# Patient Record
Sex: Female | Born: 1970 | Race: White | Hispanic: No | Marital: Married | Smoking: Never smoker
Health system: Southern US, Community
[De-identification: ages and names within clinical notes are randomized; demographics above are authoritative.]

## PROBLEM LIST (undated history)

## (undated) DIAGNOSIS — K219 Gastro-esophageal reflux disease without esophagitis: Secondary | ICD-10-CM

## (undated) DIAGNOSIS — G4730 Sleep apnea, unspecified: Secondary | ICD-10-CM

## (undated) DIAGNOSIS — M5136 Other intervertebral disc degeneration, lumbar region: Secondary | ICD-10-CM

## (undated) DIAGNOSIS — G8929 Other chronic pain: Secondary | ICD-10-CM

## (undated) DIAGNOSIS — B9689 Other specified bacterial agents as the cause of diseases classified elsewhere: Secondary | ICD-10-CM

## (undated) DIAGNOSIS — Z87828 Personal history of other (healed) physical injury and trauma: Secondary | ICD-10-CM

## (undated) DIAGNOSIS — M51369 Other intervertebral disc degeneration, lumbar region without mention of lumbar back pain or lower extremity pain: Secondary | ICD-10-CM

## (undated) DIAGNOSIS — I10 Essential (primary) hypertension: Secondary | ICD-10-CM

## (undated) DIAGNOSIS — M1990 Unspecified osteoarthritis, unspecified site: Secondary | ICD-10-CM

## (undated) DIAGNOSIS — F419 Anxiety disorder, unspecified: Secondary | ICD-10-CM

## (undated) DIAGNOSIS — E785 Hyperlipidemia, unspecified: Secondary | ICD-10-CM

## (undated) DIAGNOSIS — R87619 Unspecified abnormal cytological findings in specimens from cervix uteri: Secondary | ICD-10-CM

## (undated) DIAGNOSIS — F410 Panic disorder [episodic paroxysmal anxiety] without agoraphobia: Secondary | ICD-10-CM

## (undated) DIAGNOSIS — E119 Type 2 diabetes mellitus without complications: Secondary | ICD-10-CM

## (undated) DIAGNOSIS — N760 Acute vaginitis: Secondary | ICD-10-CM

## (undated) DIAGNOSIS — F319 Bipolar disorder, unspecified: Secondary | ICD-10-CM

## (undated) DIAGNOSIS — N840 Polyp of corpus uteri: Secondary | ICD-10-CM

## (undated) DIAGNOSIS — IMO0002 Reserved for concepts with insufficient information to code with codable children: Secondary | ICD-10-CM

## (undated) DIAGNOSIS — M549 Dorsalgia, unspecified: Secondary | ICD-10-CM

## (undated) DIAGNOSIS — F603 Borderline personality disorder: Secondary | ICD-10-CM

## (undated) DIAGNOSIS — I509 Heart failure, unspecified: Secondary | ICD-10-CM

## (undated) HISTORY — PX: CHOLECYSTECTOMY: SHX55

## (undated) HISTORY — DX: Other specified bacterial agents as the cause of diseases classified elsewhere: N76.0

## (undated) HISTORY — DX: Bipolar disorder, unspecified: F31.9

## (undated) HISTORY — DX: Borderline personality disorder: F60.3

## (undated) HISTORY — DX: Essential (primary) hypertension: I10

## (undated) HISTORY — PX: DILATION AND CURETTAGE OF UTERUS: SHX78

## (undated) HISTORY — DX: Heart failure, unspecified: I50.9

## (undated) HISTORY — DX: Polyp of corpus uteri: N84.0

## (undated) HISTORY — DX: Reserved for concepts with insufficient information to code with codable children: IMO0002

## (undated) HISTORY — DX: Type 2 diabetes mellitus without complications: E11.9

## (undated) HISTORY — PX: FRACTURE SURGERY: SHX138

## (undated) HISTORY — DX: Personal history of other (healed) physical injury and trauma: Z87.828

## (undated) HISTORY — PX: HARDWARE REMOVAL: SHX979

## (undated) HISTORY — DX: Other specified bacterial agents as the cause of diseases classified elsewhere: B96.89

## (undated) HISTORY — PX: APPENDECTOMY: SHX54

## (undated) HISTORY — DX: Panic disorder (episodic paroxysmal anxiety): F41.0

## (undated) HISTORY — DX: Unspecified abnormal cytological findings in specimens from cervix uteri: R87.619

## (undated) HISTORY — DX: Hyperlipidemia, unspecified: E78.5

---

## 2003-05-07 ENCOUNTER — Encounter: Payer: Self-pay | Admitting: Emergency Medicine

## 2003-05-07 ENCOUNTER — Emergency Department (HOSPITAL_COMMUNITY): Admission: EM | Admit: 2003-05-07 | Discharge: 2003-05-07 | Payer: Self-pay | Admitting: Internal Medicine

## 2003-05-07 ENCOUNTER — Emergency Department (HOSPITAL_COMMUNITY): Admission: EM | Admit: 2003-05-07 | Discharge: 2003-05-07 | Payer: Self-pay | Admitting: Emergency Medicine

## 2003-05-09 ENCOUNTER — Ambulatory Visit (HOSPITAL_COMMUNITY): Admission: RE | Admit: 2003-05-09 | Discharge: 2003-05-09 | Payer: Self-pay | Admitting: Emergency Medicine

## 2003-05-09 ENCOUNTER — Encounter: Payer: Self-pay | Admitting: Emergency Medicine

## 2003-05-11 ENCOUNTER — Emergency Department (HOSPITAL_COMMUNITY): Admission: EM | Admit: 2003-05-11 | Discharge: 2003-05-12 | Payer: Self-pay | Admitting: *Deleted

## 2003-07-14 ENCOUNTER — Inpatient Hospital Stay (HOSPITAL_COMMUNITY): Admission: AC | Admit: 2003-07-14 | Discharge: 2003-07-15 | Payer: Self-pay

## 2003-07-14 ENCOUNTER — Encounter: Payer: Self-pay | Admitting: Orthopedic Surgery

## 2003-07-14 ENCOUNTER — Encounter: Payer: Self-pay | Admitting: Emergency Medicine

## 2003-07-14 ENCOUNTER — Encounter: Payer: Self-pay | Admitting: General Surgery

## 2003-07-27 ENCOUNTER — Ambulatory Visit (HOSPITAL_COMMUNITY): Admission: RE | Admit: 2003-07-27 | Discharge: 2003-07-28 | Payer: Self-pay | Admitting: Orthopedic Surgery

## 2003-07-27 ENCOUNTER — Encounter: Payer: Self-pay | Admitting: Orthopedic Surgery

## 2003-08-09 ENCOUNTER — Ambulatory Visit (HOSPITAL_COMMUNITY): Admission: RE | Admit: 2003-08-09 | Discharge: 2003-08-09 | Payer: Self-pay | Admitting: Family Medicine

## 2003-08-09 ENCOUNTER — Encounter: Payer: Self-pay | Admitting: Family Medicine

## 2004-01-19 ENCOUNTER — Ambulatory Visit (HOSPITAL_COMMUNITY): Admission: RE | Admit: 2004-01-19 | Discharge: 2004-01-19 | Payer: Self-pay | Admitting: Family Medicine

## 2004-01-27 ENCOUNTER — Ambulatory Visit (HOSPITAL_COMMUNITY): Admission: RE | Admit: 2004-01-27 | Discharge: 2004-01-27 | Payer: Self-pay | Admitting: General Surgery

## 2004-03-20 ENCOUNTER — Ambulatory Visit (HOSPITAL_COMMUNITY): Admission: RE | Admit: 2004-03-20 | Discharge: 2004-03-20 | Payer: Self-pay | Admitting: Orthopedic Surgery

## 2004-06-01 ENCOUNTER — Ambulatory Visit (HOSPITAL_COMMUNITY): Admission: RE | Admit: 2004-06-01 | Discharge: 2004-06-01 | Payer: Self-pay | Admitting: Family Medicine

## 2004-10-09 ENCOUNTER — Ambulatory Visit: Payer: Self-pay | Admitting: Urgent Care

## 2004-10-16 ENCOUNTER — Ambulatory Visit: Payer: Self-pay | Admitting: Internal Medicine

## 2004-10-16 ENCOUNTER — Ambulatory Visit (HOSPITAL_COMMUNITY): Admission: RE | Admit: 2004-10-16 | Discharge: 2004-10-16 | Payer: Self-pay | Admitting: Internal Medicine

## 2004-12-20 ENCOUNTER — Ambulatory Visit (HOSPITAL_COMMUNITY): Admission: RE | Admit: 2004-12-20 | Discharge: 2004-12-20 | Payer: Self-pay | Admitting: *Deleted

## 2005-01-19 ENCOUNTER — Emergency Department (HOSPITAL_COMMUNITY): Admission: EM | Admit: 2005-01-19 | Discharge: 2005-01-19 | Payer: Self-pay | Admitting: Emergency Medicine

## 2005-03-05 ENCOUNTER — Ambulatory Visit (HOSPITAL_COMMUNITY): Admission: RE | Admit: 2005-03-05 | Discharge: 2005-03-05 | Payer: Self-pay | Admitting: Family Medicine

## 2005-03-25 ENCOUNTER — Ambulatory Visit (HOSPITAL_COMMUNITY): Admission: RE | Admit: 2005-03-25 | Discharge: 2005-03-25 | Payer: Self-pay | Admitting: Family Medicine

## 2005-04-10 ENCOUNTER — Ambulatory Visit: Payer: Self-pay | Admitting: Internal Medicine

## 2005-11-07 ENCOUNTER — Ambulatory Visit (HOSPITAL_COMMUNITY): Admission: RE | Admit: 2005-11-07 | Discharge: 2005-11-07 | Payer: Self-pay | Admitting: *Deleted

## 2005-11-27 ENCOUNTER — Ambulatory Visit: Payer: Self-pay | Admitting: *Deleted

## 2005-12-12 ENCOUNTER — Ambulatory Visit: Payer: Self-pay | Admitting: Family Medicine

## 2005-12-18 ENCOUNTER — Ambulatory Visit: Payer: Self-pay | Admitting: Obstetrics & Gynecology

## 2005-12-24 ENCOUNTER — Ambulatory Visit (HOSPITAL_COMMUNITY): Admission: RE | Admit: 2005-12-24 | Discharge: 2005-12-24 | Payer: Self-pay | Admitting: *Deleted

## 2006-01-08 ENCOUNTER — Ambulatory Visit: Payer: Self-pay | Admitting: Obstetrics & Gynecology

## 2006-01-15 ENCOUNTER — Ambulatory Visit (HOSPITAL_COMMUNITY): Admission: RE | Admit: 2006-01-15 | Discharge: 2006-01-15 | Payer: Self-pay | Admitting: *Deleted

## 2006-01-15 ENCOUNTER — Ambulatory Visit: Payer: Self-pay | Admitting: Obstetrics & Gynecology

## 2006-01-18 ENCOUNTER — Inpatient Hospital Stay (HOSPITAL_COMMUNITY): Admission: AD | Admit: 2006-01-18 | Discharge: 2006-01-19 | Payer: Self-pay | Admitting: *Deleted

## 2006-01-18 ENCOUNTER — Encounter: Payer: Self-pay | Admitting: Emergency Medicine

## 2006-01-18 ENCOUNTER — Ambulatory Visit: Payer: Self-pay | Admitting: Certified Nurse Midwife

## 2006-01-28 ENCOUNTER — Encounter: Admission: RE | Admit: 2006-01-28 | Discharge: 2006-01-28 | Payer: Self-pay | Admitting: Orthopedic Surgery

## 2006-01-30 ENCOUNTER — Ambulatory Visit: Payer: Self-pay | Admitting: Family Medicine

## 2006-01-30 ENCOUNTER — Ambulatory Visit (HOSPITAL_COMMUNITY): Admission: RE | Admit: 2006-01-30 | Discharge: 2006-01-30 | Payer: Self-pay | Admitting: *Deleted

## 2006-02-03 ENCOUNTER — Ambulatory Visit: Payer: Self-pay | Admitting: Obstetrics & Gynecology

## 2006-02-03 ENCOUNTER — Ambulatory Visit (HOSPITAL_COMMUNITY): Admission: RE | Admit: 2006-02-03 | Discharge: 2006-02-03 | Payer: Self-pay | Admitting: *Deleted

## 2006-02-10 ENCOUNTER — Ambulatory Visit (HOSPITAL_COMMUNITY): Admission: RE | Admit: 2006-02-10 | Discharge: 2006-02-10 | Payer: Self-pay | Admitting: *Deleted

## 2006-02-10 ENCOUNTER — Ambulatory Visit: Payer: Self-pay | Admitting: Family Medicine

## 2006-02-17 ENCOUNTER — Ambulatory Visit (HOSPITAL_COMMUNITY): Admission: RE | Admit: 2006-02-17 | Discharge: 2006-02-17 | Payer: Self-pay | Admitting: *Deleted

## 2006-02-17 ENCOUNTER — Ambulatory Visit: Payer: Self-pay | Admitting: Family Medicine

## 2006-02-20 ENCOUNTER — Inpatient Hospital Stay (HOSPITAL_COMMUNITY): Admission: AD | Admit: 2006-02-20 | Discharge: 2006-02-27 | Payer: Self-pay | Admitting: Family Medicine

## 2006-02-20 ENCOUNTER — Ambulatory Visit: Payer: Self-pay | Admitting: Obstetrics & Gynecology

## 2006-02-20 ENCOUNTER — Ambulatory Visit: Payer: Self-pay | Admitting: Family Medicine

## 2006-02-23 ENCOUNTER — Ambulatory Visit: Payer: Self-pay | Admitting: Pediatrics

## 2006-02-24 ENCOUNTER — Encounter (INDEPENDENT_AMBULATORY_CARE_PROVIDER_SITE_OTHER): Payer: Self-pay | Admitting: *Deleted

## 2006-03-03 ENCOUNTER — Ambulatory Visit: Payer: Self-pay | Admitting: *Deleted

## 2006-03-17 ENCOUNTER — Ambulatory Visit: Payer: Self-pay | Admitting: *Deleted

## 2007-11-28 IMAGING — US US OB COMP +14 WK
1 series · 13 of 28 positions shown · non-contrast
Comparison: none

CLINICAL DATA: Diabetes.  Morphology.

[Series 1: unknown · 0.26mm/px · 13 of 44 slices shown]
[im 2/44]
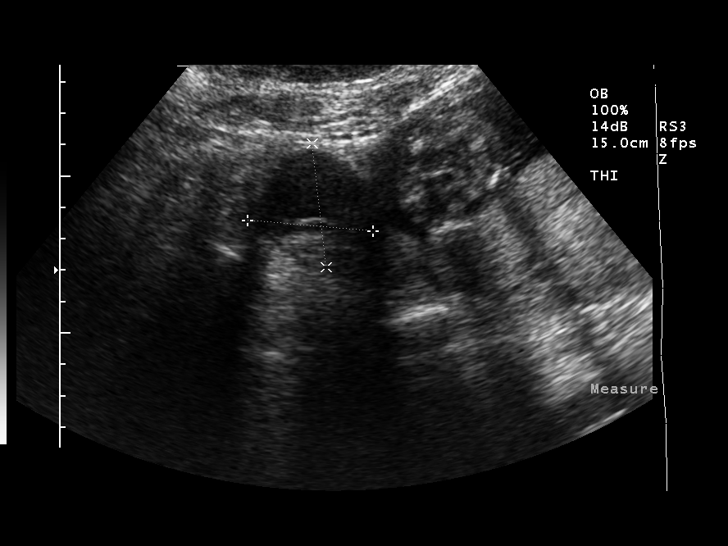
[im 5/44]
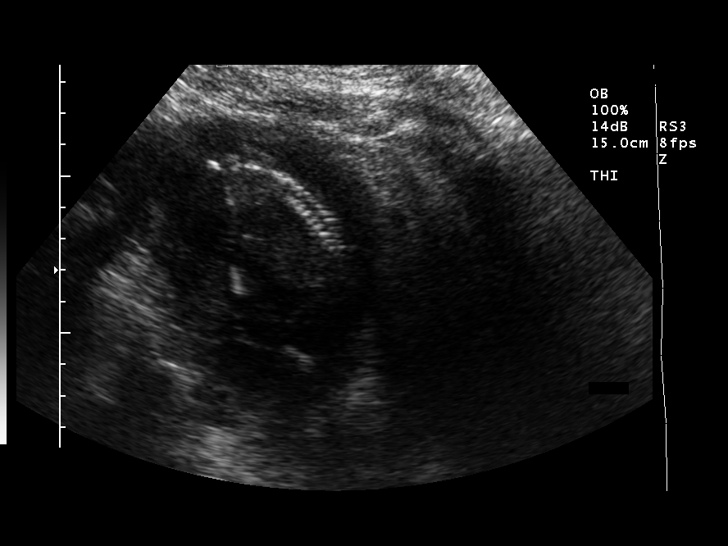
[im 8/44]
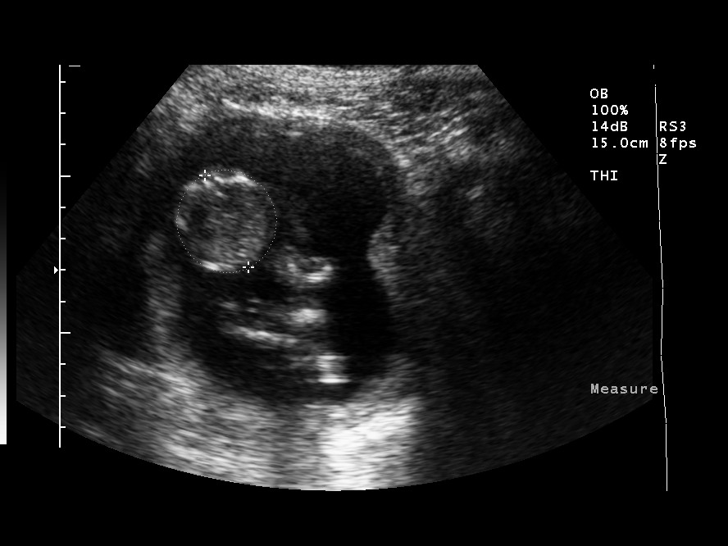
[im 12/44]
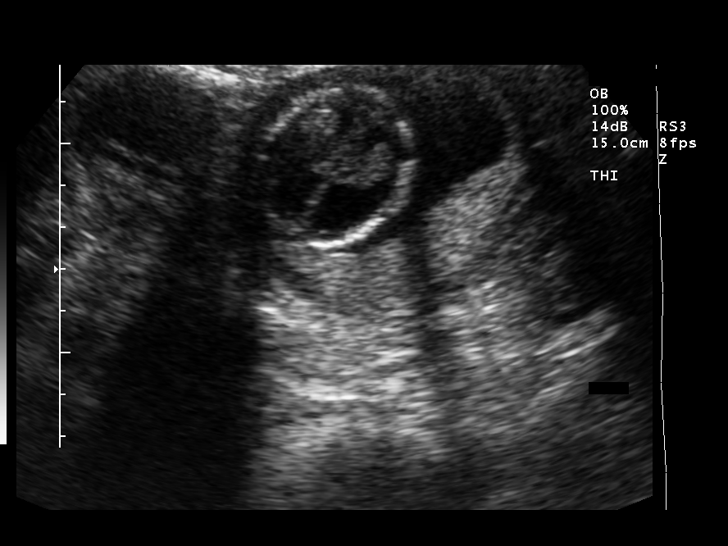
[im 15/44]
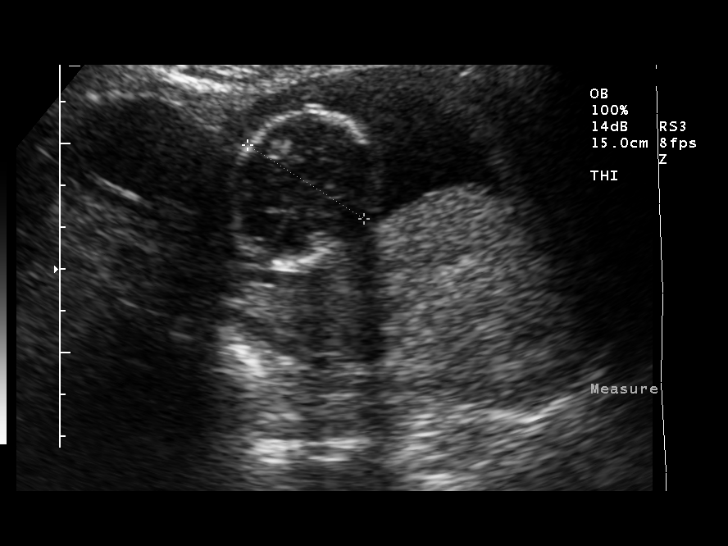
[im 18/44]
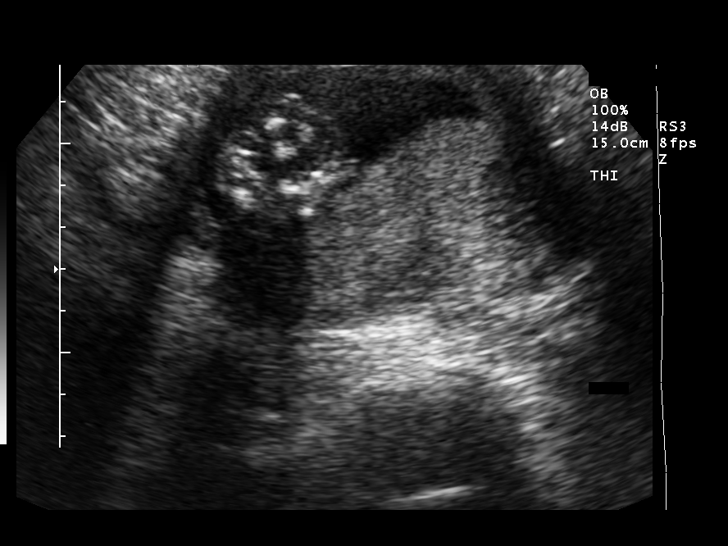
[im 23/44]
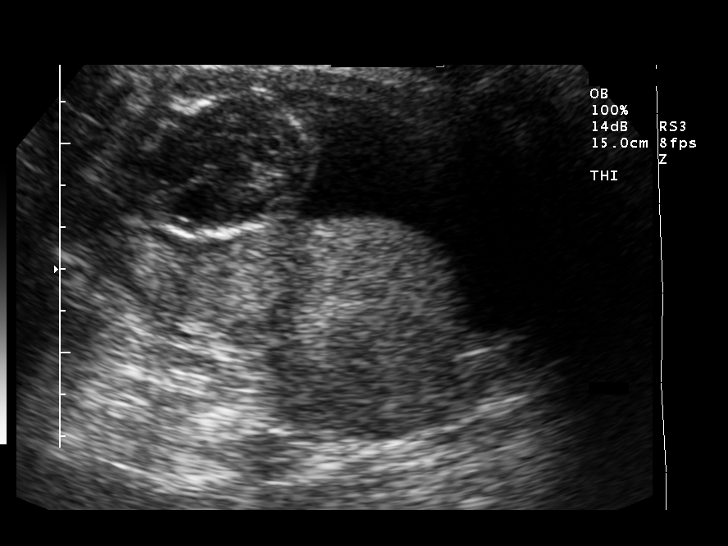
[im 26/44]
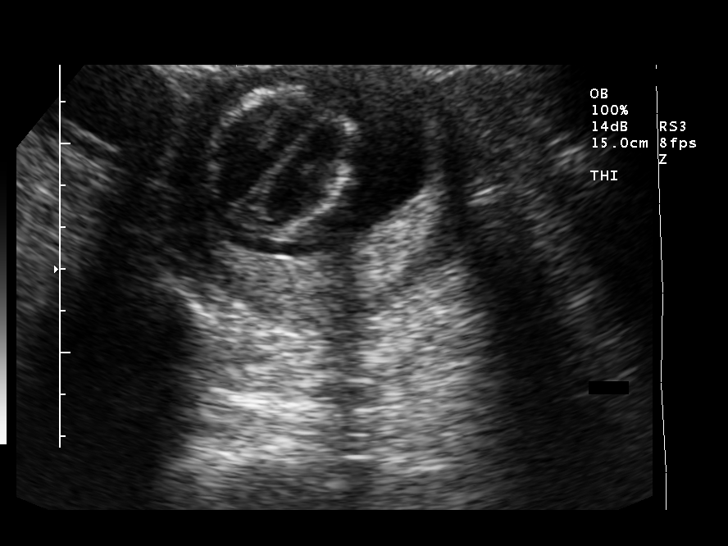
[im 29/44]
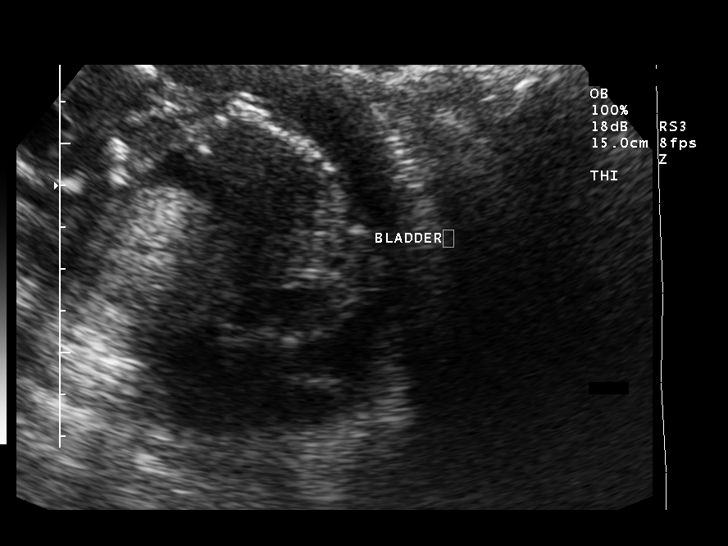
[im 32/44]
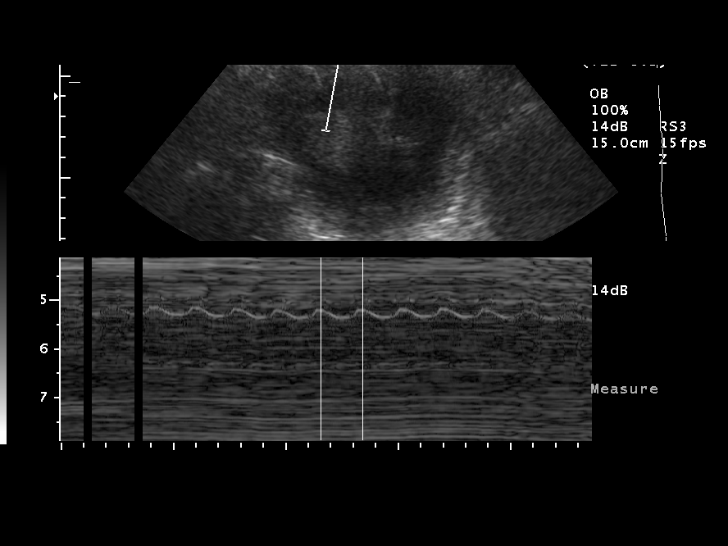
[im 36/44]
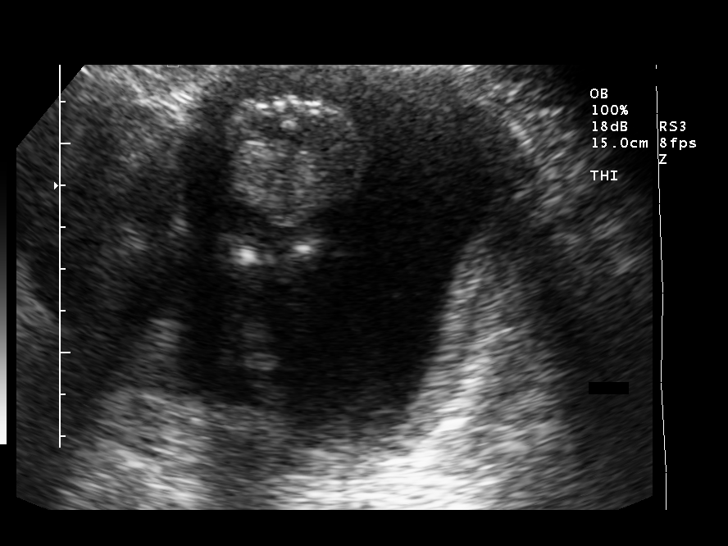
[im 39/44]
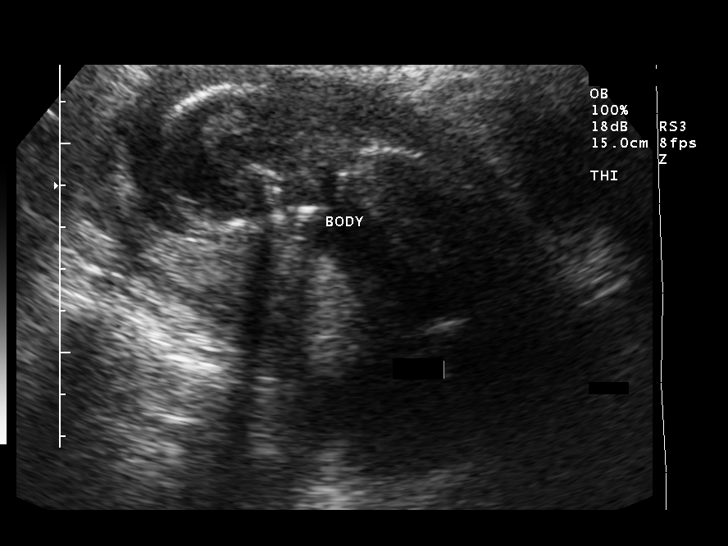
[im 42/44]
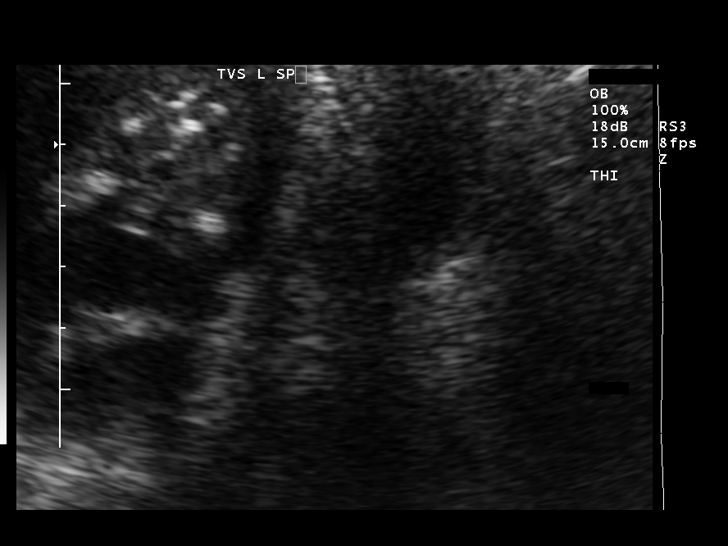

[13 of 28 positions shown; findings below may reference images not displayed]

OBSTETRICAL ULTRASOUND:
 Number of Fetuses:  1
 Heart Rate:  166
 Movement:  Yes
 Breathing:  No  
 Presentation:  Breech
 Placental Location:  Posterior
 Previa:  No
 Amniotic Fluid (Subjective):  Normal
 Amniotic Fluid (Objective):   7.3 cm Vertical pocket 

 FETAL BIOMETRY
 BPD:   3.2 cm   16 w 2 d
 HC:   12.1 cm   16 w 0 d
 AC:    9.9 cm  16 w 0 d
 FL:    2.0 cm  16 w 0 d

 MEAN GA:  16 w 1 d

 FETAL ANATOMY
 Lateral Ventricles:    Visualized 
 Thalami/CSP:      Visualized 
 Posterior Fossa:  Not visualized 
 Nuchal Region:    Not visualized 
 Spine:      Visualized 
 4 Chamber Heart on Left:      Not visualized 
 Stomach on Left:      Visualized 
 3 Vessel Cord:    Not visualized 
 Cord Insertion site:    Not visualized 
 Kidneys:  Visualized 
 Bladder:  Visualized 
 Extremities:      Not visualized 

 Evaluation limited by:  Maternal habitus, fetal position and early gestational age.

 MATERNAL UTERINE AND ADNEXAL FINDINGS
 Cervix: 3.3 cm Transabdominally
IMPRESSION: 1.  Single live intrauterine gestation in breech presentation.  Limited incomplete morphologic survey due to early gestational age, maternal body habitus, and fetal position.
 2.  Recommend follow-up ultrasound in four weeks to reassess fetal anatomy.
 3.  Bowel is upper normal to questioned minimally increased in echogenicity, can assess at follow-up study.  
 4.  Corpus luteal cyst right ovary 3.4 cm diameter.
 5.  Posterior placenta without previa.  
 6.  Contraction noted in the posterior wall of uterus during study.

## 2007-12-17 ENCOUNTER — Inpatient Hospital Stay (HOSPITAL_COMMUNITY): Admission: EM | Admit: 2007-12-17 | Discharge: 2007-12-19 | Payer: Self-pay | Admitting: Emergency Medicine

## 2008-04-01 ENCOUNTER — Other Ambulatory Visit: Admission: RE | Admit: 2008-04-01 | Discharge: 2008-04-01 | Payer: Self-pay | Admitting: Obstetrics and Gynecology

## 2008-11-15 ENCOUNTER — Other Ambulatory Visit: Admission: RE | Admit: 2008-11-15 | Discharge: 2008-11-15 | Payer: Self-pay | Admitting: Obstetrics and Gynecology

## 2010-12-22 ENCOUNTER — Encounter: Payer: Self-pay | Admitting: Family Medicine

## 2010-12-23 ENCOUNTER — Encounter: Payer: Self-pay | Admitting: *Deleted

## 2011-04-09 ENCOUNTER — Emergency Department (HOSPITAL_COMMUNITY): Payer: Self-pay

## 2011-04-09 ENCOUNTER — Emergency Department (HOSPITAL_COMMUNITY)
Admission: EM | Admit: 2011-04-09 | Discharge: 2011-04-09 | Disposition: A | Discharge status: Nursing Home (Includes ICF) | Payer: Self-pay | Attending: Emergency Medicine | Admitting: Emergency Medicine

## 2011-04-09 DIAGNOSIS — S5010XA Contusion of unspecified forearm, initial encounter: Secondary | ICD-10-CM | POA: Insufficient documentation

## 2011-04-09 DIAGNOSIS — K219 Gastro-esophageal reflux disease without esophagitis: Secondary | ICD-10-CM | POA: Insufficient documentation

## 2011-04-09 DIAGNOSIS — E7800 Pure hypercholesterolemia, unspecified: Secondary | ICD-10-CM | POA: Insufficient documentation

## 2011-04-09 DIAGNOSIS — F411 Generalized anxiety disorder: Secondary | ICD-10-CM | POA: Insufficient documentation

## 2011-04-09 DIAGNOSIS — S52209A Unspecified fracture of shaft of unspecified ulna, initial encounter for closed fracture: Secondary | ICD-10-CM | POA: Insufficient documentation

## 2011-04-09 DIAGNOSIS — S2249XA Multiple fractures of ribs, unspecified side, initial encounter for closed fracture: Secondary | ICD-10-CM | POA: Insufficient documentation

## 2011-04-09 DIAGNOSIS — E119 Type 2 diabetes mellitus without complications: Secondary | ICD-10-CM | POA: Insufficient documentation

## 2011-04-09 DIAGNOSIS — Z794 Long term (current) use of insulin: Secondary | ICD-10-CM | POA: Insufficient documentation

## 2011-04-09 DIAGNOSIS — S42413A Displaced simple supracondylar fracture without intercondylar fracture of unspecified humerus, initial encounter for closed fracture: Secondary | ICD-10-CM | POA: Insufficient documentation

## 2011-04-09 DIAGNOSIS — IMO0002 Reserved for concepts with insufficient information to code with codable children: Secondary | ICD-10-CM | POA: Insufficient documentation

## 2011-04-09 DIAGNOSIS — S82109A Unspecified fracture of upper end of unspecified tibia, initial encounter for closed fracture: Secondary | ICD-10-CM | POA: Insufficient documentation

## 2011-04-09 DIAGNOSIS — M79609 Pain in unspecified limb: Secondary | ICD-10-CM | POA: Insufficient documentation

## 2011-04-09 DIAGNOSIS — G319 Degenerative disease of nervous system, unspecified: Secondary | ICD-10-CM | POA: Insufficient documentation

## 2011-04-09 DIAGNOSIS — S72499A Other fracture of lower end of unspecified femur, initial encounter for closed fracture: Secondary | ICD-10-CM | POA: Insufficient documentation

## 2011-04-09 DIAGNOSIS — I10 Essential (primary) hypertension: Secondary | ICD-10-CM | POA: Insufficient documentation

## 2011-04-09 DIAGNOSIS — S82409A Unspecified fracture of shaft of unspecified fibula, initial encounter for closed fracture: Secondary | ICD-10-CM | POA: Insufficient documentation

## 2011-04-09 DIAGNOSIS — M7989 Other specified soft tissue disorders: Secondary | ICD-10-CM | POA: Insufficient documentation

## 2011-04-09 DIAGNOSIS — S82843A Displaced bimalleolar fracture of unspecified lower leg, initial encounter for closed fracture: Secondary | ICD-10-CM | POA: Insufficient documentation

## 2011-04-09 DIAGNOSIS — IMO0001 Reserved for inherently not codable concepts without codable children: Secondary | ICD-10-CM | POA: Insufficient documentation

## 2011-04-09 LAB — COMPREHENSIVE METABOLIC PANEL
BUN: 17 mg/dL (ref 6–23)
Chloride: 94 mEq/L — ABNORMAL LOW (ref 96–112)
GFR calc Af Amer: >60 mL/min (ref >60)
Potassium: 4.5 mEq/L (ref 3.5–5.1)

## 2011-04-09 LAB — PROTIME-INR
INR: 0.93 (ref 0.00–1.49)
Prothrombin Time: 12.7 seconds (ref 11.6–15.2)

## 2011-04-09 LAB — POCT I-STAT, CHEM 8
BUN: 18 mg/dL (ref 6–23)
Glucose, Bld: 580 mg/dL (ref 70–99)
HCT: 42 % (ref 36.0–46.0)
Hemoglobin: 14.3 g/dL (ref 12.0–15.0)
Sodium: 131 mEq/L — ABNORMAL LOW (ref 135–145)
TCO2: 23 mmol/L (ref 0–100)

## 2011-04-09 LAB — GLUCOSE, CAPILLARY

## 2011-04-09 LAB — CBC
Hemoglobin: 13.6 g/dL (ref 12.0–15.0)
RBC: 4.79 MIL/uL (ref 3.87–5.11)
WBC: 22.2 10*3/uL — ABNORMAL HIGH (ref 4.0–10.5)

## 2011-04-09 LAB — LACTIC ACID, PLASMA: Lactic Acid, Venous: 4.3 mmol/L — ABNORMAL HIGH (ref 0.5–2.2)

## 2011-04-09 LAB — SAMPLE TO BLOOD BANK

## 2011-04-09 MED ORDER — IOHEXOL 300 MG/ML  SOLN
100.0000 mL | Freq: Once | INTRAMUSCULAR | Status: AC | PRN
  Administered 2011-04-09: 100 mL via INTRAVENOUS

## 2011-04-11 LAB — GLUCOSE, CAPILLARY: Glucose-Capillary: 420 mg/dL — ABNORMAL HIGH (ref 70–99)
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
## 2011-04-16 NOTE — Group Therapy Note (Signed)
NAMETATJANA, Yolanda [name]                ACCOUNT NO.:  [id number]   MEDICAL RECORD NO.:  [id number]          PATIENT TYPE:  INP   LOCATION:  A208                          FACILITY:  [facility]   PHYSICIAN:  Skeet Latch, DO    DATE OF BIRTH:  1971/11/13   DATE OF PROCEDURE:  12/18/2007  DATE OF DISCHARGE:                                 PROGRESS NOTE   SUBJECTIVE:  Yolanda [name] states that she is feeling better today.  Patient is not complaining about any dysuria or abdominal pain or nausea  or vomiting.  Patient is sitting in bed, comfortable, in no acute  distress.   OBJECTIVE:  VITAL SIGNS:  Temperature is 101.8, pulse 122, respirations  20, blood pressure is 116/67.  She is satting 94% on room air.   PHYSICAL EXAM:  GENERAL:  She is a 40 year old Caucasian female, well-  developed, well-nourished, well-hydrated, in no acute distress.  She is  alert and oriented times three, pleasant, cooperative.  CARDIOVASCULAR:  She is tachycardic, no murmurs appreciated, normal S1,  S2.  LUNGS:  Clear to auscultation bilaterally, no rales, rhonchi or  wheezing.  ABDOMEN:  Obese, soft, slightly tender in mid-abdomen, no rigidity or  guarding.  EXTREMITIES:  No clubbing, cyanosis or edema.   LABORATORIES:  White count is 10.4, hemoglobin 10.2, hematocrit 30.3,  platelets 209.  Sodium 137, potassium 3.3, chloride is 103, CO2 is 24  and glucose 75, BUN 40, creatinine is 1.25.   ASSESSMENT:  1. Urinary tract infection with possible sepsis.  2. Acute renal failure, secondary to dehydration.  3. Hypokalemia.  4. Sleep apnea.  5. Diabetes.   PLAN:  1. For UTI, continue patient on IV ceftriaxone at this time, pending      culture results.  2. For acute renal failure, seems to be improving.  Probably secondary      to dehydration.  Continue with IV hydration at this time.  3. For hypokalemia, she is slightly improved today; will continue to      replace.  4. For sleep apnea, patient is on CPAP at  night.  5. For diabetes, patient is on a sliding scale, as well as Lantus.  We      will still hold her Actos and her home oral diabetic medication      until patient has regular diabetic diet.   Anticipate patient's being discharged in the next few days, if she is  stable and afebrile.      Skeet Latch, DO  Electronically Signed     SM/MEDQ  D:  12/18/2007  T:  12/18/2007  Job:  [id number]

## 2011-04-16 NOTE — Discharge Summary (Signed)
Yolanda [name], Yolanda [name]                ACCOUNT NO.:  [id number]   MEDICAL RECORD NO.:  [id number]          PATIENT TYPE:  INP   LOCATION:  A208                          FACILITY:  [facility]   PHYSICIAN:  Dorris Singh, DO    DATE OF BIRTH:  1971/10/06   DATE OF ADMISSION:  12/17/2007  DATE OF DISCHARGE:  01/17/2009LH                               DISCHARGE SUMMARY   ADMISSION DIAGNOSES:  1. Urinary tract infection with sepsis and potential septic shock.  2. Insulin-dependent diabetes.  3. Morbid obesity.  4. History of hypertension.  5. History of sleep apnea.   DISCHARGE DIAGNOSES:  1. Urinary tract infection, which is resolving.  2. Insulin-dependent diabetes.  3. Morbid obesity.  4. History of hypertension.  5. History of sleep apnea.   There were no consults made.   PRIMARY CARE PHYSICIAN:  Dr. Nobie Putnam   RADIOLOGY REPORTS:  On January 16, a CT of abdomen and pelvis:  CT is  unremarkable of the pelvis and CT of the abdomen is unremarkable.   Her history and physical was done by Dr. Rito Ehrlich.  To summarize, this  is a 40 year old Caucasian female who noticed increase of urine with  pain on urination and abdominal pain.  She was then brought to [facility] where she was evaluated and found to have a fever after 3 days of  poor oral intake and pyrexia, and she was found to be hypotensive and  tachycardic.  The patient was found to have a urinary tract infection.  Also, it was significant to note that she is allergic to CIPRO and  SULFA.  At that point in time she was started on IV hydration and a CT  was obtained.  They put her on CPAP at night and they continued her on  her Lantus and sliding scale and checked her hemoglobin A1c, and took  her off of her Lantus.  The patient continued to improve without any  problems or difficulties.  Her white count trended down.  Also, she  maintained on IV hydration and on January 17, the patient was seen today  eating, stating that she  feels well, would like to go home.  She was  seen in the room with her husband and her child.  On physical exam, her  symptoms are improved, her vital signs are stable.  Her labs are  reviewed.  Her white count is normal and her kidney function is normal.  At this point in time the patient will be discharged.  Her condition is  stable.   She will be discharged to home on the following medications:  1. Lantus 87 units subcu two times daily.  2. NovoLog sliding scale.  3. Actos 45 mg daily.  4. Lisinopril, no dose given.  5. Lipitor, no dose given.  6. Prevacid, no dose given.  7. Aspirin 81 mg.  8. Fish oil.   Her new medication will be Macrodantin 100 mg p.o. q.6h. x7 days.   She is to follow up with Dr. Nobie Putnam in 3-5 days and she is to increase  her fluid.  She is to continue on a diabetic diet and take the  medication as recommended and increase her activity as tolerated.      Dorris Singh, DO  Electronically Signed     CB/MEDQ  D:  12/19/2007  T:  12/19/2007  Job:  [id number]
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
## 2011-04-19 NOTE — Op Note (Signed)
Yolanda [name], Yolanda [name]                ACCOUNT NO.:  [id number]   MEDICAL RECORD NO.:  [id number]          PATIENT TYPE:  AMB   LOCATION:  DAY                           FACILITY:  [facility]   PHYSICIAN:  R. Roetta Sessions, M.D. DATE OF BIRTH:  06-04-1971   DATE OF PROCEDURE:  10/16/2004  DATE OF DISCHARGE:                                 OPERATIVE REPORT   PROCEDURE:  Colonoscopy, ileoscopy, with diagnostic stool sampling.   INDICATION FOR PROCEDURE:  The patient is a 40 year old lady with diarrhea  and Hemoccult-positive stools.  Colonoscopy is now being done.  The approach  has been discussed with the patient at length.  Potential risks, benefits,  and alternatives have been reviewed.  Please see dictated consultation note  for more information.   PROCEDURE NOTE:  O2 saturation, blood pressure, pulse, and respirations were  monitored throughout the entire procedure.   CONSCIOUS SEDATION:  Versed 4 mg IV, Demerol 75 mg IV in divided doses.   INSTRUMENT USED:  Olympus video chip system.   FINDINGS:  Digital exam revealed no abnormalities.   Endoscopic findings:  Prep was good.   Rectum:  Examination of the rectal mucosa including retroflexed view of the  anal verge revealed only some internal hemorrhoids.  The rectal mucosa  appeared normal.   Colon:  The colonic mucosa was surveyed from the rectosigmoid junction  through the left, transverse, and right colon to the area of the appendiceal  orifice, ileocecal valve, and cecum.  These structures were well-seen and  photographed for the record.  The terminal ileum was then intubated to 10  cm.  Please see photos.  From this level the scope was slowly withdrawn.  All previously-mentioned mucosal surfaces were again seen.  The colonic  mucosa appeared normal.  Stool samples taken for microbiology studies.  The  patient tolerated the procedure well, was reacted in endoscopy.   IMPRESSION:  Internal hemorrhoids, otherwise normal rectum,  normal colon,  normal terminal ileum.  Stool sample taken.   RECOMMENDATIONS:  1.  Hemorrhoid literature provided to [name].  2.  A 10-day course of Anusol-HC suppositories one per rectum at bedtime.  3.  Follow up on stool studies.  4.  Further recommendations to follow.     Otelia Sergeant   RMR/MEDQ  D:  10/16/2004  T:  10/17/2004  Job:  [id number]   cc:   Patrica Duel, M.D.  [address]  [place]  [place] [address]  Fax: [phone]

## 2011-04-19 NOTE — Op Note (Signed)
[name], Yolanda [name]                ACCOUNT NO.:  [id number]   MEDICAL RECORD NO.:  [id number]          PATIENT TYPE:  INP   LOCATION:  9303                          FACILITY:  [facility]   PHYSICIAN:  Conni Elliot, M.D.DATE OF BIRTH:  02-Sep-1971   DATE OF PROCEDURE:  02/24/2006  DATE OF DISCHARGE:                                 OPERATIVE REPORT   PREOPERATIVE DIAGNOSES:  1.  Thirty-one and four-sevenths weeks' intrauterine pregnancy.  2.  Severe preeclampsia.  3.  Breech presentation.   POSTOPERATIVE DIAGNOSES:  1.  Thirty-one and four-sevenths weeks' intrauterine pregnancy.  2.  Severe preeclampsia.  3.  Breech presentation.   PROCEDURE:  Primary low transverse cesarean section via Pfannenstiel.   SURGEON:  Conni Elliot, M.D.   ASSISTANTKaroline Caldwell B. Merlene Morse, M.D.   ANESTHESIA:  Spinal.   COMPLICATIONS:  None.   ESTIMATED BLOOD LOSS:  400 mL.   FLUIDS:  1200 mL of LR.   URINE OUTPUT:  100 mL of clear urine at the end of the procedure.   INDICATIONS:  A 40 year old G2, P0-0-1-0, at 31-4/7 weeks with severe  preeclampsia and breech presentation.   FINDINGS:  Female infant in breech presentation.  Pediatrics present at  delivery.  Apgars 7 and 9.  Weight 2 pounds 11 ounces.  Normal uterus,  tubes, and ovaries.  An omental-abdominal wall adhesion was reduced.   PROCEDURE:  The patient was taken to the operating room, where a spinal  anesthesia was obtained and found to be adequate.  She was then prepped and  draped in the normal sterile fashion in the dorsal supine position with a  leftward tilt.  A Pfannenstiel skin incision was then made with a scalpel  and carried to the underlying layer of fascia.  The fascia was incised in  the midline and the incision extended bilaterally with the Mayo scissors.  The superior aspect of the fascial incision was then grasped with a Kocher  clamp, elevated and the underlying layer was dissected off bluntly.  Attention was then  turned to the inferior aspect of the incision, which in a  similar fashion was grasped, tented up with a Kocher clamp, and the rectus  muscle was dissected off.  The rectus muscle was then separated in the  midline and the peritoneum was identified, tented up and entered sharply  with the Metzenbaum scissors.  Then the peritoneal incision was then  extended superiorly and inferiorly with good visualization of the bladder.  The bladder blade was then inserted and the vesicouterine peritoneum  identified, grasped with the pick-ups and entered sharply with the  Metzenbaum scissors.  The incision was then extended laterally and the  bladder flap created digitally.  The bladder blade was then reinserted and  the lower uterine segment incised in a transverse fashion.  The uterine  incision was then extended laterally and the bladder blade was removed and  the infant was delivered via frank breech presentation, no head entrapment.  The nose and mouth were bulb-suctioned, cord clamped and cut, infant handed  off to the waiting pediatricians.  Cord blood was sent, cord pH was 7.33.  The placenta was then removed manually, uterus exteriorized and cleared of  all clots and debris.  The uterus was repaired in a running locked fashion.  A second layer of the same suture was used to imbricate the incision.  The  bladder flap was repaired with running suture, the uterus returned to the  abdomen, the gutters cleared of all clots.  The peritoneum was closed after  an omental-abdominal wall adhesion was lysed and tied off.  The fascia was  reapproximated in a running fashion and the subcutaneous tissue was  reapproximated.  The skin was closed with staples.  The patient tolerated  the procedure well.  The sponge, lap and needle count were correct x2.  Ancef 1 g was given at cord clamp.  The patient was taken to the recovery  room in stable condition.     ______________________________  August Saucer Merlene Morse,  MD    ______________________________  Conni Elliot, M.D.    ABC/MEDQ  D:  02/24/2006  T:  02/26/2006  Job:  [id number]

## 2011-04-19 NOTE — Discharge Summary (Signed)
NAMEBRYSON, PALEN                ACCOUNT NO.:  [id number]   MEDICAL RECORD NO.:  [id number]          PATIENT TYPE:  INP   LOCATION:  WOC                           FACILITY:  [facility]   PHYSICIAN:  Angie B. Merlene Morse, MD  DATE OF BIRTH:  [birth year]   DATE OF ADMISSION:  02/20/2006  DATE OF DISCHARGE:  02/27/2006                                 DISCHARGE SUMMARY   ADMITTING ATTENDING:  Dr. Shawnie Pons.   DISCHARGING ATTENDING:  Dr. Shawnie Pons.   ADMITTING DIAGNOSES:  1.  A 31-week intrauterine pregnancy.  2.  Chronic hypertension with probable superimposed preeclampsia.  3.  Sleep apnea.  4.  Type 2 diabetes.  5.  Abnormal Dopplers.  6.  Breech presentation.  7.  Decreased fetal growth.   BRIEF HISTORY AND PHYSICAL:  The patient is a 40 year old, G2, P0-0-1-0, at  69 and 1 by her LMP, who has a history of chronic hypertension, who had  elevated blood pressures above baseline in the clinic.  Blood pressures were  161/103, 171/98 and 171/100.  The patient also has a history of chronic  hypertension, sleep apnea, class B diabetes, obesity, bipolar, PCOS, fetus  falling off growth curve, abnormal Dopplers and a low AFI.  The patient's  ultrasound showed an estimated fetal weight of 10-25th percentile with  previous being 25-50th percentile, AFI of 9.4, umbilical artery Doppler 6,  normal MCA and BPP of 8/8.   MEDICATIONS:  Aldomet, labetalol, Prilosec and insulin.   ALLERGIES:  SULFA AND CIPRO.   OBSTETRIC HISTORY:  SAB in January 2006, missed, with a D&C.   MEDICAL HISTORY:  As above.  Recent right broken foot.   SURGERY:  D&C, appendectomy, cholecystectomy, left knee surgery and rotator  cuff surgery.   LABORATORY PANEL:  O positive, antibody negative, hemoglobin 12.6 and  platelets 273, rubella immune, hepatitis B negative, syphilis nonreactive  and HIV negative.   PHYSICAL EXAMINATION:  GENERAL:  Gravid female.  Right foot in walking boot.  Neurologically intact.   HOSPITAL COURSE:   The patient was admitted and she was kept on bedrest.  Her  blood pressures were monitored and remained controlled on her home  medications, and returned closer to the patient's baseline.  The patient did  have a 24-hour urine that had been done as an outpatient, which resulted  while in the hospital, and was found to be 120.  The patient had a second 24-  hour urine done while in the hospital, which showed a total protein of too  low to be calculated.  Ultrasound was repeated and her AFI was stable.  Her  umbilical artery Doppler improved.  The patient did receive 2 doses of  betamethasone.  On Monday, March 26, the patient complained of a headache  with visual changes.  So, at that point it was determined that the patient  needed to be delivered on the basis of severe preeclampsia.  Please see op  note for more information.  The patient remained stable after delivery.  Her  blood pressure was controlled with hydrochlorothiazide, lisinopril and  labetalol.  The  patient was DC'd home.  Insulin was cut approximately in  half and patient was started back on Glucophage and Glyburide.  The patient  was started on magnesium when she began complaining of a headache, and  patient was continued on magnesium for more than 24 hours postpartum, and  had good diuresis.   DISCHARGE INSTRUCTIONS:  The patient was discharged to home in stable  condition.  She was to follow up with Dr. Talmage Nap as scheduled and Dr. Nash Dimmer,  orthopedist, as scheduled.   DISCHARGE MEDICINES:  1.  Labetalol 100 twice a day.  2.  Hydrochlorothiazide 25 once a day.  3.  Lisinopril 20 once a day.  4.  Glyburide 5 twice a day.  5.  Glucophage 1000 twice a day.  6.  Prilosec 40 twice a day.  7.  Humulin-N 25 q.p.m. and 22 q.a.m.  8.  Sliding scale 1 unit of NovoLog insulin for 30 g of carb.   The patient is also to follow up with high risk clinic in 6 weeks.           ______________________________  August Saucer Merlene Morse,  MD     ABC/MEDQ  D:  02/27/2006  T:  02/27/2006  Job:  [id number]

## 2011-04-19 NOTE — Op Note (Signed)
NAME:  Yolanda [name], Yolanda [name]                          ACCOUNT NO.:  [id number]   MEDICAL RECORD NO.:  [id number]                   PATIENT TYPE:  AMB   LOCATION:  DAY                                  FACILITY:  [facility]   PHYSICIAN:  Dalia Heading, M.D.               DATE OF BIRTH:  04/18/1971   DATE OF PROCEDURE:  DATE OF DISCHARGE:                                 OPERATIVE REPORT   PREOPERATIVE DIAGNOSIS:  Chronic cholecystitis.   POSTOPERATIVE DIAGNOSIS:  Chronic cholecystitis.   PROCEDURE:  Laparoscopic cholecystectomy   SURGEON:  Dalia Heading, M.D.   ANESTHESIA:  General endotracheal.   INDICATIONS:  The patient is a 40 year old white female who presents with  chronic cholecystitis.  The risks and benefits of the procedure including  bleeding, infection, hepatobiliary injury, and the possibility of an open  procedure were fully explained to the patient, who gave informed consent.   DESCRIPTION OF PROCEDURE:  The patient was placed in the supine position.  After induction of general endotracheal anesthesia, the abdomen was prepped  and draped using the usual sterile technique with Betadine.  Surgical site  confirmation was performed.   An infraumbilical incision was made down to the fascia.  A Veress needle was  introduced into the abdominal cavity and confirmation of placement was done  using the saline drop test.  The abdomen was then insufflated to 16 mmHg  pressure.  An 11-mm trocar was introduced into the abdominal cavity under  direct visualization without difficulty.  The patient was placed in reverse  Trendelenburg position and an additional 11-mm trocar was placed in the  epigastric region and 5-mm trocars were placed in the right upper quadrant  and right flank regions. The liver was inspected and noted to be within  normal limits.   The gallbladder was retracted superiorly and laterally.  The dissection was  begun around the infundibulum of the gallbladder.  The  cystic duct was first  identified.  Its juncture to the infundibulum fully identified.  Endoclips  were placed proximally and distally on the cystic duct; and the cystic duct  was divided.  This was likewise done on the cystic artery.  The gallbladder  was then freed away from the gallbladder fossa using Bovie electrocautery.  The gallbladder was delivered through the epigastric trocar site using an  EndoCatch bag.  The gallbladder fossa was inspected and no abnormal bleeding  or bile leakage was noted.  Surgicel was placed in the gallbladder fossa.  All fluid and air were then evacuated from the abdominal cavity prior to  removal of the trocars.   All wounds were irrigated with normal saline.  All wounds were injected with  0.5% Sensorcaine.  The supraumbilical fascia was reapproximated using an #0  Vicryl interrupted suture.  All skin incisions were closed using staples.  Betadine ointment and dry sterile dressings were applied.   All tape  and needle counts correct at the end of the procedure.  The patient  was extubated in the operating room and went back to recovery room in awake  and stable condition.   COMPLICATIONS:  None.   SPECIMEN:  Gallbladder.   BLOOD LOSS:  Minimal.      ___________________________________________                                            Dalia Heading, M.D.   MAJ/MEDQ  D:  01/27/2004  T:  01/27/2004  Job:  [id number]   cc:   Dalia Heading, M.D.  [address], [place]  [place] [address]  Fax: [phone]   Patrica Duel, M.D.  [address]  [place]  [place] [address]  Fax: [phone]

## 2011-04-19 NOTE — Op Note (Signed)
NAME:  Yolanda [name], Yolanda [name]                          ACCOUNT NO.:  [id number]   MEDICAL RECORD NO.:  [id number]                   PATIENT TYPE:  AMB   LOCATION:  DAY                                  FACILITY:  [facility]   PHYSICIAN:  Almedia Balls. Ranell Patrick, M.D.              DATE OF BIRTH:  1971/04/16   DATE OF PROCEDURE:  03/20/2004  DATE OF DISCHARGE:                                 OPERATIVE REPORT   PREOPERATIVE DIAGNOSIS:  Right shoulder retained hardware.   POSTOPERATIVE DIAGNOSIS:  Right shoulder retained hardware.   PROCEDURE PERFORMED:  Right shoulder hardware removal, deep.   ATTENDING SURGEON:  Almedia Balls. Ranell Patrick, M.D.   ANESTHESIA:  General.   ESTIMATED BLOOD LOSS:  Minimal.   FLUID REPLACEMENT:  500 mL crystalloid.   INSTRUMENT COUNT:  Correct.   COMPLICATIONS:  None.  Perioperative antibiotics were given.   INDICATIONS:  The patient is a 40 year old female, who sustained a  comminuted and displaced proximal humerus fracture for which she was treated  with closed reduction and percutaneous cannulated screw fixation in the  past.  The patient has had an uneventful union of her fracture and presents  now with retained hardware that continues to be irritating her deltoid  insertion.  The patient desires hardware removal.  Risks and benefits were  discussed; the patient would like to proceed.  Informed consent obtained.   DESCRIPTION OF PROCEDURE:  After an adequate level of anesthesia was  achieved, the patient was positioned in the modified beach chair position.  Using a Schlein holder, all neurovascular structures were padded  appropriately.  The right shoulder was sterilely prepped and draped.  The  patient had two prior percutaneous stab incisions over the lateral deltoid.  These were extended in one incision measuring about 1.5 cm in length.  The  skin was preinfiltrated with 0.25% Marcaine with epinephrine.  __________  incision with a 10 blade scalpel and development of a  subcutaneous interval  using the Bovie electrocautery.  The deltoid was divided in line with its  fibers over the course of about 1.5 cm just large enough to get a finger in.  We then spread bluntly down using Mayo scissors in line with deltoid fibers  to the hardware which was palpable.  Two 4.5 cannulated screws were removed  using screwdriver without event.  The wounds were thoroughly irrigated using  double-antibiotic irrigation followed  by closure with 2-0 Vicryl for the deltoid interval and 2-0 Vicryl  subcutaneous with buried sutures for the subcutaneous closure and running 3-  0 Monocryl for the skin with Steri-Strips and a sterile dressing, shoulder  sling.  The patient tolerated the procedure well.  Almedia Balls. Ranell Patrick, M.D.    SRN/MEDQ  D:  03/20/2004  T:  03/21/2004  Job:  [id number]

## 2011-04-19 NOTE — H&P (Signed)
NAME:  Yolanda [name], Yolanda [name]                          ACCOUNT NO.:  [id number]   MEDICAL RECORD NO.:  [id number]                   PATIENT TYPE:  OUT   LOCATION:  RAD                                  FACILITY:  [facility]   PHYSICIAN:  Dalia Heading, M.D.               DATE OF BIRTH:  1971-09-14   DATE OF ADMISSION:  DATE OF DISCHARGE:                                HISTORY & PHYSICAL   CHIEF COMPLAINT:  Chronic cholecystitis.   HISTORY OF PRESENT ILLNESS:  The patient is a 40 year old white female who  was referred for evaluation and treatment of cholecystitis secondary to  chronic cholecystitis.  She has been  having intermittent episodes of right  upper quadrant abdominal pain, nausea, vomiting and bloating for several  weeks.  It has been getting worse recently.  She does have fatty food  intolerance.  No fever, chills or jaundice have been noted.   PAST MEDICAL HISTORY:  1. Hypertension.  2. Bipolar disorder.  3. Diabetes.   PAST SURGICAL HISTORY:  1. Laparoscopic appendectomy.  2. D&C.  3. Left knee surgery.  4. Right shoulder rotator cuff repair in the past.   CURRENT MEDICATIONS:  Unknown.   ALLERGIES:  1. SULFA.   REVIEW OF SYMPTOMS:  The patient denies drinking or smoking.  She denies any  other cardiopulmonary difficulties or bleeding disorders.   PHYSICAL EXAMINATION:  GENERAL:  The patient is a well developed, well  nourished white female in no acute distress.  VITAL SIGNS:  She is afebrile and vital signs are stable.  HEENT EXAMINATION:  There is no scleral icterus.  LUNGS:  Clear to auscultation with equal breath sounds bilaterally.  HEART:  Regular rate and rhythm without S3, S4 or murmurs.  ABDOMEN:  Soft with slight tenderness noted in the right upper quadrant to  palpation.  No hepatosplenomegaly, masses or hernias are identified.  Hepatobiliary scan reveals chronic cholecystitis with a low gallbladder  ejection fraction and reproducible symptoms with a fatty  meal.   IMPRESSION:  Chronic cholecystitis.   PLAN:  The patient is scheduled for laparoscopic cholecystectomy on January 27, 2004.  The risks and benefits of the procedure including bleeding,  infection, hepatobiliary injury and the possibility of an open procedure  were fully explained to the patient who gave informed consent.     ___________________________________________                                         Dalia Heading, M.D.   MAJ/MEDQ  D:  01/24/2004  T:  01/24/2004  Job:  [id number]   cc:   Patrica Duel, M.D.  [address]  [place]  [place] [address]  Fax: [phone]

## 2011-04-19 NOTE — Op Note (Signed)
NAMEKERSTON, LANDECK                ACCOUNT NO.:  [id number]   MEDICAL RECORD NO.:  [id number]           PATIENT TYPE:  AMB   LOCATION:  DAY                           FACILITY:  [facility]   PHYSICIAN:  Langley Gauss, MD     DATE OF BIRTH:  1971-04-21   DATE OF PROCEDURE:  12/20/2004  DATE OF DISCHARGE:                                 OPERATIVE REPORT   PREOPERATIVE DIAGNOSES:  1.  6 to 8-week intrauterine pregnancy.  2.  Missed abortion.  3.  Insulin dependent diabetes.  4.  History of oligomenorrhea.  5.  History of polycystic ovarian disease.   POSTOPERATIVE DIAGNOSIS:  1.  6 to 8-week intrauterine pregnancy.  2.  Missed abortion.  3.  Insulin dependent diabetes.  4.  History of oligomenorrhea.  5.  History of polycystic ovarian disease.   OPERATION PERFORMED:  #8 suction catheter tip followed by curettage of the  uterine cavity.   SPECIMENS:  For permanent section only.  A small amount of normal-appearing  early trophoblastic tissue was identified   ESTIMATED BLOOD LOSS:  Minimal.   SURGEON:  Langley Gauss, MD   ANESTHESIA:  General endotracheal.   COMPLICATIONS:  None.   DESCRIPTION OF PROCEDURE:  The patient was taken to the operating room.  Vital signs were stable.  Patient underwent uncomplicated induction of  general endotracheal anesthesia.  She was then prepped and draped in the  usual sterile manner.  A red rubber catheter was used to drain about 400 mg  of clear, yellow urine from the bladder.  Speculum was then placed.  Anterior lip of the cervix was grasped with a single toothed tenaculum.  The  cervix itself was dilated enough to allow a uterine sound to be passed  without dilatation.  The uterus was noted to sound to a depth of about 7 cm.  Very gentle progressive dilatation was then performed up to a size #23  dilator which easily allows passage of a #8 suction catheter tip through the  endocervix and into the uterine cavity.  Gentle suction was then  applied  with a slow circular withdrawing motion.  The entire uterine cavity was  suction curettaged of obvious products of conception.  Additional passes  were performed until no additional tissue was obtained.  A fine banjo  curettage was then performed of the intrauterine cavity which revealed a  normal contour intrauterine cavity.  No evidence of any septations or  loculations.  Curettage was continued until a fine gritty sensation is  appreciated in all quadrants of the uterus to include the uterine fundus.  A  #8 suction catheter tip was again passed on one more occasion through the  endocervix.  Suction was then applied the entire uterine cavity. On this  attempt, no further tissue was obtained.  The procedure was then terminated.  The patient was reversed of anesthesia and taken to recovery room in stable  condition.  The operative findings at that time discussed with the patient's  waiting mother.   DISPOSITION:  The patient was given a copy of standardized discharge  instructions.  She will follow up in the office in one week's time.  Discharge medications are Doxycycline 100 mg by mouth twice daily times  seven days.  The patient has been on this times 48 hours preoperatively.  She is also given a prescription for Lortab 10/500, #40 with no refill.  She  is advised that no genetic studies will be performed.  I did discuss with  the patient's mother briefly options in the very near future for ovulatory  suppression with possible use of birth control pills or serial Provera  withdrawals to prevent thickening and buildup of endometrial tissue.   LABORATORY DATA:  Type and Rh is currently pending.  Standing orders do  indicate Rhogam work-up if indicated.      DC/MEDQ  D:  12/20/2004  T:  12/20/2004  Job:  [id number]
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
## 2011-04-19 NOTE — Procedures (Signed)
NAMECARRIE, USERY                ACCOUNT NO.:  [id number]   MEDICAL RECORD NO.:  [id number]          PATIENT TYPE:  OUT   LOCATION:  RAD                           FACILITY:  [facility]   PHYSICIAN:  Dani Gobble, MD       DATE OF BIRTH:  1971-03-08   DATE OF PROCEDURE:  DATE OF DISCHARGE:  03/25/2005                                  ECHOCARDIOGRAM   REFERRING PHYSICIAN:  Dr. Domingo Sep.   INDICATION:  Lower extremity edema.   Technically adequate study somewhat limited secondary to patient's body  habitus and poor acoustic windows.  Aorta appeared to be within normal  limits at 3.6 cm.  Left atrium was also within normal limits at 3.7 cm.  The  patient appeared to be in a sinus rhythm during this procedure.  The  interventricular septum and posterior wall were mildly thickened.  The  aortic valve was structurally normal with normal __________ excursion.  No  significant aortic insufficiency was noted.  Doppler interrogation of the  aortic valve was within normal limits.  The mitral valve also appeared  grossly structurally normal.  No mitral valve prolapse was noted.  Trivial  mitral regurgitation was noted.  Doppler interrogation of mitral valve is  within normal limits.  The pulmonic valve was incompletely visualized.  The  tricuspid valve also appeared grossly structurally normal.  Trivial  tricuspid regurgitation was noted.  Left ventricle is normal in size with  the LVIDD measured at 3.6 cm and LVIC measured at 2.0 cm.  Overall left  ventricular systolic function appeared normal.  In one view only, there was  a suggestion of mid-to-distal posterior wall mild hypokinesis, but this was  not appreciated on any other view.  Again, overall left ventricular systolic  function is normal.  There is no evidence for diastolic dysfunction on the  in-flow signal.  Right atrium and right ventricle are normal in size.  Right  ventricular systolic function is normal.   FINAL IMPRESSION:  1.  Mild  concentric left ventricular hypertrophy.  2.  Trivial mitral and tricuspid regurgitation.  3.  Normal left ventricular diastolic and systolic function.  In one view      only, there was a suggestion of mid-to-distal posterior wall mild      hypokinesis, but this was not appreciated in any other view.     AB/MEDQ  D:  03/25/2005  T:  03/25/2005  Job:  [id number]   cc:   Patrica Duel, M.D.  [address], [address]  [place]  [place] [address]  Fax: [phone]

## 2011-04-19 NOTE — H&P (Signed)
Yolanda [name], Yolanda [name]                ACCOUNT NO.:  [id number]   MEDICAL RECORD NO.:  [id number]          PATIENT TYPE:  OUT   LOCATION:  RAD                           FACILITY:  [facility]   PHYSICIAN:  Langley Gauss, MD     DATE OF BIRTH:  Dec 24, 1970   DATE OF ADMISSION:  09/04/2005  DATE OF DISCHARGE:  LH                                HISTORY & PHYSICAL   HISTORY OF PRESENT ILLNESS:  This is a 40 year old gravida 2, para 0 with a  last menstrual period starting about July 17, 2005, with a positive  pregnancy test just 4 days previously.  The patient comes in for initiation  of prenatal care.  She has minimal pregnancy symptoms at present, only some  mild breast tenderness.  No complaints of pelvic cramping.  No vaginal  bleeding.  Her history is complicated by previous adverse pregnancy outcome.  She did have a prior pregnancy loss requiring D&C.  She is noted to be high  risk also with history of chronic hypertension preceding the pregnancy.  She  also has history of insulin-dependent diabetes preceding the pregnancy and  previous diagnosis of polycystic ovarian disease.  Pertinently, the patient  has been under my care previously for diagnosis of polycystic ovarian  disease with oligomenorrhea.  All laboratory studies obtained within normal  limits, primarily hormonal assays, thus, giving her a diagnosis of  polycystic ovarian disease as is diagnosis of exclusion.  The patient's most  recent care has consisted of December 20, 2004, she was noted to have  diagnosis of missed AB with a crown rump length of 7-8 weeks, no fetal  cardiac activity identified.  The patient underwent dilatation and curettage  at that time with products of conception obtained.  Following that, the  patient was started on birth control pills, both for birth control purposes  and for menstrual regulation.  However, the history that she gives me was  that she as medically advised by another practitioner to  discontinue the  birth control pills secondary to history of either due to continuation of  hypertension or fluid retention.  Review of my notes December 27, 2004,  indicates the patient did have __________ trichomonas which were treated,  and she was started on Ortho Tri-Cycline at that time.  Subsequently, the  patient cancelled follow up visit on March 07, 2005, and we have not seen her  until today's date.   PAST MEDICAL HISTORY:  1.  Insulin-dependent diabetes for about two years duration.  Under the care      of an endocrinologist in [place].  2.  Reflux.  3.  Hypertension.  4.  Previous diagnoses of sleep apnea for which she did previously require      use of a CPAP breathing apparatus at nighttime.  5.  Psychiatric history of bipolar disorder.   PAST SURGICAL HISTORY:  1.  History of appendectomy in 1996.  2.  Gallbladder November 2004.  3.  Rotator cuff surgery August 2004.  4.  D&C done in February 1996 and describes one in December 02, 2004 for  the      missed AB.   MEDICATIONS:  1.  Metformin 1000 mg p.o. b.i.d.  2.  NovoLog 20/20/20 units t.i.d.  3.  Lantus 50 at 75 units daily.  4.  Nexium 40 mg p.o. daily  5.  Previously was taking aspirin 81 mg p.o. daily as a blood thinner.  6.  Changed to Aldomet 250 mg p.o. b.i.d. (was recently taking Furosemide 40      mg p.o. daily and K-Lor potassium supplementation).  She states that the      Aldomet was initiated by the Endocrinologist as it was felt to be safe      during pregnancy.  The patient stopped the Furosemide and the baby      aspirin with the positive pregnancy test.   ALLERGIES:  SULFA AND CIPRO.   SOCIAL HISTORY:  The patient is employed at [employer].  Father of the baby employed at [employer].   PHYSICAL EXAMINATION:  GENERAL APPEARANCE:  Obese.  VITAL SIGNS:  Blood pressure elevated to 167/100, weight 240 pounds, pulse  rate 80, respirations 20.  HEENT:  Slightly appearing moon  facies.  NECK:  Supple.  Thyroid nonpalpable.  LUNGS:  Clear.  CARDIOVASCULAR:  Regular rate and rhythm.  ABDOMEN:  Soft, nontender with panniculus identified.  No pelvic or  abdominal mass appreciated.  EXTREMITIES:  Within normal limits with only trace edema.  PELVIC:  Normal external genitalia.  No lesions or ulcerations identified.  No vaginal bleeding.  No leakage of fluid.  No abnormal discharges  identified.  Sterile speculum examination is performed.  Pap smear is  performed.  A transvaginal ultrasound is performed and interpreted by Dr.  Langley Gauss which does reveal a normal appearing gestational sac.  Crown  rump length measured at 0.48 cm diam ter consistent with estimated  gestational age [redacted] weeks and 1 day with an [facility] of Apr 28, 2006.  Ovaries  appear normal in appearance with some free fluid identified in the right  adnexal region.  Left adnexa reveals presence of hemorrhagic corpus luteal  cyst.  Maximum diameter of the ovary and cyst on the left 4.71 cm.  Fetal  cardiac activity identified at 126 beats per minute.  Yoke sac and fetal  pole definitely identified.   ASSESSMENT/PLAN:  Patient with multitude of high risk factors described  previously.  Pertinently, my notes indicated clearly to her that with this  pregnancy I would not be providing obstetrical care for the entirety of the  pregnancy as I do not have the resources available to me as a solo  practitioner to provide the high intensity level of care that she would  need, particularly the insulin-dependent diabetes.  Likewise, I had  discussed previously her sleep apnea condition which would likely worsen  during the pregnancy with elevation of the diaphragm and fluid retention  which could further compromise her respiratory functioning.  The patient was  fully aware that I would not be providing obstetrical care during this  pregnancy, and she would likely be referred to [facility].  This, again,  is discussed on today's visit, certainly as early as [redacted] weeks gestation at  which time a complete anatomic survey is required, complete transfer of care  will be arranged.  Additionally, the patient's endocrinologist is located in  [place].  Thus, it is the patient's best interest to have both her  obstetrical care and diabetic care at the same institution that is  specifically  located within [place], [place].  The patient was  made fully aware, prior to initiation of this pregnancy that she is to be  considered very high risk with the multitude of factors identified.  Nevertheless, she has become pregnant at this time, but I will not be  providing continuous care.  She likewise is at significant risk to deliver  prematurely.  Thus, the neonatal intensive care unit availability would be  beneficial to the patient.   DISPOSITION:  At this point in time, the patient states her blood sugars  have primarily run between 70 and 170.  She is maintaining much better  vigilance on her diabetic status at present.  She will return visit in one  weeks time for  repeat ultrasound with continued documentation of the viability of  pregnancy.  Will be arranging for complete transfer of obstetrical care.  Utilizing the ultrasonographic dates today, her [facility] and planned date of  admission on or about Apr 28, 2006.      Langley Gauss, MD     DC/MEDQ  D:  09/04/2005  T:  09/04/2005  Job:  [id number]
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
## 2011-07-12 ENCOUNTER — Inpatient Hospital Stay (HOSPITAL_COMMUNITY)
Admission: RE | Admit: 2011-07-12 | Payer: No Typology Code available for payment source | Source: Ambulatory Visit | Admitting: Physical Therapy

## 2011-07-15 ENCOUNTER — Ambulatory Visit (HOSPITAL_COMMUNITY): Payer: No Typology Code available for payment source

## 2011-07-17 ENCOUNTER — Ambulatory Visit (HOSPITAL_COMMUNITY): Payer: No Typology Code available for payment source | Admitting: Physical Therapy

## 2011-07-17 ENCOUNTER — Ambulatory Visit (HOSPITAL_COMMUNITY)
Admission: RE | Admit: 2011-07-17 | Discharge: 2011-07-17 | Disposition: A | Discharge status: Home / Self Care | Payer: No Typology Code available for payment source | Source: Ambulatory Visit | Attending: Rehabilitation | Admitting: Rehabilitation

## 2011-07-17 DIAGNOSIS — R262 Difficulty in walking, not elsewhere classified: Secondary | ICD-10-CM | POA: Insufficient documentation

## 2011-07-17 DIAGNOSIS — M79609 Pain in unspecified limb: Secondary | ICD-10-CM | POA: Insufficient documentation

## 2011-07-17 DIAGNOSIS — M6281 Muscle weakness (generalized): Secondary | ICD-10-CM | POA: Insufficient documentation

## 2011-07-17 DIAGNOSIS — S72309A Unspecified fracture of shaft of unspecified femur, initial encounter for closed fracture: Secondary | ICD-10-CM | POA: Insufficient documentation

## 2011-07-17 DIAGNOSIS — IMO0001 Reserved for inherently not codable concepts without codable children: Secondary | ICD-10-CM | POA: Insufficient documentation

## 2011-07-17 DIAGNOSIS — S82143A Displaced bicondylar fracture of unspecified tibia, initial encounter for closed fracture: Secondary | ICD-10-CM | POA: Insufficient documentation

## 2011-07-17 DIAGNOSIS — M25579 Pain in unspecified ankle and joints of unspecified foot: Secondary | ICD-10-CM | POA: Insufficient documentation
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
## 2011-07-22 ENCOUNTER — Ambulatory Visit (HOSPITAL_COMMUNITY)
Admission: RE | Admit: 2011-07-22 | Discharge: 2011-07-22 | Disposition: A | Discharge status: Home / Self Care | Payer: No Typology Code available for payment source | Source: Ambulatory Visit | Attending: Physical Therapy | Admitting: Physical Therapy

## 2011-07-22 NOTE — Progress Notes (Signed)
Physical Therapy Treatment Patient Details  Name: Yolanda [name] MRN: [id number] Date of Birth: [birth year]  Today's Date: 07/22/2011 Time: 1110-1203 Time Calculation (min): 53 min Charges: Manual 10', TE: 35', TA: 8' Visit#: 2 of 12 Re-eval: 08/14/11  Subjective: Symptoms/Limitations Symptoms: Pt reports that she is doing pretty well today.  She is taking Oxycodone and reports pain of 6/10.  She reports she did well last time with the exercises. Special Instructions from MD. WBAT to BUE and LLE, NWB to RLE.   Pain Assessment Pain Score:   6 Pain Location: Leg Pain Orientation: Right;Left  Exercise/Treatments See Doc Flow Sheets for further information. Therapeutic Activity:  Standing in RW w/Arm pushes for activity tolerance.  Standing: Gt Training 5 hops x3 in parallel bars forward and backwards - mod A.  Sit to Stand 5x throughout tx min-mod A from WC and EOB. Transfer: EOB to W/C independent w/slide. Seated:  BLE LAQ's 2# 2x10,  Hip Abduction 10x10 sec Hip Adduction 10x10 sec Abd Set w/Grn Ball in lap 10x10 sec SUPINE: Heel Slides RLE: 20 reps BLE SAQ's 2x10 Gt. Training in parallel bars for activity tolerance   Manual Therapy Manual Therapy: Other (comment) Joint Mobilization: R knee:  AROM: 5-120 degrees.  PROM: 2-120 degrees.  Grade II-III AP mobs to increase knee flexion.   Other Manual Therapy: Scar mobilization to lateral R knee to decrease facial adherence.   Physical Therapy Assessment and Plan PT Assessment and Plan Clinical Impression Statement: Pt tolerated closed chain and open chain activities well today and had improved LLE hopping length with increased repition and rest breaks. PT Plan: Special Instructions from MD. WBAT to BUE and LLE, NWB to RLE.  Cont to progress gait with parallel bar and w/RW.  Add standing RLE HS curls, seated LLE HS curls with T-band and functional transfer training.     Goals PT Short Term Goals PT Short Term Goal 1: Pt will  be independent with HEP  PT Short Term Goal 1 - Progress: Progressing toward goal PT Short Term Goal 2: Pt will demonstrate tandem stance x10 sec independently  PT Short Term Goal 2 - Progress: Not met PT Short Term Goal 3: Pt will ambulate 10 ft w/LRAD without an increase in pain  PT Short Term Goal 3 - Progress: Progressing toward goal PT Short Term Goal 4: Pt will improve R knee motion to WNL PT Short Term Goal 4 - Progress: Progressing toward goal PT Long Term Goals PT Long Term Goal 1: Pt will improve LE strength to [facility] in order to ambulate 10 minutes w/LRAD in order to return to active lifestyle.  PT Long Term Goal 2: Pt will complete all basic transfers independently in order to improve quality of life.   Long Term Goal 3: Pt will ambulate x10 ft on uneven surface w/supervision w/LRAD in order to participate safely in outdoor activities.   Problem List Patient Active Problem List  Diagnoses  . Femoral shaft fracture  . Tibial plateau fracture  . Difficulty in walking  . Muscle weakness (generalized)    PT - End of Session Equipment Utilized During Treatment: Gait belt Activity Tolerance: Patient tolerated treatment well  Jermya Dowding 07/22/2011, 12:07 PM

## 2011-07-24 ENCOUNTER — Ambulatory Visit (HOSPITAL_COMMUNITY)
Admission: RE | Admit: 2011-07-24 | Discharge: 2011-07-24 | Disposition: A | Discharge status: Home / Self Care | Payer: No Typology Code available for payment source | Source: Ambulatory Visit | Attending: Physical Therapy | Admitting: Physical Therapy

## 2011-07-24 NOTE — Progress Notes (Addendum)
Physical Therapy Treatment Patient Details  Name: Yolanda [name] MRN: [id number] Date of Birth: 10-Apr-1971  Today's Date: 07/24/2011 Time: 6295-2841 Time Calculation (min): 45 min Charges:  Visit#: 3 of 12 Re-eval: 08/14/11  Subjective: Symptoms/Limitations Symptoms: I have some questions about my HEP.  I was doing them and some of them were very painful to my R knee.   Pain Assessment Pain Score:   7 Pain Location: Knee Pain Orientation: Right Multiple Pain Sites: No  Precautions/Restrictions    Special Instructions from MD. WBAT to BUE and LLE, NWB to RLE.  Mobility (including Balance)   W/C mobility with min A, pt using more UE to propel W/C into therapy    Exercise/Treatments Standing  Gait Training in parellel bars x2 RT for activity tolerance forwards and backwards Gait training w/RW w/min A forward and backwards 3 RT - required at most 2 standing Rest breaks - completed for activity tolerance Sit to Stand 10x throughout session, 5x in a row for activity tolerance - Requires min- mod A w/mod cueing for sequencing for stand, max A for sitting Transfer Training to chair to R and L x1 each w/RW w/min A  RLE H/S curls 2x10 w/RW Seated LAQ's BLE: 2x10, no weight RLE, 2# LLE  Abdominal isometrics w/grn ball 10x10sec hold Hip Abduction and Adduction w/yellow ball 10x10sec hold and feet elevated on stool Shoulder presses in chair 2x5   Physical Therapy Assessment and Plan PT Assessment and Plan Clinical Impression Statement: Pt had improved activity tolerance with less rest breaks with gait and sit to stand activities.  She is more awake and alert during her session and needed only min cueing for HEP.   Pt demonstrates increased difficulty with L SL balance when sitting and decreased eccentric control.  PT Plan: Special Instructions from MD. WBAT to BUE and LLE, NWB to RLE.   H/S bridging and curls on Bed, continue with gait, when she has appropriate W/C complete W/C training  throughout hallways and outside for community mobility.    Goals    Problem List Patient Active Problem List  Diagnoses  . Femoral shaft fracture  . Tibial plateau fracture  . Difficulty in walking  . Muscle weakness (generalized)    PT - End of Session Equipment Utilized During Treatment: Gait belt Activity Tolerance: Patient tolerated treatment well  Loy Little 07/24/2011, 12:10 PM

## 2011-07-29 ENCOUNTER — Ambulatory Visit (HOSPITAL_COMMUNITY)
Admission: RE | Admit: 2011-07-29 | Discharge: 2011-07-29 | Disposition: A | Discharge status: Home / Self Care | Payer: No Typology Code available for payment source | Source: Ambulatory Visit | Attending: Rehabilitation | Admitting: Rehabilitation

## 2011-07-29 NOTE — Progress Notes (Signed)
Physical Therapy Treatment Patient Details  Name: FIDELIS LOTH MRN: [id number] Date of Birth: Sep 27, 1971  Today's Date: 07/29/2011 Time: 1112-12121212 Time Calculation (min): 60 min Visit#: 4 of 12 Re-eval: 08/14/11 Charges: TA x40 min, Manual x10, ice x10  Subjective: Symptoms/Limitations Symptoms: Pt reports that she is doing okay.  Her greatest in her bottom and L knee.  Pt reports she was able to get her hair cut and go to the dentist and was able to transfer without difficulty.   Pain Assessment Currently in Pain?: Yes Pain Score:   5 Pain Location: Knee Pain Orientation: Left  Precautions/Restrictions   NWB RLE  Mobility (including Balance) Transfers Transfers: Yes Sit to Stand: 5: Supervision Sit to Stand Details (indicate cue type and reason): Cueing for handplacement on W/C Stand to Sit: 5: Supervision Stand to Sit Details: Cueing for handplacement to W/C Stand Pivot Transfers: 4: Min Actuary Details (indicate cue type and reason): Cueing for NWB on RLE Ambulation/Gait Ambulation/Gait: Yes Ambulation/Gait Assistance: 5: Supervision Ambulation/Gait Assistance Details (indicate cue type and reason): Cueing for posture Ambulation Distance (Feet): 250 Feet Assistive device: Rolling walker Gait Pattern: Trunk flexed Wheelchair Mobility Wheelchair Mobility: Yes Wheelchair Assistance: 2: Max assist;4: Min assist;5: Financial planner Details (indicate cue type and reason): max A to go down ramps and up steep hills, min A to decline small hill inclines, supervision through static and even surface.  total A for door managmentx10 minutes Wheelchair Propulsion: Both upper extremities Distance: x10 minutes in open and closed environment for activitty tolerance   Tub and Toilet Transfers w/min A and RW x1 each  Exercise/Treatments L SLS w/min A 3x30 sec  Heel Slides 10x w/PROM at end range x30 sec Quad Sets 5x10 sec w/PROM at end  range hold x30 sec Modalities Modalities: Cryotherapy Manual Therapy Manual Therapy: Joint mobilization Joint Mobilization: SUPINE: Grade II-IV AP mobs to increase knee flexion and extension Cryotherapy Number Minutes Cryotherapy: 10 Minutes Cryotherapy Location: Knee Type of Cryotherapy: Ice pack Weight Bearing Technique Weight Bearing Technique: No  Physical Therapy Assessment and Plan PT Assessment and Plan Clinical Impression Statement: Pt was able to sequence her W/C mobility well and has improved activity tolerance with ambulation today and required 5 RB to complete 247ft w/RW and supervision.  Pt continues to be limited by L single leg balance and needs cueing for RLE NWB.   PT Plan: Special Instructions from MD. WBAT to BUE and LLE, NWB to RLE. H/S bridging and curls on Bed, continue with gait training and community W/C training    Goals    Problem List Patient Active Problem List  Diagnoses  . Femoral shaft fracture  . Tibial plateau fracture  . Difficulty in walking  . Muscle weakness (generalized)       Louella Medaglia 07/29/2011, 12:16 PM

## 2011-07-31 ENCOUNTER — Inpatient Hospital Stay (HOSPITAL_COMMUNITY): Admission: RE | Admit: 2011-07-31 | Payer: No Typology Code available for payment source | Source: Ambulatory Visit

## 2011-07-31 ENCOUNTER — Telehealth (HOSPITAL_COMMUNITY): Payer: Self-pay

## 2011-08-06 ENCOUNTER — Ambulatory Visit (HOSPITAL_COMMUNITY)
Admission: RE | Admit: 2011-08-06 | Discharge: 2011-08-06 | Disposition: A | Discharge status: Home / Self Care | Payer: No Typology Code available for payment source | Source: Ambulatory Visit | Attending: Rehabilitation | Admitting: Rehabilitation

## 2011-08-06 ENCOUNTER — Telehealth (HOSPITAL_COMMUNITY): Payer: Self-pay | Admitting: Physical Therapy

## 2011-08-06 DIAGNOSIS — M25579 Pain in unspecified ankle and joints of unspecified foot: Secondary | ICD-10-CM | POA: Insufficient documentation

## 2011-08-06 DIAGNOSIS — M79609 Pain in unspecified limb: Secondary | ICD-10-CM | POA: Insufficient documentation

## 2011-08-06 DIAGNOSIS — R262 Difficulty in walking, not elsewhere classified: Secondary | ICD-10-CM | POA: Insufficient documentation

## 2011-08-06 DIAGNOSIS — M6281 Muscle weakness (generalized): Secondary | ICD-10-CM | POA: Insufficient documentation

## 2011-08-06 DIAGNOSIS — IMO0001 Reserved for inherently not codable concepts without codable children: Secondary | ICD-10-CM | POA: Insufficient documentation

## 2011-08-06 NOTE — Progress Notes (Signed)
Physical Therapy Treatment Patient Details  Name: Yolanda [name] MRN: [id number] Date of Birth: [birth year]  Today's Date: 08/06/2011 Time: 4098-1191 Time Calculation (min): 34 min Charges: TE: 19', Gt: 15' Visit#: 5 of 12 Re-eval: 08/14/11  Subjective:    Precautions/Restrictions     Mobility (including Balance)   2x around the gym (500') w/ 1 standing rest break each. - Supervision w/RW w/cueing for posture and LLE stride length  Time: 15 minutes Door management w/max A for instruction on sequencing using posterior approach.    Exercise/Treatments Seated BLE:  LAQ: 3x10 w/3lbs on LLE  H/S curls w/Grn band 2x10    Physical Therapy Assessment and Plan    Pt has improved endurance and was able to ambulate 2x5 minutes with 1 standing rest break even with increased fatigue from medication today. Pt needs cueing for posture and LLE stride length.  PLAN:Special Instructions from MD. WBAT to BUE and LLE, NWB to RLE. H/S bridging and curls on Bed, continue with gait training and community W/C training.  Check on W/C order from MD (see note under telephone call for further details)   Goals    Problem List Patient Active Problem List  Diagnoses  . Femoral shaft fracture  . Tibial plateau fracture  . Difficulty in walking  . Muscle weakness (generalized)       Yolanda [name] 08/06/2011, 1:39 PM

## 2011-08-08 ENCOUNTER — Ambulatory Visit (HOSPITAL_COMMUNITY)
Admission: RE | Admit: 2011-08-08 | Discharge: 2011-08-08 | Disposition: A | Discharge status: Home / Self Care | Payer: No Typology Code available for payment source | Source: Ambulatory Visit

## 2011-08-08 NOTE — Progress Notes (Signed)
Physical Therapy Treatment Patient Details  Name: Yolanda [name] MRN: [id number] Date of Birth: 11/08/1971  Today's Date: 08/08/2011 Time: 1191-4782 Time Calculation (min): 62 min Visit#: 6 of 12 Re-eval: 08/14/11 Charge: therex 38 min Gait training: 10 min Therapeutic activity: 10 min  Subjective: Symptoms/Limitations Symptoms: 3/10 R knee, pt has not heard from MD on order for standard wheelchair yet. Pain Assessment Currently in Pain?: Yes Pain Score:   3 Pain Location: Knee Pain Orientation: Right  Precautions/Restrictions     Mobility (including Balance) Bed Mobility Bed Mobility: No Transfers Transfers: Yes Sit to Stand: 5: Supervision Sit to Stand Details (indicate cue type and reason): Cueing for handplacement with sit to stand from table Stand to Sit: 5: Supervision Stand to Sit Details: Cueing for handplacement to table Stand Pivot Transfers: 4: Min assist Stand Pivot Transfer Details (indicate cue type and reason): Cueing for NWB on RLE Ambulation/Gait Ambulation/Gait: Yes Ambulation/Gait Assistance: 5: Supervision Ambulation/Gait Assistance Details (indicate cue type and reason): Cueing for NWB R LE  Ambulation Distance (Feet): 300 Feet Assistive device: Rolling walker Gait Pattern: Trunk flexed Stairs: No Door Management: 2: Max assist Ramp: 3: Mod assist Ramp Details (indicate cue type and reason): cueing for safety and sequencing  Wheelchair Mobility Wheelchair Mobility: Yes Wheelchair Assistance: 5: Supervision;4: Administrator, sports Details (indicate cue type and reason): mod A to go up and down ramps/steep hills; supervision thorugh static and even surface x 10 min Wheelchair Propulsion: Both upper extremities Distance: indoor and outdoor community mobility x 10 min     Exercise/Treatments WC Mobility outside short stay entrance, down ramp, up ramp into main [employer] entrance  with mod A ramps, supervision on static even  surfaces. 2x around the gym (500')  w/ 3 rest break each. - Supervision w/RW w/cueing for posture and LLE stride length Time: 15 minutes   Seated BLE:  LAQ: 2x10  H/S curls w/Grn band 2x10 Supine: Bridge with LLE on green ball UBE x 4' 1.0 for endurance    Physical Therapy Assessment and Plan PT Assessment and Plan Clinical Impression Statement: Endurance improving, supervision with even surfaces with WC mobility.  Mod A required with incline/decline slopes during community WC mobility.  Added UBE for UE endurance, pt limited by fatigue at end of session. PT Plan: Special Instructions from MD. WBAT to BUE and LLE, NWB to RLE. H/S bridging and curls on Bed, continue with gait training and community W/C training. Check on W/C order from MD (see note under telephone call for further details  Progress strength/endurance next tx.    Goals    Problem List Patient Active Problem List  Diagnoses  . Femoral shaft fracture  . Tibial plateau fracture  . Difficulty in walking  . Muscle weakness (generalized)    PT - End of Session Equipment Utilized During Treatment: Gait belt Activity Tolerance: Patient tolerated treatment well;Patient limited by fatigue General Behavior During Session: [facility] for tasks performed Cognition: [facility] for tasks performed  Juel Burrow 08/08/2011, 1:12 PM

## 2011-08-12 ENCOUNTER — Ambulatory Visit (HOSPITAL_COMMUNITY)
Admission: RE | Admit: 2011-08-12 | Discharge: 2011-08-12 | Disposition: A | Discharge status: Home / Self Care | Payer: No Typology Code available for payment source | Source: Ambulatory Visit | Attending: Pediatrics | Admitting: Pediatrics

## 2011-08-12 NOTE — Progress Notes (Signed)
Physical Therapy Treatment/Progress Note Patient Details  Name: Yolanda [name] MRN: [id number] Date of Birth: Apr 12, 1971  Today's Date: 08/12/2011 Time: 1027-2536 Time Calculation (min): 44 min Charges: 1 ROM, 10' Gt, 20' TE Visit#: 7 of 12 Re-eval:    Subjective: Symptoms/Limitations Symptoms: Pt reports that she is on her medication this morning.  She states she is not doing much walking around her house and stays mostly in her W/C.  Educated pt to continue with gati training at home to improve endurance and independence.   Precautions/Restrictions     Mobility (including Balance) Transfers Stand to Sit: 6: Modified independent (Device/Increase time) Stand Pivot Transfers: 6: Modified independent (Device/Increase time) Ambulation/Gait Ambulation/Gait: Yes Ambulation/Gait Assistance: 5: Supervision Ambulation Distance (Feet): 400 Feet (10 minutes) Assistive device: Rolling walker Gait Pattern: Trunk flexed Stairs: No Door Management: 2: Max assist     Exercise/Treatments Aerobic  UBE x8 min for endurance Supine: RLE  Quad Sets 5x10 sec SLR 2x10 Sidelying: RLE  Hip abduction 3x10   Manual Therapy Manual Therapy: Joint mobilization Joint Mobilization: AROM 3-115  Physical Therapy Assessment and Plan PT Assessment and Plan Clinical Impression Statement: See progress note PT Frequency: Min 2X/week PT Duration: 4 weeks PT Treatment/Interventions: DME instruction;Gait training;Stair training;Functional mobility training;Therapeutic exercise;Balance training;Other (comment) (manual and modalites for ROM and pain control) PT Plan: Pt to visit MD on 9/13 to discuss ankle fusion and further therapy.  Pt is scheduling 2x/wk currently and pt will cx if MD does not approve.     Goals PT Short Term Goals PT Short Term Goal 1: Pt will be independent with HEP  PT Short Term Goal 1 - Progress: Met PT Short Term Goal 2: Pt will demonstrate L SLS stance x10 sec independently    PT Short Term Goal 2 - Progress: Progressing toward goal PT Short Term Goal 3: Pt will ambulate 10 ft w/LRAD without an increase in pain  PT Short Term Goal 3 - Progress: Met PT Short Term Goal 4: Pt will improve R knee motion to WNL PT Short Term Goal 4 - Progress: Progressing toward goal PT Long Term Goals PT Long Term Goal 1: Pt will improve LE strength to [facility] in order to ambulate 10 minutes w/LRAD in order to return to active lifestyle.  PT Long Term Goal 1 - Progress: Met PT Long Term Goal 2: Pt will complete all basic transfers independently in order to improve quality of life.   PT Long Term Goal 2 - Progress: Met Long Term Goal 3: Pt will ambulate x10 ft on uneven surface w/supervision w/LRAD in order to participate safely in outdoor activities.  Long Term Goal 3 Progress: Not met  Problem List Patient Active Problem List  Diagnoses  . Femoral shaft fracture  . Tibial plateau fracture  . Difficulty in walking  . Muscle weakness (generalized)       Yolanda [name] 08/12/2011, 11:18 AM

## 2011-08-15 ENCOUNTER — Inpatient Hospital Stay (HOSPITAL_COMMUNITY)
Admission: RE | Admit: 2011-08-15 | Payer: No Typology Code available for payment source | Source: Ambulatory Visit | Admitting: Physical Therapy

## 2011-08-21 ENCOUNTER — Ambulatory Visit (HOSPITAL_COMMUNITY)
Admission: RE | Admit: 2011-08-21 | Discharge: 2011-08-21 | Disposition: A | Discharge status: Home / Self Care | Payer: No Typology Code available for payment source | Source: Ambulatory Visit | Attending: Physical Therapy | Admitting: Physical Therapy

## 2011-08-21 NOTE — Progress Notes (Signed)
Physical Therapy Treatment Patient Details  Name: Yolanda [name] MRN: [id number] Date of Birth: 06-08-1971  Today's Date: 08/21/2011 Time: 1520-1600 Time Calculation (min): 40 min Charges: TE x 15', Man: x25' Visit#: 1  of 12   Re-eval: 09/09/11    Subjective: Symptoms/Limitations Symptoms: Pt reports she went to see Dr. Lorin Picket who is allowing WBAT on tolerated on RLE in CAM boot.   Pain Assessment Currently in Pain?: Yes Pain Score:   6 Pain Location: Calf Pain Orientation: Right Pain Type: Acute pain  Precautions/Restrictions     Mobility (including Balance) Ambulation/Gait Ambulation/Gait: Yes Ambulation/Gait Assistance: 5: Supervision Ambulation/Gait Assistance Details (indicate cue type and reason): cueing for posture and gait mechanics Ambulation Distance (Feet): 300 Feet Assistive device: Rolling walker Gait Pattern: Step-to pattern;Decreased stride length;Decreased hip/knee flexion - right;Decreased dorsiflexion - left;Decreased dorsiflexion - right;Trunk flexed Door Management: 2: Max assist     Exercise/Treatments   Standing  Functional squats 2x10 RLE hamstring curl 2x10   Ankle Exercises Heel Raises: 10 reps;Limitations Heel Raises Limitations: Seated Toe Raise: 10 reps;Limitations Toe Raise Limitations: Seated  Manual Therapy Manual Therapy: Edema management Edema Management: Retro massage to decrease swelling to R ankle x 20 minutes Joint Mobilization: Grade I-III metatarsal joint mobs to R foot.  Ankle AROM: neutral for all diections; PROM able to achieve 2 degrees each direction.  Physical Therapy Assessment and Plan PT Assessment and Plan Clinical Impression Statement: Pt comes in today in CAM boot.  She has notable wound to her R ankle medial malleolous and dorsal aspect of foot, and eschar to top of her toes.  Her ankle is curently in neutral position and has increased pain and tenderness to the plantar aspect of her foot and ankle.  She was  able to ambulate with RW with S, however continues to require cueing for posture and gait mechanics.  Rehab Potential: Good PT Frequency: Min 3X/week PT Duration: 4 weeks PT Treatment/Interventions: Gait training;Stair training;Functional mobility training;Therapeutic exercise PT Plan: Check to see if order is recieved for wound care.  Add bike, towel in/ev to pain free limits.    Goals    Problem List Patient Active Problem List  Diagnoses  . Femoral shaft fracture  . Tibial plateau fracture  . Difficulty in walking  . Muscle weakness (generalized)       Ebubechukwu Jedlicka 08/21/2011, 4:09 PM

## 2011-08-22 LAB — CBC
HCT: 30.8 — ABNORMAL LOW
Hemoglobin: 10.2 — ABNORMAL LOW
Hemoglobin: 10.3 — ABNORMAL LOW
MCHC: 33.4
MCHC: 33.7
MCHC: 33.8
Platelets: 220
Platelets: 246
RBC: 3.71 — ABNORMAL LOW
RBC: 4.51
RDW: 14.6
WBC: 10.4
WBC: 17.5 — ABNORMAL HIGH

## 2011-08-22 LAB — URINE CULTURE

## 2011-08-22 LAB — DIFFERENTIAL
Basophils Absolute: 0
Basophils Absolute: 0
Basophils Relative: 0
Basophils Relative: 0
Basophils Relative: 0
Eosinophils Absolute: 0
Eosinophils Absolute: 0.1
Eosinophils Relative: 0
Eosinophils Relative: 1
Lymphocytes Relative: 29
Lymphs Abs: 1.8
Monocytes Absolute: 0.7
Monocytes Relative: 7
Neutro Abs: 13.4 — ABNORMAL HIGH
Neutro Abs: 5
Neutrophils Relative %: 75
Neutrophils Relative %: 77

## 2011-08-22 LAB — COMPREHENSIVE METABOLIC PANEL
ALT: 61 — ABNORMAL HIGH
AST: 95 — ABNORMAL HIGH
Alkaline Phosphatase: 231 — ABNORMAL HIGH
CO2: 24
Calcium: 8.1 — ABNORMAL LOW
Chloride: 103
GFR calc Af Amer: 59 — ABNORMAL LOW
GFR calc non Af Amer: 49 — ABNORMAL LOW
Glucose, Bld: 75
Potassium: 3.3 — ABNORMAL LOW
Sodium: 137
Total Bilirubin: 1.1

## 2011-08-22 LAB — BASIC METABOLIC PANEL
BUN: 12
BUN: 62 — ABNORMAL HIGH
CO2: 25
CO2: 26
Calcium: 8.6
Calcium: 8.7
Creatinine, Ser: 2.25 — ABNORMAL HIGH
GFR calc Af Amer: 30 — ABNORMAL LOW
GFR calc non Af Amer: >60
Glucose, Bld: 139 — ABNORMAL HIGH
Sodium: 137

## 2011-08-22 LAB — URINE MICROSCOPIC-ADD ON

## 2011-08-22 LAB — HEMOGLOBIN A1C
Hgb A1c MFr Bld: 8.6 — ABNORMAL HIGH
Mean Plasma Glucose: 229

## 2011-08-22 LAB — URINALYSIS, ROUTINE W REFLEX MICROSCOPIC
Glucose, UA: NEGATIVE
Hgb urine dipstick: NEGATIVE
Specific Gravity, Urine: 1.025

## 2011-08-27 ENCOUNTER — Ambulatory Visit (HOSPITAL_COMMUNITY)
Admission: RE | Admit: 2011-08-27 | Discharge: 2011-08-27 | Disposition: A | Discharge status: Home / Self Care | Payer: No Typology Code available for payment source | Source: Ambulatory Visit

## 2011-08-27 NOTE — Progress Notes (Signed)
Physical Therapy Treatment Patient Details  Name: Yolanda [name] MRN: [id number] Date of Birth: 03-Oct-1971  Today's Date: 08/27/2011 Time: 6295-2841 Time Calculation (min): 45 min Visit#: 2  of 12   Re-eval: 09/09/11  Charge: retro massage 8 min therex 31 min  Subjective: Symptoms/Limitations Symptoms: I just took a pain pill so my pain is okay right now. Pain Assessment Currently in Pain?: Yes Pain Score:   3 Pain Location: Foot Pain Orientation: Anterior  Precautions/Restrictions     Mobility (including Balance) Bed Mobility Bed Mobility: No Transfers Transfers: Yes Sit to Stand: 5: Supervision Stand to Sit: 5: Supervision Stand Pivot Transfers: 6: Modified independent (Device/Increase time) Ambulation/Gait Ambulation/Gait: Yes Ambulation/Gait Assistance: 5: Supervision Ambulation/Gait Assistance Details (indicate cue type and reason): cueing for posture and gait mechanics Ambulation Distance (Feet): 450 Feet Assistive device: Rolling walker Gait Pattern: Step-to pattern;Decreased step length - left;Decreased hip/knee flexion - right;Decreased dorsiflexion - left;Decreased dorsiflexion - right;Trunk flexed Stairs: No Door Management: 2: Max assist     Exercise/Treatments Functional squats x 20 Knee Flexion 20 x  Ankle Exercises Heel Raises: 20 reps;Limitations Heel Raises Limitations: Standing 10x 2 Toe Raise: 10 reps;Limitations Toe Raise Limitations: seated  Towel Inversion/Eversion: Limitations Towel Inversion/Eversion Limitations: attempted, unable to complete secondary to impaired ROM  Plyometrics    Manual Therapy Manual Therapy: Edema management Edema Management: Retro massage to decrease swelling to R ankle x 15 minutes Joint Mobilization: Ankle AROM, neutral for all directions, PROM able to achieve 2-3 degrees each direction.  Physical Therapy Assessment and Plan PT Assessment and Plan Clinical Impression Statement: Improved gait mechanics  following vc for posture and equal stride length.  Ankle still lacking significant ROM in all directions, at neutral actively, able to achieve 2-3 degrees each direction.  Attempted inv/env, pt unable to complete secondary to the decreased ROM without compensating through hip and knee.  Have not received signed order to begin wound care on anterior ankle. PT Plan: Check to see if order is recieved for wound care    Goals    Problem List Patient Active Problem List  Diagnoses  . Femoral shaft fracture  . Tibial plateau fracture  . Difficulty in walking  . Muscle weakness (generalized)    PT - End of Session Equipment Utilized During Treatment: Gait belt Activity Tolerance: Patient tolerated treatment well General Behavior During Session: [facility] for tasks performed Cognition: [facility] for tasks performed  Juel Burrow 08/27/2011, 6:49 PM

## 2011-08-28 ENCOUNTER — Ambulatory Visit (HOSPITAL_COMMUNITY): Payer: No Typology Code available for payment source

## 2011-08-30 ENCOUNTER — Ambulatory Visit (HOSPITAL_COMMUNITY)
Admission: RE | Admit: 2011-08-30 | Discharge: 2011-08-30 | Disposition: A | Discharge status: Home / Self Care | Payer: No Typology Code available for payment source | Source: Ambulatory Visit | Attending: Pediatrics | Admitting: Pediatrics

## 2011-09-02 ENCOUNTER — Ambulatory Visit (HOSPITAL_COMMUNITY): Payer: No Typology Code available for payment source | Admitting: Physical Therapy

## 2011-09-02 NOTE — Progress Notes (Addendum)
Physical Therapy Treatment Patient Details  Name: Yolanda [name] MRN: [id number] Date of Birth: [birth year]  Today's Date: 08/30/2011 Time: 4098-1191.  Time: 53 min Charges: 8' manual,  Visit#: 3 of 12 Re-eval: 09/09/11    Subjective:  She reports she continues to have increased pain and difficulty with balance overall. She is only ambulating in her house for short distances with the RW even though she is WBAT Limitations How long can you sit comfortably? How long can you stand comfortably? 20 minutes How long can you walk comfortably? 1 minute Pain 5/10  Precautions/Restrictions     Mobility (including Balance)       Exercise/Treatments  08/30/11 1500  Ankle Exercises  Ankle Circles/Pumps AAROM;20 reps;Seated;Limitations  Ankle Circles/Pumps Limitations CW and CCW  Ankle Dorsiflexion Limitations  Ankle Dorsiflexion Limitations isometrics 5x10 sec  Ankle Plantar Flexion Limitations  Ankle Plantar Flexion Limitations isometrics 5x10 sec  Ankle Eversion Limitations  Ankle Eversion Limitations isometrics 5x10 sec  Ankle Inversion Limitations  Ankle Inversion Limitations isometrics 5x10 sec  Heel Raises 20 reps;Limitations  Heel Raises Limitations Seated and standing  Toe Raise 20 reps;Limitations  Toe Raise Limitations Seated  Additional Ankle Exercises  Towel Crunch Limitations  Towel Crunch Limitations 3x1 minute, unable to scrunch towel.  Towel Inversion/Eversion 5 reps  Gait Training inside and outside x20 minutes for improved endurance.   Manual treatment: Edema management to decrease swelling w/PROM x8 minutes    Physical Therapy Assessment and Plan    Clinical Assessment: Pt was able to ambulate x10 minutes without rest break in indoor and outdoor environment with increased pain. She has difficulty with toe flexion and abduction. Continues to improve overall endurance but continues to have pain with increased ambulation.  Plan: Cont to progress.  Focus on  balance and ankle ROM.  Goals    Problem List Patient Active Problem List  Diagnoses  . Femoral shaft fracture  . Tibial plateau fracture  . Difficulty in walking  . Muscle weakness (generalized)       Zaakirah Kistner 09/02/2011, 8:09 AM

## 2011-09-04 ENCOUNTER — Ambulatory Visit (HOSPITAL_COMMUNITY): Payer: No Typology Code available for payment source | Admitting: Physical Therapy

## 2011-09-06 ENCOUNTER — Ambulatory Visit (HOSPITAL_COMMUNITY)
Admission: RE | Admit: 2011-09-06 | Discharge: 2011-09-06 | Disposition: A | Discharge status: Home / Self Care | Payer: No Typology Code available for payment source | Source: Ambulatory Visit | Attending: Rehabilitation | Admitting: Rehabilitation

## 2011-09-06 DIAGNOSIS — M79609 Pain in unspecified limb: Secondary | ICD-10-CM | POA: Insufficient documentation

## 2011-09-06 DIAGNOSIS — R262 Difficulty in walking, not elsewhere classified: Secondary | ICD-10-CM | POA: Insufficient documentation

## 2011-09-06 DIAGNOSIS — IMO0001 Reserved for inherently not codable concepts without codable children: Secondary | ICD-10-CM | POA: Insufficient documentation

## 2011-09-06 DIAGNOSIS — M6281 Muscle weakness (generalized): Secondary | ICD-10-CM | POA: Insufficient documentation

## 2011-09-06 DIAGNOSIS — M25579 Pain in unspecified ankle and joints of unspecified foot: Secondary | ICD-10-CM | POA: Insufficient documentation

## 2011-09-06 NOTE — Progress Notes (Signed)
Physical Therapy Treatment/Evaluation- Wound Patient Details  Name: Yolanda [name] MRN: [id number] Date of Birth: 10/14/1971  Today's Date: 09/06/2011 Time: 1191-4782 Time Calculation (min): 41 min Charges: 1 wound eval; 18' TE, 8' Gt Visit#: 4  of 12   Re-eval: 09/09/11   Subjective: Symptoms/Limitations Symptoms: Pt comes in today walking with her RW.  She is here for her eval for wound care eval and LE treatment.  Pt reports that her L foot is hurting and feels like her L foot is cramping.  Pain Assessment Currently in Pain?: Yes Pain Score:   5 Pain Location: Foot Pain Orientation: Left  Wound Eval: 3 locations #1 to dorsal aspect of the ankle with increased erythema w/100% granulation #2: Lateral heel portal.  Depth 0.2 cm w/40% slough, 60% granulation #3 dorsal aspect of the metatarsals with increased erythema and 100% granulation.  Irregation and debridement w/gauze and forceps to clear dead skin and packed #2 w/gauze and sailene/hydrogel   Exercise/Treatments  09/06/11 0700  Knee Exercises: Machines for Strengthening  Cybex Knee Extension 1.5 PL 2x10  Cybex Knee Flexion 3 PL 2x10  Knee Exercises: Standing  Gait Training 8 min CGA-Supervision w/o AD  Other Standing Knee Exercises Retro Walking 2 RT      Physical Therapy Assessment and Plan PT Assessment and Plan Clinical Impression Statement: Pt referred for wound care to her R foot.  She has mild necrotic tissue to her portal and increased erythema and will benefit from skilled PT in order to address the above impairments in order to improve skin integrity prior to her R ankle fusion surgery.  Pt tolerated her exercises well today and was able to ambulate w/supervision w/o AD x8 minutes  with cueing for posture.  Rehab Potential: Good PT Frequency: Min 3X/week (Wound Care) PT Duration:  (2 weeks/ Wound Care) PT Plan: Continue to dress R foot and ankle wound and continue with balance exercises and improving  metatarsal and toe mobility.    Goals    Problem List Patient Active Problem List  Diagnoses  . Femoral shaft fracture  . Tibial plateau fracture  . Difficulty in walking  . Muscle weakness (generalized)       Jamin Panther 09/06/2011, 12:07 PM

## 2011-09-09 ENCOUNTER — Ambulatory Visit (HOSPITAL_COMMUNITY)
Admission: RE | Admit: 2011-09-09 | Discharge: 2011-09-09 | Disposition: A | Discharge status: Home / Self Care | Payer: No Typology Code available for payment source | Source: Ambulatory Visit | Attending: Pediatrics | Admitting: Pediatrics

## 2011-09-09 NOTE — Progress Notes (Signed)
Physical Therapy Treatment Patient Details  Name: AHUVA POYNOR MRN: [id number] Date of Birth: 01-26-1971  Today's Date: 09/09/2011 Time: 4098-1191 Time Calculation (min): 40 min Visit#: 12  of 16   For therex/gait;   2 of 6 for woundcare Re-eval: 09/13/11 for therex/gait; 09/20/11 for woundcare Charges:  Gait 10', therex 12', woundcare 15'    Subjective: Symptoms/Limitations Symptoms: Pt. states she went to the football game Saturday and done alot of walking.  States her L foot is still bothering her more than the right.  Returns to MD 10/16 to schedule her ankle fusion for R ankle. Pain Assessment Currently in Pain?: Yes Pain Score:   8 Pain Location: Foot Pain Orientation: Left   Exercise/Treatments Gait in department with SPC/and w/o AD 10 minutes Seated: Cybex Quad  1.5pl 2X10 reps Bilateral LE's Cybex Hamstring 3.0pl 2X10 reps Bilateral LE's Standing: Retro Ambulation 2RT Tandem Gait 1RT Sidestepping 1RT   Woundcare #1: Medial aspect of the ankle: Unknown depth with 100% slough  #2: Lateral heel portal: appears to be healed at this point   #3 dorsal aspect of the metatarsals:  Appears to be healed at this point  Irrigation and debridement w/gauze and forceps to clear dead skin and packed  #1 with small piece of gauze infused with hydrogel, covered with 2X2 and wrapped with 3"conform and secured with #7 netting.  Manual Therapy Other Manual Therapy: Woundcare, PROM to digits and forefoot R foot  Physical Therapy Assessment and Plan PT Assessment and Plan Clinical Impression Statement: Pt. received strengthening, ambulation and woundcare during therapy today.  Pt. tolerated all very well. PT Treatment/Interventions: Therapeutic exercise;Gait training (Woundcare Right Medial Ankle) PT Plan: Continue to work on stability; progress to outdoor ambulation using [facility] next visit if weather permits.     Problem List Patient Active Problem List  Diagnoses  . Femoral  shaft fracture  . Tibial plateau fracture  . Difficulty in walking  . Muscle weakness (generalized)    PT - End of Session Equipment Utilized During Treatment: Gait belt Activity Tolerance: Patient tolerated treatment well General Behavior During Session: [facility] for tasks performed Cognition: [facility] for tasks performed  Emeline Gins B 09/09/2011, 12:08 PM

## 2011-09-11 ENCOUNTER — Ambulatory Visit (HOSPITAL_COMMUNITY)
Admission: RE | Admit: 2011-09-11 | Discharge: 2011-09-11 | Disposition: A | Discharge status: Home / Self Care | Payer: No Typology Code available for payment source | Source: Ambulatory Visit

## 2011-09-11 NOTE — Progress Notes (Signed)
Physical Therapy Treatment Patient Details  Name: Yolanda [name] MRN: [id number] Date of Birth: [birth year]  Today's Date: 09/11/2011 Time: 7829-5621 Time Calculation (min): 62 min Visit#: 13  of 16   Re-eval: 09/13/11  Charge: selective debridement < 20 cm gait 15 min therex 20 min Ice 10 min  Subjective: Symptoms/Limitations Symptoms: Pt reported she got a call from dermatologist yesterday stating a staff infection in medial wound R LE,  Pain scale 5/10 B feet.  Returns to MD 10/16 to schedule her ankle fusion for R ankle Pain Assessment Currently in Pain?: Yes Pain Score:   5 Pain Location: Foot Pain Orientation: Right;Left  Objective: entered dept with boot on R LE and amb with RW.  Exercise/Treatments Woundcare  #1: Medial aspect of the ankle: Unknown depth with 100% slough  #2: Lateral heel portal: appears to be healed at this point  #3 dorsal aspect of the metatarsals: Appears to be healed at this point  Irrigation and debridement w/gauze and forceps to clear dead skin and packed #1 with small piece of gauze infused with hydrogel, covered with 2X2 and wrapped with 3"conform and secured with #7 netting.  Gait outdoors with incline/decline slope, curbs x3 with SPC/and w/o AD in department x 15 minutes  Seated:  Cybex Quad 1.5pl 2X10 reps Bilateral LE's  Cybex Hamstring 3.0pl 2X10 reps Bilateral LE's  Standing:  Retro Ambulation 2RT  Tandem Gait 1RT  Tandem stance 1x 30" each Sidestepping with blue tband 1RT   Modalities Modalities: Cryotherapy Manual Therapy Other Manual Therapy: Woundcare, PROM to digits and forefoot R foot Cryotherapy Number Minutes Cryotherapy: 10 Minutes Cryotherapy Location: Ankle (Both ankles) Type of Cryotherapy: Ice pack  Physical Therapy Assessment and Plan PT Assessment and Plan Clinical Impression Statement: Pt received woundcare, ambulation outdoors with SPC, strengthening/balance therapy today.  Outdoor amb complete with  standby assistance with min cueing for correct sequencing and correct hand placement.  Most difficulty with incline/decline slope, able to complete curbs up and down without diff.  Pt tolerated well towards total treatment though did state increased L foot pain with standing therex.   PT Plan: Continue to work on stability, balance and functional strengthening.    Goals    Problem List Patient Active Problem List  Diagnoses  . Femoral shaft fracture  . Tibial plateau fracture  . Difficulty in walking  . Muscle weakness (generalized)    PT - End of Session Activity Tolerance: Patient tolerated treatment well General Behavior During Session: [facility] for tasks performed Cognition: [facility] for tasks performed  Juel Burrow 09/11/2011, 12:01 PM

## 2011-09-13 ENCOUNTER — Ambulatory Visit (HOSPITAL_COMMUNITY): Payer: No Typology Code available for payment source | Admitting: Physical Therapy

## 2011-09-16 ENCOUNTER — Ambulatory Visit (HOSPITAL_COMMUNITY)
Admission: RE | Admit: 2011-09-16 | Discharge: 2011-09-16 | Disposition: A | Discharge status: Home / Self Care | Payer: No Typology Code available for payment source | Source: Ambulatory Visit | Attending: Pediatrics | Admitting: Pediatrics

## 2011-09-16 NOTE — Progress Notes (Signed)
Physical Therapy Evaluation  Patient Details  Name: Yolanda [name] MRN: [id number] Date of Birth: 1971-07-04  Today's Date: 09/16/2011 Time: 1100-1200 Time Calculation (min): 60 min Charges: 1 ROM, 1 MMT, 1 deb <20 cm, 10'TE, 10' manula Visit#: 14  of 16   Re-eval: 10/16/11 Assessment Next MD Visit: 09/19/11  Past Medical History: No past medical history on file. Past Surgical History: No past surgical history on file.  Subjective Symptoms/Limitations Symptoms: Pt reports that she walked independently with her boot on in the convience store which took about 5 minutes.  She continues to have the greatest difficulty with pain in both of her feet while ambulating, which increases with independent weight bearing.  Pain Assessment Pain Score:   5 Pain Location: Ankle Pain Orientation: Right;Left  Assessment RLE AROM (degrees) RLE Overall AROM Comments: R Knee:  0-115; R ankle: stuck in 24 degrees of PF;  Stuck in 5 degrees of inversion  RLE PROM (degrees) RLE Overall PROM Comments: Able to gain minimal passive range on R ankle DF, IN and EV.  RLE Strength Right Ankle Dorsiflexion: 2-/5 Right Ankle Plantar Flexion: 2-/5 Right Ankle Inversion: 1/5 Right Ankle Eversion: 1/5 LLE AROM (degrees) LLE Overall AROM Comments: Lacking 10 degrees of DF LLE Strength LLE Overall Strength Comments: WFL  Exercise/Treatments Stretches Gastroc Stretch: 3 reps;30 seconds;Limitations Gastroc Stretch Limitations: LLE only Standing SLS: 3x30 sec.  Best time: RLE 8 sec, LLE: 6 sec Gait Training: Independent x5' in gym  SLS: 3x30 sec.  Best time: RLE 8 sec, LLE: 6 sec  Wound Debridement: to medial R portal.  Irrigated and debridement with forceps and gauze.  Packed with hydrogel and saline infused gauze.  Saline gauze to top of irritated skin on proximal ankle and 2-3 DIP.    Manual Therapy: To increase R ankle IN/EV and DF w/grade I-III talocural and calcaneal joint mobs.   Physical  Therapy Assessment and Plan PT Assessment and Plan Clinical Impression Statement: Today's treatment with focus on progress note for MD and discussing functional goals, updating HEP , ROM and strength measurments.  She contines to have improved granulation to her R medial heel portal.  Very limited on R and L ankle ROM and increased pain with independent ambulation.  She has made terrific gains in hip and knee strength, but continues to have weakness in her B ankles which limits her amount of ROM she is able to attain.  Rehab Potential: Good PT Plan: Disscuss future therapy recommendation after MD appointment on 10/18, secondary to possibly R ankle fusion. Cont x2 more visits  (Re-eval completed today)    Goals PT Short Term Goals PT Short Term Goal 1: Pt will be independent with HEP  PT Short Term Goal 1 - Progress: Met PT Short Term Goal 2: Pt will demonstrate L SLS stance x10 sec independently  PT Short Term Goal 2 - Progress: Partly met PT Short Term Goal 3: Pt will ambulate 10 ft w/LRAD without an increase in pain  PT Short Term Goal 3 - Progress: Met PT Short Term Goal 4: Pt will improve R knee motion to WNL PT Short Term Goal 4 - Progress: Met PT Long Term Goals PT Long Term Goal 1: Pt will improve LE strength to [facility] in order to ambulate 10 minutes w/LRAD in order to return to active lifestyle.  PT Long Term Goal 1 - Progress: Partly met PT Long Term Goal 2: Pt will complete all basic transfers independently in order to improve quality  of life.   PT Long Term Goal 2 - Progress: Met Long Term Goal 3: Pt will ambulate x10 ft on uneven surface w/supervision w/LRAD in order to participate safely in outdoor activities.  Long Term Goal 3 Progress: Not met  Problem List Patient Active Problem List  Diagnoses  . Femoral shaft fracture  . Tibial plateau fracture  . Difficulty in walking  . Muscle weakness (generalized)        Roland Lipke 09/16/2011, 12:22 PM  Physician  Documentation Your signature is required to indicate approval of the treatment plan as stated above.  Please sign and either send electronically or make a copy of this report for your files and return this physician signed original.   Please mark one 1.__approve of plan  2. ___approve of plan with the following conditions.   ______________________________                                                          _____________________ Physician Signature                                                                                                             Date

## 2011-09-18 ENCOUNTER — Ambulatory Visit (HOSPITAL_COMMUNITY)
Admission: RE | Admit: 2011-09-18 | Discharge: 2011-09-18 | Disposition: A | Discharge status: Home / Self Care | Payer: No Typology Code available for payment source | Source: Ambulatory Visit | Attending: Pediatrics | Admitting: Pediatrics

## 2011-09-18 NOTE — Progress Notes (Signed)
Physical Therapy Treatment Patient Details  Name: Yolanda [name] MRN: [id number] Date of Birth: 08-16-1971  Today's Date: 09/18/2011 Time: 6578-4696 Time Calculation (min): 39 min Visit#: 15  of 16   Re-eval: 10/16/11 Charges:  Woundcare<20cm, manual 15'    Subjective: Symptoms/Limitations Symptoms: Pt. states both of her ankles to the bottoms of her feet are killing her.  Reports 8/10 pain today, and that's with pain meds.  Pt. wondering why feet are becoming more painful with ambulation. Pain Assessment Currently in Pain?: Yes Pain Score:   8 Pain Location: Ankle Pain Orientation: Right;Left  Woundcare: to medial R portal. Irrigated and debridement with forceps and gauze. Packed with hydrogel and saline infused gauze. Covered with kling and #5 netting.  Exercise/Treatments Manual Therapy Manual Therapy: Joint mobilization Other Manual Therapy: MFR and ROM to L foot/ankle to decrease pain.  ROM to R foot.  Physical Therapy Assessment and Plan PT Assessment and Plan Clinical Impression Statement: Held ambulation and therex today to focus on pain relief.  Good results with MFR to decrease adhesions.  Medial pin wound healing nicely.  Returns to surgeon tomorrow. PT Treatment/Interventions:  (Manual techniques and Woundcare) PT Plan: Await further MD orders.  Pt. has one visit remaining but may cancel if surgery scheduled tomorrow.     Problem List Patient Active Problem List  Diagnoses  . Femoral shaft fracture  . Tibial plateau fracture  . Difficulty in walking  . Muscle weakness (generalized)    PT - End of Session Activity Tolerance: Patient limited by pain General Behavior During Session: [facility] for tasks performed Cognition: [facility] for tasks performed  Bascom Levels, Erik Nessel B 09/18/2011, 12:12 PM

## 2011-09-19 DIAGNOSIS — S82839A Other fracture of upper and lower end of unspecified fibula, initial encounter for closed fracture: Secondary | ICD-10-CM | POA: Insufficient documentation

## 2011-09-19 DIAGNOSIS — M19079 Primary osteoarthritis, unspecified ankle and foot: Secondary | ICD-10-CM | POA: Insufficient documentation

## 2011-09-20 ENCOUNTER — Ambulatory Visit (HOSPITAL_COMMUNITY)
Admission: RE | Admit: 2011-09-20 | Discharge: 2011-09-20 | Disposition: A | Discharge status: Home / Self Care | Payer: No Typology Code available for payment source | Source: Ambulatory Visit | Attending: Pediatrics | Admitting: Pediatrics

## 2011-09-20 ENCOUNTER — Telehealth (HOSPITAL_COMMUNITY): Payer: Self-pay | Admitting: Physical Therapy

## 2011-09-20 NOTE — Progress Notes (Signed)
Physical Therapy Treatment Patient Details  Name: Yolanda [name] MRN: [id number] Date of Birth: 1971-01-17  Today's Date: 09/20/2011 Time: 4098-1191 Time Calculation (min): 41 min Charges: Manual: x30', TEx11' Visit#: 16  of 20   Re-eval: 10/16/11    Subjective: Symptoms/Limitations Symptoms: I went to go see Dr. Lorin Picket and he said my ankle surgery is on Novemeber 2nd.  He told me he is continuing with the fusion to help eliminate the pain in my foot.   Pain Assessment Currently in Pain?: Yes Pain Score:   5 Pain Location: Ankle Pain Orientation: Right;Left   Exercise/Treatments Stretching Gastroc Stretch 3x30 sec in seated and standing on incline board (w/RW) to increase DF to BLE Standing SLS: LLE 3x30 sec to improve endurance Seated Long Arc Quad: Right;15 reps Supine Straight Leg Raises: Right;15 reps Sidelying Hip ABduction: Right;15 reps Hip ADduction: Right;15 reps Prone  Hamstring Curl: 15 reps;Limitations Hamstring Curl Limitations: RLE Hip Extension: Right;15 reps   Ankle Exercises Ankle Dorsiflexion: AROM;Left;15 reps;Theraband Theraband Level (Ankle Dorsiflexion): Level 2 (Red) Ankle Plantar Flexion: AROM;15 reps;Left;Theraband Theraband Level (Ankle Plantar Flexion): Level 2 (Red) Ankle Eversion: Left;15 reps;Theraband Theraband Level (Ankle Eversion): Level 2 (Red) Ankle Inversion: Left;15 reps;Theraband Theraband Level (Ankle Inversion): Level 2 (Red) Heel Raises: 20 reps;Limitations Heel Raises Limitations: Seated BLE Toe Raise: 20 reps;Limitations Toe Raise Limitations: Seated BLE  SLS: LLE 3x30 sec to improve endurance  Manual Therapy Edema Management: L foot: STM w/Grade I-II joint mobs to increase metatarsal and talar mobility DF, PF, IN and EV).  x30 minutes  Physical Therapy Assessment and Plan PT Assessment and Plan Clinical Impression Statement: Pt continues to be limited by pain and has increased difficulty with L SLS, which is  imparitive secondary to upcoming RLE surgery and will be NWB for some time.  Today's treatment focus on decreasing L foot pain and setting up her RLE HEP for after ankle surgery to continue with RLE strength and decrease risk of atrophy.  Left a message with Dr. Rondel Baton office [phone]) to contact about whether to proceed with therapy or D/C until after ankle fusion from Dr. Lorin Picket. PT Plan: Contacted Dr. Hyacinth Meeker, follow up with her if we do not here from her by next visit: [phone].  Continue to decrease L foot pain and improve LLE balance and ROM.     Goals    Problem List Patient Active Problem List  Diagnoses  . Femoral shaft fracture  . Tibial plateau fracture  . Difficulty in walking  . Muscle weakness (generalized)    PT - End of Session Activity Tolerance: Patient limited by pain  Yolanda [name] 09/20/2011, 12:13 PM

## 2011-09-23 ENCOUNTER — Ambulatory Visit (HOSPITAL_COMMUNITY)
Admission: RE | Admit: 2011-09-23 | Discharge: 2011-09-23 | Disposition: A | Discharge status: Home / Self Care | Payer: No Typology Code available for payment source | Source: Ambulatory Visit | Attending: Pediatrics | Admitting: Pediatrics

## 2011-09-23 NOTE — Progress Notes (Addendum)
Physical Therapy Treatment Patient Details  Name: Yolanda [name] MRN: [id number] Date of Birth: 1971-02-10  Today's Date: 09/23/2011 Time: 4098-1191 Time Calculation (min): 38 min Visit#: 17  of 20   Re-eval:    charge:  Wound care< 20; there ex 25 Subjective: Symptoms/Limitations Symptoms: My left ankle is hurting they are trying to improve my pain in my L so that the fusion goes better. Pain Assessment Currently in Pain?: Yes Pain Score:   5 Pain Location: Ankle Pain Orientation: Left;Right Pain Type: Chronic pain Pain Onset: More than a month ago        Exercise/Treatments Stretches-  Standing:  Gastroc stretch 3x 30 sec; Slant board stretch; SLS L LE pt max 20 sec. With two small finger holds.  Sitting:   Baps board L3;  LAQ with 5# x 15 rep; SLR x 15; T-band exercises for ankle with blue T-band. Wound care to R LE pt states dressing has not been changed for a week so it is difficult to state how much the wound is draining.  The wound is a small open area which can be cleansed daily and have a bandaide placed over it.  Discharge wound care .          Manual Therapy Manual Therapy: Joint mobilization Joint Mobilization: Jt mob to increase ROM. Weight Bearing Technique Weight Bearing Technique: No  Physical Therapy Assessment and Plan PT Assessment and Plan Clinical Impression Statement: Pt wound on L LE is pin point D/C wound care patient able to wash and bandage at home.  Pt  completed exercises without difficulty,.  Increased to blue T-band PT Plan: Continue to work L ankle begin sidelying Inversion/eversion with 3# wt.      Goals    Problem List Patient Active Problem List  Diagnoses  . Femoral shaft fracture  . Tibial plateau fracture  . Difficulty in walking  . Muscle weakness (generalized)    PT - End of Session Activity Tolerance: Patient tolerated treatment well General Behavior During Session: [facility] for tasks performed Cognition: [facility] for tasks  performed  Brehanna Deveny,CINDY 09/23/2011, 4:51 PM

## 2011-09-24 ENCOUNTER — Ambulatory Visit (HOSPITAL_COMMUNITY)
Admission: RE | Admit: 2011-09-24 | Discharge: 2011-09-24 | Disposition: A | Discharge status: Home / Self Care | Payer: No Typology Code available for payment source | Source: Ambulatory Visit | Attending: Pediatrics | Admitting: Pediatrics

## 2011-09-24 NOTE — Progress Notes (Signed)
Physical Therapy Treatment Patient Details  Name: Yolanda [name] MRN: [id number] Date of Birth: 03/11/1971  Today's Date: 09/24/2011 Time: 7829-5621 Time Calculation (min): 44 min Visit#: 18  of 20   Re-eval: 10/16/11 Charges: Therex x 25' Manual therapy 15'  Subjective: Symptoms/Limitations Symptoms: "She worked me hard yesterday and my feet and toes are sore and painful.  I am not sure why my toes are burning, but I have feeling in them. " Explained to pt importance of burning sensation in regards to nerve healing.  Pain Assessment Currently in Pain?: Yes Pain Score:   6 Pain Location: Foot Pain Orientation: Right;Left  Exercises:  Seated Long Arc Quad: 20 reps;Weights Long Arc Quad Weight: 5 lbs.   Ankle Exercises Ankle Dorsiflexion: Strengthening;Left;15 reps;Supine;Theraband Theraband Level (Ankle Dorsiflexion): Level 4 (Blue) Ankle Plantar Flexion: Strengthening;15 reps;Theraband Theraband Level (Ankle Plantar Flexion): Level 4 (Blue) Ankle Eversion: Strengthening;15 reps;Supine Ankle Inversion: Strengthening;Left;15 reps;Theraband Theraband Level (Ankle Inversion): Level 4 (Blue)  BAPS: Level 3;15 reps;Sitting SLS: 3x30"   Manual Therapy Manual Therapy: Edema management Edema Management: Retro massage to B LE to decrease swelling Joint Mobilization: Grade I-II joint mobs to increase metatarsal and talar mobility DF, PF, IN and EV  Physical Therapy Assessment and Plan PT Assessment and Plan Clinical Impression Statement: Left ankle easily fatigued with SLS. PT completes BAPS board with minimal difficulty and VC's to avoid knee motione. Manual completed to decrease pain and swelling and to increase ROM. PT Treatment/Interventions: Therapeutic exercise;Other (comment) (Manual) PT Plan: Continue to progress per PT POC,     Problem List Patient Active Problem List  Diagnoses  . Femoral shaft fracture  . Tibial plateau fracture  . Difficulty in walking  .  Muscle weakness (generalized)    PT - End of Session Activity Tolerance: Patient tolerated treatment well General Behavior During Session: [facility] for tasks performed Cognition: [facility] for tasks performed  Antonieta Iba 09/24/2011, 9:51 AM

## 2011-09-24 NOTE — Progress Notes (Signed)
Called Dr. Lucita Ferrara @ [phone] to discuss therapy for ankle. 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

## 2011-09-25 ENCOUNTER — Ambulatory Visit (HOSPITAL_COMMUNITY)
Admission: RE | Admit: 2011-09-25 | Discharge: 2011-09-25 | Disposition: A | Discharge status: Home / Self Care | Payer: No Typology Code available for payment source | Source: Ambulatory Visit | Attending: Pediatrics | Admitting: Pediatrics

## 2011-09-25 NOTE — Progress Notes (Signed)
Physical Therapy Treatment Patient Details  Name: SHAVONDA WIEDMAN MRN: [id number] Date of Birth: July 07, 1971  Today's Date: 09/25/2011 Time: 9811-9147 Time Calculation (min): 32 min Charges: 24' TE', 8' Manual  Visit#: 19  of 20   Re-eval: 10/16/11   Subjective: Symptoms/Limitations Symptoms: Pt reports that she is feeling pretty good.  Continues to have increased pain and swelling. Objective: Pt comes in today without an AD and w/CAM boot Pain Assessment Pain Score:   3 Pain Location: Foot Pain Orientation: Right;Left   Exercise/Treatments Stretches Gastroc Stretch: 3 reps;30 seconds Gastroc Stretch Limitations: Seated BLE Standing SLS: 3x30" w/mod A Seated Other Seated Knee Exercises: Heel and Toe Raises 20x Towel Crunch: Limitations Towel Crunch Limitations: 1 minute x 2, unable to grip towel with either LE. SLS: 3x30" w/mod A  Manual: Retro massage to B LE to decrease swelling    Physical Therapy Assessment and Plan PT Assessment and Plan Clinical Impression Statement: Pt comes in 15 minutes late today.  She continues to have difficulty with toe and ankle mobility to BLE.  Had decreased edema after manual techniques.  PT Plan: Called Dr. Lorin Picket Nurse yesterday.  Her diagnosis is R subtalar OA and R Tibial Plafond fracture w/fibular involvement.  1 more week and plan to D/C secondary to ankle fusion     Goals    Problem List Patient Active Problem List  Diagnoses  . Femoral shaft fracture  . Tibial plateau fracture  . Difficulty in walking  . Muscle weakness (generalized)       Aalyah Mansouri 09/25/2011, 4:07 PM

## 2011-09-26 DIAGNOSIS — S52209A Unspecified fracture of shaft of unspecified ulna, initial encounter for closed fracture: Secondary | ICD-10-CM | POA: Insufficient documentation

## 2011-09-26 DIAGNOSIS — S42409A Unspecified fracture of lower end of unspecified humerus, initial encounter for closed fracture: Secondary | ICD-10-CM | POA: Insufficient documentation

## 2011-09-30 ENCOUNTER — Ambulatory Visit (HOSPITAL_COMMUNITY)
Admission: RE | Admit: 2011-09-30 | Discharge: 2011-09-30 | Disposition: A | Discharge status: Home / Self Care | Payer: No Typology Code available for payment source | Source: Ambulatory Visit | Attending: Pediatrics | Admitting: Pediatrics

## 2011-09-30 NOTE — Patient Instructions (Signed)
Pt to begin to walk NWB R LE on walker at home today working for increased tolerance for NWB as pt will be NWB for 10 weeks after fusion.

## 2011-09-30 NOTE — Progress Notes (Signed)
Physical Therapy Treatment Patient Details  Name: Yolanda [name] MRN: [id number] Date of Birth: [birth year]  Today's Date: 09/30/2011 Time: 0865-7846 Time Calculation (min): 35 min Visit#: 20  of 22   Re-eval: 10/16/11  charge there ex 26; manual 9  Subjective: Symptoms/Limitations Symptoms: Pt continutes to use no AD.  Pt states that she is having the fusion this Friday. Pain Assessment Currently in Pain?: Yes Pain Score:   7 Pain Location: Ankle Pain Orientation: Left Pain Type: Chronic pain Pain Radiating Towards: midtalar      Exercise/Treatments Ankle Exercises:  Standing SLS on L LE 5x x 20 sec   R LE isometrics x 10 L LE: Ankle Plantar Flexion Limitations: Prone with 2# Side-lying:  Inversion/ Eversion with 2# x 10. Sitting: Towel Crunch Limitations: 1 minute x 2,  BAPS: Level 3;Sitting;10 reps (sitting gastroc stretch on L LE with slant board x 30sec x 3)  Manual Therapy Manual Therapy: Joint mobilization Edema Management: retromassage and subtalar jt mob to L LE as well as contract relax to increase ROM   Physical Therapy Assessment and Plan PT Assessment and Plan Clinical Impression Statement: Pt completed exercises for increased ROM and strength to L LE; strengthening only on R.   Clinical Impairments Affecting Rehab Potential: decreased ROM, strength increased edema. PT Plan: See two more treatments only then D/C secondary to fusion on R LE on Friday.    Goals  increase ROM and strength to increase functional ability.  Problem List Patient Active Problem List  Diagnoses  . Femoral shaft fracture  . Tibial plateau fracture  . Difficulty in walking  . Muscle weakness (generalized)    PT - End of Session Activity Tolerance: Patient tolerated treatment well General Behavior During Session: [facility] for tasks performed Cognition: [facility] for tasks performed  RUSSELL,CINDY 09/30/2011, 10:41 AM

## 2011-10-02 ENCOUNTER — Ambulatory Visit (HOSPITAL_COMMUNITY)
Admission: RE | Admit: 2011-10-02 | Discharge: 2011-10-02 | Disposition: A | Discharge status: Home / Self Care | Payer: No Typology Code available for payment source | Source: Ambulatory Visit | Attending: Pediatrics | Admitting: Pediatrics
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
## 2011-10-03 ENCOUNTER — Ambulatory Visit (HOSPITAL_COMMUNITY): Payer: No Typology Code available for payment source | Admitting: *Deleted

## 2011-10-04 DIAGNOSIS — Z981 Arthrodesis status: Secondary | ICD-10-CM | POA: Insufficient documentation

## 2011-12-31 DIAGNOSIS — S93326A Dislocation of tarsometatarsal joint of unspecified foot, initial encounter: Secondary | ICD-10-CM | POA: Insufficient documentation

## 2012-03-02 DIAGNOSIS — M545 Low back pain, unspecified: Secondary | ICD-10-CM | POA: Insufficient documentation

## 2012-04-15 ENCOUNTER — Ambulatory Visit (HOSPITAL_COMMUNITY)
Admission: RE | Admit: 2012-04-15 | Discharge: 2012-04-15 | Disposition: A | Discharge status: Home / Self Care | Payer: No Typology Code available for payment source | Source: Ambulatory Visit | Attending: Orthopedic Surgery | Admitting: Orthopedic Surgery

## 2012-04-15 DIAGNOSIS — M545 Low back pain, unspecified: Secondary | ICD-10-CM | POA: Insufficient documentation

## 2012-04-15 DIAGNOSIS — IMO0001 Reserved for inherently not codable concepts without codable children: Secondary | ICD-10-CM | POA: Insufficient documentation

## 2012-04-15 DIAGNOSIS — M6281 Muscle weakness (generalized): Secondary | ICD-10-CM | POA: Insufficient documentation
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
## 2012-04-21 ENCOUNTER — Ambulatory Visit (HOSPITAL_COMMUNITY)
Admission: RE | Admit: 2012-04-21 | Discharge: 2012-04-21 | Disposition: A | Discharge status: Home / Self Care | Payer: No Typology Code available for payment source | Source: Ambulatory Visit | Attending: Pediatrics | Admitting: Pediatrics

## 2012-04-21 NOTE — Progress Notes (Signed)
Physical Therapy Treatment Patient Details  Name: Yolanda [name] MRN: [id number] Date of Birth: 05-29-1971  Today's Date: 04/21/2012 Time: 4098-1191 PT Time Calculation (min): 53 min Visit#: 2  of 24   Re-eval: 05/15/12 Charges: Therex x 39' Ice x 10'  Authorization: Medicaid    Subjective: Symptoms/Limitations Symptoms: I always have pain. Pain Assessment Currently in Pain?: Yes Pain Score:   7 Pain Location: Back Pain Orientation: Lower Pain Radiating Towards: Going through B hips and legs   Exercise/Treatments Stretches Single Knee to Chest Stretch: 3 reps;30 seconds Standing Extension: 5 reps Seated Other Seated Lumbar Exercises: pelvic floor x 10 Other Seated Lumbar Exercises: transverse ab x 10 Supine Glut Set: 10 reps Bent Knee Raise: 10 reps Bridge: 10 reps Sidelying Clam: 5 reps;Limitations Clam Limitations: 10" holds Hip Abduction: 10 reps Prone  Straight Leg Raise: 10 reps Other Prone Lumbar Exercises: poe x 1'30"  Physical Therapy Assessment and Plan PT Assessment and Plan Clinical Impression Statement: Pt reports increased LBP with POE. Pt able to tolerate POE for 1'30" before she had to stop secondary to pain. Began knee to chest stretch to relieve pain caused by POE. Applied ice pack to lower back to decrease 8/10 pain after therex. Pt reports LBP decrease to 5/10 at end of session. PT Plan: Continue to progress per PT POC. Progress prone therex next session.     Problem List Patient Active Problem List  Diagnoses  . Femoral shaft fracture  . Tibial plateau fracture  . Difficulty in walking  . Muscle weakness (generalized)    PT - End of Session Activity Tolerance: Patient tolerated treatment well General Behavior During Session: [facility] for tasks performed Cognition: [facility] for tasks performed   Seth Bake, PTA 04/21/2012, 9:20 AM

## 2012-04-23 ENCOUNTER — Ambulatory Visit (HOSPITAL_COMMUNITY)
Admission: RE | Admit: 2012-04-23 | Discharge: 2012-04-23 | Disposition: A | Discharge status: Home / Self Care | Payer: No Typology Code available for payment source | Source: Ambulatory Visit | Attending: Pediatrics | Admitting: Pediatrics

## 2012-04-23 NOTE — Progress Notes (Signed)
Physical Therapy Treatment Patient Details  Name: Yolanda [name] MRN: [id number] Date of Birth: Dec 11, 1970  Today's Date: 04/23/2012 Time: 4696-2952 PT Time Calculation (min): 43 min Visit#: 3  of 24   Re-eval: 05/15/12 Charges: Therex x 39'    Subjective: Symptoms/Limitations Symptoms: My feet are really hurting today. I think I did too much yesterday. Pain Assessment Currently in Pain?: Yes Pain Score:   3 Pain Location: Back Pain Orientation: Lower Multiple Pain Sites: Yes   Exercise/Treatments Stretches Passive Hamstring Stretch: 3 reps;30 seconds;Limitations Passive Hamstring Stretch Limitations: with rope Single Knee to Chest Stretch: 3 reps;30 seconds Double Knee to Chest Stretch: Limitations Double Knee to Chest Stretch Limitations: sitting lumbar flexion stretch 5x10" Standing Scapular Retraction: 10 reps;Theraband Theraband Level (Scapular Retraction): Level 3 (Green) Row: 10 reps;Theraband Theraband Level (Row): Level 3 (Green) Shoulder Extension: 10 reps;Theraband Theraband Level (Shoulder Extension): Level 3 (Green) Seated Other Seated Lumbar Exercises: pelvic floor x 10 Other Seated Lumbar Exercises: transverse ab x 10 Supine Glut Set: 10 reps Bent Knee Raise: 10 reps Bridge: 10 reps Sidelying Clam: 5 reps;Limitations Clam Limitations: 10" holds Hip Abduction: 10 reps Prone  Straight Leg Raise: 10 reps  Physical Therapy Assessment and Plan PT Assessment and Plan Clinical Impression Statement: Pt appears to have more relief with lumbar flexion compared to extension. Began seated lumbar flexion stretch to decrease pain and scapular tband exercises to improve posture. Pt presents with increased antalgia this session secondary to increased foot pain.  PT Plan: Continue to progress per PT POC.     Problem List Patient Active Problem List  Diagnoses  . Femoral shaft fracture  . Tibial plateau fracture  . Difficulty in walking  . Muscle weakness  (generalized)    PT - End of Session Activity Tolerance: Patient tolerated treatment well General Behavior During Session: [facility] for tasks performed Cognition: [facility] for tasks performed    Seth Bake, PTA 04/23/2012, 8:57 AM

## 2012-04-29 ENCOUNTER — Ambulatory Visit (HOSPITAL_COMMUNITY)
Admission: RE | Admit: 2012-04-29 | Discharge: 2012-04-29 | Disposition: A | Discharge status: Home / Self Care | Payer: No Typology Code available for payment source | Source: Ambulatory Visit | Attending: Orthopedic Surgery | Admitting: Orthopedic Surgery

## 2012-04-29 NOTE — Progress Notes (Signed)
Physical Therapy Treatment Patient Details  Name: Yolanda [name] MRN: [id number] Date of Birth: 1971-07-14  Today's Date: 04/29/2012 Time: 4098-1191 PT Time Calculation (min): 48 min  Visit#: 4  of 24   Re-eval: 05/15/12    Authorization: [employer] Pt given Betty's number, signed form  Authorization Time Period:    Authorization Visit#:   of     Subjective: Symptoms/Limitations Symptoms: My back, leg and foot are a 10 today. Pain Assessment Currently in Pain?: Yes Pain Score: 10-Worst pain ever (Pt refuses to go to ER) Pain Location: Back   Exercise/Treatments Mobility/Balance  Ambulation/Gait Ambulation/Gait: Yes Ambulation/Gait Assistance: 6: Modified independent (Device/Increase time) Ambulation Distance (Feet): 200 Feet (Gt trained with cane to attempt to take pressure of foot and)    Standing Scapular Retraction: 10 reps;Theraband Theraband Level (Scapular Retraction): Level 3 (Green) Row: 10 reps;Theraband Theraband Level (Row): Level 3 (Green) Shoulder Extension: 10 reps;Theraband Theraband Level (Shoulder Extension): Level 3 (Green) Seated   Supine Other Supine Lumbar Exercises: pelvic tilt x 10 Other Supine Lumbar Exercises: decompressive ex Sidelying Clam: 5 reps;Limitations Clam Limitations: 10" holds Hip Abduction: 10 reps Prone  Straight Leg Raise: 10 reps Opposite Arm/Leg Raise: 10 reps Other Prone Lumbar Exercises:  (double arm raise x 10) Quadruped       Physical Therapy Assessment and Plan PT Assessment and Plan Clinical Impression Statement: Pt completed opposite arm/leg as well as decompressive ex.  Given HEP for the above as well as T-band as medicaid only approves 3 visits.   PT Plan: Pt is going to talk to Lubertha Basque about any financial assistance she might be able to get.    Goals    Problem List Patient Active Problem List  Diagnoses  . Femoral shaft fracture  . Tibial plateau fracture  . Difficulty in walking  . Muscle  weakness (generalized)    PT - End of Session Activity Tolerance: Patient tolerated treatment well General Cognition: [facility] for tasks performed  GP No functional reporting required  Andry Bogden,CINDY 04/29/2012, 2:38 PM

## 2012-05-20 ENCOUNTER — Other Ambulatory Visit: Payer: Self-pay | Admitting: Obstetrics & Gynecology

## 2012-05-20 MED ORDER — KETOROLAC TROMETHAMINE 30 MG/ML IJ SOLN: 30.0000 mg | Freq: Once | INTRAMUSCULAR | Status: DC

## 2012-05-21 NOTE — Patient Instructions (Addendum)
20 Yolanda [name]  05/21/2012   Your procedure is scheduled on:  05/29/12  Report to [facility] at 6:15 AM.  Call this number if you have problems the morning of surgery: [phone]   Remember:   Do not eat food or drink :After Midnight.      Take these medicines the morning of surgery with A SIP OF WATER: protonix,paxil,valium,neurontin,oxycodone,atenolol. Wear Fentanyl patch.   Do not wear jewelry, make-up or nail polish.  Do not wear lotions, powders, or perfumes. You may wear deodorant.  Do not shave 48 hours prior to surgery. Men may shave face and neck.  Do not bring valuables to the hospital.  Contacts, dentures or bridgework may not be worn into surgery.  Leave suitcase in the car. After surgery it may be brought to your room.  For patients admitted to the hospital, checkout time is 11:00 AM the day of discharge.   Patients discharged the day of surgery will not be allowed to drive home.  Name and phone number of your driver: family  Special Instructions: CHG Shower Use Special Wash: 1/2 bottle night before surgery and 1/2 bottle morning of surgery.   Please read over the following fact sheets that you were given: Pain Booklet, Coughing and Deep Breathing, MRSA Information, Surgical Site Infection Prevention, Anesthesia Post-op Instructions and Care and Recovery After Surgery  Hysteroscopy Hysteroscopy is a procedure used for looking inside the womb (uterus). It may be done for many different reasons, including:  To evaluate abnormal bleeding, fibroid (benign, noncancerous) tumors, polyps, scar tissue (adhesions), and possibly cancer of the uterus.   To look for lumps (tumors) and other uterine growths.   To look for causes of why a woman cannot get pregnant (infertility), causes of recurrent loss of pregnancy (miscarriages), or a lost intrauterine device (IUD).   To perform a sterilization by blocking the fallopian tubes from inside the uterus.  A hysteroscopy should be  done right after a menstrual period to be sure you are not pregnant. LET YOUR CAREGIVER KNOW ABOUT:   Allergies.   Medicines taken, including herbs, eyedrops, over-the-counter medicines, and creams.   Use of steroids (by mouth or creams).   Previous problems with anesthetics or numbing medicines.   History of bleeding or blood problems.   History of blood clots.   Possibility of pregnancy, if this applies.   Previous surgery.   Other health problems.  RISKS AND COMPLICATIONS   Putting a hole in the uterus.   Excessive bleeding.   Infection.   Damage to the cervix.   Injury to other organs.   Allergic reaction to medicines.   Too much fluid used in the uterus for the procedure.  BEFORE THE PROCEDURE   Do not take aspirin or blood thinners for a week before the procedure, or as directed. It can cause bleeding.   Arrive at least 60 minutes before the procedure or as directed to read and sign the necessary forms.   Arrange for someone to take you home after the procedure.   If you smoke, do not smoke for 2 weeks before the procedure.  PROCEDURE   Your caregiver may give you medicine to relax you. He or she may also give you a medicine that numbs the area around the cervix (local anesthetic) or a medicine that makes you sleep (general anesthesia).   Sometimes, a medicine is placed in the cervix the day before the procedure. This medicine makes the cervix have a larger opening (  dilate). This makes it easier for the instrument to be inserted into the uterus.   A small instrument (hysteroscope) is inserted through the vagina into the uterus. This instrument is similar to a pencil-sized telescope with a light.   During the procedure, air or a liquid is put into the uterus, which allows the surgeon to see better.   Sometimes, tissue is gently scraped from inside the uterus. These tissue samples are sent to a specialist who looks at tissue samples (pathologist). The  pathologist will give a report to your caregiver. This will help your caregiver decide if further treatment is necessary. The report will also help your caregiver decide on the best treatment if the test comes back abnormal.  AFTER THE PROCEDURE   If you had a general anesthetic, you may be groggy for a couple hours after the procedure.   If you had a local anesthetic, you will be advised to rest at the surgical center or caregiver's office until you are stable and feel ready to go home.   You may have some cramping for a couple days.   You may have bleeding, which varies from light spotting for a few days to menstrual-like bleeding for up to 3 to 7 days. This is normal.   Have someone take you home.  FINDING OUT THE RESULTS OF YOUR TEST Not all test results are available during your visit. If your test results are not back during the visit, make an appointment with your caregiver to find out the results. Do not assume everything is normal if you have not heard from your caregiver or the medical facility. It is important for you to follow up on all of your test results. HOME CARE INSTRUCTIONS   Do not drive for 24 hours or as instructed.   Only take over-the-counter or prescription medicines for pain, discomfort, or fever as directed by your caregiver.   Do not take aspirin. It can cause or aggravate bleeding.   Do not drive or drink alcohol while taking pain medicine.   You may resume your usual diet.   Do not use tampons, douche, or have sexual intercourse for 2 weeks, or as advised by your caregiver.   Rest and sleep for the first 24 to 48 hours.   Take your temperature twice a day for 4 to 5 days. Write it down. Give these temperatures to your caregiver if they are abnormal (above 98.6 F or 37.0 C).   Take medicines your caregiver has ordered as directed.   Follow your caregiver's advice regarding diet, exercise, lifting, driving, and general activities.   Take showers  instead of baths for 2 weeks, or as recommended by your caregiver.   If you develop constipation:   Take a mild laxative with the advice of your caregiver.   Eat bran foods.   Drink enough water and fluids to keep your urine clear or pale yellow.   Try to have someone with you or available to you for the first 24 to 48 hours, especially if you had a general anesthetic.   Make sure you and your family understand everything about your operation and recovery.   Follow your caregiver's advice regarding follow-up appointments and Pap smears.  SEEK MEDICAL CARE IF:   You feel dizzy or lightheaded.   You feel sick to your stomach (nauseous).   You develop abnormal vaginal discharge.   You develop a rash.   You have an abnormal reaction or allergy to your medicine.  You need stronger pain medicine.  SEEK IMMEDIATE MEDICAL CARE IF:   Bleeding is heavier than a normal menstrual period or you have blood clots.   You have an oral temperature above 102 F (38.9 C), not controlled by medicine.   You have increasing cramps or pains not relieved with medicine.   You develop belly (abdominal) pain that does not seem to be related to the same area of earlier cramping and pain.   You pass out.   You develop pain in the tops of your shoulders (shoulder strap areas).   You develop shortness of breath.  MAKE SURE YOU:   Understand these instructions.   Will watch your condition.   Will get help right away if you are not doing well or get worse.  Document Released: 02/24/2001 Document Revised: 11/07/2011 Document Reviewed: 06/19/2009 [facility] Patient Information 2012 [place], [place].  Dilation and Curettage or Vacuum Curettage Dilation and curettage (D&C) and vacuum curettage are minor procedures. A D&C involves stretching (dilation) the cervix and scraping (curettage) the inside lining of the womb (uterus). During a D&C, tissue is gently scraped from the inside lining of the uterus.  During a vacuum curettage, the lining and tissue in the uterus are removed with the use of gentle suction. Curettage may be performed for diagnostic or therapeutic purposes. As a diagnostic procedure, curettage is performed for the purpose of examining tissues from the uterus. Tissue examination may help determine causes or treatment options for symptoms. A diagnostic curettage may be performed for the following symptoms:  Irregular bleeding in the uterus.   Bleeding with the development of clots.   Spotting between menstrual periods.   Prolonged menstrual periods.   Bleeding after menopause.   No menstrual period (amenorrhea).   A change in size and shape of the uterus.  A therapeutic curettage is performed to remove tissue, blood, or a contraceptive device. Therapeutic curettage may be performed for the following conditions:   Removal of an IUD (intrauterine device).   Removal of retained placenta after giving birth. Retained placenta can cause bleeding severe enough to require transfusions or an infection.   Abortion.   Miscarriage.   Removal of polyps inside the uterus.   Removal of uncommon types of fibroids (noncancerous lumps).  LET YOUR CAREGIVER KNOW ABOUT:   Allergies to food or medicine.   Medicines taken, including vitamins, herbs, eyedrops, over-the-counter medicines, and creams.   Use of steroids (by mouth or creams).   Previous problems with anesthetics or numbing medicines.   History of bleeding problems or blood clots.   Previous surgery.   Other health problems, including diabetes and kidney problems.   Possibility of pregnancy, if this applies.  RISKS AND COMPLICATIONS   Excessive bleeding.   Infection of the uterus.   Damage to the cervix.   Development of scar tissue (adhesions) inside the uterus, later causing abnormal amounts of menstrual bleeding.   Complications from the general anesthetic, if a general anesthetic is used.   Putting a  hole (perforation) in the uterus. This is rare.  BEFORE THE PROCEDURE   Eat and drink before the procedure only as directed by your caregiver.   Arrange for someone to take you home.  PROCEDURE   This procedure may be done in a hospital, outpatient clinic, or caregiver's office.   You may be given a general anesthetic or a local anesthetic in and around the cervix.   You will lie on your back with your legs in stirrups.  There are two ways in which your cervix can be softened and dilated. These include:   Taking a medicine.   Having thin rods (laminaria) inserted into your cervix.   A curved tool (curette) will scrape cells from the inside lining of the uterus and will then be removed.  This procedure usually takes about 15 to 30 minutes. AFTER THE PROCEDURE   You will rest in the recovery area until you are stable and are ready to go home.   You will need to have someone take you home.   You may feel sick to your stomach (nauseous) or throw up (vomit) if you had general anesthesia.   You may have a sore throat if a tube was placed in your throat during general anesthesia.   You may have light cramping and bleeding for 2 days to 2 weeks after the procedure.   Your uterus needs to make a new lining after the procedure. This may make your next period late.  Document Released: 11/18/2005 Document Revised: 11/07/2011 Document Reviewed: 06/16/2009 [facility] Patient Information 2012 [place], [place].   PATIENT INSTRUCTIONS POST-ANESTHESIA  IMMEDIATELY FOLLOWING SURGERY:  Do not drive or operate machinery for the first twenty four hours after surgery.  Do not make any important decisions for twenty four hours after surgery or while taking narcotic pain medications or sedatives.  If you develop intractable nausea and vomiting or a severe headache please notify your doctor immediately.  FOLLOW-UP:  Please make an appointment with your surgeon as instructed. You do not need to follow  up with anesthesia unless specifically instructed to do so.  WOUND CARE INSTRUCTIONS (if applicable):  Keep a dry clean dressing on the anesthesia/puncture wound site if there is drainage.  Once the wound has quit draining you may leave it open to air.  Generally you should leave the bandage intact for twenty four hours unless there is drainage.  If the epidural site drains for more than 36-48 hours please call the anesthesia department.  QUESTIONS?:  Please feel free to call your physician or the hospital operator if you have any questions, and they will be happy to assist you.

## 2012-05-22 ENCOUNTER — Encounter (HOSPITAL_COMMUNITY)
Admission: RE | Admit: 2012-05-22 | Discharge: 2012-05-22 | Disposition: A | Discharge status: Home / Self Care | Payer: Medicaid Other | Source: Ambulatory Visit | Attending: Obstetrics & Gynecology | Admitting: Obstetrics & Gynecology

## 2012-05-22 ENCOUNTER — Encounter (HOSPITAL_COMMUNITY): Payer: Self-pay

## 2012-05-22 ENCOUNTER — Encounter (HOSPITAL_COMMUNITY): Payer: Self-pay | Admitting: Pharmacy Technician

## 2012-05-22 HISTORY — DX: Unspecified osteoarthritis, unspecified site: M19.90

## 2012-05-22 HISTORY — DX: Gastro-esophageal reflux disease without esophagitis: K21.9

## 2012-05-22 HISTORY — DX: Anxiety disorder, unspecified: F41.9

## 2012-05-22 HISTORY — DX: Sleep apnea, unspecified: G47.30

## 2012-05-22 LAB — URINALYSIS, ROUTINE W REFLEX MICROSCOPIC
Bilirubin Urine: NEGATIVE
Glucose, UA: >1000 mg/dL — AB
Leukocytes, UA: NEGATIVE
Nitrite: NEGATIVE
Specific Gravity, Urine: 1.015 (ref 1.005–1.030)
pH: 5.5 (ref 5.0–8.0)

## 2012-05-22 LAB — SURGICAL PCR SCREEN
MRSA, PCR: NEGATIVE
Staphylococcus aureus: NEGATIVE

## 2012-05-22 LAB — CBC
MCHC: 34.2 g/dL (ref 30.0–36.0)
Platelets: 277 10*3/uL (ref 150–400)
RDW: 12.9 % (ref 11.5–15.5)
WBC: 9.8 10*3/uL (ref 4.0–10.5)

## 2012-05-22 LAB — COMPREHENSIVE METABOLIC PANEL
ALT: 28 U/L (ref 0–35)
AST: 29 U/L (ref 0–37)
Albumin: 3.5 g/dL (ref 3.5–5.2)
Alkaline Phosphatase: 175 U/L — ABNORMAL HIGH (ref 39–117)
BUN: 16 mg/dL (ref 6–23)
Potassium: 4.4 mEq/L (ref 3.5–5.1)
Sodium: 131 mEq/L — ABNORMAL LOW (ref 135–145)
Total Protein: 6.9 g/dL (ref 6.0–8.3)

## 2012-05-22 LAB — HCG, QUANTITATIVE, PREGNANCY: hCG, Beta Chain, Quant, S: 1 m[IU]/mL (ref <5)

## 2012-05-22 LAB — URINE MICROSCOPIC-ADD ON

## 2012-05-29 ENCOUNTER — Encounter (HOSPITAL_COMMUNITY): Payer: Self-pay | Admitting: Anesthesiology

## 2012-05-29 ENCOUNTER — Ambulatory Visit (HOSPITAL_COMMUNITY): Payer: Medicaid Other | Admitting: Anesthesiology

## 2012-05-29 ENCOUNTER — Encounter (HOSPITAL_COMMUNITY)
Admission: RE | Disposition: A | Discharge status: Home / Self Care | Payer: Self-pay | Source: Ambulatory Visit | Attending: Obstetrics & Gynecology

## 2012-05-29 ENCOUNTER — Encounter (HOSPITAL_COMMUNITY): Payer: Self-pay | Admitting: *Deleted

## 2012-05-29 ENCOUNTER — Ambulatory Visit (HOSPITAL_COMMUNITY)
Admission: RE | Admit: 2012-05-29 | Discharge: 2012-05-29 | Disposition: A | Discharge status: Home / Self Care | Payer: Medicaid Other | Source: Ambulatory Visit | Attending: Obstetrics & Gynecology | Admitting: Obstetrics & Gynecology

## 2012-05-29 DIAGNOSIS — N946 Dysmenorrhea, unspecified: Secondary | ICD-10-CM | POA: Insufficient documentation

## 2012-05-29 DIAGNOSIS — Z01812 Encounter for preprocedural laboratory examination: Secondary | ICD-10-CM | POA: Insufficient documentation

## 2012-05-29 DIAGNOSIS — Z6835 Body mass index (BMI) 35.0-35.9, adult: Secondary | ICD-10-CM | POA: Insufficient documentation

## 2012-05-29 DIAGNOSIS — I10 Essential (primary) hypertension: Secondary | ICD-10-CM | POA: Insufficient documentation

## 2012-05-29 DIAGNOSIS — Z79899 Other long term (current) drug therapy: Secondary | ICD-10-CM | POA: Insufficient documentation

## 2012-05-29 DIAGNOSIS — Z9889 Other specified postprocedural states: Secondary | ICD-10-CM

## 2012-05-29 DIAGNOSIS — G4733 Obstructive sleep apnea (adult) (pediatric): Secondary | ICD-10-CM | POA: Insufficient documentation

## 2012-05-29 DIAGNOSIS — E669 Obesity, unspecified: Secondary | ICD-10-CM | POA: Insufficient documentation

## 2012-05-29 DIAGNOSIS — E119 Type 2 diabetes mellitus without complications: Secondary | ICD-10-CM | POA: Insufficient documentation

## 2012-05-29 DIAGNOSIS — Z302 Encounter for sterilization: Secondary | ICD-10-CM | POA: Insufficient documentation

## 2012-05-29 DIAGNOSIS — N920 Excessive and frequent menstruation with regular cycle: Secondary | ICD-10-CM | POA: Insufficient documentation

## 2012-05-29 HISTORY — PX: LAPAROSCOPIC TUBAL LIGATION: SHX1937

## 2012-05-29 HISTORY — PX: HYSTEROSCOPY WITH THERMACHOICE: SHX5396

## 2012-05-29 LAB — GLUCOSE, CAPILLARY: Glucose-Capillary: 269 mg/dL — ABNORMAL HIGH (ref 70–99)

## 2012-05-29 SURGERY — LIGATION, FALLOPIAN TUBE, LAPAROSCOPIC
Anesthesia: General | Site: Uterus | Wound class: Clean

## 2012-05-29 MED ORDER — FENTANYL CITRATE 0.05 MG/ML IJ SOLN
INTRAMUSCULAR | Status: DC | PRN
  Administered 2012-05-29 (×4): 50 ug via INTRAVENOUS
  Administered 2012-05-29: 100 ug via INTRAVENOUS

## 2012-05-29 MED ORDER — CEFAZOLIN SODIUM 1-5 GM-% IV SOLN
INTRAVENOUS | Status: AC
  Filled 2012-05-29: qty 50

## 2012-05-29 MED ORDER — ONDANSETRON HCL 8 MG PO TABS: 8.0000 mg | ORAL_TABLET | Freq: Three times a day (TID) | ORAL | Status: AC | PRN

## 2012-05-29 MED ORDER — FENTANYL CITRATE 0.05 MG/ML IJ SOLN
25.0000 ug | INTRAMUSCULAR | Status: DC | PRN
  Administered 2012-05-29 (×2): 25 ug via INTRAVENOUS
  Administered 2012-05-29: 50 ug via INTRAVENOUS

## 2012-05-29 MED ORDER — ROCURONIUM BROMIDE 50 MG/5ML IV SOLN
INTRAVENOUS | Status: AC
  Filled 2012-05-29: qty 1

## 2012-05-29 MED ORDER — FENTANYL CITRATE 0.05 MG/ML IJ SOLN
INTRAMUSCULAR | Status: AC
  Administered 2012-05-29: 50 ug via INTRAVENOUS
  Filled 2012-05-29: qty 2

## 2012-05-29 MED ORDER — ONDANSETRON HCL 4 MG/2ML IJ SOLN
4.0000 mg | Freq: Once | INTRAMUSCULAR | Status: AC
  Administered 2012-05-29: 4 mg via INTRAVENOUS

## 2012-05-29 MED ORDER — CEFAZOLIN SODIUM 1-5 GM-% IV SOLN
INTRAVENOUS | Status: DC | PRN
  Administered 2012-05-29 (×2): 1 g via INTRAVENOUS

## 2012-05-29 MED ORDER — LACTATED RINGERS IV SOLN
INTRAVENOUS | Status: DC
  Administered 2012-05-29: 1000 mL via INTRAVENOUS
  Administered 2012-05-29: 09:00:00 via INTRAVENOUS

## 2012-05-29 MED ORDER — LIDOCAINE HCL (PF) 1 % IJ SOLN
INTRAMUSCULAR | Status: AC
  Filled 2012-05-29: qty 5

## 2012-05-29 MED ORDER — FENTANYL CITRATE 0.05 MG/ML IJ SOLN
INTRAMUSCULAR | Status: AC
  Administered 2012-05-29: 25 ug via INTRAVENOUS
  Filled 2012-05-29: qty 2

## 2012-05-29 MED ORDER — NEOSTIGMINE METHYLSULFATE 1 MG/ML IJ SOLN
INTRAMUSCULAR | Status: DC | PRN
  Administered 2012-05-29: 3 mg via INTRAVENOUS

## 2012-05-29 MED ORDER — GLYCOPYRROLATE 0.2 MG/ML IJ SOLN
0.2000 mg | Freq: Once | INTRAMUSCULAR | Status: AC
  Administered 2012-05-29: 0.2 mg via INTRAVENOUS

## 2012-05-29 MED ORDER — ROCURONIUM BROMIDE 100 MG/10ML IV SOLN
INTRAVENOUS | Status: DC | PRN
  Administered 2012-05-29: 40 mg via INTRAVENOUS
  Administered 2012-05-29 (×2): 10 mg via INTRAVENOUS

## 2012-05-29 MED ORDER — DEXTROSE 5 % IV SOLN
INTRAVENOUS | Status: DC | PRN
  Administered 2012-05-29: 500 mL via INTRAVENOUS

## 2012-05-29 MED ORDER — CEFAZOLIN SODIUM 1-5 GM-% IV SOLN: 1.0000 g | INTRAVENOUS | Status: DC

## 2012-05-29 MED ORDER — ONDANSETRON HCL 4 MG/2ML IJ SOLN
INTRAMUSCULAR | Status: AC
  Administered 2012-05-29: 4 mg via INTRAVENOUS
  Filled 2012-05-29: qty 2

## 2012-05-29 MED ORDER — CEFAZOLIN SODIUM 1 G IJ SOLR
INTRAMUSCULAR | Status: AC
  Filled 2012-05-29: qty 10

## 2012-05-29 MED ORDER — MIDAZOLAM HCL 5 MG/5ML IJ SOLN
INTRAMUSCULAR | Status: DC | PRN
  Administered 2012-05-29: 2 mg via INTRAVENOUS

## 2012-05-29 MED ORDER — ONDANSETRON HCL 4 MG/2ML IJ SOLN: 4.0000 mg | Freq: Once | INTRAMUSCULAR | Status: DC | PRN

## 2012-05-29 MED ORDER — LABETALOL HCL 5 MG/ML IV SOLN
INTRAVENOUS | Status: DC | PRN
  Administered 2012-05-29: 10 mg via INTRAVENOUS
  Administered 2012-05-29: 5 mg via INTRAVENOUS

## 2012-05-29 MED ORDER — PROPOFOL 10 MG/ML IV EMUL
INTRAVENOUS | Status: AC
  Filled 2012-05-29: qty 20

## 2012-05-29 MED ORDER — 0.9 % SODIUM CHLORIDE (POUR BTL) OPTIME
TOPICAL | Status: DC | PRN
  Administered 2012-05-29: 1000 mL

## 2012-05-29 MED ORDER — MIDAZOLAM HCL 2 MG/2ML IJ SOLN
1.0000 mg | INTRAMUSCULAR | Status: DC | PRN
  Administered 2012-05-29: 2 mg via INTRAVENOUS

## 2012-05-29 MED ORDER — KETOROLAC TROMETHAMINE 10 MG PO TABS: 10.0000 mg | ORAL_TABLET | Freq: Three times a day (TID) | ORAL | Status: AC | PRN

## 2012-05-29 MED ORDER — FENTANYL CITRATE 0.05 MG/ML IJ SOLN
INTRAMUSCULAR | Status: AC
  Filled 2012-05-29: qty 2

## 2012-05-29 MED ORDER — NEOSTIGMINE METHYLSULFATE 1 MG/ML IJ SOLN
INTRAMUSCULAR | Status: AC
  Filled 2012-05-29: qty 10

## 2012-05-29 MED ORDER — MIDAZOLAM HCL 2 MG/2ML IJ SOLN
INTRAMUSCULAR | Status: AC
  Administered 2012-05-29: 2 mg via INTRAVENOUS
  Filled 2012-05-29: qty 2

## 2012-05-29 MED ORDER — PROPOFOL 10 MG/ML IV BOLUS
INTRAVENOUS | Status: DC | PRN
  Administered 2012-05-29: 170 mg via INTRAVENOUS

## 2012-05-29 MED ORDER — GLYCOPYRROLATE 0.2 MG/ML IJ SOLN
INTRAMUSCULAR | Status: AC
  Administered 2012-05-29: 0.2 mg via INTRAVENOUS
  Filled 2012-05-29: qty 1

## 2012-05-29 MED ORDER — LABETALOL HCL 5 MG/ML IV SOLN
INTRAVENOUS | Status: AC
  Filled 2012-05-29: qty 4

## 2012-05-29 MED ORDER — FENTANYL CITRATE 0.05 MG/ML IJ SOLN
INTRAMUSCULAR | Status: AC
Start: 2012-05-29 — End: 2012-05-29
  Administered 2012-05-29: 25 ug via INTRAVENOUS
  Filled 2012-05-29: qty 2

## 2012-05-29 MED ORDER — ACETAMINOPHEN 325 MG PO TABS: 325.0000 mg | ORAL_TABLET | ORAL | Status: DC | PRN

## 2012-05-29 MED ORDER — MIDAZOLAM HCL 2 MG/2ML IJ SOLN
INTRAMUSCULAR | Status: AC
  Filled 2012-05-29: qty 2

## 2012-05-29 MED ORDER — SODIUM CHLORIDE 0.9 % IR SOLN
Status: DC | PRN
  Administered 2012-05-29: 3000 mL

## 2012-05-29 SURGICAL SUPPLY — 50 items
APPLICATOR COTTON TIP 6IN STRL (MISCELLANEOUS) ×2 IMPLANT
BAG DECANTER FOR FLEXI CONT (MISCELLANEOUS) ×4 IMPLANT
BAG HAMPER (MISCELLANEOUS) ×4 IMPLANT
BLADE SURG SZ11 CARB STEEL (BLADE) ×4 IMPLANT
CATH ROBINSON RED A/P 14FR (CATHETERS) ×2 IMPLANT
CATH THERMACHOICE III (CATHETERS) ×4 IMPLANT
CLOTH BEACON ORANGE TIMEOUT ST (SAFETY) ×4 IMPLANT
COVER LIGHT HANDLE STERIS (MISCELLANEOUS) ×8 IMPLANT
ELECT REM PT RETURN 9FT ADLT (ELECTROSURGICAL) ×4
ELECTRODE REM PT RTRN 9FT ADLT (ELECTROSURGICAL) ×3 IMPLANT
FORMALIN 10 PREFIL 120ML (MISCELLANEOUS) ×4 IMPLANT
GAUZE SPONGE 4X4 16PLY XRAY LF (GAUZE/BANDAGES/DRESSINGS) ×4 IMPLANT
GLOVE BIOGEL PI IND STRL 7.5 (GLOVE) ×1 IMPLANT
GLOVE BIOGEL PI IND STRL 8 (GLOVE) ×3 IMPLANT
GLOVE BIOGEL PI INDICATOR 7.5 (GLOVE) ×1
GLOVE BIOGEL PI INDICATOR 8 (GLOVE) ×1
GLOVE ECLIPSE 7.0 STRL STRAW (GLOVE) ×2 IMPLANT
GLOVE ECLIPSE 8.0 STRL XLNG CF (GLOVE) ×6 IMPLANT
GLOVE EXAM NITRILE MD LF STRL (GLOVE) ×4 IMPLANT
GOWN STRL REIN XL XLG (GOWN DISPOSABLE) ×8 IMPLANT
INST SET HYSTEROSCOPY (KITS) ×4 IMPLANT
INST SET LAPROSCOPIC GYN AP (KITS) ×4 IMPLANT
IV D5W 500ML (IV SOLUTION) ×4 IMPLANT
IV NS IRRIG 3000ML ARTHROMATIC (IV SOLUTION) ×4 IMPLANT
KIT ROOM TURNOVER AP CYSTO (KITS) ×4 IMPLANT
KIT ROOM TURNOVER APOR (KITS) ×4 IMPLANT
MANIFOLD NEPTUNE II (INSTRUMENTS) ×4 IMPLANT
MARKER SKIN DUAL TIP RULER LAB (MISCELLANEOUS) ×4 IMPLANT
NDL INSUFFLATION 14GA 120MM (NEEDLE) ×2 IMPLANT
NEEDLE INSUFFLATION 14GA 120MM (NEEDLE) ×4 IMPLANT
NS IRRIG 1000ML POUR BTL (IV SOLUTION) ×4 IMPLANT
PACK BASIC III (CUSTOM PROCEDURE TRAY) ×4
PACK PERI GYN (CUSTOM PROCEDURE TRAY) ×4 IMPLANT
PACK SRG BSC III STRL LF ECLPS (CUSTOM PROCEDURE TRAY) ×3 IMPLANT
PAD ARMBOARD 7.5X6 YLW CONV (MISCELLANEOUS) ×4 IMPLANT
PAD TELFA 3X4 1S STER (GAUZE/BANDAGES/DRESSINGS) ×4 IMPLANT
SET BASIN LINEN APH (SET/KITS/TRAYS/PACK) ×4 IMPLANT
SET IRRIG Y TYPE TUR BLADDER L (SET/KITS/TRAYS/PACK) ×4 IMPLANT
SHEET LAVH (DRAPES) ×4 IMPLANT
SOLUTION ANTI FOG 6CC (MISCELLANEOUS) ×4 IMPLANT
SPONGE GAUZE 2X2 8PLY STRL LF (GAUZE/BANDAGES/DRESSINGS) ×4 IMPLANT
STAPLER VISISTAT 35W (STAPLE) ×4 IMPLANT
SUT VICRYL 0 UR6 27IN ABS (SUTURE) ×4 IMPLANT
SYR 30ML LL (SYRINGE) ×4 IMPLANT
SYRINGE 10CC LL (SYRINGE) ×2 IMPLANT
TAPE CLOTH SURG 4X10 WHT LF (GAUZE/BANDAGES/DRESSINGS) ×2 IMPLANT
TROCAR Z-THRD FIOS HNDL 11X100 (TROCAR) ×4 IMPLANT
TUBING INSUFFLATION HIGH FLOW (TUBING) ×4 IMPLANT
WARMER LAPAROSCOPE (MISCELLANEOUS) ×4 IMPLANT
YANKAUER SUCT BULB TIP 10FT TU (MISCELLANEOUS) ×4 IMPLANT

## 2012-05-29 NOTE — Anesthesia Preprocedure Evaluation (Signed)
Anesthesia Evaluation  Patient identified by MRN, date of birth, ID band Patient awake    Reviewed: Allergy & Precautions, H&P , NPO status , Patient's Chart, lab work & pertinent test results, reviewed documented beta blocker date and time   Airway Mallampati: II TM Distance: >3 FB Neck ROM: Full    Dental No notable dental hx.    Pulmonary sleep apnea ,    Pulmonary exam normal       Cardiovascular hypertension, Pt. on medications and Pt. on home beta blockers Rhythm:Regular Rate:Normal     Neuro/Psych PSYCHIATRIC DISORDERS Anxiety Depression    GI/Hepatic GERD-  Medicated and Controlled,  Endo/Other  Diabetes mellitus-, Poorly Controlled, Type 2, Oral Hypoglycemic Agents  Renal/GU      Musculoskeletal negative musculoskeletal ROS (+)   Abdominal (+) + obese,  Abdomen: soft.    Peds  Hematology negative hematology ROS (+)   Anesthesia Other Findings   Reproductive/Obstetrics negative OB ROS                           Anesthesia Physical Anesthesia Plan  ASA: III  Anesthesia Plan: General   Post-op Pain Management:    Induction: Intravenous  Airway Management Planned: Oral ETT  Additional Equipment:   Intra-op Plan:   Post-operative Plan: Extubation in OR  Informed Consent: I have reviewed the patients History and Physical, chart, labs and discussed the procedure including the risks, benefits and alternatives for the proposed anesthesia with the patient or authorized representative who has indicated his/her understanding and acceptance.   Dental advisory given  Plan Discussed with: CRNA  Anesthesia Plan Comments:         Anesthesia Quick Evaluation
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
## 2012-05-29 NOTE — Op Note (Signed)
Preoperative Diagnosis:  Multiparous female desires permanent sterilization                                         Menometrorrhagia                                         Dysmenorrhea  Postoperative Diagnosis:  Same as above  Procedure:  Laparoscopic Bilateral Tubal Ligation                     Hysteroscopy endometrial ablation  Surgeon:  Rockne Coons MD  Anaesthesia:  LMA  Findings:  Patient had normal pelvic anatomy and no intraperitoneal abnormalities.  Endometrial cavity was normal  Description of Operation:  Patient was taken to the OR and placed into supine position where she underwent LMA anaesthesia.  She was placed in the dorsal lithotomy position and prepped and draped in the usual sterile fashion.  An incision was made in the umbilicus and dissection taken down to the rectus fascia which was incised and opened.  The non bladed trocar was then placed and the peritoneal cavity was insufflated.  The above noted findings were observed.  The Kleppinger electrocautery unit was employed and both tubes were burned to no resistance and beyond in the distal ishtmic and ampullary regions, approximately a 3.5 cm segment bilaterally.  There was good hemostasis.  The fascia, peritoneum and subcutaneous tissue were closed using 0 vicryl.  The skin was closed using staples.   I then turned my attention to the endometrial ablation.  The cervix was grasped and dilated to allow the hysteroscope to pass.  The endometrium was normal.  The thermachoice III balloon system was used and 16 cc of D5W required to maintain a pressure of 190 mm HG throughout.  The fluid was heated to 87 degrees Celsius throughout.  Total therapy time was 8:59.      The patient was awakened from anaesthesia and taken to the PACU with all counts being correct x 3.  The patient received Ancef 2 gram and Toradol 30 mg IV preoperatively.  Monesha Monreal H 05/29/2012 9:29 AM

## 2012-05-29 NOTE — Anesthesia Procedure Notes (Signed)
Procedure Name: Intubation Date/Time: 05/29/2012 8:18 AM Performed by: Despina Hidden Pre-anesthesia Checklist: Emergency Drugs available, Suction available, Patient identified and Patient being monitored Patient Re-evaluated:Patient Re-evaluated prior to inductionOxygen Delivery Method: Circle system utilized Preoxygenation: Pre-oxygenation with 100% oxygen Intubation Type: IV induction Ventilation: Mask ventilation without difficulty Laryngoscope Size: Mac and 3 Grade View: Grade II Tube size: 7.0 mm Number of attempts: 1 Airway Equipment and Method: Stylet Placement Confirmation: positive ETCO2,  breath sounds checked- equal and bilateral and ETT inserted through vocal cords under direct vision Dental Injury: Teeth and Oropharynx as per pre-operative assessment

## 2012-05-29 NOTE — Discharge Instructions (Signed)
Endometrial Ablation Endometrial ablation removes the lining of the uterus (endometrium). It is usually a same day, outpatient treatment. Ablation helps avoid major surgery (such as a hysterectomy). A hysterectomy is removal of the cervix and uterus. Endometrial ablation has less risk and complications, has a shorter recovery period and is less expensive. After endometrial ablation, most women will have little or no menstrual bleeding. You may not keep your fertility. Pregnancy is no longer likely after this procedure but if you are pre-menopausal, you still need to use a reliable method of birth control following the procedure because pregnancy can occur. REASONS TO HAVE THE PROCEDURE MAY INCLUDE:  Heavy periods.   Bleeding that is causing anemia.   Anovulatory bleeding, very irregular, bleeding.   Bleeding submucous fibroids (on the lining inside the uterus) if they are smaller than 3 centimeters.  REASONS NOT TO HAVE THE PROCEDURE MAY INCLUDE:  You wish to have more children.   You have a pre-cancerous or cancerous problem. The cause of any abnormal bleeding must be diagnosed before having the procedure.   You have pain coming from the uterus.   You have a submucus fibroid larger than 3 centimeters.   You recently had a baby.   You recently had an infection in the uterus.   You have a severe retro-flexed, tipped uterus and cannot insert the instrument to do the ablation.   You had a Cesarean section or deep major surgery on the uterus.   The inner cavity of the uterus is too large for the endometrial ablation instrument.  RISKS AND COMPLICATIONS   Perforation of the uterus.   Bleeding.   Infection of the uterus, bladder or vagina.   Injury to surrounding organs.   Cutting the cervix.   An air bubble to the lung (air embolus).   Pregnancy following the procedure.   Failure of the procedure to help the problem requiring hysterectomy.   Decreased ability to diagnose  cancer in the lining of the uterus.  BEFORE THE PROCEDURE  The lining of the uterus must be tested to make sure there is no pre-cancerous or cancer cells present.   Medications may be given to make the lining of the uterus thinner.   Ultrasound may be used to evaluate the size and look for abnormalities of the uterus.   Future pregnancy is not desired.  PROCEDURE  There are different ways to destroy the lining of the uterus.   Resectoscope - radio frequency-alternating electric current is the most common one used.   Cryotherapy - freezing the lining of the uterus.   Heated Free Liquid - heated salt (saline) solution inserted into the uterus.   Microwave - uses high energy microwaves in the uterus.   Thermal Balloon - a catheter with a balloon tip is inserted into the uterus and filled with heated fluid.  Your caregiver will talk with you about the method used in this clinic. They will also instruct you on the pros and cons of the procedure. Endometrial ablation is performed along with a procedure called operative hysteroscopy. A narrow viewing tube is inserted through the birth canal (vagina) and through the cervix into the uterus. A tiny camera attached to the viewing tube (hysteroscope) allows the uterine cavity to be shown on a TV monitor during surgery. Your uterus is filled with a harmless liquid to make the procedure easier. The lining of the uterus is then removed. The lining can also be removed with a resectoscope which allows your surgeon   to cut away the lining of the uterus under direct vision. Usually, you will be able to go home within an hour after the procedure. HOME CARE INSTRUCTIONS   Do not drive for 24 hours.   No tampons, douching or intercourse for 2 weeks or until your caregiver approves.   Rest at home for 24 to 48 hours. You may then resume normal activities unless told differently by your caregiver.   Take your temperature two times a day for 4 days, and record  it.   Take any medications your caregiver has ordered, as directed.   Use some form of contraception if you are pre-menopausal and do not want to get pregnant.  Bleeding after the procedure is normal. It varies from light spotting and mildly watery to bloody discharge for 4 to 6 weeks. You may also have mild cramping. Only take over-the-counter or prescription medicines for pain, discomfort, or fever as directed by your caregiver. Do not use aspirin, as this may aggravate bleeding. Frequent urination during the first 24 hours is normal. You will not know how effective your surgery is until at least 3 months after the surgery. SEEK IMMEDIATE MEDICAL CARE IF:   Bleeding is heavier than a normal menstrual cycle.   An oral temperature above 102 F (38.9 C) develops.   You have increasing cramps or pains not relieved with medication or develop belly (abdominal) pain which does not seem to be related to the same area of earlier cramping and pain.   You are light headed, weak or have fainting episodes.   You develop pain in the shoulder strap areas.   You have chest or leg pain.   You have abnormal vaginal discharge.   You have painful urination.  Document Released: 09/27/2004 Document Revised: 11/07/2011 Document Reviewed: 12/26/2007 [facility] Patient Information 2012 [place], [place].Laparoscopic Tubal Ligation Care After Laparoscopic tubal ligation is an operation done with a long, lighted tube inserted through a small cut (incision) in the abdomen. The fallopian tubes are blocked by tying, clamping with a plastic clamp, or burning them closed with an electrocautery. Read the instructions outlined below and refer to this sheet in the next few weeks. These instructions provide you with general information on caring for yourself after you leave the hospital. Your caregiver may also give you specific instructions. While your treatment has been planned according to the most current medical  practices available, unavoidable complications may occur. If you have any problems or questions after discharge, please call your caregiver. HOME CARE INSTRUCTIONS   It will be normal to be sore for a couple days following surgery.   Take your medicine and follow the instructions from your caregiver.   You may resume usual diet, exercise, driving and activities as allowed by your caregiver.   Do not have sexual intercourse until your caregiver gives you permission.   Do not drive while taking pain medicine.   Avoid lifting until you are instructed otherwise.   Use showers for bathing, until you are seen by your caregiver.   Change dressings if needed, and as directed.   Only take over-the-counter or prescription medicines for pain, discomfort or fever as directed by your caregiver.   Do not take aspirin because it can cause bleeding.   Take your temperature twice a day and record it.   Have someone stay with you the day you have the operation, and for a couple days afterward.   Make an appointment to see your caregiver for stitches (sutures)  or staple removal and postoperative exams, as instructed.  SEEK MEDICAL CARE IF:   There is redness, swelling, or increasing pain in a wound.   There is drainage from a wound lasting longer than one day.   Your pain is getting worse.   You develop a rash.   You are having a reaction to your medicine.   You become dizzy or lightheaded.   You need stronger medicine or a change in your pain medicine.   You notice a foul smell coming from a wound or dressing.   There is a breaking open of a wound after the stitches, staples, or skin adhesive strips have been removed.   You develop constipation.  SEEK IMMEDIATE MEDICAL CARE IF:   You have an oral temperature above 102 F (38.9 C), not controlled by medicine.   There is increasing abdominal pain.   You develop pain in your shoulders (shoulder strap areas) which becomes more  severe. Some pain is common, because of the gas inserted into your abdomen during the procedure.   You develop bleeding or drainage from the suture sites or vagina (birth canal) following surgery.   You pass out.   You develop shortness of breath or difficulty breathing.   You develop chest or leg pain.   You develop persistent nausea, vomiting or diarrhea.  MAKE SURE YOU:   Understand these instructions.   Watch your condition.   Get help right away if you are not doing well or get worse.  Document Released: 06/07/2005 Document Revised: 11/07/2011 Document Reviewed: 12/04/2009 [facility] Patient Information 2012 [place], [place].

## 2012-05-29 NOTE — Transfer of Care (Signed)
Immediate Anesthesia Transfer of Care Note  Patient: Yolanda [name]  Procedure(s) Performed: Procedure(s) (LRB): LAPAROSCOPIC TUBAL LIGATION (Bilateral) HYSTEROSCOPY WITH THERMACHOICE (N/A)  Patient Location: PACU  Anesthesia Type: General  Level of Consciousness: awake and patient cooperative  Airway & Oxygen Therapy: Patient Spontanous Breathing and Patient connected to face mask oxygen  Post-op Assessment: Report given to PACU RN, Post -op Vital signs reviewed and stable and Patient moving all extremities  Post vital signs: Reviewed and stable  Complications: No apparent anesthesia complications

## 2012-05-29 NOTE — Anesthesia Postprocedure Evaluation (Signed)
  Anesthesia Post-op Note  Patient: Yolanda [name]  Procedure(s) Performed: Procedure(s) (LRB): LAPAROSCOPIC TUBAL LIGATION (Bilateral) HYSTEROSCOPY WITH THERMACHOICE (N/A)  Patient Location: PACU  Anesthesia Type: General  Level of Consciousness: awake, alert , oriented and patient cooperative  Airway and Oxygen Therapy: Patient Spontanous Breathing  Post-op Pain: 3 /10, mild  Post-op Assessment: Post-op Vital signs reviewed, Patient's Cardiovascular Status Stable, Respiratory Function Stable, Patent Airway, No signs of Nausea or vomiting and Pain level controlled  Post-op Vital Signs: Reviewed and stable  Complications: No apparent anesthesia complications

## 2012-06-01 ENCOUNTER — Encounter (HOSPITAL_COMMUNITY): Payer: Self-pay | Admitting: Obstetrics & Gynecology

## 2012-10-13 ENCOUNTER — Other Ambulatory Visit: Payer: Self-pay | Admitting: Obstetrics & Gynecology

## 2012-10-13 ENCOUNTER — Other Ambulatory Visit (HOSPITAL_COMMUNITY)
Admission: RE | Admit: 2012-10-13 | Discharge: 2012-10-13 | Disposition: A | Discharge status: Home / Self Care | Payer: Medicaid Other | Source: Ambulatory Visit | Attending: Obstetrics & Gynecology | Admitting: Obstetrics & Gynecology

## 2012-10-13 DIAGNOSIS — Z01419 Encounter for gynecological examination (general) (routine) without abnormal findings: Secondary | ICD-10-CM | POA: Insufficient documentation

## 2012-11-06 DIAGNOSIS — G629 Polyneuropathy, unspecified: Secondary | ICD-10-CM | POA: Insufficient documentation

## 2013-04-29 ENCOUNTER — Other Ambulatory Visit: Payer: Self-pay | Admitting: Obstetrics & Gynecology

## 2013-05-05 ENCOUNTER — Encounter: Payer: Self-pay | Admitting: *Deleted

## 2013-05-06 ENCOUNTER — Encounter: Payer: Self-pay | Admitting: Adult Health

## 2013-05-06 ENCOUNTER — Ambulatory Visit (INDEPENDENT_AMBULATORY_CARE_PROVIDER_SITE_OTHER): Payer: Medicaid Other | Admitting: Adult Health

## 2013-05-06 VITALS — BP 132/94 | Ht 66.5 in | Wt 243.0 lb

## 2013-05-06 DIAGNOSIS — F603 Borderline personality disorder: Secondary | ICD-10-CM

## 2013-05-06 DIAGNOSIS — N949 Unspecified condition associated with female genital organs and menstrual cycle: Secondary | ICD-10-CM

## 2013-05-06 DIAGNOSIS — B9689 Other specified bacterial agents as the cause of diseases classified elsewhere: Secondary | ICD-10-CM

## 2013-05-06 DIAGNOSIS — G4730 Sleep apnea, unspecified: Secondary | ICD-10-CM

## 2013-05-06 DIAGNOSIS — N760 Acute vaginitis: Secondary | ICD-10-CM

## 2013-05-06 DIAGNOSIS — E785 Hyperlipidemia, unspecified: Secondary | ICD-10-CM | POA: Insufficient documentation

## 2013-05-06 DIAGNOSIS — F410 Panic disorder [episodic paroxysmal anxiety] without agoraphobia: Secondary | ICD-10-CM | POA: Insufficient documentation

## 2013-05-06 DIAGNOSIS — E119 Type 2 diabetes mellitus without complications: Secondary | ICD-10-CM

## 2013-05-06 DIAGNOSIS — Z113 Encounter for screening for infections with a predominantly sexual mode of transmission: Secondary | ICD-10-CM

## 2013-05-06 DIAGNOSIS — E1159 Type 2 diabetes mellitus with other circulatory complications: Secondary | ICD-10-CM | POA: Insufficient documentation

## 2013-05-06 DIAGNOSIS — A499 Bacterial infection, unspecified: Secondary | ICD-10-CM

## 2013-05-06 DIAGNOSIS — F319 Bipolar disorder, unspecified: Secondary | ICD-10-CM | POA: Insufficient documentation

## 2013-05-06 DIAGNOSIS — I10 Essential (primary) hypertension: Secondary | ICD-10-CM

## 2013-05-06 LAB — POCT WET PREP (WET MOUNT)

## 2013-05-06 MED ORDER — METRONIDAZOLE 500 MG PO TABS: 500.0000 mg | ORAL_TABLET | Freq: Two times a day (BID) | ORAL | Status: DC

## 2013-05-06 NOTE — Patient Instructions (Addendum)
Bacterial Vaginosis Bacterial vaginosis (BV) is a vaginal infection where the normal balance of bacteria in the vagina is disrupted. The normal balance is then replaced by an overgrowth of certain bacteria. There are several different kinds of bacteria that can cause BV. BV is the most common vaginal infection in women of childbearing age. CAUSES   The cause of BV is not fully understood. BV develops when there is an increase or imbalance of harmful bacteria.  Some activities or behaviors can upset the normal balance of bacteria in the vagina and put women at increased risk including:  Having a new sex partner or multiple sex partners.  Douching.  Using an intrauterine device (IUD) for contraception.  It is not clear what role sexual activity plays in the development of BV. However, women that have never had sexual intercourse are rarely infected with BV. Women do not get BV from toilet seats, bedding, swimming pools or from touching objects around them.  SYMPTOMS   Grey vaginal discharge.  A fish-like odor with discharge, especially after sexual intercourse.  Itching or burning of the vagina and vulva.  Burning or pain with urination.  Some women have no signs or symptoms at all. DIAGNOSIS  Your caregiver must examine the vagina for signs of BV. Your caregiver will perform lab tests and look at the sample of vaginal fluid through a microscope. They will look for bacteria and abnormal cells (clue cells), a pH test higher than 4.5, and a positive amine test all associated with BV.  RISKS AND COMPLICATIONS   Pelvic inflammatory disease (PID).  Infections following gynecology surgery.  Developing HIV.  Developing herpes virus. TREATMENT  Sometimes BV will clear up without treatment. However, all women with symptoms of BV should be treated to avoid complications, especially if gynecology surgery is planned. Female partners generally do not need to be treated. However, BV may spread  between female sex partners so treatment is helpful in preventing a recurrence of BV.   BV may be treated with antibiotics. The antibiotics come in either pill or vaginal cream forms. Either can be used with nonpregnant or pregnant women, but the recommended dosages differ. These antibiotics are not harmful to the baby.  BV can recur after treatment. If this happens, a second round of antibiotics will often be prescribed.  Treatment is important for pregnant women. If not treated, BV can cause a premature delivery, especially for a pregnant woman who had a premature birth in the past. All pregnant women who have symptoms of BV should be checked and treated.  For chronic reoccurrence of BV, treatment with a type of prescribed gel vaginally twice a week is helpful. HOME CARE INSTRUCTIONS   Finish all medication as directed by your caregiver.  Do not have sex until treatment is completed.  Tell your sexual partner that you have a vaginal infection. They should see their caregiver and be treated if they have problems, such as a mild rash or itching.  Practice safe sex. Use condoms. Only have 1 sex partner. PREVENTION  Basic prevention steps can help reduce the risk of upsetting the natural balance of bacteria in the vagina and developing BV:  Do not have sexual intercourse (be abstinent).  Do not douche.  Use all of the medicine prescribed for treatment of BV, even if the signs and symptoms go away.  Tell your sex partner if you have BV. That way, they can be treated, if needed, to prevent reoccurrence. SEEK MEDICAL CARE IF:     Your symptoms are not improving after 3 days of treatment.  You have increased discharge, pain, or fever. MAKE SURE YOU:   Understand these instructions.  Will watch your condition.  Will get help right away if you are not doing well or get worse. FOR MORE INFORMATION  Division of STD Prevention (DSTDP), Centers for Disease Control and Prevention:  [employer] American Social Health Association (ASHA): www.ashastd.org  Document Released: 11/18/2005 Document Revised: 02/10/2012 Document Reviewed: 05/11/2009 [facility] Patient Information 2014 [place], [place]. Take flagyl no sex no alcohol Pelvic US in 2 weeks
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
## 2013-05-07 LAB — RPR

## 2013-05-07 LAB — GC/CHLAMYDIA PROBE AMP
CT Probe RNA: NEGATIVE
GC Probe RNA: NEGATIVE

## 2013-05-07 LAB — HSV 2 ANTIBODY, IGG: HSV 2 Glycoprotein G Ab, IgG: 0.1 IV

## 2013-05-07 LAB — HIV ANTIBODY (ROUTINE TESTING W REFLEX): HIV: NONREACTIVE

## 2013-05-10 ENCOUNTER — Telehealth: Payer: Self-pay | Admitting: Adult Health

## 2013-05-10 NOTE — Telephone Encounter (Signed)
Left message labs negative 

## 2013-05-13 ENCOUNTER — Other Ambulatory Visit: Payer: Self-pay

## 2013-05-13 DIAGNOSIS — G4730 Sleep apnea, unspecified: Secondary | ICD-10-CM

## 2013-05-20 ENCOUNTER — Other Ambulatory Visit (INDEPENDENT_AMBULATORY_CARE_PROVIDER_SITE_OTHER): Payer: Medicaid Other

## 2013-05-20 ENCOUNTER — Ambulatory Visit: Payer: Medicaid Other | Admitting: Adult Health

## 2013-05-21 ENCOUNTER — Other Ambulatory Visit: Payer: Self-pay | Admitting: Obstetrics & Gynecology

## 2013-05-21 DIAGNOSIS — N949 Unspecified condition associated with female genital organs and menstrual cycle: Secondary | ICD-10-CM

## 2013-05-25 ENCOUNTER — Encounter: Payer: Self-pay | Admitting: Adult Health

## 2013-05-25 ENCOUNTER — Ambulatory Visit (INDEPENDENT_AMBULATORY_CARE_PROVIDER_SITE_OTHER): Payer: Medicaid Other | Admitting: Adult Health

## 2013-05-25 ENCOUNTER — Ambulatory Visit (INDEPENDENT_AMBULATORY_CARE_PROVIDER_SITE_OTHER): Payer: Medicaid Other

## 2013-05-25 VITALS — BP 130/90 | Ht 66.0 in | Wt 242.0 lb

## 2013-05-25 DIAGNOSIS — N949 Unspecified condition associated with female genital organs and menstrual cycle: Secondary | ICD-10-CM

## 2013-05-25 DIAGNOSIS — N840 Polyp of corpus uteri: Secondary | ICD-10-CM

## 2013-05-25 MED ORDER — FLUCONAZOLE 150 MG PO TABS: ORAL_TABLET | ORAL | Status: DC

## 2013-05-25 NOTE — Patient Instructions (Addendum)
Follow up in 1 week to see Dr Despina Hidden

## 2013-05-25 NOTE — Progress Notes (Signed)
Subjective:     Patient ID: Yolanda [name], female   DOB: May 05, 1971, 42 y.o.   MRN: [id number]  HPI Yolanda [name] is back for [place] and complains of itching after antibiotics and she is still complaining of pelvic pain  Review of Systems Positives in HPI Reviewed past medical,surgical, social and family history. Reviewed medications and allergies.     Objective:   Physical Exam BP 130/90  Ht 5\' 6"  (1.676 m)  Wt 242 lb (109.77 kg)  BMI 39.08 kg/m2  LMP 05/24/2013 Reviewed [place] with pt she has a 1.4 cm mass ?poylp in endometrium.    Assessment:      Pelvic Pain ? Endometrial poylp   vaginal itch Plan:     Return to see Dr Yolanda [name] in 1 week   Rx diflucan 150 mg  #2 1 now and 1 in 3 days if needed with 1 refill

## 2013-06-07 ENCOUNTER — Ambulatory Visit: Payer: Medicaid Other | Admitting: Obstetrics & Gynecology

## 2013-06-14 ENCOUNTER — Encounter: Payer: Medicaid Other | Admitting: Obstetrics & Gynecology

## 2013-06-15 ENCOUNTER — Encounter: Payer: Self-pay | Admitting: Obstetrics & Gynecology

## 2013-06-15 NOTE — Progress Notes (Signed)
This encounter was created in error - please disregard.

## 2013-06-22 ENCOUNTER — Ambulatory Visit: Payer: Medicaid Other | Attending: Neurology | Admitting: Sleep Medicine

## 2013-06-22 DIAGNOSIS — G4733 Obstructive sleep apnea (adult) (pediatric): Secondary | ICD-10-CM | POA: Insufficient documentation

## 2013-06-22 DIAGNOSIS — G4730 Sleep apnea, unspecified: Secondary | ICD-10-CM

## 2013-06-26 NOTE — Procedures (Signed)
[facility] Liese Dizdarevic A. Gerilyn Pilgrim, MD     [email or web address]        Yolanda [name], Yolanda [name]                ACCOUNT NO.:  [id number]  MEDICAL RECORD NO.:  [id number]          PATIENT TYPE:  OUT  LOCATION:  SLEEP LAB                     FACILITY:  [facility]  PHYSICIAN:  Reigna Ruperto A. Gerilyn Pilgrim, M.D. DATE OF BIRTH:  [birth year]  DATE OF STUDY:  06/22/2013                           NOCTURNAL POLYSOMNOGRAM  REFERRING PHYSICIAN:  Jordayn Mink A. Gerilyn Pilgrim, M.D.  INDICATION FOR STUDY:  A 42 year old who had a previous study documenting significant obstructive sleep apnea syndrome.  This is a CPAP titration recording.  EPWORTH SLEEPINESS SCORE:  23.  BMI 39.  MEDICATIONS:  Atenolol, cetirizine, diazepam, fentanyl, gabapentin, metformin, miconazole, Protonix, Paxil, Ultram, VESIcare, doxycycline, loratadine, glipizide, and metronidazole.  SLEEP ARCHITECTURE:  This is a CPAP titration recording.  Total recording time is 363 minutes, sleep efficiency 90%, sleep latency is 7.5 minutes.  REM latency 103 minutes.  Stage N1 3.5%, N2 68%, N3 3%, and REM sleep 45%.  RESPIRATORY DATA:  Baseline oxygen saturation is 95, lowest saturation 86 during non-REM sleep.  The patient was placed on positive pressure starting at 4 and increased to 11.  The optimal pressure is 11 with resolution of events and good tolerance.  CARDIAC DATA:  Average heart rate is 77 with no significant dysrhythmias observed.  MOVEMENT-PARASOMNIA:  PLM index is 0.7.  IMPRESSIONS-RECOMMENDATIONS:  Obstructive sleep apnea syndrome, which responds well to a CPAP of 11.     Jaylenne Hamelin A. Gerilyn Pilgrim, M.D.   KAD/MEDQ  D:  06/26/2013 12:58:00  T:  06/26/2013 13:08:40  Job:  [id number]

## 2013-09-24 ENCOUNTER — Other Ambulatory Visit: Payer: Self-pay | Admitting: Adult Health

## 2013-09-24 DIAGNOSIS — N644 Mastodynia: Secondary | ICD-10-CM

## 2013-10-13 ENCOUNTER — Ambulatory Visit (HOSPITAL_COMMUNITY)
Admission: RE | Admit: 2013-10-13 | Discharge: 2013-10-13 | Disposition: A | Discharge status: Home / Self Care | Payer: Medicare Other | Source: Ambulatory Visit | Attending: Neurology | Admitting: Neurology

## 2013-10-13 ENCOUNTER — Other Ambulatory Visit: Payer: Self-pay | Admitting: Neurology

## 2013-10-13 ENCOUNTER — Other Ambulatory Visit (HOSPITAL_COMMUNITY): Payer: Self-pay | Admitting: Nurse Practitioner

## 2013-10-13 ENCOUNTER — Inpatient Hospital Stay (HOSPITAL_COMMUNITY): Admission: RE | Admit: 2013-10-13 | Payer: Medicaid Other | Source: Ambulatory Visit

## 2013-10-13 DIAGNOSIS — M545 Low back pain, unspecified: Secondary | ICD-10-CM

## 2013-10-13 DIAGNOSIS — M51379 Other intervertebral disc degeneration, lumbosacral region without mention of lumbar back pain or lower extremity pain: Secondary | ICD-10-CM | POA: Insufficient documentation

## 2013-10-13 DIAGNOSIS — M439 Deforming dorsopathy, unspecified: Secondary | ICD-10-CM | POA: Insufficient documentation

## 2013-10-13 DIAGNOSIS — M5137 Other intervertebral disc degeneration, lumbosacral region: Secondary | ICD-10-CM | POA: Insufficient documentation

## 2013-11-03 ENCOUNTER — Encounter (HOSPITAL_COMMUNITY): Payer: Medicaid Other

## 2013-11-17 ENCOUNTER — Encounter (HOSPITAL_COMMUNITY): Payer: Medicaid Other

## 2013-12-15 ENCOUNTER — Encounter (HOSPITAL_COMMUNITY): Payer: Medicaid Other

## 2013-12-16 ENCOUNTER — Encounter: Payer: Self-pay | Admitting: Family Medicine

## 2013-12-16 ENCOUNTER — Ambulatory Visit (INDEPENDENT_AMBULATORY_CARE_PROVIDER_SITE_OTHER): Payer: Medicare Other | Admitting: Family Medicine

## 2013-12-16 VITALS — BP 112/82 | HR 99 | Temp 98.4°F | Resp 20 | Ht 66.0 in | Wt 225.1 lb

## 2013-12-16 DIAGNOSIS — G894 Chronic pain syndrome: Secondary | ICD-10-CM

## 2013-12-16 DIAGNOSIS — R209 Unspecified disturbances of skin sensation: Secondary | ICD-10-CM

## 2013-12-16 DIAGNOSIS — E1165 Type 2 diabetes mellitus with hyperglycemia: Secondary | ICD-10-CM | POA: Insufficient documentation

## 2013-12-16 DIAGNOSIS — K219 Gastro-esophageal reflux disease without esophagitis: Secondary | ICD-10-CM

## 2013-12-16 DIAGNOSIS — I10 Essential (primary) hypertension: Secondary | ICD-10-CM

## 2013-12-16 DIAGNOSIS — R5383 Other fatigue: Secondary | ICD-10-CM

## 2013-12-16 DIAGNOSIS — F603 Borderline personality disorder: Secondary | ICD-10-CM

## 2013-12-16 DIAGNOSIS — E785 Hyperlipidemia, unspecified: Secondary | ICD-10-CM

## 2013-12-16 DIAGNOSIS — R5381 Other malaise: Secondary | ICD-10-CM

## 2013-12-16 DIAGNOSIS — IMO0002 Reserved for concepts with insufficient information to code with codable children: Secondary | ICD-10-CM | POA: Insufficient documentation

## 2013-12-16 DIAGNOSIS — E1159 Type 2 diabetes mellitus with other circulatory complications: Secondary | ICD-10-CM

## 2013-12-16 DIAGNOSIS — J411 Mucopurulent chronic bronchitis: Secondary | ICD-10-CM

## 2013-12-16 DIAGNOSIS — E1151 Type 2 diabetes mellitus with diabetic peripheral angiopathy without gangrene: Secondary | ICD-10-CM

## 2013-12-16 DIAGNOSIS — R202 Paresthesia of skin: Secondary | ICD-10-CM

## 2013-12-16 DIAGNOSIS — E119 Type 2 diabetes mellitus without complications: Secondary | ICD-10-CM | POA: Insufficient documentation

## 2013-12-16 DIAGNOSIS — G4733 Obstructive sleep apnea (adult) (pediatric): Secondary | ICD-10-CM | POA: Insufficient documentation

## 2013-12-16 DIAGNOSIS — Z9989 Dependence on other enabling machines and devices: Secondary | ICD-10-CM

## 2013-12-16 NOTE — Patient Instructions (Signed)
Vitamin B12 and Folate Test This is a test used to help diagnose the cause of anemia or neuropathy (nerve damage), to evaluate nutritional status in some patients, or to monitor effectiveness of treatment for B12 or folate deficiency. These tests measure the concentration of folate and vitamin B12 in the serum (liquid portion of the blood). The amount of folate inside the red blood cell (RBC) may also be measured ; it will normally be at a higher concentration inside the cell than in the serum.  B12 and folate are both part of the B complex of vitamins and come from food. Folate is found in leafy green vegetables, citrus fruits, dry beans and peas, liver, and yeast; while B12 is found in animal products such as red meat, fish, poultry, milk, and eggs. Fortified cereals, breads, and other grain products are now also important dietary sources of both B12 and folate (identified as "folic acid" on nutritional labels), especially for those vegetarians who do not consume any animal products.  Both B12 and folate are necessary for normal RBC formation, tissue and cellular repair, and DNA synthesis. B12 is also important for nerve health, while folate is necessary for cell division such as is seen in a fetus during pregnancy. A deficiency in either B12 or folate can lead to a form of anemia characterized by the production of fewer, but larger, RBC's (called macrocytes). A deficiency in B12 can also result in varying degrees of neuropathy, nerve damage that can cause tingling and numbness in the patient's hands and feet. A deficiency in folate can cause neural tube defects such as spina bifida in a growing fetus.  PREPARATION FOR TEST No fasting is necessary. A blood sample is obtained by inserting a needle into a vein in the arm. NORMAL FINDINGS 160-950 pg/mL or 118-701 pmol/L (SI units) Ranges for normal findings may vary among different laboratories and hospitals. You should always check with your doctor after  having lab work or other tests done to discuss the meaning of your test results and whether your values are considered within normal limits. MEANING OF TEST  Your caregiver will go over the test results with you and discuss the importance and meaning of your results, as well as treatment options and the need for additional tests if necessary. Reference values are dependent on many factors, including patient age, gender, sample population, and testing method. Numeric test results have different meanings in different labs. Your lab report should include the specific reference range for your test.  If there is a decreased concentration of B12 and/or folate, then it is likely there is some degree of deficiency. The test results will indicate the presence of the deficiency, but they do not necessarily reflect the severity of the anemia or neuropathy associated with the deficiency or its underlying cause. There are a variety of causes of B12 and/or folate deficiencies. Most of these are from poor intake, not absorbing the vitamins or losing more vitamins than are taken in. OBTAINING THE TEST RESULTS It is your responsibility to obtain your test results. Ask the lab or department performing the test when and how you will get your results. Document Released: 12/13/2004 Document Revised: 02/10/2012 Document Reviewed: 11/20/2008 [facility] Patient Information 2014 [place], [place].  Diets for Diabetes, Food Labeling Look at food labels to help you decide how much of a product you can eat. You will want to check the amount of total carbohydrate in a serving to see how the food fits into your meal  plan. In the list of ingredients, the ingredient present in the largest amount by weight must be listed first, followed by the other ingredients in descending order. STANDARD OF IDENTITY Most products have a list of ingredients. However, foods that the Food and Drug Administration (FDA) has given a standard of identity do  not need a list of ingredients. A standard of identity means that a food must contain certain ingredients if it is called a particular name. Examples are mayonnaise, peanut butter, ketchup, jelly, and cheese. LABELING TERMS There are many terms found on food labels. Some of these terms have specific definitions. Some terms are regulated by the FDA, and the FDA has clearly specified how they can be used. Others are not regulated or well-defined and can be misleading and confusing. SPECIFICALLY DEFINED TERMS Nutritive Sweetener.  A sweetener that contains calories,such as table sugar or honey. Nonnutritive Sweetener.  A sweetener with few or no calories,such as saccharin, aspartame, sucralose, and cyclamate. LABELING TERMS REGULATED BY THE FDA Free.  The product contains only a tiny or small amount of fat, cholesterol, sodium, sugar, or calories. For example, a "fat-free" product will contain less than 0.5 g of fat per serving. Low.  A food described as "low" in fat, saturated fat, cholesterol, sodium, or calories could be eaten fairly often without exceeding dietary guidelines. For example, "low in fat" means no more than 3 g of fat per serving. Lean.  "Lean" and "extra lean" are U.S. Department of Agriculture Scientist, research (physical sciences)) terms for use on meat and poultry products. "Lean" means the product contains less than 10 g of fat, 4 g of saturated fat, and 95 mg of cholesterol per serving. "Lean" is not as low in fat as a product labeled "low." Extra Lean.  "Extra lean" means the product contains less than 5 g of fat, 2 g of saturated fat, and 95 mg of cholesterol per serving. While "extra lean" has less fat than "lean," it is still higher in fat than a product labeled "low." Reduced, Less, Fewer.  A diet product that contains 25% less of a nutrient or calories than the regular version. For example, hot dogs might be labeled "25% less fat than our regular hot dogs." Light/Lite.  A diet product that  contains  fewer calories or  the fat of the original. For example, "light in sodium" means a product with  the usual sodium. More.  One serving contains at least 10% more of the daily value of a vitamin, mineral, or fiber than usual. Good Source Of.  One serving contains 10% to 19% of the daily value for a particular vitamin, mineral, or fiber. Excellent Source Of.  One serving contains 20% or more of the daily value for a particular nutrient. Other terms used might be "high in" or "rich in." Enriched or Fortified.  The product contains added vitamins, minerals, or protein. Nutrition labeling must be used on enriched or fortified foods. Imitation.  The product has been altered so that it is lower in protein, vitamins, or minerals than the usual food,such as imitation peanut butter. Total Fat.  The number listed is the total of all fat found in a serving of the product. Under total fat, food labels must list saturated fat and trans fat, which are associated with raising bad cholesterol and an increased risk of heart blood vessel disease. Saturated Fat.  Mainly fats from animal-based sources. Some examples are red meat, cheese, cream, whole milk, and coconut oil. Trans Fat.  Found  in some fried snack foods, packaged foods, and fried restaurant foods. It is recommended you eat as close to 0 g of trans fat as possible, since it raises bad cholesterol and lowers good cholesterol. Polyunsaturated and Monounsaturated Fats.  More healthful fats. These fats are from plant sources. Total Carbohydrate.  The number of carbohydrate grams in a serving of the product. Under total carbohydrate are listed the other carbohydrate sources, such as dietary fiber and sugars. Dietary Fiber.  A carbohydrate from plant sources. Sugars.  Sugars listed on the label contain all naturally occurring sugars as well as added sugars. LABELING TERMS NOT REGULATED BY THE FDA Sugarless.  Table sugar (sucrose)  has not been added. However, the manufacturer may use another form of sugar in place of sucrose to sweeten the product. For example, sugar alcohols are used to sweeten foods. Sugar alcohols are a form of sugar but are not table sugar. If a product contains sugar alcohols in place of sucrose, it can still be labeled "sugarless." Low Salt, Salt-Free, Unsalted, No Salt, No Salt Added, Without Added Salt.  Food that is usually processed with salt has been made without salt. However, the food may contain sodium-containing additives, such as preservatives, leavening agents, or flavorings. Natural.  This term has no legal meaning. Organic.  Foods that are certified as organic have been inspected and approved by the USDA to ensure they are produced without pesticides, fertilizers containing synthetic ingredients, bioengineering, or ionizing radiation. Document Released: 11/21/2003 Document Revised: 02/10/2012 Document Reviewed: 06/08/2009 [facility] Patient Information 2014 [place], [place].
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
## 2013-12-17 LAB — COMPREHENSIVE METABOLIC PANEL WITH GFR
ALT: 18 U/L (ref 0–35)
AST: 16 U/L (ref 0–37)
Albumin: 4.2 g/dL (ref 3.5–5.2)
Alkaline Phosphatase: 127 U/L — ABNORMAL HIGH (ref 39–117)
BUN: 13 mg/dL (ref 6–23)
CO2: 30 meq/L (ref 19–32)
Calcium: 9.3 mg/dL (ref 8.4–10.5)
Chloride: 91 meq/L — ABNORMAL LOW (ref 96–112)
Creat: 0.7 mg/dL (ref 0.50–1.10)
Glucose, Bld: 255 mg/dL — ABNORMAL HIGH (ref 70–99)
Potassium: 4.4 meq/L (ref 3.5–5.3)
Sodium: 133 meq/L — ABNORMAL LOW (ref 135–145)
Total Bilirubin: 0.4 mg/dL (ref 0.3–1.2)
Total Protein: 7.7 g/dL (ref 6.0–8.3)

## 2013-12-17 LAB — FOLATE: Folate: >20 ng/mL

## 2013-12-17 LAB — MICROALBUMIN, URINE: MICROALB UR: 1.32 mg/dL (ref 0.00–1.89)

## 2013-12-17 LAB — CBC WITH DIFFERENTIAL/PLATELET
Basophils Absolute: 0.1 K/uL (ref 0.0–0.1)
Basophils Relative: 0 % (ref 0–1)
Eosinophils Absolute: 0.2 K/uL (ref 0.0–0.7)
Eosinophils Relative: 2 % (ref 0–5)
HCT: 47.4 % — ABNORMAL HIGH (ref 36.0–46.0)
Hemoglobin: 15.2 g/dL — ABNORMAL HIGH (ref 12.0–15.0)
Lymphocytes Relative: 28 % (ref 12–46)
Lymphs Abs: 3.6 K/uL (ref 0.7–4.0)
MCH: 27.1 pg (ref 26.0–34.0)
MCHC: 32.1 g/dL (ref 30.0–36.0)
MCV: 84.5 fL (ref 78.0–100.0)
Monocytes Absolute: 0.7 K/uL (ref 0.1–1.0)
Monocytes Relative: 6 % (ref 3–12)
Neutro Abs: 8.4 K/uL — ABNORMAL HIGH (ref 1.7–7.7)
Neutrophils Relative %: 64 % (ref 43–77)
Platelets: 424 K/uL — ABNORMAL HIGH (ref 150–400)
RBC: 5.61 MIL/uL — ABNORMAL HIGH (ref 3.87–5.11)
RDW: 14.6 % (ref 11.5–15.5)
WBC: 13 K/uL — ABNORMAL HIGH (ref 4.0–10.5)

## 2013-12-17 LAB — HEMOGLOBIN A1C
Hgb A1c MFr Bld: 10 % — ABNORMAL HIGH (ref <5.7)
Mean Plasma Glucose: 240 mg/dL — ABNORMAL HIGH (ref <117)

## 2013-12-17 LAB — VITAMIN D 25 HYDROXY (VIT D DEFICIENCY, FRACTURES): Vit D, 25-Hydroxy: 28 ng/mL — ABNORMAL LOW (ref 30–89)

## 2013-12-17 LAB — LIPID PANEL
CHOL/HDL RATIO: 3.7 ratio
Cholesterol: 128 mg/dL (ref 0–200)
HDL: 35 mg/dL — AB (ref >39)
LDL Cholesterol: 47 mg/dL (ref 0–99)
TRIGLYCERIDES: 230 mg/dL — AB (ref <150)
VLDL: 46 mg/dL — ABNORMAL HIGH (ref 0–40)

## 2013-12-17 LAB — TSH: TSH: 0.931 u[IU]/mL (ref 0.350–4.500)

## 2013-12-17 LAB — T4, FREE: FREE T4: 1.28 ng/dL (ref 0.80–1.80)

## 2013-12-17 LAB — VITAMIN B12: VITAMIN B 12: 1413 pg/mL — AB (ref 211–911)

## 2013-12-19 DIAGNOSIS — E785 Hyperlipidemia, unspecified: Secondary | ICD-10-CM | POA: Insufficient documentation

## 2013-12-19 DIAGNOSIS — R5381 Other malaise: Secondary | ICD-10-CM | POA: Insufficient documentation

## 2013-12-19 DIAGNOSIS — R5383 Other fatigue: Secondary | ICD-10-CM

## 2013-12-19 DIAGNOSIS — R202 Paresthesia of skin: Secondary | ICD-10-CM | POA: Insufficient documentation

## 2013-12-19 DIAGNOSIS — E782 Mixed hyperlipidemia: Secondary | ICD-10-CM | POA: Insufficient documentation

## 2013-12-19 DIAGNOSIS — J411 Mucopurulent chronic bronchitis: Secondary | ICD-10-CM | POA: Insufficient documentation

## 2013-12-20 LAB — RESPIRATORY CULTURE OR RESPIRATORY AND SPUTUM CULTURE

## 2013-12-22 ENCOUNTER — Encounter: Payer: Self-pay | Admitting: Family Medicine

## 2013-12-22 ENCOUNTER — Telehealth: Payer: Self-pay | Admitting: Family Medicine

## 2013-12-22 ENCOUNTER — Telehealth: Payer: Self-pay | Admitting: *Deleted

## 2013-12-22 DIAGNOSIS — J14 Pneumonia due to Hemophilus influenzae: Secondary | ICD-10-CM

## 2013-12-22 MED ORDER — AMOXICILLIN 875 MG PO TABS: 875.0000 mg | ORAL_TABLET | Freq: Two times a day (BID) | ORAL | Status: DC

## 2013-12-22 NOTE — Telephone Encounter (Signed)
See telephone encounter.

## 2013-12-22 NOTE — Telephone Encounter (Signed)
Pt called and stated that she received a VM from someone here this morning but that she could not hear it. After chart review noted MD called pt to give results from sputum culture and that she called in ABT to pharmacy. Called pt back and informed her of this. Pt appreciative and understanding.

## 2013-12-22 NOTE — Telephone Encounter (Signed)
Patient was seen as a new patient and had complaints of a cough for weeks. Did sputum cultures which showed multiple organisms:  Abundant Gram Negative Rods   Gram Stain  Few Gram Negative Cocci   Gram Stain  Few GRAM POSITIVE COCCI IN PAIRS In Chains   Gram Stain  Few Gram Positive Rods   Organism ID, Bacteria  Abundant HAEMOPHILUS INFLUENZAE    Non beta lactamase producing H. Flu: Will treat with Amoxil. Have attempted to contact patient but unable to reach. Have left message for her to contact me back at the office and let me know that she has received this message and that she will be starting the antibiotic that I sent in for her.

## 2013-12-29 ENCOUNTER — Ambulatory Visit (HOSPITAL_COMMUNITY)
Admission: RE | Admit: 2013-12-29 | Discharge: 2013-12-29 | Disposition: A | Discharge status: Home / Self Care | Payer: Medicare Other | Source: Ambulatory Visit | Attending: Family Medicine | Admitting: Family Medicine

## 2013-12-29 ENCOUNTER — Ambulatory Visit (HOSPITAL_COMMUNITY)
Admission: RE | Admit: 2013-12-29 | Discharge: 2013-12-29 | Disposition: A | Discharge status: Home / Self Care | Payer: Medicare Other | Source: Ambulatory Visit | Attending: Adult Health | Admitting: Adult Health

## 2013-12-29 ENCOUNTER — Other Ambulatory Visit: Payer: Self-pay | Admitting: Family Medicine

## 2013-12-29 DIAGNOSIS — J411 Mucopurulent chronic bronchitis: Secondary | ICD-10-CM

## 2013-12-29 DIAGNOSIS — G4733 Obstructive sleep apnea (adult) (pediatric): Secondary | ICD-10-CM

## 2013-12-29 DIAGNOSIS — Z9989 Dependence on other enabling machines and devices: Secondary | ICD-10-CM

## 2013-12-29 DIAGNOSIS — N644 Mastodynia: Secondary | ICD-10-CM | POA: Insufficient documentation

## 2014-01-11 ENCOUNTER — Ambulatory Visit: Payer: Medicaid Other | Admitting: Family Medicine

## 2014-01-18 ENCOUNTER — Ambulatory Visit: Payer: Medicaid Other | Admitting: Family Medicine

## 2014-01-26 ENCOUNTER — Ambulatory Visit: Payer: Medicaid Other | Admitting: Family Medicine

## 2014-02-02 ENCOUNTER — Telehealth: Payer: Self-pay

## 2014-02-02 NOTE — Telephone Encounter (Signed)
No the only forms I got was the medical records.

## 2014-02-02 NOTE — Telephone Encounter (Signed)
Yolanda [name] came in the office today and wants to know if there were 2 forms included in her medical records that were received from another office.  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  2) Handicap sticker renewal form She states that she needs them completed as soon as possible.  Please call her if there is an issue.

## 2014-02-16 ENCOUNTER — Ambulatory Visit: Payer: Medicaid Other | Admitting: Obstetrics & Gynecology

## 2014-02-18 ENCOUNTER — Encounter (INDEPENDENT_AMBULATORY_CARE_PROVIDER_SITE_OTHER): Payer: Self-pay

## 2014-02-18 ENCOUNTER — Other Ambulatory Visit (HOSPITAL_COMMUNITY)
Admission: RE | Admit: 2014-02-18 | Discharge: 2014-02-18 | Disposition: A | Discharge status: Home / Self Care | Payer: Medicare Other | Source: Ambulatory Visit | Attending: Obstetrics & Gynecology | Admitting: Obstetrics & Gynecology

## 2014-02-18 ENCOUNTER — Ambulatory Visit (INDEPENDENT_AMBULATORY_CARE_PROVIDER_SITE_OTHER): Payer: Medicare Other | Admitting: Obstetrics & Gynecology

## 2014-02-18 ENCOUNTER — Encounter: Payer: Self-pay | Admitting: Obstetrics & Gynecology

## 2014-02-18 VITALS — BP 110/80 | Ht 66.2 in | Wt 228.0 lb

## 2014-02-18 DIAGNOSIS — B373 Candidiasis of vulva and vagina: Secondary | ICD-10-CM

## 2014-02-18 DIAGNOSIS — Z1151 Encounter for screening for human papillomavirus (HPV): Secondary | ICD-10-CM | POA: Insufficient documentation

## 2014-02-18 DIAGNOSIS — Z124 Encounter for screening for malignant neoplasm of cervix: Secondary | ICD-10-CM | POA: Insufficient documentation

## 2014-02-18 DIAGNOSIS — B3731 Acute candidiasis of vulva and vagina: Secondary | ICD-10-CM

## 2014-02-18 MED ORDER — FLUCONAZOLE 100 MG PO TABS: 100.0000 mg | ORAL_TABLET | Freq: Every day | ORAL | Status: DC
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
## 2014-02-18 NOTE — Addendum Note (Signed)
Addended by: Linton Rump on: 02/18/2014 01:25 PM   Modules accepted: Orders

## 2014-03-07 ENCOUNTER — Ambulatory Visit: Payer: Medicare Other | Admitting: Obstetrics & Gynecology

## 2014-03-11 ENCOUNTER — Ambulatory Visit: Payer: Medicare Other | Admitting: Obstetrics & Gynecology

## 2014-03-14 ENCOUNTER — Ambulatory Visit: Payer: Medicare Other | Admitting: Obstetrics & Gynecology

## 2014-03-22 ENCOUNTER — Ambulatory Visit (INDEPENDENT_AMBULATORY_CARE_PROVIDER_SITE_OTHER): Payer: Medicare Other | Admitting: Obstetrics & Gynecology

## 2014-03-22 ENCOUNTER — Encounter: Payer: Self-pay | Admitting: Obstetrics & Gynecology

## 2014-03-22 VITALS — BP 126/80 | Wt 239.0 lb

## 2014-03-22 DIAGNOSIS — B373 Candidiasis of vulva and vagina: Secondary | ICD-10-CM

## 2014-03-22 DIAGNOSIS — B3731 Acute candidiasis of vulva and vagina: Secondary | ICD-10-CM
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
## 2014-04-21 ENCOUNTER — Encounter: Payer: Self-pay | Admitting: Obstetrics & Gynecology

## 2014-04-21 ENCOUNTER — Ambulatory Visit (INDEPENDENT_AMBULATORY_CARE_PROVIDER_SITE_OTHER): Payer: Medicare Other | Admitting: Obstetrics & Gynecology

## 2014-04-21 VITALS — BP 124/84 | Ht 66.0 in | Wt 236.5 lb

## 2014-04-21 DIAGNOSIS — N730 Acute parametritis and pelvic cellulitis: Secondary | ICD-10-CM | POA: Insufficient documentation

## 2014-04-21 DIAGNOSIS — N390 Urinary tract infection, site not specified: Secondary | ICD-10-CM | POA: Insufficient documentation

## 2014-04-21 DIAGNOSIS — N760 Acute vaginitis: Secondary | ICD-10-CM | POA: Insufficient documentation

## 2014-04-21 LAB — POCT URINALYSIS DIPSTICK
Ketones, UA: NEGATIVE
Nitrite, UA: POSITIVE
PROTEIN UA: NEGATIVE

## 2014-04-21 NOTE — Addendum Note (Signed)
Addended by: Doyne Keel on: 04/21/2014 11:51 AM   Modules accepted: Orders
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
## 2014-04-22 ENCOUNTER — Emergency Department (HOSPITAL_COMMUNITY): Payer: No Typology Code available for payment source

## 2014-04-22 ENCOUNTER — Emergency Department (HOSPITAL_COMMUNITY)
Admission: EM | Admit: 2014-04-22 | Discharge: 2014-04-22 | Disposition: A | Discharge status: Home / Self Care | Payer: No Typology Code available for payment source | Attending: Emergency Medicine | Admitting: Emergency Medicine

## 2014-04-22 ENCOUNTER — Encounter (HOSPITAL_COMMUNITY): Payer: Self-pay | Admitting: Emergency Medicine

## 2014-04-22 DIAGNOSIS — Z794 Long term (current) use of insulin: Secondary | ICD-10-CM | POA: Insufficient documentation

## 2014-04-22 DIAGNOSIS — Z8619 Personal history of other infectious and parasitic diseases: Secondary | ICD-10-CM | POA: Insufficient documentation

## 2014-04-22 DIAGNOSIS — IMO0002 Reserved for concepts with insufficient information to code with codable children: Secondary | ICD-10-CM | POA: Diagnosis present

## 2014-04-22 DIAGNOSIS — Z8742 Personal history of other diseases of the female genital tract: Secondary | ICD-10-CM | POA: Insufficient documentation

## 2014-04-22 DIAGNOSIS — F410 Panic disorder [episodic paroxysmal anxiety] without agoraphobia: Secondary | ICD-10-CM | POA: Diagnosis not present

## 2014-04-22 DIAGNOSIS — Z87828 Personal history of other (healed) physical injury and trauma: Secondary | ICD-10-CM | POA: Diagnosis not present

## 2014-04-22 DIAGNOSIS — I10 Essential (primary) hypertension: Secondary | ICD-10-CM | POA: Insufficient documentation

## 2014-04-22 DIAGNOSIS — E119 Type 2 diabetes mellitus without complications: Secondary | ICD-10-CM | POA: Diagnosis not present

## 2014-04-22 DIAGNOSIS — Z7982 Long term (current) use of aspirin: Secondary | ICD-10-CM | POA: Insufficient documentation

## 2014-04-22 DIAGNOSIS — F319 Bipolar disorder, unspecified: Secondary | ICD-10-CM | POA: Insufficient documentation

## 2014-04-22 DIAGNOSIS — Z79899 Other long term (current) drug therapy: Secondary | ICD-10-CM | POA: Insufficient documentation

## 2014-04-22 DIAGNOSIS — Y9389 Activity, other specified: Secondary | ICD-10-CM | POA: Diagnosis not present

## 2014-04-22 DIAGNOSIS — K219 Gastro-esophageal reflux disease without esophagitis: Secondary | ICD-10-CM | POA: Diagnosis not present

## 2014-04-22 DIAGNOSIS — Z3202 Encounter for pregnancy test, result negative: Secondary | ICD-10-CM | POA: Insufficient documentation

## 2014-04-22 DIAGNOSIS — Y92410 Unspecified street and highway as the place of occurrence of the external cause: Secondary | ICD-10-CM | POA: Insufficient documentation

## 2014-04-22 DIAGNOSIS — E785 Hyperlipidemia, unspecified: Secondary | ICD-10-CM | POA: Insufficient documentation

## 2014-04-22 DIAGNOSIS — M129 Arthropathy, unspecified: Secondary | ICD-10-CM | POA: Insufficient documentation

## 2014-04-22 DIAGNOSIS — G8929 Other chronic pain: Secondary | ICD-10-CM | POA: Diagnosis not present

## 2014-04-22 DIAGNOSIS — T148XXA Other injury of unspecified body region, initial encounter: Secondary | ICD-10-CM

## 2014-04-22 HISTORY — DX: Dorsalgia, unspecified: M54.9

## 2014-04-22 HISTORY — DX: Other chronic pain: G89.29

## 2014-04-22 LAB — POC URINE PREG, ED: Preg Test, Ur: NEGATIVE

## 2014-04-22 LAB — GC/CHLAMYDIA PROBE AMP
CT Probe RNA: NEGATIVE
GC PROBE AMP APTIMA: NEGATIVE
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
## 2014-04-22 NOTE — Discharge Instructions (Signed)
°Emergency Department Resource Guide °1) Find a Doctor and Pay Out of Pocket °Although you won't have to find out who is covered by your insurance plan, it is a good idea to ask around and get recommendations. You will then need to call the office and see if the doctor you have chosen will accept you as a new patient and what types of options they offer for patients who are self-pay. Some doctors offer discounts or will set up payment plans for their patients who do not have insurance, but you will need to ask so you aren't surprised when you get to your appointment. ° °2) Contact Your Local Health Department °Not all health departments have doctors that can see patients for sick visits, but many do, so it is worth a call to see if yours does. If you don't know where your local health department is, you can check in your phone book. The CDC also has a tool to help you locate your state's health department, and many state websites also have listings of all of their local health departments. ° °3) Find a Walk-in Clinic °If your illness is not likely to be very severe or complicated, you may want to try a walk in clinic. These are popping up all over the country in pharmacies, drugstores, and shopping centers. They're usually staffed by nurse practitioners or physician assistants that have been trained to treat common illnesses and complaints. They're usually fairly quick and inexpensive. However, if you have serious medical issues or chronic medical problems, these are probably not your best option. ° °No Primary Care Doctor: °- Call [facility] at  832-8000 - they can help you locate a primary care doctor that  accepts your insurance, provides certain services, etc. °- Physician Referral Service- 1-800-533-3463 ° °Chronic Pain Problems: °Organization         Address  Phone   Notes  °[facility]  (336) 297-2271 Patients need to be referred by their primary care doctor.  ° °Medication  Assistance: °Organization         Address  Phone   Notes  °[place] [facility] [address] °[place], [place] [address] (336) 641-8030 --Must be a resident of [place] °-- Must have NO insurance coverage whatsoever (no Medicaid/ Medicare, etc.) °-- The pt. MUST have a primary care doctor that directs their care regularly and follows them in the community °  °[facility]  (866) 331-1348   °[employer]  (888) 892-1162   ° °Agencies that provide inexpensive medical care: °Organization         Address  Phone   Notes  °[facility]  (336) 832-8035   ° Internal Medicine    (336) 832-7272   °[facility] [address] °[place], [place] [address] (336) 832-4777   °[facility] [address], °[place] (336) 271-4999   °[facility]    (336) 373-0678   °[facility]    (336) 272-1050   °[facility] ° [address], [place] Phone:  (336) 832-4444, Fax:  (336) 832-4440 Hours of Operation:  9 am - 6 pm, M-F.  Also accepts Medicaid/Medicare and self-pay.  °[facility] ° [address], [place] Phone: (336) 832-3150, Fax: (336) [phone]. Hours of Operation:  8:30 am - 5:30 pm, M-F.  Also accepts Medicaid and self-pay.  °[facility] [place] [address], [place] Phone: (336) 878-6027   °[facility] [address], [place], [place] (336)723-1848, Ext. 123 Mondays & Thursdays: 7-9 AM.  First 15 patients are seen on a first come, first serve basis. °  ° °Medicaid-accepting [place] Providers: ° °Organization         Address  Phone   Notes  °[facility] [address], [place] (336) 641-2100 Also accepts self-pay patients.  °[facility] [address], [place] ° (336) 856-9996   °[facility] [address], [place]  (336) 288-8857   °[facility] [address] [place] [address], [place] (336) 299-7000   °Veita Bland [address], [place]  ° (336) 373-1557 Only accepts [place] Access Medicaid patients after they have their name applied to their card.  ° °Self-Pay (no insurance) in [place]: ° °Organization         Address  Phone   Notes  °Sickle Cell Patients, [facility] [address], [place] (336) 832-1970   °[facility] [address], [place] (336) 832-4400   °[facility] ° [address], [place] (336) 992-4800   °[facility]/Dr. Osei-Bonsu ° [address] [place] [address], [place] or [address], [place] (336) 841-8500 Phone number for both [place] and [place] locations is the same.  °[facility] [address], [place] (336) 299-0000   °[facility] [address] [place] [address], [place] or [address] (336) 852-7530 °(336) 878-2260   °[facility] [address], [place] (336) 350-1642, phone; (336) 294-5005, fax Sees patients 1st and 3rd Saturday of every month.  Must not qualify for public or private insurance (i.e. Medicaid, Medicare, [place] Health Choice, Veterans' Benefits) • Household income should be no more than 200% of the poverty level •The clinic cannot treat you if you are pregnant or think you are pregnant • Sexually transmitted diseases are not treated at the clinic.  ° ° °Dental Care: °Organization         Address  Phone  Notes  °[place] [facility] [address], [place] (336) 641-6152 Accepts children up to age 21 who are enrolled in Medicaid or [place] Health Choice; pregnant women with a Medicaid card; and children who have applied for Medicaid or [place] Health Choice, but were declined, whose parents can pay a reduced fee at time of service.  °[place]  [facility] [place]  [address], [place] (336) 641-7733 Accepts children up to age 21 who are enrolled in Medicaid or [place] Health Choice; pregnant women with a Medicaid card; and children who have applied for Medicaid or [place] Health Choice, but were declined, whose parents can pay a reduced fee at time of service.  °[facility] ° [address], [place] (336) 641-4533 Patients are seen by appointment only. Walk-ins are not accepted. [facility] will see patients 18 years of age and older. °Monday - Tuesday (8am-5pm) °Most Wednesdays (8:30-5pm) °$30 per visit, cash only  °[facility] ° [address], [place] (336) 641-4533 Patients are seen by appointment only. Walk-ins are not accepted. [facility] will see patients 18 years of age and older. °One   Wednesday Evening (Monthly: Volunteer Based).  $30 per visit, cash only  °[facility]  (919) 537-3737 for adults; Children under age 4, call Graduate Pediatric Dentistry at (919) [phone]. Children aged 4-14, please call ([phone]) [phone] to request a pediatric application. ° Dental services are provided in all areas of dental care including fillings, crowns and bridges, complete and partial dentures, implants, gum treatment, root canals, and extractions. Preventive care is also provided. Treatment is provided to both adults and children. °Patients are selected via a lottery and there is often a waiting list. °  °[facility] [address], °[place] ° (336) 763-8833 [email or web address] °  °[facility] [address], [place], [place] (336)723-1848, Ext. 123 Second and Fourth Thursday of each month, opens at 6:30 AM; Clinic ends at 9 AM.  Patients are seen on a first-come first-served basis, and a limited number are seen during each clinic.  ° °[facility] ° [address], [place], [place] (336) 723-7904    Eligibility Requirements °You must have lived in [place], [place], or [place] counties for at least the last three months. °  You cannot be eligible for state or federal sponsored healthcare insurance, including Veterans Administration, Medicaid, or Medicare. °  You generally cannot be eligible for healthcare insurance through your employer.  °  How to apply: °Eligibility screenings are held every Tuesday and Wednesday afternoon from 1:00 pm until 4:00 pm. You do not need an appointment for the interview!  °[facility] [address], [place], [place] 336-631-2330   °[facility]  336-342-8273   °[facility]  336-703-3100   °[facility]  336-570-6415   ° °Behavioral Health Resources in the Community: °Intensive Outpatient Programs °Organization         Address  Phone  Notes  °[facility] [address], [place], [place] 336-878-6098   °[facility] [address], [place], [place] 336-832-9800   °[facility] [address], [place], [place] ° 336-882-2125   °[facility] [address],  °[place], [place] 1-800-853-5163 or 336-641-4981   °Substance Abuse Resources °Organization         Address  Phone  Notes  °[facility]  336-882-2125   °[facility]  336-784-9470   °[facility]  336-285-9073   °[facility]  336-845-3988   °Residential & Outpatient Substance Abuse Program  1-800-659-3381   °Psychological Services °Organization         Address  Phone  Notes  °[facility]  336- 832-9600   °[facility]  336- 378-7881   °[facility] [address], [place] 1-800-853-5163 or 336-641-4981   ° °Mobile Crisis Teams °Organization         Address  Phone  Notes  °[facility], Mobile Crisis Care Unit  1-877-626-1772   °Assertive °[facility] ° [address]  [place], [place] 336-834-9664   °Sharon DeEsch [address] °[place]  336-554-5454   ° °Self-Help/Support Groups °Organization         Address  Phone             Notes  °[facility] [place] - variety of support groups  336- 373-1402 Call for more information  °Narcotics Anonymous (NA), [facility] [address], °[place]   2 meetings at this location  ° °  Residential Treatment Programs Organization         Address  Phone  Notes  [facility] [address],    [place]  1-425-700-4465   [facility]  [address] [place] [address], [place], [place]   [place], [place] 506-462-9573 Admissions: 8am-3pm M-F  [facility] [place] [address]    [place], [place] 734-287-6811   [facility] [address] [place], [place], [place]   [facility] [address],  [place], [place]   [facility] Intensive Outpatient [address], [place], [place]   [facility] [address]  [place], [place] 1-754-110-8056 or 445-804-0176   [facility] [address], [place], [place] Accepts Medicaid  [facility] [address]  [employer] [place] 1-724-746-7045 Substance Abuse/Addiction Treatment   [facility]         Address  Phone  Notes  [facility]  479-087-3928   Domenic Schwab, PhD [address] [place], [place]   6843656967 or 434-286-5177   [place], [place] 917-122-8522   [facility] [address], [place], [place] [phone] Insurance/Medicaid/sponsorship through [place] [facility] [address]                                    [place], [place] [phone] [place] [address] [place] [address], [place] [phone]    Dr. Adele Schilder  [phone]   [facility] Dept. 1) [address] [place] 2) [place] 3)  [address], [place] [phone] [phone]  (269)[phone]   [place] [phone] or ([phone] (After Hours)       Take your usual prescriptions as previously directed.  Apply moist heat or ice to the area(s) of discomfort, for 15 minutes at a time, several times per day for the next few days.  Do not fall asleep on a heating or ice pack.  Call your regular medical doctor on Monday to schedule a follow up appointment in the next 3 days.  Return to the Emergency Department immediately if worsening.

## 2014-04-24 LAB — URINE CULTURE: Colony Count: >=100000

## 2014-04-26 ENCOUNTER — Telehealth: Payer: Self-pay | Admitting: *Deleted

## 2014-04-26 NOTE — Telephone Encounter (Signed)
Jonni Sanger from [facility] called about pt. Pt's Rx didn't go through [facility]. I called in Rx's to pharmacy, Flagyl 567m BID X 10 days and Ciprofloxacin 5063mBID x 10 days per Dr. [place]onda [place]

## 2014-05-06 ENCOUNTER — Ambulatory Visit: Payer: Medicare Other | Admitting: Obstetrics & Gynecology

## 2014-05-06 ENCOUNTER — Encounter: Payer: Self-pay | Admitting: Obstetrics & Gynecology

## 2014-05-06 ENCOUNTER — Ambulatory Visit (INDEPENDENT_AMBULATORY_CARE_PROVIDER_SITE_OTHER): Payer: Medicare Other | Admitting: Obstetrics & Gynecology

## 2014-05-06 VITALS — BP 140/90 | Wt 230.0 lb

## 2014-05-06 DIAGNOSIS — N760 Acute vaginitis: Secondary | ICD-10-CM

## 2014-05-06 DIAGNOSIS — B373 Candidiasis of vulva and vagina: Secondary | ICD-10-CM

## 2014-05-06 DIAGNOSIS — N762 Acute vulvitis: Secondary | ICD-10-CM

## 2014-05-06 DIAGNOSIS — B3731 Acute candidiasis of vulva and vagina: Secondary | ICD-10-CM

## 2014-05-06 MED ORDER — CLOBETASOL PROPIONATE 0.05 % EX CREA: 1.0000 "application " | TOPICAL_CREAM | Freq: Two times a day (BID) | CUTANEOUS | Status: DC

## 2014-05-06 MED ORDER — FLUCONAZOLE 150 MG PO TABS: 150.0000 mg | ORAL_TABLET | Freq: Once | ORAL | Status: DC

## 2014-05-06 NOTE — Progress Notes (Signed)
   Subjective:    Patient ID: Yolanda [name], female    DOB: Aug 26, 1971, 43 y.o.   MRN: [id number]  HPI Pt comes back in for follow up of her last visit.  Progress Notes    Patient ID: Yolanda [name], female DOB: Jan 14, 1971, 43 y.o. MRN: [id number]  Pt started hurting last Friday, severe, lasted through til Monday  Had sex Monday and Tuesday , has had sex about 5 times over the past month or so  Also heavy malodorous discharge  Pain with urination  Some pain with BM  No fever  ROS  No burning with urination, frequency or urgency  No nausea, vomiting or diarrhea  Nor fever chills or other constitutional symptoms  Exam  NEFG  Vagina heavy discharge  Wet prep performed: heavy WBC and lots of clue cells, no yeast no trichomonas  Cervix +CMT  Uterus tender  Adnexa negative  UA: +nitrates  Impression  PID  UTI  BV  Plan  Flagyl 500 BID x 10 days  Ciprofloxacin 500 x 10 days  Follow up 2 weeks    Pt feels significantly improved over her visit 2 weeks ago No bleeding No new symptoms     Review of Systems No burning with urination, frequency or urgency No nausea, vomiting or diarrhea Nor fever chills or other constitutional symptoms     Objective:   Physical Exam  Some red irritation of the right vulva Vagina + discharge wet prep _BV - trich + yeast Cervix normal appearance no CMT Uterus normal non tender      Assessment & Plan:  1.  EEF:EOFHQRFX  2.  BV: resolved  3.  Yeast vaginitis:  Use diflucan 150 mg BID  4.  Vulvitis:  Temovate 0.05% BID  Follow up prn

## 2014-05-19 ENCOUNTER — Emergency Department (HOSPITAL_COMMUNITY)
Admission: EM | Admit: 2014-05-19 | Discharge: 2014-05-19 | Disposition: A | Discharge status: Home / Self Care | Payer: Medicare Other | Attending: Emergency Medicine | Admitting: Emergency Medicine

## 2014-05-19 ENCOUNTER — Encounter (HOSPITAL_COMMUNITY): Payer: Self-pay | Admitting: Emergency Medicine

## 2014-05-19 DIAGNOSIS — F411 Generalized anxiety disorder: Secondary | ICD-10-CM | POA: Insufficient documentation

## 2014-05-19 DIAGNOSIS — G4730 Sleep apnea, unspecified: Secondary | ICD-10-CM | POA: Diagnosis not present

## 2014-05-19 DIAGNOSIS — Z9981 Dependence on supplemental oxygen: Secondary | ICD-10-CM | POA: Diagnosis not present

## 2014-05-19 DIAGNOSIS — Z87828 Personal history of other (healed) physical injury and trauma: Secondary | ICD-10-CM | POA: Diagnosis not present

## 2014-05-19 DIAGNOSIS — K219 Gastro-esophageal reflux disease without esophagitis: Secondary | ICD-10-CM | POA: Diagnosis not present

## 2014-05-19 DIAGNOSIS — Z7982 Long term (current) use of aspirin: Secondary | ICD-10-CM | POA: Diagnosis not present

## 2014-05-19 DIAGNOSIS — G8929 Other chronic pain: Secondary | ICD-10-CM | POA: Diagnosis not present

## 2014-05-19 DIAGNOSIS — Z8742 Personal history of other diseases of the female genital tract: Secondary | ICD-10-CM | POA: Diagnosis not present

## 2014-05-19 DIAGNOSIS — IMO0002 Reserved for concepts with insufficient information to code with codable children: Secondary | ICD-10-CM

## 2014-05-19 DIAGNOSIS — M1710 Unilateral primary osteoarthritis, unspecified knee: Secondary | ICD-10-CM | POA: Diagnosis not present

## 2014-05-19 DIAGNOSIS — Y9389 Activity, other specified: Secondary | ICD-10-CM | POA: Insufficient documentation

## 2014-05-19 DIAGNOSIS — M19019 Primary osteoarthritis, unspecified shoulder: Secondary | ICD-10-CM | POA: Diagnosis not present

## 2014-05-19 DIAGNOSIS — Z043 Encounter for examination and observation following other accident: Secondary | ICD-10-CM | POA: Insufficient documentation

## 2014-05-19 DIAGNOSIS — E785 Hyperlipidemia, unspecified: Secondary | ICD-10-CM | POA: Insufficient documentation

## 2014-05-19 DIAGNOSIS — E119 Type 2 diabetes mellitus without complications: Secondary | ICD-10-CM | POA: Insufficient documentation

## 2014-05-19 DIAGNOSIS — G4733 Obstructive sleep apnea (adult) (pediatric): Secondary | ICD-10-CM | POA: Diagnosis not present

## 2014-05-19 DIAGNOSIS — Y92410 Unspecified street and highway as the place of occurrence of the external cause: Secondary | ICD-10-CM | POA: Insufficient documentation

## 2014-05-19 DIAGNOSIS — I10 Essential (primary) hypertension: Secondary | ICD-10-CM | POA: Diagnosis not present

## 2014-05-19 DIAGNOSIS — Z79899 Other long term (current) drug therapy: Secondary | ICD-10-CM | POA: Diagnosis not present

## 2014-05-19 DIAGNOSIS — Z794 Long term (current) use of insulin: Secondary | ICD-10-CM | POA: Diagnosis not present

## 2014-05-19 DIAGNOSIS — F319 Bipolar disorder, unspecified: Secondary | ICD-10-CM | POA: Diagnosis not present

## 2014-05-19 DIAGNOSIS — Z8619 Personal history of other infectious and parasitic diseases: Secondary | ICD-10-CM | POA: Diagnosis not present

## 2014-05-19 MED ORDER — DIAZEPAM 5 MG PO TABS: 5.0000 mg | ORAL_TABLET | Freq: Two times a day (BID) | ORAL | Status: AC

## 2014-05-19 MED ORDER — IBUPROFEN 800 MG PO TABS: 800.0000 mg | ORAL_TABLET | Freq: Three times a day (TID) | ORAL | Status: AC
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
## 2014-05-24 ENCOUNTER — Telehealth: Payer: Self-pay | Admitting: Obstetrics & Gynecology

## 2014-05-24 NOTE — Telephone Encounter (Signed)
Pt states has a reoccurring discharge with odor. Informed pt would need an appt the phone call got disconnected. Pt does have an appt with Dr. Elonda Husky 05/26/14.

## 2014-05-26 ENCOUNTER — Encounter: Payer: Self-pay | Admitting: Obstetrics & Gynecology

## 2014-05-26 ENCOUNTER — Ambulatory Visit (INDEPENDENT_AMBULATORY_CARE_PROVIDER_SITE_OTHER): Payer: Medicare Other | Admitting: Obstetrics & Gynecology

## 2014-05-26 VITALS — BP 120/80 | Wt 228.0 lb

## 2014-05-26 DIAGNOSIS — N760 Acute vaginitis: Secondary | ICD-10-CM

## 2014-05-26 MED ORDER — METRONIDAZOLE 0.75 % VA GEL: VAGINAL | Status: DC

## 2014-05-26 NOTE — Progress Notes (Signed)
Patient ID: Yolanda [name], female   DOB: 01/05/1971, 43 y.o.   MRN: [id number] Blood pressure 120/80, weight 228 lb (103.42 kg).  Pt with malodorous discharge, sticky  Exam Scant greyish discharge  Wet prep +BV -yeast -trich  BV: metrogel

## 2014-06-09 ENCOUNTER — Other Ambulatory Visit: Payer: Self-pay | Admitting: Obstetrics & Gynecology

## 2014-06-24 ENCOUNTER — Other Ambulatory Visit: Payer: Self-pay | Admitting: Obstetrics & Gynecology

## 2014-08-02 ENCOUNTER — Encounter: Payer: Self-pay | Admitting: Obstetrics & Gynecology

## 2014-08-02 ENCOUNTER — Ambulatory Visit (INDEPENDENT_AMBULATORY_CARE_PROVIDER_SITE_OTHER): Payer: Medicare Other | Admitting: Obstetrics & Gynecology

## 2014-08-02 VITALS — BP 116/86 | Ht 68.0 in | Wt 235.0 lb

## 2014-08-02 DIAGNOSIS — L738 Other specified follicular disorders: Secondary | ICD-10-CM

## 2014-08-02 DIAGNOSIS — Z87440 Personal history of urinary (tract) infections: Secondary | ICD-10-CM

## 2014-08-02 DIAGNOSIS — L678 Other hair color and hair shaft abnormalities: Secondary | ICD-10-CM

## 2014-08-02 DIAGNOSIS — R81 Glycosuria: Secondary | ICD-10-CM

## 2014-08-02 DIAGNOSIS — Z131 Encounter for screening for diabetes mellitus: Secondary | ICD-10-CM

## 2014-08-02 DIAGNOSIS — R7309 Other abnormal glucose: Secondary | ICD-10-CM

## 2014-08-02 DIAGNOSIS — L739 Follicular disorder, unspecified: Secondary | ICD-10-CM

## 2014-08-02 LAB — POCT URINALYSIS DIPSTICK
Glucose, UA: 4
Ketones, UA: NEGATIVE
Leukocytes, UA: NEGATIVE
Nitrite, UA: NEGATIVE
PROTEIN UA: NEGATIVE
RBC UA: NEGATIVE

## 2014-08-02 LAB — GLUCOSE, POCT (MANUAL RESULT ENTRY): POC Glucose: 366 mg/dl — AB (ref 70–99)

## 2014-08-02 MED ORDER — MINOCYCLINE HCL 100 MG PO CAPS: 100.0000 mg | ORAL_CAPSULE | Freq: Two times a day (BID) | ORAL | Status: DC

## 2014-08-02 NOTE — Progress Notes (Signed)
Patient ID: Yolanda [name], female   DOB: 09/19/1971, 43 y.o.   MRN: [id number] Chief Complaint  Patient presents with  . bump in vaginal area    HPI Has a bump which she popped for 3-4 days Blood came out no infection that she saw   ROS No burning with urination, frequency or urgency No nausea, vomiting or diarrhea Nor fever chills or other constitutional symptoms   Blood pressure 116/86, height 5' 8"  (1.727 m), weight 235 lb (106.595 kg).  EXAM Abdomen:       Vulva:            Infected hair follicles Vagina:           Cervix:            Uterus:           Adnexa:          Rectal:            Hemoccult:       GV placed                          Assessment/Plan:  Folliculitis  Doxycycline 100 BID x 10 days

## 2014-09-08 ENCOUNTER — Encounter: Payer: Self-pay | Admitting: Obstetrics & Gynecology

## 2014-09-08 ENCOUNTER — Ambulatory Visit (INDEPENDENT_AMBULATORY_CARE_PROVIDER_SITE_OTHER): Payer: Medicare Other | Admitting: Obstetrics & Gynecology

## 2014-09-08 VITALS — BP 120/80 | Wt 236.0 lb

## 2014-09-08 DIAGNOSIS — R232 Flushing: Secondary | ICD-10-CM

## 2014-09-08 DIAGNOSIS — N760 Acute vaginitis: Secondary | ICD-10-CM

## 2014-09-08 DIAGNOSIS — B9689 Other specified bacterial agents as the cause of diseases classified elsewhere: Secondary | ICD-10-CM

## 2014-09-08 DIAGNOSIS — A499 Bacterial infection, unspecified: Secondary | ICD-10-CM

## 2014-09-08 DIAGNOSIS — N951 Menopausal and female climacteric states: Secondary | ICD-10-CM

## 2014-09-08 MED ORDER — METRONIDAZOLE 0.75 % VA GEL: VAGINAL | Status: DC

## 2014-09-08 MED ORDER — ESTRADIOL 2 MG PO TABS: 2.0000 mg | ORAL_TABLET | Freq: Every day | ORAL | Status: DC
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
## 2014-09-09 ENCOUNTER — Encounter: Payer: Medicare Other | Attending: "Endocrinology | Admitting: Nutrition

## 2014-09-09 ENCOUNTER — Encounter: Payer: Self-pay | Admitting: Nutrition

## 2014-09-09 VITALS — Ht 68.0 in | Wt 238.2 lb

## 2014-09-09 DIAGNOSIS — E1165 Type 2 diabetes mellitus with hyperglycemia: Secondary | ICD-10-CM

## 2014-09-09 DIAGNOSIS — E1159 Type 2 diabetes mellitus with other circulatory complications: Secondary | ICD-10-CM | POA: Diagnosis present

## 2014-09-09 DIAGNOSIS — E1151 Type 2 diabetes mellitus with diabetic peripheral angiopathy without gangrene: Secondary | ICD-10-CM

## 2014-09-09 DIAGNOSIS — IMO0002 Reserved for concepts with insufficient information to code with codable children: Secondary | ICD-10-CM

## 2014-09-09 DIAGNOSIS — Z713 Dietary counseling and surveillance: Secondary | ICD-10-CM | POA: Diagnosis not present
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
## 2014-09-09 NOTE — Patient Instructions (Signed)
Plan:  Aim for 3-4 Carb Choices per meal (30-45 grams) +/- 1 either way  Avoid snacks if possible. Increase water intake to 10 cups per day. Cut out coffee lattte's and chocolate milk Include protein in moderation with your meals and snacks Consider reading food labels for Total Carbohydrate and Fat Grams of foods Consider  increasing your activity level by 15 for 30 minutes daily as tolerated Consider checking BG at alternate times per day as directed by MD  Take medications ONLY as directed by MD Goal: Get A1c down to less than 9% in three months.           Lose 1-2 lbs per week.  3. Keep a food journal and BS log and bring at each visit.

## 2014-09-28 ENCOUNTER — Encounter: Payer: Medicare Other | Admitting: Nutrition

## 2014-10-03 ENCOUNTER — Encounter: Payer: Self-pay | Admitting: Nutrition

## 2014-10-10 ENCOUNTER — Ambulatory Visit: Payer: Medicare Other | Admitting: Obstetrics & Gynecology

## 2014-10-18 ENCOUNTER — Ambulatory Visit: Payer: Medicare Other | Admitting: Obstetrics & Gynecology

## 2014-10-20 ENCOUNTER — Ambulatory Visit: Payer: Medicare Other | Admitting: Obstetrics & Gynecology

## 2014-10-25 ENCOUNTER — Ambulatory Visit: Payer: Medicare Other | Admitting: Obstetrics & Gynecology

## 2015-01-27 ENCOUNTER — Other Ambulatory Visit (HOSPITAL_COMMUNITY): Payer: Self-pay | Admitting: Nurse Practitioner

## 2015-01-27 ENCOUNTER — Other Ambulatory Visit: Payer: Self-pay | Admitting: Obstetrics & Gynecology

## 2015-01-27 DIAGNOSIS — Z1231 Encounter for screening mammogram for malignant neoplasm of breast: Secondary | ICD-10-CM

## 2015-01-30 ENCOUNTER — Ambulatory Visit (HOSPITAL_COMMUNITY)
Admission: RE | Admit: 2015-01-30 | Discharge: 2015-01-30 | Disposition: A | Discharge status: Home / Self Care | Payer: Medicare Other | Source: Ambulatory Visit | Attending: Nurse Practitioner | Admitting: Nurse Practitioner

## 2015-01-30 DIAGNOSIS — Z1231 Encounter for screening mammogram for malignant neoplasm of breast: Secondary | ICD-10-CM | POA: Diagnosis not present

## 2015-02-28 ENCOUNTER — Ambulatory Visit (HOSPITAL_COMMUNITY)
Admission: RE | Admit: 2015-02-28 | Discharge: 2015-02-28 | Disposition: A | Discharge status: Home / Self Care | Payer: Medicare Other | Source: Ambulatory Visit | Attending: Nurse Practitioner | Admitting: Nurse Practitioner

## 2015-02-28 DIAGNOSIS — R000 Tachycardia, unspecified: Secondary | ICD-10-CM | POA: Insufficient documentation

## 2015-02-28 DIAGNOSIS — R002 Palpitations: Secondary | ICD-10-CM | POA: Diagnosis present

## 2015-02-28 DIAGNOSIS — I10 Essential (primary) hypertension: Secondary | ICD-10-CM | POA: Diagnosis not present

## 2015-02-28 NOTE — Progress Notes (Signed)
  Echocardiogram 2D Echocardiogram has been performed.  Yolanda [name] 02/28/2015, 1:06 PM

## 2015-05-10 ENCOUNTER — Encounter: Payer: Self-pay | Admitting: Cardiology

## 2015-05-10 ENCOUNTER — Ambulatory Visit (INDEPENDENT_AMBULATORY_CARE_PROVIDER_SITE_OTHER): Payer: Medicare Other | Admitting: Cardiology

## 2015-05-10 VITALS — BP 110/80 | HR 95 | Ht 67.0 in | Wt 238.0 lb

## 2015-05-10 DIAGNOSIS — I10 Essential (primary) hypertension: Secondary | ICD-10-CM | POA: Diagnosis not present

## 2015-05-10 DIAGNOSIS — R000 Tachycardia, unspecified: Secondary | ICD-10-CM

## 2015-05-10 DIAGNOSIS — R002 Palpitations: Secondary | ICD-10-CM | POA: Diagnosis not present

## 2015-05-10 NOTE — Patient Instructions (Signed)
Your physician recommends that you schedule a follow-up appointment in: to be determined after holter monitor   Your physician recommends that you continue on your current medications as directed. Please refer to the Current Medication list given to you today.    Your physician has recommended that you wear a holter monitor. Holter monitors are medical devices that record the heart's electrical activity. Doctors most often use these monitors to diagnose arrhythmias. Arrhythmias are problems with the speed or rhythm of the heartbeat. The monitor is a small, portable device. You can wear one while you do your normal daily activities. This is usually used to diagnose what is causing palpitations/syncope (passing out).      Thank you for choosing [facility] !
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
## 2015-05-11 ENCOUNTER — Encounter (HOSPITAL_COMMUNITY): Payer: Medicare Other

## 2015-05-11 ENCOUNTER — Other Ambulatory Visit: Payer: Self-pay | Admitting: Cardiology

## 2015-05-11 ENCOUNTER — Ambulatory Visit (HOSPITAL_COMMUNITY)
Admission: RE | Admit: 2015-05-11 | Discharge: 2015-05-11 | Disposition: A | Discharge status: Home / Self Care | Payer: Medicare Other | Source: Ambulatory Visit | Attending: Cardiology | Admitting: Cardiology

## 2015-05-11 DIAGNOSIS — R002 Palpitations: Secondary | ICD-10-CM | POA: Insufficient documentation

## 2015-05-11 NOTE — Addendum Note (Signed)
Addended by: Barbarann Ehlers A on: 05/11/2015 11:10 AM   Modules accepted: Orders

## 2015-07-25 LAB — HEMOGLOBIN A1C: Hgb A1c MFr Bld: 11.3 % — AB (ref 4.0–6.0)

## 2015-08-01 ENCOUNTER — Other Ambulatory Visit: Payer: Self-pay | Admitting: Obstetrics & Gynecology

## 2015-09-19 ENCOUNTER — Encounter: Payer: Self-pay | Admitting: Advanced Practice Midwife

## 2015-09-19 ENCOUNTER — Ambulatory Visit (INDEPENDENT_AMBULATORY_CARE_PROVIDER_SITE_OTHER): Payer: Medicare Other | Admitting: Advanced Practice Midwife

## 2015-09-19 VITALS — BP 120/96 | HR 99 | Ht 67.5 in | Wt 238.0 lb

## 2015-09-19 DIAGNOSIS — R350 Frequency of micturition: Secondary | ICD-10-CM

## 2015-09-19 LAB — POCT URINALYSIS DIPSTICK
NITRITE UA: NEGATIVE
RBC UA: NEGATIVE
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
## 2015-09-21 LAB — URINE CULTURE

## 2015-10-10 DIAGNOSIS — T85848A Pain due to other internal prosthetic devices, implants and grafts, initial encounter: Secondary | ICD-10-CM | POA: Insufficient documentation

## 2015-10-19 ENCOUNTER — Other Ambulatory Visit: Payer: Self-pay | Admitting: *Deleted

## 2015-10-20 ENCOUNTER — Other Ambulatory Visit: Payer: Self-pay | Admitting: *Deleted

## 2015-10-20 MED ORDER — FLUCONAZOLE 150 MG PO TABS: 150.0000 mg | ORAL_TABLET | Freq: Once | ORAL | Status: DC

## 2015-10-20 MED ORDER — FLUCONAZOLE 150 MG PO TABS: ORAL_TABLET | ORAL | Status: DC

## 2015-10-30 LAB — HEMOGLOBIN A1C: Hgb A1c MFr Bld: 10.9 % — AB (ref 4.0–6.0)

## 2015-11-02 ENCOUNTER — Encounter: Payer: Self-pay | Admitting: "Endocrinology

## 2015-11-02 ENCOUNTER — Ambulatory Visit (INDEPENDENT_AMBULATORY_CARE_PROVIDER_SITE_OTHER): Payer: Medicare Other | Admitting: "Endocrinology

## 2015-11-02 VITALS — BP 145/98 | HR 114 | Ht 68.0 in | Wt 224.6 lb

## 2015-11-02 DIAGNOSIS — E1165 Type 2 diabetes mellitus with hyperglycemia: Secondary | ICD-10-CM | POA: Diagnosis not present

## 2015-11-02 DIAGNOSIS — Z91199 Patient's noncompliance with other medical treatment and regimen due to unspecified reason: Secondary | ICD-10-CM

## 2015-11-02 DIAGNOSIS — E1151 Type 2 diabetes mellitus with diabetic peripheral angiopathy without gangrene: Secondary | ICD-10-CM

## 2015-11-02 DIAGNOSIS — IMO0002 Reserved for concepts with insufficient information to code with codable children: Secondary | ICD-10-CM

## 2015-11-02 DIAGNOSIS — Z9119 Patient's noncompliance with other medical treatment and regimen: Secondary | ICD-10-CM | POA: Diagnosis not present

## 2015-11-02 DIAGNOSIS — I10 Essential (primary) hypertension: Secondary | ICD-10-CM | POA: Diagnosis not present

## 2015-11-02 DIAGNOSIS — E785 Hyperlipidemia, unspecified: Secondary | ICD-10-CM | POA: Diagnosis not present

## 2015-11-02 NOTE — Patient Instructions (Signed)
Advice for weight management -For most of us the best way to lose weight is by diet management. Generally speaking, diet management means restricting carbohydrate consumption to minimum possible (and to unprocessed or minimally processed complex starch) and increasing protein intake (animal or plant source), fruits, and vegetables.  -Sticking to a routine mealtime to eat 3 meals a day and avoiding unnecessary snacks is shown to have a big role in weight control.  -It is better to avoid simple carbohydrates including: Cakes, Desserts, Ice Cream, Soda (diet and regular), Sweet Tea, Candies, Chips, Cookies, Artificial Sweeteners, and "Sugar-free" Products.   -Exercise: 30 minutes a day 3-4 days a week, or 150 minutes a week. Combine stretch, strength, and aerobic activities. You may seek evaluation by your heart doctor prior to initiating exercise if you have high risk for heart disease.  -If you are interested, we can schedule a visit with Janeth Crumpton, RDN, CDE for individualized nutrition education.  
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
## 2015-11-03 ENCOUNTER — Other Ambulatory Visit: Payer: Self-pay | Admitting: "Endocrinology

## 2015-11-08 ENCOUNTER — Other Ambulatory Visit: Payer: Self-pay | Admitting: "Endocrinology

## 2015-11-10 ENCOUNTER — Encounter (HOSPITAL_COMMUNITY): Payer: Self-pay | Admitting: Emergency Medicine

## 2015-11-10 ENCOUNTER — Emergency Department (HOSPITAL_COMMUNITY)
Admission: EM | Admit: 2015-11-10 | Discharge: 2015-11-10 | Disposition: A | Discharge status: Home / Self Care | Payer: Medicare Other | Attending: Emergency Medicine | Admitting: Emergency Medicine

## 2015-11-10 DIAGNOSIS — K219 Gastro-esophageal reflux disease without esophagitis: Secondary | ICD-10-CM | POA: Diagnosis not present

## 2015-11-10 DIAGNOSIS — Z794 Long term (current) use of insulin: Secondary | ICD-10-CM | POA: Diagnosis not present

## 2015-11-10 DIAGNOSIS — G4730 Sleep apnea, unspecified: Secondary | ICD-10-CM | POA: Insufficient documentation

## 2015-11-10 DIAGNOSIS — F319 Bipolar disorder, unspecified: Secondary | ICD-10-CM | POA: Diagnosis not present

## 2015-11-10 DIAGNOSIS — N390 Urinary tract infection, site not specified: Secondary | ICD-10-CM | POA: Diagnosis not present

## 2015-11-10 DIAGNOSIS — F410 Panic disorder [episodic paroxysmal anxiety] without agoraphobia: Secondary | ICD-10-CM | POA: Diagnosis not present

## 2015-11-10 DIAGNOSIS — E119 Type 2 diabetes mellitus without complications: Secondary | ICD-10-CM | POA: Insufficient documentation

## 2015-11-10 DIAGNOSIS — Z9981 Dependence on supplemental oxygen: Secondary | ICD-10-CM | POA: Insufficient documentation

## 2015-11-10 DIAGNOSIS — F603 Borderline personality disorder: Secondary | ICD-10-CM | POA: Insufficient documentation

## 2015-11-10 DIAGNOSIS — Z7982 Long term (current) use of aspirin: Secondary | ICD-10-CM | POA: Diagnosis not present

## 2015-11-10 DIAGNOSIS — N760 Acute vaginitis: Secondary | ICD-10-CM | POA: Insufficient documentation

## 2015-11-10 DIAGNOSIS — Z3202 Encounter for pregnancy test, result negative: Secondary | ICD-10-CM | POA: Diagnosis not present

## 2015-11-10 DIAGNOSIS — Z79899 Other long term (current) drug therapy: Secondary | ICD-10-CM | POA: Diagnosis not present

## 2015-11-10 DIAGNOSIS — I10 Essential (primary) hypertension: Secondary | ICD-10-CM | POA: Insufficient documentation

## 2015-11-10 DIAGNOSIS — M1990 Unspecified osteoarthritis, unspecified site: Secondary | ICD-10-CM | POA: Diagnosis not present

## 2015-11-10 DIAGNOSIS — G8929 Other chronic pain: Secondary | ICD-10-CM | POA: Insufficient documentation

## 2015-11-10 DIAGNOSIS — R102 Pelvic and perineal pain: Secondary | ICD-10-CM | POA: Diagnosis present

## 2015-11-10 DIAGNOSIS — F419 Anxiety disorder, unspecified: Secondary | ICD-10-CM | POA: Diagnosis not present

## 2015-11-10 DIAGNOSIS — E785 Hyperlipidemia, unspecified: Secondary | ICD-10-CM | POA: Insufficient documentation

## 2015-11-10 DIAGNOSIS — Z7984 Long term (current) use of oral hypoglycemic drugs: Secondary | ICD-10-CM | POA: Insufficient documentation

## 2015-11-10 DIAGNOSIS — B9689 Other specified bacterial agents as the cause of diseases classified elsewhere: Secondary | ICD-10-CM

## 2015-11-10 LAB — URINALYSIS, ROUTINE W REFLEX MICROSCOPIC
Bilirubin Urine: NEGATIVE
Glucose, UA: NEGATIVE mg/dL
Ketones, ur: NEGATIVE mg/dL
Nitrite: NEGATIVE
Protein, ur: 100 mg/dL — AB
Specific Gravity, Urine: 1.025 (ref 1.005–1.030)
pH: 6 (ref 5.0–8.0)

## 2015-11-10 LAB — PREGNANCY, URINE: Preg Test, Ur: NEGATIVE

## 2015-11-10 LAB — URINE MICROSCOPIC-ADD ON

## 2015-11-10 LAB — WET PREP, GENITAL
Sperm: NONE SEEN
TRICH WET PREP: NONE SEEN
YEAST WET PREP: NONE SEEN

## 2015-11-10 MED ORDER — METRONIDAZOLE 500 MG PO TABS: 500.0000 mg | ORAL_TABLET | Freq: Two times a day (BID) | ORAL | Status: DC

## 2015-11-10 MED ORDER — CEPHALEXIN 500 MG PO CAPS: 500.0000 mg | ORAL_CAPSULE | Freq: Four times a day (QID) | ORAL | Status: DC

## 2015-11-10 NOTE — ED Notes (Signed)
Pt alert & oriented x4, stable gait. Patient given discharge instructions, paperwork & prescription(s). Patient  instructed to stop at the registration desk to finish any additional paperwork. Patient verbalized understanding. Pt left department w/ no further questions. 

## 2015-11-10 NOTE — ED Notes (Signed)
Pt reports pelvic pain x 4 days. Pt reports pain with urination and difficulty urinating at times. Denies n/v/d, but recently had a stomach virus.

## 2015-11-10 NOTE — Discharge Instructions (Signed)
Bacterial Vaginosis Bacterial vaginosis is a vaginal infection that occurs when the normal balance of bacteria in the vagina is disrupted. It results from an overgrowth of certain bacteria. This is the most common vaginal infection in women of childbearing age. Treatment is important to prevent complications, especially in pregnant women, as it can cause a premature delivery. CAUSES  Bacterial vaginosis is caused by an increase in harmful bacteria that are normally present in smaller amounts in the vagina. Several different kinds of bacteria can cause bacterial vaginosis. However, the reason that the condition develops is not fully understood. RISK FACTORS Certain activities or behaviors can put you at an increased risk of developing bacterial vaginosis, including:  Having a new sex partner or multiple sex partners.  Douching.  Using an intrauterine device (IUD) for contraception. Women do not get bacterial vaginosis from toilet seats, bedding, swimming pools, or contact with objects around them. SIGNS AND SYMPTOMS  Some women with bacterial vaginosis have no signs or symptoms. Common symptoms include:  Grey vaginal discharge.  A fishlike odor with discharge, especially after sexual intercourse.  Itching or burning of the vagina and vulva.  Burning or pain with urination. DIAGNOSIS  Your health care provider will take a medical history and examine the vagina for signs of bacterial vaginosis. A sample of vaginal fluid may be taken. Your health care provider will look at this sample under a microscope to check for bacteria and abnormal cells. A vaginal pH test may also be done.  TREATMENT  Bacterial vaginosis may be treated with antibiotic medicines. These may be given in the form of a pill or a vaginal cream. A second round of antibiotics may be prescribed if the condition comes back after treatment. Because bacterial vaginosis increases your risk for sexually transmitted diseases, getting  treated can help reduce your risk for chlamydia, gonorrhea, HIV, and herpes. HOME CARE INSTRUCTIONS   Only take over-the-counter or prescription medicines as directed by your health care provider.  If antibiotic medicine was prescribed, take it as directed. Make sure you finish it even if you start to feel better.  Tell all sexual partners that you have a vaginal infection. They should see their health care provider and be treated if they have problems, such as a mild rash or itching.  During treatment, it is important that you follow these instructions:  Avoid sexual activity or use condoms correctly.  Do not douche.  Avoid alcohol as directed by your health care provider.  Avoid breastfeeding as directed by your health care provider. SEEK MEDICAL CARE IF:   Your symptoms are not improving after 3 days of treatment.  You have increased discharge or pain.  You have a fever. MAKE SURE YOU:   Understand these instructions.  Will watch your condition.  Will get help right away if you are not doing well or get worse. FOR MORE INFORMATION  Centers for Disease Control and Prevention, Division of STD Prevention: [employer] American Sexual Health Association (ASHA): www.ashastd.org    This information is not intended to replace advice given to you by your health care provider. Make sure you discuss any questions you have with your health care provider.   Document Released: 11/18/2005 Document Revised: 12/09/2014 Document Reviewed: 06/30/2013 Elsevier Interactive Patient Education 2016 [employer]  Urinary Tract Infection Urinary tract infections (UTIs) can develop anywhere along your urinary tract. Your urinary tract is your body's drainage system for removing wastes and extra water. Your urinary tract includes two kidneys, two ureters,  a bladder, and a urethra. Your kidneys are a pair of bean-shaped organs. Each kidney is about the size of your fist. They are located below  your ribs, one on each side of your spine. CAUSES Infections are caused by microbes, which are microscopic organisms, including fungi, viruses, and bacteria. These organisms are so small that they can only be seen through a microscope. Bacteria are the microbes that most commonly cause UTIs. SYMPTOMS  Symptoms of UTIs may vary by age and gender of the patient and by the location of the infection. Symptoms in young women typically include a frequent and intense urge to urinate and a painful, burning feeling in the bladder or urethra during urination. Older women and men are more likely to be tired, shaky, and weak and have muscle aches and abdominal pain. A fever may mean the infection is in your kidneys. Other symptoms of a kidney infection include pain in your back or sides below the ribs, nausea, and vomiting. DIAGNOSIS To diagnose a UTI, your caregiver will ask you about your symptoms. Your caregiver will also ask you to provide a urine sample. The urine sample will be tested for bacteria and white blood cells. White blood cells are made by your body to help fight infection. TREATMENT  Typically, UTIs can be treated with medication. Because most UTIs are caused by a bacterial infection, they usually can be treated with the use of antibiotics. The choice of antibiotic and length of treatment depend on your symptoms and the type of bacteria causing your infection. HOME CARE INSTRUCTIONS  If you were prescribed antibiotics, take them exactly as your caregiver instructs you. Finish the medication even if you feel better after you have only taken some of the medication.  Drink enough water and fluids to keep your urine clear or pale yellow.  Avoid caffeine, tea, and carbonated beverages. They tend to irritate your bladder.  Empty your bladder often. Avoid holding urine for long periods of time.  Empty your bladder before and after sexual intercourse.  After a bowel movement, women should cleanse  from front to back. Use each tissue only once. SEEK MEDICAL CARE IF:   You have back pain.  You develop a fever.  Your symptoms do not begin to resolve within 3 days. SEEK IMMEDIATE MEDICAL CARE IF:   You have severe back pain or lower abdominal pain.  You develop chills.  You have nausea or vomiting.  You have continued burning or discomfort with urination. MAKE SURE YOU:   Understand these instructions.  Will watch your condition.  Will get help right away if you are not doing well or get worse.   This information is not intended to replace advice given to you by your health care provider. Make sure you discuss any questions you have with your health care provider.   Document Released: 08/28/2005 Document Revised: 08/09/2015 Document Reviewed: 12/27/2011 Elsevier Interactive Patient Education [employer].

## 2015-11-11 LAB — RPR: RPR Ser Ql: NONREACTIVE

## 2015-11-11 LAB — HIV ANTIBODY (ROUTINE TESTING W REFLEX): HIV SCREEN 4TH GENERATION: NONREACTIVE
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
## 2015-11-13 ENCOUNTER — Ambulatory Visit: Payer: Medicare Other | Admitting: Obstetrics & Gynecology

## 2015-11-13 LAB — GC/CHLAMYDIA PROBE AMP (~~LOC~~) NOT AT ARMC
Chlamydia: NEGATIVE
NEISSERIA GONORRHEA: NEGATIVE

## 2015-11-14 LAB — URINE CULTURE: Special Requests: NORMAL

## 2015-11-15 ENCOUNTER — Telehealth (HOSPITAL_BASED_OUTPATIENT_CLINIC_OR_DEPARTMENT_OTHER): Payer: Self-pay | Admitting: Emergency Medicine

## 2015-11-15 NOTE — Telephone Encounter (Signed)
Post ED Visit - Positive Culture Follow-up  Culture report reviewed by antimicrobial stewardship pharmacist:  [x]  Elenor Quinones, Pharm.D. []  Heide Guile, Pharm.D., BCPS []  Parks Neptune, Pharm.D. []  Alycia Rossetti, Pharm.D., BCPS []  [place], Pharm.D., BCPS, AAHIVP []  Legrand Como, Pharm.D., BCPS, AAHIVP []  Milus Glazier, Pharm.D. []  Stephens November, Pharm.D.  Positive urine culture E. coli Treated with cephalexin, metronidazole, asymptomatic and no further patient follow-up is required at this time.  [place], [place] 11/15/2015, 12:00 PM

## 2015-11-17 ENCOUNTER — Encounter: Payer: Medicare Other | Attending: Nurse Practitioner | Admitting: Nutrition

## 2015-11-17 ENCOUNTER — Encounter: Payer: Self-pay | Admitting: Nutrition

## 2015-11-17 VITALS — Ht 67.0 in | Wt 223.0 lb

## 2015-11-17 DIAGNOSIS — Z713 Dietary counseling and surveillance: Secondary | ICD-10-CM | POA: Diagnosis not present

## 2015-11-17 DIAGNOSIS — Z6834 Body mass index (BMI) 34.0-34.9, adult: Secondary | ICD-10-CM | POA: Insufficient documentation

## 2015-11-17 DIAGNOSIS — Z794 Long term (current) use of insulin: Secondary | ICD-10-CM | POA: Diagnosis not present

## 2015-11-17 DIAGNOSIS — E1165 Type 2 diabetes mellitus with hyperglycemia: Secondary | ICD-10-CM | POA: Diagnosis not present

## 2015-11-17 DIAGNOSIS — E669 Obesity, unspecified: Secondary | ICD-10-CM

## 2015-11-17 DIAGNOSIS — IMO0002 Reserved for concepts with insufficient information to code with codable children: Secondary | ICD-10-CM

## 2015-11-17 DIAGNOSIS — E118 Type 2 diabetes mellitus with unspecified complications: Secondary | ICD-10-CM

## 2015-11-17 NOTE — Patient Instructions (Signed)
Goals 1. Follow My Plate MEthod 2. Do not skip meals. 3. Eat three balanced meals per day 4. 2-3  Carb choices per meal. 5. Cut out diet sodas, cakes, cookies, ice cream 6. Drink only water 7. Keep food journal 8. Get A1C down to 9% in three months.
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
## 2015-11-22 ENCOUNTER — Encounter: Payer: Medicare Other | Admitting: Nutrition

## 2015-11-22 ENCOUNTER — Encounter: Payer: Self-pay | Admitting: Nutrition

## 2015-11-22 VITALS — Ht 68.0 in | Wt 225.0 lb

## 2015-11-22 DIAGNOSIS — E1165 Type 2 diabetes mellitus with hyperglycemia: Secondary | ICD-10-CM

## 2015-11-22 DIAGNOSIS — IMO0002 Reserved for concepts with insufficient information to code with codable children: Secondary | ICD-10-CM

## 2015-11-22 DIAGNOSIS — E669 Obesity, unspecified: Secondary | ICD-10-CM

## 2015-11-22 DIAGNOSIS — Z794 Long term (current) use of insulin: Principal | ICD-10-CM

## 2015-11-22 DIAGNOSIS — E118 Type 2 diabetes mellitus with unspecified complications: Principal | ICD-10-CM

## 2015-11-22 NOTE — Patient Instructions (Signed)
Goals 1. Follow My Plate MEthod 2.Increase fresh fruits and low carb vegetables. 3. Eat three balanced meals per day 4. 2-3  Carb choices per meal. 5. Cut out diet sodas, cakes, cookies, ice cream 6. Drink only water- w ork on cutting out diet coke. 7. Keep food journal 8. Get A1C down to 9% in three months.

## 2015-11-22 NOTE — Progress Notes (Signed)
  Medical Nutrition Therapy:  Appt start time: 0800 end time:  0900.  Assessment:  Primary concerns today: Diabetes Type 2 follow up. Her young son is with her today. She brought her food journal which reveals she is not eating much fresh fruit or vegetables. Now eating breakfast daily. Not able to exercise. She states she is taking her insulin as prescribed. She admits to have a lot of stress issues and when she gets upset she wants to eat and sometimes may not take her insulin. Uses food for her anger and being upset. Knows that doing that makes her situation worse. Hasn't been injecting air in the vial of Levemir and has difficulity at times getting insulin out. Re educated on proper insulin dosing and techniques. Has cut down on diet sodas and drinking more water. Diet still very poor in fresh fruits and vegetables. Eating a lot of processed meats/foods that contribute to inflammation as well. No recent A1C. Most blood sugars in the 200-300's but some down to 140-190s when she eats right and takes her medications on timely schedule. Making slow progress with food changes and improving on medication compliance. She is on 40 units of Lantus, 15+ units of Novolog with meals, Metformin 1000 mg BID and Trajenta.  Preferred Learning Style:    Auditory  Visual  Learning Readiness:  Ready  Change in progress   MEDICATIONS: See list.   DIETARY INTAKE:  24-hr recall:  B ( AM): Chocolate milk,, egg, cheese, Snk ( AM):   L ( PM) 2 egg rolls, jelly roll cake, nutty bar, chocolate milk: Snk ( PM):  D ( PM): Pizza, chocolate milk, bread sticks. Snk ( PM): cinnamon roll and chocolate milk  Beverages: water, diet sodas.  Usual physical activity: ADL  Estimated energy needs: 1500 calories 170 g carbohydrates 112 g protein 42 g fat  Progress Towards Goal(s):  In progress.   Nutritional Diagnosis:  NB-1.1 Food and nutrition-related knowledge deficit As related to DM.  As evidenced by A1C  10.8%.    Intervention:  Nutrition and Diabetes education provided on My Plate, CHO counting, meal planning, portion sizes, timing of meals, avoiding snacks between meals unless having a low blood sugar, target ranges for A1C and blood sugars, signs/symptoms and treatment of hyper/hypoglycemia, monitoring blood sugars, taking medications as prescribed, benefits of exercising 30 minutes per day and prevention of complications of DM.  Goals 1. Follow My Plate MEthod 2.Increase fresh fruits and low carb vegetables. 3. Eat three balanced meals per day 4. 2-3  Carb choices per meal. 5. Cut out diet sodas, cakes, cookies, ice cream 6. Drink only water- w ork on cutting out diet coke. 7. Keep food journal 8. Get A1C down to 9% in three months.  Teaching Method Utilized:  Visual Auditory Hands on  Handouts given during visit include:  The Plate Method  Meal Plan Card  Diabetes Instructions.   Barriers to learning/adherence to lifestyle change: None  Demonstrated degree of understanding via:  Teach Back   Monitoring/Evaluation:  Dietary intake, exercise, meal planning, SBG, and body weight in 1 month.

## 2015-11-23 ENCOUNTER — Ambulatory Visit: Payer: Medicare Other | Admitting: Nutrition

## 2015-12-02 ENCOUNTER — Other Ambulatory Visit: Payer: Self-pay | Admitting: "Endocrinology

## 2015-12-27 ENCOUNTER — Ambulatory Visit: Payer: Medicare Other | Admitting: Nutrition

## 2016-01-02 ENCOUNTER — Other Ambulatory Visit: Payer: Self-pay | Admitting: "Endocrinology

## 2016-01-03 DIAGNOSIS — T84498A Other mechanical complication of other internal orthopedic devices, implants and grafts, initial encounter: Secondary | ICD-10-CM | POA: Insufficient documentation

## 2016-01-26 ENCOUNTER — Other Ambulatory Visit (HOSPITAL_COMMUNITY): Payer: Self-pay | Admitting: Nurse Practitioner

## 2016-01-26 DIAGNOSIS — Z1231 Encounter for screening mammogram for malignant neoplasm of breast: Secondary | ICD-10-CM

## 2016-01-30 DIAGNOSIS — T8141XA Infection following a procedure, superficial incisional surgical site, initial encounter: Secondary | ICD-10-CM | POA: Insufficient documentation

## 2016-01-31 ENCOUNTER — Ambulatory Visit (HOSPITAL_COMMUNITY)
Admission: RE | Admit: 2016-01-31 | Discharge: 2016-01-31 | Disposition: A | Discharge status: Home / Self Care | Payer: Medicare Other | Source: Ambulatory Visit | Attending: Nurse Practitioner | Admitting: Nurse Practitioner

## 2016-01-31 DIAGNOSIS — Z1231 Encounter for screening mammogram for malignant neoplasm of breast: Secondary | ICD-10-CM | POA: Diagnosis not present

## 2016-02-02 ENCOUNTER — Ambulatory Visit: Payer: Medicare Other | Admitting: "Endocrinology

## 2016-02-22 ENCOUNTER — Telehealth: Payer: Self-pay | Admitting: "Endocrinology

## 2016-02-22 ENCOUNTER — Other Ambulatory Visit: Payer: Self-pay

## 2016-02-22 MED ORDER — "INSULIN SYRINGE-NEEDLE U-100 31G X 5/16"" 0.3 ML MISC": Status: DC

## 2016-02-22 MED ORDER — INSULIN GLARGINE 100 UNIT/ML SOLOSTAR PEN: PEN_INJECTOR | SUBCUTANEOUS | Status: DC

## 2016-02-22 NOTE — Telephone Encounter (Signed)
Med refilled.

## 2016-02-22 NOTE — Telephone Encounter (Signed)
she is our of her insulin, please call in a refill

## 2016-02-26 ENCOUNTER — Ambulatory Visit (INDEPENDENT_AMBULATORY_CARE_PROVIDER_SITE_OTHER): Payer: Medicare Other | Admitting: "Endocrinology

## 2016-02-26 ENCOUNTER — Encounter: Payer: Self-pay | Admitting: "Endocrinology

## 2016-02-26 VITALS — BP 146/92 | HR 71 | Ht 68.0 in | Wt 224.0 lb

## 2016-02-26 DIAGNOSIS — IMO0002 Reserved for concepts with insufficient information to code with codable children: Secondary | ICD-10-CM

## 2016-02-26 DIAGNOSIS — E785 Hyperlipidemia, unspecified: Secondary | ICD-10-CM

## 2016-02-26 DIAGNOSIS — Z9119 Patient's noncompliance with other medical treatment and regimen: Secondary | ICD-10-CM | POA: Diagnosis not present

## 2016-02-26 DIAGNOSIS — E1165 Type 2 diabetes mellitus with hyperglycemia: Secondary | ICD-10-CM

## 2016-02-26 DIAGNOSIS — E1151 Type 2 diabetes mellitus with diabetic peripheral angiopathy without gangrene: Secondary | ICD-10-CM | POA: Diagnosis not present

## 2016-02-26 DIAGNOSIS — Z91199 Patient's noncompliance with other medical treatment and regimen due to unspecified reason: Secondary | ICD-10-CM

## 2016-02-26 DIAGNOSIS — I10 Essential (primary) hypertension: Secondary | ICD-10-CM
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
## 2016-02-26 NOTE — Patient Instructions (Signed)
Advice for weight management -For most of us the best way to lose weight is by diet management. Generally speaking, diet management means restricting carbohydrate consumption to minimum possible (and to unprocessed or minimally processed complex starch) and increasing protein intake (animal or plant source), fruits, and vegetables.  -Sticking to a routine mealtime to eat 3 meals a day and avoiding unnecessary snacks is shown to have a big role in weight control.  -It is better to avoid simple carbohydrates including: Cakes, Desserts, Ice Cream, Soda (diet and regular), Sweet Tea, Candies, Chips, Cookies, Artificial Sweeteners, and "Sugar-free" Products.   -Exercise: 30 minutes a day 3-4 days a week, or 150 minutes a week. Combine stretch, strength, and aerobic activities. You may seek evaluation by your heart doctor prior to initiating exercise if you have high risk for heart disease.  -If you are interested, we can schedule a visit with Denny Crumpton, RDN, CDE for individualized nutrition education.  

## 2016-02-27 ENCOUNTER — Other Ambulatory Visit: Payer: Self-pay | Admitting: Obstetrics & Gynecology

## 2016-02-27 ENCOUNTER — Other Ambulatory Visit: Payer: Self-pay | Admitting: "Endocrinology

## 2016-02-29 ENCOUNTER — Encounter: Payer: Self-pay | Admitting: Nutrition

## 2016-02-29 ENCOUNTER — Encounter: Payer: Medicare Other | Attending: "Endocrinology | Admitting: Nutrition

## 2016-02-29 VITALS — Ht 68.0 in | Wt 223.0 lb

## 2016-02-29 DIAGNOSIS — E1151 Type 2 diabetes mellitus with diabetic peripheral angiopathy without gangrene: Secondary | ICD-10-CM | POA: Diagnosis not present

## 2016-02-29 DIAGNOSIS — E118 Type 2 diabetes mellitus with unspecified complications: Secondary | ICD-10-CM

## 2016-02-29 DIAGNOSIS — E669 Obesity, unspecified: Secondary | ICD-10-CM

## 2016-02-29 DIAGNOSIS — IMO0002 Reserved for concepts with insufficient information to code with codable children: Secondary | ICD-10-CM

## 2016-02-29 DIAGNOSIS — E1165 Type 2 diabetes mellitus with hyperglycemia: Secondary | ICD-10-CM | POA: Diagnosis present

## 2016-02-29 DIAGNOSIS — Z794 Long term (current) use of insulin: Secondary | ICD-10-CM

## 2016-02-29 NOTE — Patient Instructions (Signed)
Goals 1. Follow My Plate 2. Eat meals at times 3. Eat 2-3 carb choices per meals 4. Start exercising as tolerated. 5. Lose 1-2 lbs per week 6. Get A1C down to 8% in three months.

## 2016-02-29 NOTE — Progress Notes (Signed)
  Medical Nutrition Therapy:  Appt start time: 0915 end time:  0945 Assessment:  Primary concerns today: Diabetes Type 2 follow up.  Testing 4 times per day.  Most recent A1C 10.8%. Getting surgery done on her leg and elbow and can't get surgery because her blood sugars are too high. Trying to do better with sticking to diet and following meals meal. Brought in food journal and BS log. BS reading for last 3 days 175, 107, 108, 234 and 218. BS improving. She is working on getting her meal times better on schedule. She is on 40 units of Lantus, 15+ units of Novolog with meals, Metformin 1000 mg BID and Trajenta.   Lab Results  Component Value Date   HGBA1C 10.9* 10/30/2015    Preferred Learning Style:    Auditory  Visual  Learning Readiness:  Ready  Change in progress   MEDICATIONS: See list.   DIETARY INTAKE:  24-hr recall:  B ( AM): 1 egg, 1 cup greek yogurt, water Snk ( AM):   L ( PM)6 in sub at [employer], water:  D ( PM): Salad with meatballs, celery stick,  1 cup milk. Snk ( PM):  Beverages: water, .  Usual physical activity: ADL  Estimated energy needs: 1500 calories 170 g carbohydrates 112 g protein 42 g fat  Progress Towards Goal(s):  In progress.   Nutritional Diagnosis:  NB-1.1 Food and nutrition-related knowledge deficit As related to DM.  As evidenced by A1C 10.8%.    Intervention:  Nutrition and Diabetes education provided on My Plate, CHO counting, meal planning, portion sizes, timing of meals, avoiding snacks between meals unless having a low blood sugar, target ranges for A1C and blood sugars, signs/symptoms and treatment of hyper/hypoglycemia, monitoring blood sugars, taking medications as prescribed, benefits of exercising 30 minutes per day and prevention of complications of DM.  Goals Goals 1. Follow My Plate 2. Eat meals at times 3. Eat 2-3 carb choices per meals 4. Start exercising as tolerated. 5. Lose 1-2 lbs per week 6. Get A1C down to 8%  in three months.   1. Teaching Method Utilized:  Visual Auditory Hands on  Handouts given during visit include:  The Plate Method  Meal Plan Card  Diabetes Instructions.   Barriers to learning/adherence to lifestyle change: None  Demonstrated degree of understanding via:  Teach Back   Monitoring/Evaluation:  Dietary intake, exercise, meal planning, SBG, and body weight in 1-2 weeks.

## 2016-03-06 ENCOUNTER — Encounter: Payer: Medicare Other | Attending: "Endocrinology | Admitting: Nutrition

## 2016-03-06 ENCOUNTER — Encounter: Payer: Self-pay | Admitting: "Endocrinology

## 2016-03-06 ENCOUNTER — Ambulatory Visit (INDEPENDENT_AMBULATORY_CARE_PROVIDER_SITE_OTHER): Payer: Medicare Other | Admitting: "Endocrinology

## 2016-03-06 VITALS — BP 126/82 | HR 82 | Ht 68.0 in | Wt 224.0 lb

## 2016-03-06 VITALS — Ht 68.0 in | Wt 224.0 lb

## 2016-03-06 DIAGNOSIS — E1151 Type 2 diabetes mellitus with diabetic peripheral angiopathy without gangrene: Secondary | ICD-10-CM | POA: Insufficient documentation

## 2016-03-06 DIAGNOSIS — E669 Obesity, unspecified: Secondary | ICD-10-CM

## 2016-03-06 DIAGNOSIS — E785 Hyperlipidemia, unspecified: Secondary | ICD-10-CM

## 2016-03-06 DIAGNOSIS — E1165 Type 2 diabetes mellitus with hyperglycemia: Secondary | ICD-10-CM | POA: Diagnosis not present

## 2016-03-06 DIAGNOSIS — I10 Essential (primary) hypertension: Secondary | ICD-10-CM | POA: Diagnosis not present

## 2016-03-06 DIAGNOSIS — Z91199 Patient's noncompliance with other medical treatment and regimen due to unspecified reason: Secondary | ICD-10-CM

## 2016-03-06 DIAGNOSIS — IMO0002 Reserved for concepts with insufficient information to code with codable children: Secondary | ICD-10-CM

## 2016-03-06 DIAGNOSIS — E118 Type 2 diabetes mellitus with unspecified complications: Secondary | ICD-10-CM

## 2016-03-06 DIAGNOSIS — Z9119 Patient's noncompliance with other medical treatment and regimen: Secondary | ICD-10-CM

## 2016-03-06 DIAGNOSIS — Z794 Long term (current) use of insulin: Secondary | ICD-10-CM

## 2016-03-06 NOTE — Patient Instructions (Signed)
Advice for weight management -For most of us the best way to lose weight is by diet management. Generally speaking, diet management means restricting carbohydrate consumption to minimum possible (and to unprocessed or minimally processed complex starch) and increasing protein intake (animal or plant source), fruits, and vegetables.  -Sticking to a routine mealtime to eat 3 meals a day and avoiding unnecessary snacks is shown to have a big role in weight control.  -It is better to avoid simple carbohydrates including: Cakes, Desserts, Ice Cream, Soda (diet and regular), Sweet Tea, Candies, Chips, Cookies, Artificial Sweeteners, and "Sugar-free" Products.   -Exercise: 30 minutes a day 3-4 days a week, or 150 minutes a week. Combine stretch, strength, and aerobic activities. You may seek evaluation by your heart doctor prior to initiating exercise if you have high risk for heart disease.  -If you are interested, we can schedule a visit with Kayona Crumpton, RDN, CDE for individualized nutrition education.  
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
## 2016-03-06 NOTE — Patient Instructions (Signed)
Goals 1. Cut out late night eating 2. Exercise 15 minutes daily 3. Eat meals on time and not after 7 pm.

## 2016-03-06 NOTE — Progress Notes (Signed)
  Medical Nutrition Therapy:  Appt start time: 100 end time:  1030 Assessment:  Primary concerns today: Diabetes Type 2 follow up. Saw Dr. Dorris Fetch today.  Testing 4 times per day.  Most recent A1C 10.8%. Getting surgery done on her leg and elbow and can't get surgery because her blood sugars are too high. Trying to do better with sticking to diet and following meals meal. Brought in food journal and BS log. BS reading for last 3 days 175, 107, 108, 234 and 218. BS improving. She is working on getting her meal times better on schedule. She is on 40 units of Lantus, 18+ units of Novolog with meals, Metformin 1000 mg BID and Trajenta. She is working in eating meals on time and eating better quality of foods.  .  Lab Results  Component Value Date   HGBA1C 10.9* 10/30/2015    Preferred Learning Style:    Auditory  Visual  Learning Readiness:  Ready  Change in progress   MEDICATIONS: See list.   DIETARY INTAKE:  24-hr recall:  B ( AM): Celery, PB, coffee. Snk ( AM):   L ( PM) Grilled chicken, pepper, onions, beets, broccli casserole. D ( PM): Salad with meatballs, celery stick,  1 cup milk. Snk ( PM):  Beverages: water, .  Usual physical activity: ADL  Estimated energy needs: 1500 calories 170 g carbohydrates 112 g protein 42 g fat  Progress Towards Goal(s):  In progress.   Nutritional Diagnosis:  NB-1.1 Food and nutrition-related knowledge deficit As related to DM.  As evidenced by A1C 10.8%.    Intervention:  Nutrition and Diabetes education provided on My Plate, CHO counting, meal planning, portion sizes, timing of meals, avoiding snacks between meals unless having a low blood sugar, target ranges for A1C and blood sugars, signs/symptoms and treatment of hyper/hypoglycemia, monitoring blood sugars, taking medications as prescribed, benefits of exercising 30 minutes per day and prevention of complications of DM.  Goals  Goals 1. Cut out late night eating 2. Exercise  15 minutes daily 3. Eat meals on time and not after 7 pm. 1. Teaching Method Utilized:  Visual Auditory Hands on  Handouts given during visit include:  The Plate Method  Meal Plan Card  Diabetes Instructions.   Barriers to learning/adherence to lifestyle change: None  Demonstrated degree of understanding via:  Teach Back   Monitoring/Evaluation:  Dietary intake, exercise, meal planning, SBG, and body weight in 1-3 mos.[name]

## 2016-03-21 ENCOUNTER — Ambulatory Visit: Payer: Medicare Other | Admitting: "Endocrinology

## 2016-03-26 ENCOUNTER — Ambulatory Visit: Payer: Medicare Other | Admitting: Orthopedic Surgery

## 2016-03-29 ENCOUNTER — Ambulatory Visit: Payer: Medicare Other | Admitting: "Endocrinology

## 2016-04-01 ENCOUNTER — Other Ambulatory Visit: Payer: Self-pay | Admitting: "Endocrinology

## 2016-04-02 ENCOUNTER — Ambulatory Visit (INDEPENDENT_AMBULATORY_CARE_PROVIDER_SITE_OTHER): Payer: Medicare Other | Admitting: Orthopedic Surgery

## 2016-04-02 VITALS — BP 123/81 | HR 95 | Ht 68.0 in | Wt 229.4 lb

## 2016-04-02 DIAGNOSIS — G5603 Carpal tunnel syndrome, bilateral upper limbs: Secondary | ICD-10-CM | POA: Diagnosis not present
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
## 2016-04-03 ENCOUNTER — Telehealth: Payer: Self-pay | Admitting: *Deleted

## 2016-04-03 ENCOUNTER — Other Ambulatory Visit: Payer: Self-pay | Admitting: "Endocrinology

## 2016-04-03 NOTE — Telephone Encounter (Signed)
No pre-cert required for [employer] [facility](scheduled for 04/25/16) CPT 64721 from [employer] per [place].. Also, no pre-cert required for [place] Medicaid per online.

## 2016-04-08 ENCOUNTER — Ambulatory Visit: Payer: Medicare Other | Admitting: Nutrition

## 2016-04-16 ENCOUNTER — Ambulatory Visit (INDEPENDENT_AMBULATORY_CARE_PROVIDER_SITE_OTHER): Payer: Medicare Other | Admitting: "Endocrinology

## 2016-04-16 ENCOUNTER — Encounter: Payer: Self-pay | Admitting: "Endocrinology

## 2016-04-16 VITALS — BP 109/80 | HR 74 | Ht 68.0 in | Wt 232.0 lb

## 2016-04-16 DIAGNOSIS — E1151 Type 2 diabetes mellitus with diabetic peripheral angiopathy without gangrene: Secondary | ICD-10-CM

## 2016-04-16 DIAGNOSIS — Z9119 Patient's noncompliance with other medical treatment and regimen: Secondary | ICD-10-CM

## 2016-04-16 DIAGNOSIS — I10 Essential (primary) hypertension: Secondary | ICD-10-CM

## 2016-04-16 DIAGNOSIS — E1165 Type 2 diabetes mellitus with hyperglycemia: Secondary | ICD-10-CM

## 2016-04-16 DIAGNOSIS — IMO0002 Reserved for concepts with insufficient information to code with codable children: Secondary | ICD-10-CM

## 2016-04-16 DIAGNOSIS — E785 Hyperlipidemia, unspecified: Secondary | ICD-10-CM

## 2016-04-16 DIAGNOSIS — Z91199 Patient's noncompliance with other medical treatment and regimen due to unspecified reason: Secondary | ICD-10-CM

## 2016-04-16 MED ORDER — INSULIN LISPRO 100 UNIT/ML ~~LOC~~ SOLN: 20.0000 [IU] | Freq: Three times a day (TID) | SUBCUTANEOUS | Status: DC

## 2016-04-16 MED ORDER — CANAGLIFLOZIN 100 MG PO TABS: 100.0000 mg | ORAL_TABLET | Freq: Every day | ORAL | Status: DC
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
## 2016-04-18 ENCOUNTER — Other Ambulatory Visit: Payer: Self-pay

## 2016-04-18 MED ORDER — INSULIN GLARGINE 100 UNIT/ML SOLOSTAR PEN: 60.0000 [IU] | PEN_INJECTOR | Freq: Every day | SUBCUTANEOUS | Status: DC

## 2016-04-18 NOTE — Patient Instructions (Signed)
Yolanda [name]  04/18/2016     @PREFPERIOPPHARMACY @   Your procedure is scheduled on  04/25/2016   Report to [facility] at  730 A.M.  Call this number if you have problems the morning of surgery:  [phone]   Remember:  Do not eat food or drink liquids after midnight.  Take these medicines the morning of surgery with A SIP OF WATER  Atenolol, zyrtec, valium, cymbalta, neurontin, lisinopril, oxycodone, paxil. Tale 1/2 of your usual insulin dosage the night before your surgery. DO NOT take any medications for your diabetes the morning of your surgery.   Do not wear jewelry, make-up or nail polish.  Do not wear lotions, powders, or perfumes.  You may wear deodorant.  Do not shave 48 hours prior to surgery.  Men may shave face and neck.  Do not bring valuables to the hospital.  [facility] is not responsible for any belongings or valuables.  Contacts, dentures or bridgework may not be worn into surgery.  Leave your suitcase in the car.  After surgery it may be brought to your room.  For patients admitted to the hospital, discharge time will be determined by your treatment team.  Patients discharged the day of surgery will not be allowed to drive home.   Name and phone number of your driver:   [phone] Special instructions:  none  Please read over the following fact sheets that you were given. Coughing and Deep Breathing, Surgical Site Infection Prevention, Anesthesia Post-op Instructions and Care and Recovery After Surgery      Carpal Tunnel Release Carpal tunnel release is a surgical procedure to relieve numbness and pain in your hand that are caused by carpal tunnel syndrome. Your carpal tunnel is a narrow, hollow space in your wrist. It passes between your wrist bones and a band of connective tissue (transverse carpal ligament). The nerve that supplies most of your hand (median nerve) passes through this space, and so do the connections between your fingers and  the muscles of your arm (tendons). Carpal tunnel syndrome makes this space swell and become narrow, and this causes pain and numbness. In carpal tunnel release surgery, a surgeon cuts through the transverse carpal ligament to make more room in the carpal tunnel space. You may have this surgery if other types of treatment have not worked. LET [facility] CARE PROVIDER KNOW ABOUT:  Any allergies you have.  All medicines you are taking, including vitamins, herbs, eye drops, creams, and over-the-counter medicines.  Previous problems you or members of your family have had with the use of anesthetics.  Any blood disorders you have.  Previous surgeries you have had.  Medical conditions you have. RISKS AND COMPLICATIONS Generally, this is a safe procedure. However, problems may occur, including:  Bleeding.  Infection.  Injury to the median nerve.  Need for additional surgery. BEFORE THE PROCEDURE  Ask your health care provider about:  Changing or stopping your regular medicines. This is especially important if you are taking diabetes medicines or blood thinners.  Taking medicines such as aspirin and ibuprofen. These medicines can thin your blood. Do not take these medicines before your procedure if your health care provider instructs you not to.  Do not eat or drink anything after midnight on the night before the procedure or as directed by your health care provider.  Plan to have someone take you home after the procedure. PROCEDURE  An IV tube may be  inserted into a vein.  You will be given one of the following:  A medicine that numbs the wrist area (local anesthetic). You may also be given a medicine to make you relax (sedative).  A medicine that makes you go to sleep (general anesthetic).  Your arm, hand, and wrist will be cleaned with a germ-killing solution (antiseptic).  Your surgeon will make a surgical cut (incision) over the palm side of your wrist. The surgeon will  pull aside the skin of your wrist to expose the carpal tunnel space.  The surgeon will cut the transverse carpal ligament.  The edges of the incision will be closed with stitches (sutures) or staples.  A bandage (dressing) will be placed over your wrist and wrapped around your hand and wrist. AFTER THE PROCEDURE  You may spend some time in a recovery area.  Your blood pressure, heart rate, breathing rate, and blood oxygen level will be monitored often until the medicines you were given have worn off.  You will likely have some pain. You will be given pain medicine.  You may need to wear a splint or a wrist brace over your dressing.   This information is not intended to replace advice given to you by your health care provider. Make sure you discuss any questions you have with your health care provider.   Document Released: 02/08/2004 Document Revised: 12/09/2014 Document Reviewed: 07/06/2014 Elsevier Interactive Patient Education 2016 [place] After Refer to this sheet in the next few weeks. These instructions provide you with information about caring for yourself after your procedure. Your health care provider may also give you more specific instructions. Your treatment has been planned according to current medical practices, but problems sometimes occur. Call your health care provider if you have any problems or questions after your procedure. WHAT TO EXPECT AFTER THE PROCEDURE After your procedure, it is typical to have the following:  Pain.  Numbness.  Tingling.  Swelling.  Stiffness.  Bruising. HOME CARE INSTRUCTIONS  Take medicines only as directed by your health care provider.  There are many different ways to close and cover an incision, including stitches (sutures), skin glue, and adhesive strips. Follow your health care provider's instructions about:  Incision care.  Bandage (dressing) changes and removal.  Incision closure  removal.  Wear a splint or a brace as directed by your surgeon. You may need to do this for 2-3 weeks.  Keep your hand raised (elevated) above the level of your heart while you are resting. Move your fingers often.  Avoid activities that cause hand pain.  Ask your surgeon when you can start to do all of your usual activities again, such as:  Driving.  Returning to work.  Bathing and swimming.  Keep all follow-up visits as directed by your health care provider. This is important. You may need physical therapy for several months to speed healing and regain movement. SEEK MEDICAL CARE IF:  You have drainage, redness, swelling, or pain at your incision site.  You have a fever.  You have chills.  Your pain medicine is not working.  Your symptoms do not go away after 2 months.  Your symptoms go away and then return. SEEK IMMEDIATE MEDICAL CARE IF:  You have pain or numbness that is getting worse.  Your fingers change color.  You are not able to move your fingers.   This information is not intended to replace advice given to you by your health care provider.  Make sure you discuss any questions you have with your health care provider.   Document Released: 06/07/2005 Document Revised: 12/09/2014 Document Reviewed: 07/06/2014 Elsevier Interactive Patient Education 2016 [employer] PATIENT INSTRUCTIONS POST-ANESTHESIA  IMMEDIATELY FOLLOWING SURGERY:  Do not drive or operate machinery for the first twenty four hours after surgery.  Do not make any important decisions for twenty four hours after surgery or while taking narcotic pain medications or sedatives.  If you develop intractable nausea and vomiting or a severe headache please notify your doctor immediately.  FOLLOW-UP:  Please make an appointment with your surgeon as instructed. You do not need to follow up with anesthesia unless specifically instructed to do so.  WOUND CARE INSTRUCTIONS (if applicable):  Keep a dry  clean dressing on the anesthesia/puncture wound site if there is drainage.  Once the wound has quit draining you may leave it open to air.  Generally you should leave the bandage intact for twenty four hours unless there is drainage.  If the epidural site drains for more than 36-48 hours please call the anesthesia department.  QUESTIONS?:  Please feel free to call your physician or the hospital operator if you have any questions, and they will be happy to assist you.

## 2016-04-22 ENCOUNTER — Encounter (HOSPITAL_COMMUNITY)
Admission: RE | Admit: 2016-04-22 | Discharge: 2016-04-22 | Disposition: A | Discharge status: Home / Self Care | Payer: Medicare Other | Source: Ambulatory Visit | Attending: Orthopedic Surgery | Admitting: Orthopedic Surgery

## 2016-04-23 ENCOUNTER — Other Ambulatory Visit: Payer: Self-pay

## 2016-04-23 ENCOUNTER — Encounter (HOSPITAL_COMMUNITY): Payer: Self-pay

## 2016-04-23 ENCOUNTER — Encounter (HOSPITAL_COMMUNITY)
Admission: RE | Admit: 2016-04-23 | Discharge: 2016-04-23 | Disposition: A | Discharge status: Home / Self Care | Payer: Medicare Other | Source: Ambulatory Visit | Attending: Orthopedic Surgery | Admitting: Orthopedic Surgery

## 2016-04-23 DIAGNOSIS — F418 Other specified anxiety disorders: Secondary | ICD-10-CM | POA: Diagnosis not present

## 2016-04-23 DIAGNOSIS — Z794 Long term (current) use of insulin: Secondary | ICD-10-CM | POA: Diagnosis not present

## 2016-04-23 DIAGNOSIS — M1991 Primary osteoarthritis, unspecified site: Secondary | ICD-10-CM | POA: Diagnosis not present

## 2016-04-23 DIAGNOSIS — F603 Borderline personality disorder: Secondary | ICD-10-CM | POA: Diagnosis not present

## 2016-04-23 DIAGNOSIS — F410 Panic disorder [episodic paroxysmal anxiety] without agoraphobia: Secondary | ICD-10-CM | POA: Diagnosis not present

## 2016-04-23 DIAGNOSIS — Z79899 Other long term (current) drug therapy: Secondary | ICD-10-CM | POA: Diagnosis not present

## 2016-04-23 DIAGNOSIS — E119 Type 2 diabetes mellitus without complications: Secondary | ICD-10-CM | POA: Diagnosis not present

## 2016-04-23 DIAGNOSIS — Z01810 Encounter for preprocedural cardiovascular examination: Secondary | ICD-10-CM | POA: Diagnosis not present

## 2016-04-23 DIAGNOSIS — K219 Gastro-esophageal reflux disease without esophagitis: Secondary | ICD-10-CM | POA: Diagnosis not present

## 2016-04-23 DIAGNOSIS — G5601 Carpal tunnel syndrome, right upper limb: Secondary | ICD-10-CM | POA: Diagnosis not present

## 2016-04-23 DIAGNOSIS — E669 Obesity, unspecified: Secondary | ICD-10-CM | POA: Diagnosis not present

## 2016-04-23 DIAGNOSIS — Z6835 Body mass index (BMI) 35.0-35.9, adult: Secondary | ICD-10-CM | POA: Diagnosis not present

## 2016-04-23 DIAGNOSIS — E785 Hyperlipidemia, unspecified: Secondary | ICD-10-CM | POA: Diagnosis not present

## 2016-04-23 DIAGNOSIS — Z7982 Long term (current) use of aspirin: Secondary | ICD-10-CM | POA: Diagnosis not present

## 2016-04-23 DIAGNOSIS — G4730 Sleep apnea, unspecified: Secondary | ICD-10-CM | POA: Diagnosis not present

## 2016-04-23 DIAGNOSIS — I10 Essential (primary) hypertension: Secondary | ICD-10-CM | POA: Diagnosis not present

## 2016-04-23 DIAGNOSIS — Z01812 Encounter for preprocedural laboratory examination: Secondary | ICD-10-CM | POA: Diagnosis not present

## 2016-04-23 DIAGNOSIS — F319 Bipolar disorder, unspecified: Secondary | ICD-10-CM | POA: Diagnosis not present

## 2016-04-23 HISTORY — DX: Other intervertebral disc degeneration, lumbar region without mention of lumbar back pain or lower extremity pain: M51.369

## 2016-04-23 HISTORY — DX: Other intervertebral disc degeneration, lumbar region: M51.36

## 2016-04-23 LAB — CBC WITH DIFFERENTIAL/PLATELET
BASOS ABS: 0 10*3/uL (ref 0.0–0.1)
Basophils Relative: 0 %
EOS ABS: 0.2 10*3/uL (ref 0.0–0.7)
Eosinophils Relative: 2 %
HCT: 42.3 % (ref 36.0–46.0)
HEMOGLOBIN: 14 g/dL (ref 12.0–15.0)
LYMPHS ABS: 4.5 10*3/uL — AB (ref 0.7–4.0)
Lymphocytes Relative: 45 %
MCH: 27.3 pg (ref 26.0–34.0)
MCHC: 33.1 g/dL (ref 30.0–36.0)
MCV: 82.6 fL (ref 78.0–100.0)
Monocytes Absolute: 0.5 10*3/uL (ref 0.1–1.0)
Monocytes Relative: 5 %
NEUTROS PCT: 48 %
Neutro Abs: 4.7 10*3/uL (ref 1.7–7.7)
PLATELETS: 325 10*3/uL (ref 150–400)
RBC: 5.12 MIL/uL — AB (ref 3.87–5.11)
RDW: 14.5 % (ref 11.5–15.5)
WBC: 10 10*3/uL (ref 4.0–10.5)

## 2016-04-23 LAB — BASIC METABOLIC PANEL
Anion gap: 7 (ref 5–15)
BUN: 14 mg/dL (ref 6–20)
CHLORIDE: 98 mmol/L — AB (ref 101–111)
CO2: 27 mmol/L (ref 22–32)
Calcium: 10 mg/dL (ref 8.9–10.3)
Creatinine, Ser: 0.68 mg/dL (ref 0.44–1.00)
Glucose, Bld: 300 mg/dL — ABNORMAL HIGH (ref 65–99)
POTASSIUM: 4.5 mmol/L (ref 3.5–5.1)
SODIUM: 132 mmol/L — AB (ref 135–145)

## 2016-04-23 NOTE — Pre-Procedure Instructions (Signed)
Patient given information to sign up for my chart at home. 
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
## 2016-04-25 ENCOUNTER — Encounter (HOSPITAL_COMMUNITY): Payer: Self-pay | Admitting: *Deleted

## 2016-04-25 ENCOUNTER — Encounter (HOSPITAL_COMMUNITY)
Admission: RE | Disposition: A | Discharge status: Home / Self Care | Payer: Self-pay | Source: Ambulatory Visit | Attending: Orthopedic Surgery

## 2016-04-25 ENCOUNTER — Ambulatory Visit (HOSPITAL_COMMUNITY): Payer: Medicare Other | Admitting: Anesthesiology

## 2016-04-25 ENCOUNTER — Ambulatory Visit (HOSPITAL_COMMUNITY)
Admission: RE | Admit: 2016-04-25 | Discharge: 2016-04-25 | Disposition: A | Discharge status: Home / Self Care | Payer: Medicare Other | Source: Ambulatory Visit | Attending: Orthopedic Surgery | Admitting: Orthopedic Surgery

## 2016-04-25 DIAGNOSIS — F418 Other specified anxiety disorders: Secondary | ICD-10-CM | POA: Insufficient documentation

## 2016-04-25 DIAGNOSIS — E119 Type 2 diabetes mellitus without complications: Secondary | ICD-10-CM | POA: Insufficient documentation

## 2016-04-25 DIAGNOSIS — Z01812 Encounter for preprocedural laboratory examination: Secondary | ICD-10-CM | POA: Insufficient documentation

## 2016-04-25 DIAGNOSIS — Z79899 Other long term (current) drug therapy: Secondary | ICD-10-CM | POA: Insufficient documentation

## 2016-04-25 DIAGNOSIS — F410 Panic disorder [episodic paroxysmal anxiety] without agoraphobia: Secondary | ICD-10-CM | POA: Insufficient documentation

## 2016-04-25 DIAGNOSIS — F603 Borderline personality disorder: Secondary | ICD-10-CM | POA: Insufficient documentation

## 2016-04-25 DIAGNOSIS — G5601 Carpal tunnel syndrome, right upper limb: Secondary | ICD-10-CM | POA: Insufficient documentation

## 2016-04-25 DIAGNOSIS — M1991 Primary osteoarthritis, unspecified site: Secondary | ICD-10-CM | POA: Insufficient documentation

## 2016-04-25 DIAGNOSIS — Z01810 Encounter for preprocedural cardiovascular examination: Secondary | ICD-10-CM | POA: Insufficient documentation

## 2016-04-25 DIAGNOSIS — E669 Obesity, unspecified: Secondary | ICD-10-CM | POA: Insufficient documentation

## 2016-04-25 DIAGNOSIS — G4730 Sleep apnea, unspecified: Secondary | ICD-10-CM | POA: Insufficient documentation

## 2016-04-25 DIAGNOSIS — G5603 Carpal tunnel syndrome, bilateral upper limbs: Secondary | ICD-10-CM | POA: Insufficient documentation

## 2016-04-25 DIAGNOSIS — Z794 Long term (current) use of insulin: Secondary | ICD-10-CM | POA: Insufficient documentation

## 2016-04-25 DIAGNOSIS — I10 Essential (primary) hypertension: Secondary | ICD-10-CM | POA: Insufficient documentation

## 2016-04-25 DIAGNOSIS — Z7982 Long term (current) use of aspirin: Secondary | ICD-10-CM | POA: Insufficient documentation

## 2016-04-25 DIAGNOSIS — F319 Bipolar disorder, unspecified: Secondary | ICD-10-CM | POA: Insufficient documentation

## 2016-04-25 DIAGNOSIS — Z6835 Body mass index (BMI) 35.0-35.9, adult: Secondary | ICD-10-CM | POA: Insufficient documentation

## 2016-04-25 DIAGNOSIS — E785 Hyperlipidemia, unspecified: Secondary | ICD-10-CM | POA: Insufficient documentation

## 2016-04-25 DIAGNOSIS — K219 Gastro-esophageal reflux disease without esophagitis: Secondary | ICD-10-CM | POA: Insufficient documentation

## 2016-04-25 HISTORY — PX: CARPAL TUNNEL RELEASE: SHX101

## 2016-04-25 LAB — GLUCOSE, CAPILLARY
GLUCOSE-CAPILLARY: 301 mg/dL — AB (ref 65–99)
GLUCOSE-CAPILLARY: 281 mg/dL — AB (ref 65–99)

## 2016-04-25 SURGERY — CARPAL TUNNEL RELEASE
Anesthesia: Regional | Site: Hand | Laterality: Right

## 2016-04-25 MED ORDER — MIDAZOLAM HCL 2 MG/2ML IJ SOLN
1.0000 mg | INTRAMUSCULAR | Status: DC | PRN
  Administered 2016-04-25 (×2): 2 mg via INTRAVENOUS
  Filled 2016-04-25: qty 2

## 2016-04-25 MED ORDER — LIDOCAINE HCL (PF) 0.5 % IJ SOLN
INTRAMUSCULAR | Status: DC | PRN
  Administered 2016-04-25: 250 mg via INTRAVENOUS

## 2016-04-25 MED ORDER — LIDOCAINE HCL (PF) 0.5 % IJ SOLN
INTRAMUSCULAR | Status: AC
  Filled 2016-04-25: qty 50

## 2016-04-25 MED ORDER — FENTANYL CITRATE (PF) 100 MCG/2ML IJ SOLN
INTRAMUSCULAR | Status: DC | PRN
  Administered 2016-04-25: 50 ug via INTRAVENOUS
  Administered 2016-04-25 (×2): 25 ug via INTRAVENOUS

## 2016-04-25 MED ORDER — FENTANYL CITRATE (PF) 100 MCG/2ML IJ SOLN
INTRAMUSCULAR | Status: AC
  Filled 2016-04-25: qty 2

## 2016-04-25 MED ORDER — SODIUM CHLORIDE 0.9% FLUSH
INTRAVENOUS | Status: AC
  Filled 2016-04-25: qty 10

## 2016-04-25 MED ORDER — BUPIVACAINE HCL (PF) 0.5 % IJ SOLN
INTRAMUSCULAR | Status: DC | PRN
  Administered 2016-04-25: 10 mL

## 2016-04-25 MED ORDER — ONDANSETRON HCL 4 MG/2ML IJ SOLN: 4.0000 mg | Freq: Once | INTRAMUSCULAR | Status: DC | PRN

## 2016-04-25 MED ORDER — OXYCODONE HCL 15 MG PO TABS: 15.0000 mg | ORAL_TABLET | Freq: Two times a day (BID) | ORAL | Status: DC | PRN

## 2016-04-25 MED ORDER — PROPOFOL 500 MG/50ML IV EMUL
INTRAVENOUS | Status: DC | PRN
  Administered 2016-04-25: 35 ug/kg/min via INTRAVENOUS

## 2016-04-25 MED ORDER — LACTATED RINGERS IV SOLN
INTRAVENOUS | Status: DC
  Administered 2016-04-25: 09:00:00 via INTRAVENOUS

## 2016-04-25 MED ORDER — FENTANYL CITRATE (PF) 100 MCG/2ML IJ SOLN: 25.0000 ug | INTRAMUSCULAR | Status: DC | PRN

## 2016-04-25 MED ORDER — CHLORHEXIDINE GLUCONATE 4 % EX LIQD: 60.0000 mL | Freq: Once | CUTANEOUS | Status: DC

## 2016-04-25 MED ORDER — MIDAZOLAM HCL 2 MG/2ML IJ SOLN
INTRAMUSCULAR | Status: AC
  Filled 2016-04-25: qty 2

## 2016-04-25 MED ORDER — FENTANYL CITRATE (PF) 100 MCG/2ML IJ SOLN
25.0000 ug | INTRAMUSCULAR | Status: AC
  Administered 2016-04-25 (×2): 25 ug via INTRAVENOUS

## 2016-04-25 MED ORDER — MIDAZOLAM HCL 5 MG/5ML IJ SOLN
INTRAMUSCULAR | Status: DC | PRN
  Administered 2016-04-25 (×2): 1 mg via INTRAVENOUS

## 2016-04-25 MED ORDER — 0.9 % SODIUM CHLORIDE (POUR BTL) OPTIME
TOPICAL | Status: DC | PRN
Start: 2016-04-25 — End: 2016-04-25
  Administered 2016-04-25: 1000 mL

## 2016-04-25 MED ORDER — CEFAZOLIN SODIUM-DEXTROSE 2-4 GM/100ML-% IV SOLN
2.0000 g | INTRAVENOUS | Status: AC
  Administered 2016-04-25: 2 g via INTRAVENOUS
  Filled 2016-04-25: qty 100

## 2016-04-25 MED ORDER — BUPIVACAINE HCL (PF) 0.5 % IJ SOLN
INTRAMUSCULAR | Status: AC
Start: 2016-04-25 — End: 2016-04-25
  Filled 2016-04-25: qty 30

## 2016-04-25 MED ORDER — PROPOFOL 10 MG/ML IV BOLUS
INTRAVENOUS | Status: AC
Start: 2016-04-25 — End: 2016-04-25
  Filled 2016-04-25: qty 20

## 2016-04-25 MED ORDER — PROPOFOL 10 MG/ML IV BOLUS
INTRAVENOUS | Status: AC
  Filled 2016-04-25: qty 20

## 2016-04-25 SURGICAL SUPPLY — 44 items
BAG HAMPER (MISCELLANEOUS) ×2 IMPLANT
BANDAGE ELASTIC 3 LF NS (GAUZE/BANDAGES/DRESSINGS) ×1 IMPLANT
BANDAGE ELASTIC 3 VELCRO NS (GAUZE/BANDAGES/DRESSINGS) ×2 IMPLANT
BANDAGE ESMARK 4X12 BL STRL LF (DISPOSABLE) ×1 IMPLANT
BLADE SURG 15 STRL LF DISP TIS (BLADE) ×1 IMPLANT
BLADE SURG 15 STRL SS (BLADE) ×2
BNDG CMPR 12X4 ELC STRL LF (DISPOSABLE) ×1
BNDG CMPR MED 5X3 ELC HKLP NS (GAUZE/BANDAGES/DRESSINGS) ×1
BNDG COHESIVE 4X5 TAN STRL (GAUZE/BANDAGES/DRESSINGS) ×2 IMPLANT
BNDG ESMARK 4X12 BLUE STRL LF (DISPOSABLE) ×2
BNDG GAUZE ELAST 4 BULKY (GAUZE/BANDAGES/DRESSINGS) ×1 IMPLANT
CHLORAPREP W/TINT 26ML (MISCELLANEOUS) ×2 IMPLANT
CLOTH BEACON ORANGE TIMEOUT ST (SAFETY) ×2 IMPLANT
COVER LIGHT HANDLE STERIS (MISCELLANEOUS) ×4 IMPLANT
CUFF TOURNIQUET SINGLE 18IN (TOURNIQUET CUFF) ×2 IMPLANT
DECANTER SPIKE VIAL GLASS SM (MISCELLANEOUS) ×2 IMPLANT
DRAPE PROXIMA HALF (DRAPES) ×2 IMPLANT
DRSG XEROFORM 1X8 (GAUZE/BANDAGES/DRESSINGS) ×1 IMPLANT
ELECT NDL TIP 2.8 STRL (NEEDLE) ×1 IMPLANT
ELECT NEEDLE TIP 2.8 STRL (NEEDLE) ×2 IMPLANT
ELECT REM PT RETURN 9FT ADLT (ELECTROSURGICAL) ×2
ELECTRODE REM PT RTRN 9FT ADLT (ELECTROSURGICAL) ×1 IMPLANT
GAUZE SPONGE 4X4 12PLY STRL (GAUZE/BANDAGES/DRESSINGS) ×1 IMPLANT
GLOVE BIO SURGEON STRL SZ7 (GLOVE) ×1 IMPLANT
GLOVE BIOGEL PI IND STRL 7.0 (GLOVE) ×1 IMPLANT
GLOVE BIOGEL PI INDICATOR 7.0 (GLOVE) ×1
GLOVE EXAM NITRILE MD LF STRL (GLOVE) ×2 IMPLANT
GLOVE SKINSENSE NS SZ8.0 LF (GLOVE) ×1
GLOVE SKINSENSE STRL SZ8.0 LF (GLOVE) ×1 IMPLANT
GLOVE SS N UNI LF 8.5 STRL (GLOVE) ×2 IMPLANT
GOWN STRL REUS W/ TWL LRG LVL3 (GOWN DISPOSABLE) ×1 IMPLANT
GOWN STRL REUS W/TWL LRG LVL3 (GOWN DISPOSABLE) ×2
GOWN STRL REUS W/TWL XL LVL3 (GOWN DISPOSABLE) ×2 IMPLANT
HAND ALUMI XLG (SOFTGOODS) ×2 IMPLANT
KIT ROOM TURNOVER APOR (KITS) ×2 IMPLANT
MANIFOLD NEPTUNE II (INSTRUMENTS) ×2 IMPLANT
NDL HYPO 21X1.5 SAFETY (NEEDLE) ×1 IMPLANT
NEEDLE HYPO 21X1.5 SAFETY (NEEDLE) ×2 IMPLANT
NS IRRIG 1000ML POUR BTL (IV SOLUTION) ×2 IMPLANT
PACK BASIC LIMB (CUSTOM PROCEDURE TRAY) ×2 IMPLANT
PAD ARMBOARD 7.5X6 YLW CONV (MISCELLANEOUS) ×2 IMPLANT
SET BASIN LINEN APH (SET/KITS/TRAYS/PACK) ×2 IMPLANT
SUT ETHILON 3 0 FSL (SUTURE) ×2 IMPLANT
SYR CONTROL 10ML LL (SYRINGE) ×2 IMPLANT

## 2016-04-25 NOTE — Interval H&P Note (Signed)
History and Physical Interval Note:  04/25/2016 9:11 AM  Yolanda [name]  has presented today for surgery, with the diagnosis of right carpal tunnel  The various methods of treatment have been discussed with the patient and family. After consideration of risks, benefits and other options for treatment, the patient has consented to  Procedure(s): CARPAL TUNNEL RELEASE (Right) as a surgical intervention .  The patient's history has been reviewed, patient examined, no change in status, stable for surgery.  I have reviewed the patient's chart and labs.  Questions were answered to the patient's satisfaction.     Arther Abbott

## 2016-04-25 NOTE — Transfer of Care (Signed)
Immediate Anesthesia Transfer of Care Note  Patient: Yolanda [name]  Procedure(s) Performed: Procedure(s): CARPAL TUNNEL RELEASE (Right)  Patient Location: PACU  Anesthesia Type:Bier block  Level of Consciousness: awake and patient cooperative  Airway & Oxygen Therapy: Patient Spontanous Breathing and Patient connected to face mask oxygen  Post-op Assessment: Report given to RN, Post -op Vital signs reviewed and stable and Patient moving all extremities  Post vital signs: Reviewed and stable  Last Vitals:  Filed Vitals:   04/25/16 0910 04/25/16 0920  BP: 108/75 113/77  Pulse:  94  Temp:  36.6 C  Resp: 13 14    Last Pain: There were no vitals filed for this visit.    Patients Stated Pain Goal: 5 (26/83/41 9622)  Complications: No apparent anesthesia complications

## 2016-04-25 NOTE — Brief Op Note (Signed)
04/25/2016  10:06 AM  PATIENT:  Yolanda [name]  45 y.o. female  PRE-OPERATIVE DIAGNOSIS:  right carpal tunnel  POST-OPERATIVE DIAGNOSIS:  right carpal tunnel  PROCEDURE:  Procedure(s): CARPAL TUNNEL RELEASE (Right)  SURGEON:  Surgeon(s) and Role:    * [name], MD - Primary Anesthesia Bier block  No blood loss  No drains  10 mL plain half percent Marcaine injected  No specimen  Counts were correct  Dragon dictation  Discharge home after PACU  Disposition stable ANESTHESIA:   regional  EBL:  Total I/O In: 200 [I.V.:200] Out: -   Delay start of Pharmacological VTE agent (>24hrs) due to surgical blood loss or risk of bleeding: not applicable  Carpal tunnel release right wrist  Preop diagnosis carpal tunnel syndrome right wrist postop diagnosis same  Procedure open carpal tunnel release right wrist  Surgeon Aline Brochure  Anesthesia regional Bier block  Operative findings compression of the right median nerveoccupied lesions  Indications failure of conservative treatment to relieve pain and paresthesias and numbness and tingling of the right hand.  The patient was identified in the preop area we confirm the surgical site marked as right wrist. Chart update completed. Patient taken to surgery. She had 2 g of Ancef. After establishing a Bier block her arm was prepped with ChloraPrep.  Timeout executed completed and confirmed site.  A straight incision was made over the carpal tunnel in line with the radial border of the ring finger. Blunt dissection was carried out to find the distal aspect of the carpal tunnel. A blunted judgment was passed beneath the carpal tunnel. Sharp incision was then used to release the transverse carpal ligament. The contents of the carpal tunnel were inspected. The median nerve was compressed with slight discoloration.  The wound was irrigated and then closed with 3-0 nylon suture. We injected 10 mL of plain Marcaine on the radial  side of the incision  A sterile bandage was applied and the tourniquet was released the color of the hand and capillary refill were normal  The patient was taken to the recovery room in stable condition

## 2016-04-25 NOTE — Op Note (Signed)
04/25/2016  10:06 AM  PATIENT:  Yolanda [name]  45 y.o. female  PRE-OPERATIVE DIAGNOSIS:  right carpal tunnel  POST-OPERATIVE DIAGNOSIS:  right carpal tunnel  PROCEDURE:  Procedure(s): CARPAL TUNNEL RELEASE (Right)  SURGEON:  Surgeon(s) and Role:    * [name], MD - Primary Anesthesia Bier block  No blood loss  No drains  10 mL plain half percent Marcaine injected  No specimen  Counts were correct  Dragon dictation  Discharge home after PACU  Disposition stable ANESTHESIA:   regional  EBL:  Total I/O In: 200 [I.V.:200] Out: -   Delay start of Pharmacological VTE agent (>24hrs) due to surgical blood loss or risk of bleeding: not applicable  Carpal tunnel release right wrist  Preop diagnosis carpal tunnel syndrome right wrist postop diagnosis same  Procedure open carpal tunnel release right wrist  Surgeon Aline Brochure  Anesthesia regional Bier block  Operative findings compression of the right median nerveoccupied lesions  Indications failure of conservative treatment to relieve pain and paresthesias and numbness and tingling of the right hand.  The patient was identified in the preop area we confirm the surgical site marked as right wrist. Chart update completed. Patient taken to surgery. She had 2 g of Ancef. After establishing a Bier block her arm was prepped with ChloraPrep.  Timeout executed completed and confirmed site.  A straight incision was made over the carpal tunnel in line with the radial border of the ring finger. Blunt dissection was carried out to find the distal aspect of the carpal tunnel. A blunted judgment was passed beneath the carpal tunnel. Sharp incision was then used to release the transverse carpal ligament. The contents of the carpal tunnel were inspected. The median nerve was compressed with slight discoloration.  The wound was irrigated and then closed with 3-0 nylon suture. We injected 10 mL of plain Marcaine on the radial  side of the incision  A sterile bandage was applied and the tourniquet was released the color of the hand and capillary refill were normal  The patient was taken to the recovery room in stable condition

## 2016-04-25 NOTE — Anesthesia Preprocedure Evaluation (Signed)
Anesthesia Evaluation  Patient identified by MRN, date of birth, ID band Patient awake    Reviewed: Allergy & Precautions, H&P , NPO status , Patient's Chart, lab work & pertinent test results, reviewed documented beta blocker date and time   Airway Mallampati: II  TM Distance: >3 FB Neck ROM: Full    Dental no notable dental hx.    Pulmonary sleep apnea and Continuous Positive Airway Pressure Ventilation ,    Pulmonary exam normal        Cardiovascular hypertension, Pt. on medications and Pt. on home beta blockers  Rhythm:Regular Rate:Normal     Neuro/Psych PSYCHIATRIC DISORDERS Anxiety Depression Bipolar Disorder    GI/Hepatic GERD  Medicated and Controlled,  Endo/Other  diabetes, Poorly Controlled, Type 2, Oral Hypoglycemic Agents  Renal/GU      Musculoskeletal negative musculoskeletal ROS (+)   Abdominal (+) + obese,  Abdomen: soft.    Peds  Hematology negative hematology ROS (+)   Anesthesia Other Findings   Reproductive/Obstetrics negative OB ROS                             Anesthesia Physical Anesthesia Plan  ASA: III  Anesthesia Plan: Bier Block   Post-op Pain Management:    Induction: Intravenous  Airway Management Planned:   Additional Equipment:   Intra-op Plan:   Post-operative Plan:   Informed Consent: I have reviewed the patients History and Physical, chart, labs and discussed the procedure including the risks, benefits and alternatives for the proposed anesthesia with the patient or authorized representative who has indicated his/her understanding and acceptance.     Plan Discussed with:   Anesthesia Plan Comments:         Anesthesia Quick Evaluation

## 2016-04-25 NOTE — Anesthesia Postprocedure Evaluation (Signed)
Anesthesia Post Note  Patient: Yolanda [name]  Procedure(s) Performed: Procedure(s) (LRB): CARPAL TUNNEL RELEASE (Right)  Patient location during evaluation: PACU Anesthesia Type: Bier Block Level of consciousness: awake, oriented and patient cooperative Pain management: pain level controlled Vital Signs Assessment: post-procedure vital signs reviewed and stable Respiratory status: spontaneous breathing, nonlabored ventilation and respiratory function stable Cardiovascular status: blood pressure returned to baseline Postop Assessment: no signs of nausea or vomiting Anesthetic complications: no    Last Vitals:  Filed Vitals:   04/25/16 0910 04/25/16 0920  BP: 108/75 113/77  Pulse:  94  Temp:  36.6 C  Resp: 13 14    Last Pain: There were no vitals filed for this visit.               Sharren Schnurr J

## 2016-04-25 NOTE — Anesthesia Procedure Notes (Signed)
Anesthesia Regional Block:  Bier block (IV Regional)  Pre-Anesthetic Checklist: ,,, Correct Patient, Correct Site, Correct Laterality, Correct Procedure, Correct Position, site marked, risks and benefits discussed, Surgical consent, Pre-op evaluation,  At surgeon's request  Laterality: Right     Needles:  Injection technique: Single-shot      Additional Needles: Bier block (IV Regional) Narrative:   Performed by: With CRNAs

## 2016-04-26 ENCOUNTER — Encounter (HOSPITAL_COMMUNITY): Payer: Self-pay | Admitting: Orthopedic Surgery

## 2016-05-06 ENCOUNTER — Encounter: Payer: Self-pay | Admitting: Orthopedic Surgery

## 2016-05-06 ENCOUNTER — Ambulatory Visit: Payer: Medicare Other | Admitting: Orthopedic Surgery

## 2016-05-06 VITALS — BP 132/90 | Ht 68.0 in | Wt 231.0 lb

## 2016-05-06 DIAGNOSIS — Z4789 Encounter for other orthopedic aftercare: Secondary | ICD-10-CM

## 2016-05-06 NOTE — Patient Instructions (Signed)
Resume regular medications for pain  Hand exercises 3 wks follow up  dont lift over 5 lbs

## 2016-05-06 NOTE — Progress Notes (Signed)
  Yolanda [name] is status post right carpal tunnel release   S:   Chief Complaint  Patient presents with  . Follow-up    POST OP 1, RT CTR, DOS 04/25/16   Patient reports that they're doing well with mild discomfort.  The incision is clean dry and intact with no drainage The dressing was changed. SUTURES WERE REMOVED Patient WILL RETURN 3 WEEKS

## 2016-05-07 ENCOUNTER — Telehealth: Payer: Self-pay | Admitting: Orthopedic Surgery

## 2016-05-07 NOTE — Telephone Encounter (Signed)
I've continued to call patient this afternoon, since 2:20pm; also called all contacts on her list; left message with friend, Ms. Julien Girt, who said she would try to reach her.

## 2016-05-07 NOTE — Telephone Encounter (Signed)
Patient called, was seen here yesterday, 05/06/16, states has concern about surgical site, right hand/wrist, status post carpal tunnel surgery 04/25/16; states no pain or drainage, but said "wound is open"; said steri-strips fell off already.  Please advise.  Ph# [phone]

## 2016-05-07 NOTE — Telephone Encounter (Signed)
Any post surgical wound problems need to come in for office visit same day

## 2016-05-07 NOTE — Telephone Encounter (Signed)
CORRECT

## 2016-05-07 NOTE — Telephone Encounter (Signed)
PATIENT WALKED INTO OFFICE, TOP LAYER WOUND OPEN, CLEAN, NO SIGNS OF INFECTION, STERI STRIPS AND ACE BANDAGE APPLIED, APPOINTMENT GIVEN FOR WOUND CHECK WITH DR Aline Brochure TOMORROW

## 2016-05-07 NOTE — Telephone Encounter (Signed)
I called back to patient to offer appointment today, as I initially did. Patient's phone has no voice message set up.

## 2016-05-08 ENCOUNTER — Ambulatory Visit: Payer: Medicare Other | Admitting: Orthopedic Surgery

## 2016-05-08 VITALS — BP 137/95 | HR 112 | Temp 97.2°F | Ht 67.0 in | Wt 228.0 lb

## 2016-05-08 DIAGNOSIS — Z4789 Encounter for other orthopedic aftercare: Secondary | ICD-10-CM

## 2016-05-08 NOTE — Patient Instructions (Signed)
NEOSPORIN   KEEP APPT

## 2016-05-08 NOTE — Progress Notes (Signed)
Chief Complaint  Patient presents with  . Wound Check    Right CTR DOS 04/25/16    BP 137/95 mmHg  Pulse 112  Temp(Src) 97.2 F (36.2 C)  Ht 5' 7"  (1.702 m)  Wt 228 lb (103.42 kg)  BMI 35.70 kg/m2  LMP 04/09/2016  WOUND EPIDERMAL LAYER OPEN WITH DEEP LAYER OF SKIN CLOSED OVER   ADD NEOSPORIN   KEEP REG APPT

## 2016-05-08 NOTE — Telephone Encounter (Signed)
Noted.  Please refer to all preceding notes as well.

## 2016-05-14 ENCOUNTER — Ambulatory Visit: Payer: Medicare Other | Admitting: "Endocrinology

## 2016-05-27 ENCOUNTER — Encounter: Payer: Self-pay | Admitting: Orthopedic Surgery

## 2016-05-27 ENCOUNTER — Ambulatory Visit (INDEPENDENT_AMBULATORY_CARE_PROVIDER_SITE_OTHER): Payer: Medicare Other | Admitting: Orthopedic Surgery

## 2016-05-27 VITALS — BP 85/63 | Ht 67.0 in | Wt 220.0 lb

## 2016-05-27 DIAGNOSIS — Z4789 Encounter for other orthopedic aftercare: Secondary | ICD-10-CM

## 2016-05-27 NOTE — Progress Notes (Signed)
Chief Complaint  Patient presents with  . Follow-up    Right carpal tunnel release DOS 04/25/16    Postop right carpal tunnel release patient has some mild tenderness over the incision but her numbness and tingling are resolved she has some mild weakness her range of motion is returned to normal  She is interested in left carpal tunnel release and she will let us know when she's ready I've advised her to wait one month after an injection in the carpal tunnel prior to surgery

## 2016-05-27 NOTE — Patient Instructions (Signed)
CALL WHEN AND IF YOU WANT THE LEFT CARPAL TUNNEL RELEASED

## 2016-06-06 ENCOUNTER — Other Ambulatory Visit: Payer: Self-pay | Admitting: "Endocrinology

## 2016-06-07 LAB — COMPLETE METABOLIC PANEL WITH GFR
ALT: 14 U/L (ref 6–29)
AST: 15 U/L (ref 10–30)
Albumin: 4 g/dL (ref 3.6–5.1)
Alkaline Phosphatase: 99 U/L (ref 33–115)
BILIRUBIN TOTAL: 0.4 mg/dL (ref 0.2–1.2)
BUN: 15 mg/dL (ref 7–25)
CO2: 18 mmol/L — AB (ref 20–31)
CREATININE: 0.88 mg/dL (ref 0.50–1.10)
Calcium: 9.5 mg/dL (ref 8.6–10.2)
Chloride: 102 mmol/L (ref 98–110)
GFR, Est Non African American: 81 mL/min (ref >=60)
Glucose, Bld: 281 mg/dL — ABNORMAL HIGH (ref 65–99)
Potassium: 4.9 mmol/L (ref 3.5–5.3)
Sodium: 135 mmol/L (ref 135–146)
TOTAL PROTEIN: 6.8 g/dL (ref 6.1–8.1)

## 2016-06-07 LAB — TSH: TSH: 1.62 mIU/L

## 2016-06-07 LAB — HEMOGLOBIN A1C
Hgb A1c MFr Bld: 9.5 % — ABNORMAL HIGH (ref <5.7)
Mean Plasma Glucose: 226 mg/dL

## 2016-06-07 LAB — T4, FREE: FREE T4: 1.2 ng/dL (ref 0.8–1.8)

## 2016-06-11 ENCOUNTER — Encounter: Payer: Self-pay | Admitting: "Endocrinology

## 2016-06-11 ENCOUNTER — Ambulatory Visit (INDEPENDENT_AMBULATORY_CARE_PROVIDER_SITE_OTHER): Payer: Medicare Other | Admitting: "Endocrinology

## 2016-06-11 VITALS — BP 127/90 | HR 126 | Ht 67.0 in | Wt 221.0 lb

## 2016-06-11 DIAGNOSIS — Z794 Long term (current) use of insulin: Secondary | ICD-10-CM | POA: Diagnosis not present

## 2016-06-11 DIAGNOSIS — E1165 Type 2 diabetes mellitus with hyperglycemia: Secondary | ICD-10-CM | POA: Diagnosis not present

## 2016-06-11 DIAGNOSIS — Z9119 Patient's noncompliance with other medical treatment and regimen: Secondary | ICD-10-CM

## 2016-06-11 DIAGNOSIS — E118 Type 2 diabetes mellitus with unspecified complications: Secondary | ICD-10-CM

## 2016-06-11 DIAGNOSIS — E785 Hyperlipidemia, unspecified: Secondary | ICD-10-CM

## 2016-06-11 DIAGNOSIS — I10 Essential (primary) hypertension: Secondary | ICD-10-CM | POA: Diagnosis not present

## 2016-06-11 DIAGNOSIS — Z91199 Patient's noncompliance with other medical treatment and regimen due to unspecified reason: Secondary | ICD-10-CM

## 2016-06-11 DIAGNOSIS — IMO0002 Reserved for concepts with insufficient information to code with codable children: Secondary | ICD-10-CM
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
## 2016-06-11 NOTE — Patient Instructions (Signed)
Advice for weight management -For most of us the best way to lose weight is by diet management. Generally speaking, diet management means restricting carbohydrate consumption to minimum possible (and to unprocessed or minimally processed complex starch) and increasing protein intake (animal or plant source), fruits, and vegetables.  -Sticking to a routine mealtime to eat 3 meals a day and avoiding unnecessary snacks is shown to have a big role in weight control.  -It is better to avoid simple carbohydrates including: Cakes, Desserts, Ice Cream, Soda (diet and regular), Sweet Tea, Candies, Chips, Cookies, Artificial Sweeteners, and "Sugar-free" Products.   -Exercise: 30 minutes a day 3-4 days a week, or 150 minutes a week. Combine stretch, strength, and aerobic activities. You may seek evaluation by your heart doctor prior to initiating exercise if you have high risk for heart disease.  -If you are interested, we can schedule a visit with Ginnifer Crumpton, RDN, CDE for individualized nutrition education.  

## 2016-09-12 ENCOUNTER — Ambulatory Visit: Payer: Medicare Other | Admitting: "Endocrinology

## 2016-10-11 ENCOUNTER — Encounter (HOSPITAL_COMMUNITY): Payer: Self-pay | Admitting: Emergency Medicine

## 2016-10-11 ENCOUNTER — Emergency Department (HOSPITAL_COMMUNITY)
Admission: EM | Admit: 2016-10-11 | Discharge: 2016-10-12 | Disposition: A | Discharge status: Home / Self Care | Payer: Medicare Other | Attending: Emergency Medicine | Admitting: Emergency Medicine

## 2016-10-11 DIAGNOSIS — Z79899 Other long term (current) drug therapy: Secondary | ICD-10-CM | POA: Insufficient documentation

## 2016-10-11 DIAGNOSIS — Z7982 Long term (current) use of aspirin: Secondary | ICD-10-CM | POA: Insufficient documentation

## 2016-10-11 DIAGNOSIS — Z794 Long term (current) use of insulin: Secondary | ICD-10-CM | POA: Diagnosis not present

## 2016-10-11 DIAGNOSIS — E119 Type 2 diabetes mellitus without complications: Secondary | ICD-10-CM | POA: Diagnosis not present

## 2016-10-11 DIAGNOSIS — E860 Dehydration: Secondary | ICD-10-CM | POA: Diagnosis not present

## 2016-10-11 DIAGNOSIS — I10 Essential (primary) hypertension: Secondary | ICD-10-CM | POA: Insufficient documentation

## 2016-10-11 DIAGNOSIS — R55 Syncope and collapse: Secondary | ICD-10-CM | POA: Diagnosis present

## 2016-10-11 DIAGNOSIS — R531 Weakness: Secondary | ICD-10-CM

## 2016-10-11 LAB — CBC
HCT: 48.5 % — ABNORMAL HIGH (ref 36.0–46.0)
Hemoglobin: 16.2 g/dL — ABNORMAL HIGH (ref 12.0–15.0)
MCH: 28.4 pg (ref 26.0–34.0)
MCHC: 33.4 g/dL (ref 30.0–36.0)
MCV: 85.1 fL (ref 78.0–100.0)
PLATELETS: 365 10*3/uL (ref 150–400)
RBC: 5.7 MIL/uL — ABNORMAL HIGH (ref 3.87–5.11)
RDW: 13.6 % (ref 11.5–15.5)
WBC: 14.9 10*3/uL — AB (ref 4.0–10.5)

## 2016-10-11 LAB — URINE MICROSCOPIC-ADD ON: RBC / HPF: NONE SEEN RBC/hpf (ref 0–5)

## 2016-10-11 LAB — BASIC METABOLIC PANEL
Anion gap: 10 (ref 5–15)
BUN: 19 mg/dL (ref 6–20)
CHLORIDE: 104 mmol/L (ref 101–111)
CO2: 24 mmol/L (ref 22–32)
CREATININE: 1.04 mg/dL — AB (ref 0.44–1.00)
Calcium: 9 mg/dL (ref 8.9–10.3)
GFR calc Af Amer: >60 mL/min (ref >60)
GFR calc non Af Amer: >60 mL/min (ref >60)
Glucose, Bld: 106 mg/dL — ABNORMAL HIGH (ref 65–99)
Potassium: 3.6 mmol/L (ref 3.5–5.1)
SODIUM: 138 mmol/L (ref 135–145)

## 2016-10-11 LAB — URINALYSIS, ROUTINE W REFLEX MICROSCOPIC
BILIRUBIN URINE: NEGATIVE
HGB URINE DIPSTICK: NEGATIVE
KETONES UR: NEGATIVE mg/dL
Leukocytes, UA: NEGATIVE
Nitrite: NEGATIVE
PROTEIN: NEGATIVE mg/dL
Specific Gravity, Urine: >1.03 — ABNORMAL HIGH (ref 1.005–1.030)
pH: 5.5 (ref 5.0–8.0)

## 2016-10-11 LAB — PREGNANCY, URINE: Preg Test, Ur: NEGATIVE

## 2016-10-11 LAB — CBG MONITORING, ED: Glucose-Capillary: 129 mg/dL — ABNORMAL HIGH (ref 65–99)

## 2016-10-11 MED ORDER — SODIUM CHLORIDE 0.9 % IV BOLUS (SEPSIS)
1000.0000 mL | Freq: Once | INTRAVENOUS | Status: AC
  Administered 2016-10-12: 1000 mL via INTRAVENOUS

## 2016-10-11 MED ORDER — SODIUM CHLORIDE 0.9 % IV BOLUS (SEPSIS)
1000.0000 mL | Freq: Once | INTRAVENOUS | Status: AC
  Administered 2016-10-11: 1000 mL via INTRAVENOUS

## 2016-10-11 MED ORDER — ONDANSETRON HCL 4 MG/2ML IJ SOLN
4.0000 mg | Freq: Once | INTRAMUSCULAR | Status: AC
  Administered 2016-10-11: 4 mg via INTRAVENOUS
  Filled 2016-10-11: qty 2

## 2016-10-11 MED ORDER — SODIUM CHLORIDE 0.9 % IV BOLUS (SEPSIS): 500.0000 mL | Freq: Once | INTRAVENOUS | Status: DC

## 2016-10-11 NOTE — ED Triage Notes (Signed)
Pt reports she started feeling weak today. Pt reports diaphoresis and nausea. States she checked he CBG just prior to arrival and it was 23. Pt states she is prone to UTIs and has had prior bloodstream infection. Pt states "I feel like I'm going to pass out at any moment."
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
## 2016-10-12 DIAGNOSIS — E860 Dehydration: Secondary | ICD-10-CM | POA: Diagnosis not present

## 2016-10-12 LAB — TROPONIN I: Troponin I: <0.03 ng/mL (ref <0.03)

## 2016-10-12 NOTE — Discharge Instructions (Signed)
Try to drink more fluids to prevent dehydration. Monitor your blood sugar closely. Recheck if you feel worse again.

## 2016-10-12 NOTE — ED Notes (Signed)
Pt alert & oriented x4, stable gait. Patient given discharge instructions, paperwork & prescription(s). Patient  instructed to stop at the registration desk to finish any additional paperwork. Patient verbalized understanding. Pt left department w/ no further questions. 

## 2016-10-17 ENCOUNTER — Other Ambulatory Visit: Payer: Self-pay | Admitting: Obstetrics & Gynecology

## 2016-12-31 DIAGNOSIS — G5603 Carpal tunnel syndrome, bilateral upper limbs: Secondary | ICD-10-CM | POA: Diagnosis not present

## 2016-12-31 DIAGNOSIS — Z79891 Long term (current) use of opiate analgesic: Secondary | ICD-10-CM | POA: Diagnosis not present

## 2016-12-31 DIAGNOSIS — M545 Low back pain: Secondary | ICD-10-CM | POA: Diagnosis not present

## 2016-12-31 DIAGNOSIS — M25561 Pain in right knee: Secondary | ICD-10-CM | POA: Diagnosis not present

## 2016-12-31 DIAGNOSIS — M797 Fibromyalgia: Secondary | ICD-10-CM | POA: Diagnosis not present

## 2017-01-24 ENCOUNTER — Other Ambulatory Visit (HOSPITAL_COMMUNITY): Payer: Self-pay | Admitting: Family Medicine

## 2017-01-24 ENCOUNTER — Ambulatory Visit (HOSPITAL_COMMUNITY)
Admission: RE | Admit: 2017-01-24 | Discharge: 2017-01-24 | Disposition: A | Discharge status: Home / Self Care | Payer: Medicare Other | Source: Ambulatory Visit | Attending: Family Medicine | Admitting: Family Medicine

## 2017-01-24 DIAGNOSIS — R937 Abnormal findings on diagnostic imaging of other parts of musculoskeletal system: Secondary | ICD-10-CM | POA: Diagnosis not present

## 2017-01-24 DIAGNOSIS — M25532 Pain in left wrist: Secondary | ICD-10-CM | POA: Diagnosis present

## 2017-02-26 ENCOUNTER — Other Ambulatory Visit: Payer: Self-pay | Admitting: "Endocrinology

## 2017-03-12 ENCOUNTER — Other Ambulatory Visit: Payer: Self-pay | Admitting: Obstetrics & Gynecology

## 2017-05-11 ENCOUNTER — Emergency Department (HOSPITAL_COMMUNITY)
Admission: EM | Admit: 2017-05-11 | Discharge: 2017-05-12 | Disposition: A | Discharge status: Home / Self Care | Payer: Medicare Other | Attending: Emergency Medicine | Admitting: Emergency Medicine

## 2017-05-11 ENCOUNTER — Encounter (HOSPITAL_COMMUNITY): Payer: Self-pay | Admitting: *Deleted

## 2017-05-11 ENCOUNTER — Emergency Department (HOSPITAL_COMMUNITY): Payer: Medicare Other

## 2017-05-11 DIAGNOSIS — Z7982 Long term (current) use of aspirin: Secondary | ICD-10-CM | POA: Insufficient documentation

## 2017-05-11 DIAGNOSIS — R0781 Pleurodynia: Secondary | ICD-10-CM | POA: Insufficient documentation

## 2017-05-11 DIAGNOSIS — Y92410 Unspecified street and highway as the place of occurrence of the external cause: Secondary | ICD-10-CM | POA: Insufficient documentation

## 2017-05-11 DIAGNOSIS — Y999 Unspecified external cause status: Secondary | ICD-10-CM | POA: Insufficient documentation

## 2017-05-11 DIAGNOSIS — Z794 Long term (current) use of insulin: Secondary | ICD-10-CM | POA: Insufficient documentation

## 2017-05-11 DIAGNOSIS — I10 Essential (primary) hypertension: Secondary | ICD-10-CM | POA: Diagnosis not present

## 2017-05-11 DIAGNOSIS — Y939 Activity, unspecified: Secondary | ICD-10-CM | POA: Insufficient documentation

## 2017-05-11 DIAGNOSIS — E119 Type 2 diabetes mellitus without complications: Secondary | ICD-10-CM | POA: Diagnosis not present

## 2017-05-11 DIAGNOSIS — S299XXA Unspecified injury of thorax, initial encounter: Secondary | ICD-10-CM | POA: Diagnosis present

## 2017-05-11 MED ORDER — FENTANYL CITRATE (PF) 100 MCG/2ML IJ SOLN
50.0000 ug | Freq: Once | INTRAMUSCULAR | Status: AC
  Administered 2017-05-11: 50 ug via INTRAVENOUS
  Filled 2017-05-11: qty 2
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
## 2017-05-11 NOTE — ED Notes (Signed)
Received report on pt, family at bedside, update given, pt pending discharge paperwork,
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
## 2017-05-11 NOTE — ED Notes (Signed)
Pt in radiology 

## 2017-05-12 MED ORDER — CYCLOBENZAPRINE HCL 10 MG PO TABS: 10.0000 mg | ORAL_TABLET | Freq: Two times a day (BID) | ORAL | 0 refills | Status: DC | PRN

## 2017-05-12 NOTE — Discharge Instructions (Signed)
X-ray of your back and ribs show no fracture. You will be sore for several days. Prescription for muscle relaxer.
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
## 2017-05-24 ENCOUNTER — Other Ambulatory Visit: Payer: Self-pay | Admitting: "Endocrinology

## 2017-07-24 ENCOUNTER — Encounter (HOSPITAL_BASED_OUTPATIENT_CLINIC_OR_DEPARTMENT_OTHER): Payer: Self-pay

## 2017-07-24 DIAGNOSIS — G4733 Obstructive sleep apnea (adult) (pediatric): Secondary | ICD-10-CM

## 2017-07-28 ENCOUNTER — Other Ambulatory Visit: Payer: Self-pay | Admitting: "Endocrinology

## 2017-07-30 ENCOUNTER — Other Ambulatory Visit: Payer: Self-pay | Admitting: "Endocrinology

## 2017-07-31 ENCOUNTER — Other Ambulatory Visit (HOSPITAL_COMMUNITY): Payer: Self-pay | Admitting: Family Medicine

## 2017-07-31 DIAGNOSIS — Z1231 Encounter for screening mammogram for malignant neoplasm of breast: Secondary | ICD-10-CM

## 2017-08-08 ENCOUNTER — Ambulatory Visit (HOSPITAL_COMMUNITY): Payer: Medicare Other

## 2017-08-14 ENCOUNTER — Ambulatory Visit (HOSPITAL_COMMUNITY)
Admission: RE | Admit: 2017-08-14 | Discharge: 2017-08-14 | Disposition: A | Discharge status: Home / Self Care | Payer: Medicare Other | Source: Ambulatory Visit | Attending: Family Medicine | Admitting: Family Medicine

## 2017-08-14 DIAGNOSIS — Z1231 Encounter for screening mammogram for malignant neoplasm of breast: Secondary | ICD-10-CM

## 2017-12-01 ENCOUNTER — Other Ambulatory Visit: Payer: Self-pay | Admitting: Obstetrics & Gynecology

## 2018-02-02 ENCOUNTER — Ambulatory Visit (INDEPENDENT_AMBULATORY_CARE_PROVIDER_SITE_OTHER): Payer: Medicare Other | Admitting: Otolaryngology

## 2018-02-02 DIAGNOSIS — H66012 Acute suppurative otitis media with spontaneous rupture of ear drum, left ear: Secondary | ICD-10-CM | POA: Diagnosis not present

## 2018-02-02 DIAGNOSIS — H9012 Conductive hearing loss, unilateral, left ear, with unrestricted hearing on the contralateral side: Secondary | ICD-10-CM | POA: Diagnosis not present

## 2018-02-04 ENCOUNTER — Other Ambulatory Visit (INDEPENDENT_AMBULATORY_CARE_PROVIDER_SITE_OTHER): Payer: Self-pay | Admitting: Otolaryngology

## 2018-02-04 DIAGNOSIS — H918X3 Other specified hearing loss, bilateral: Secondary | ICD-10-CM

## 2018-02-04 DIAGNOSIS — IMO0001 Reserved for inherently not codable concepts without codable children: Secondary | ICD-10-CM

## 2018-02-20 IMAGING — MG MM DIGITAL SCREENING
5 series · 5 of 5 positions shown · non-contrast
Comparison: Previous exam(s).

CLINICAL DATA: Screening.

EXAM:
DIGITAL SCREENING BILATERAL MAMMOGRAM WITH CAD

[L MLO (1 of 2)]
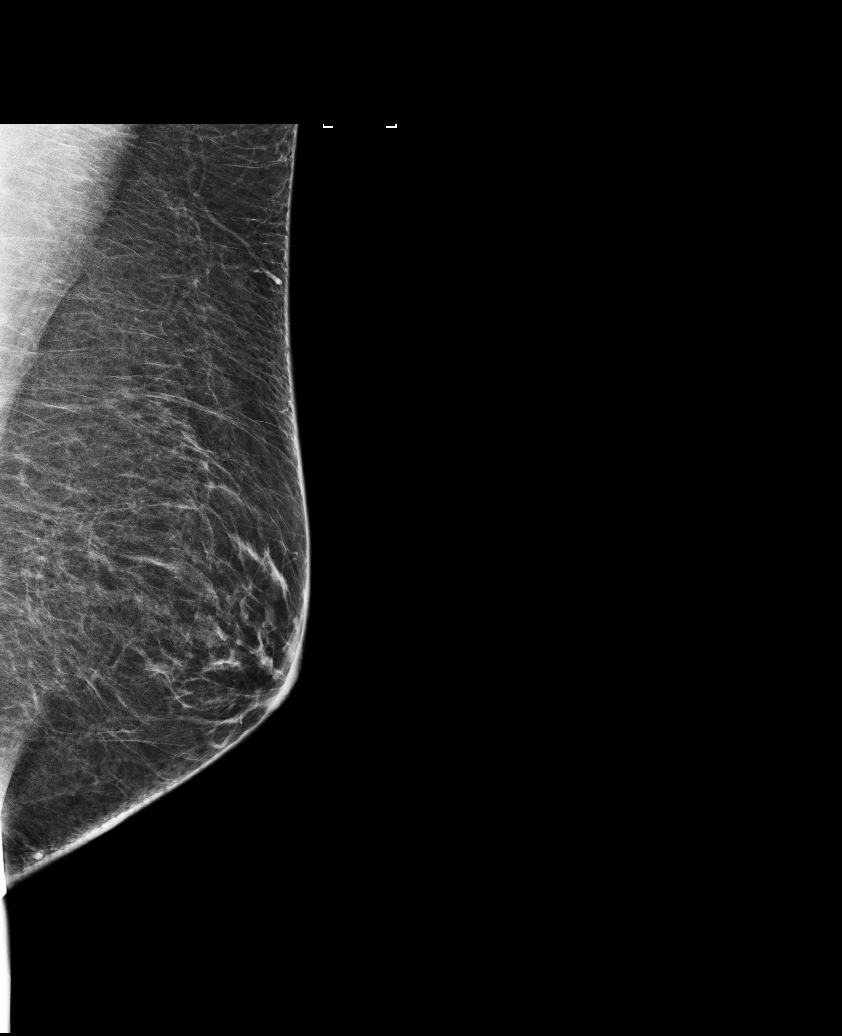

[R MLO]
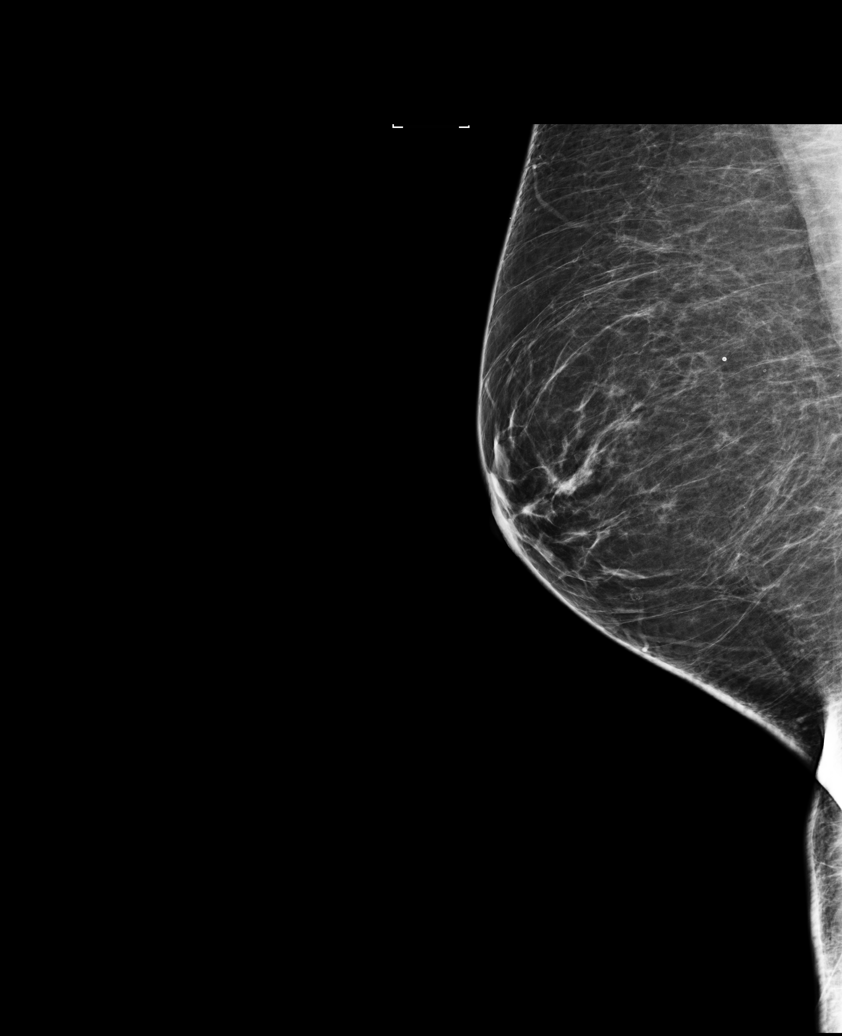

[L CC]
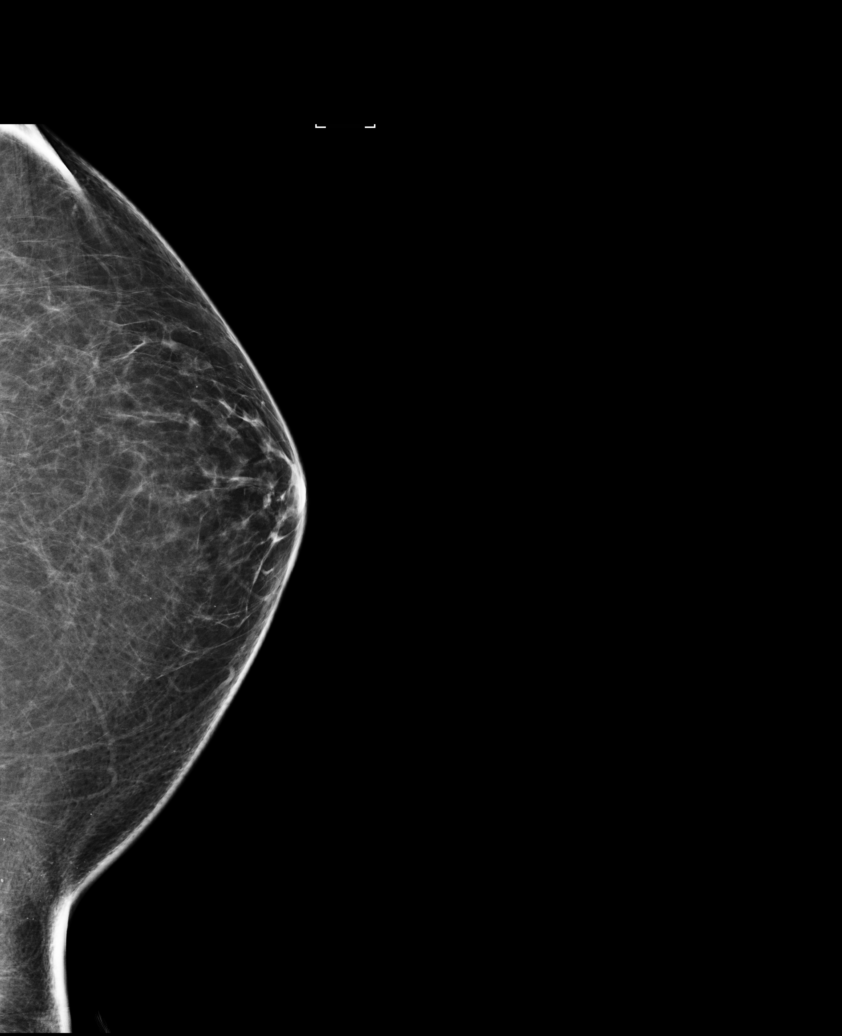

[R CC]
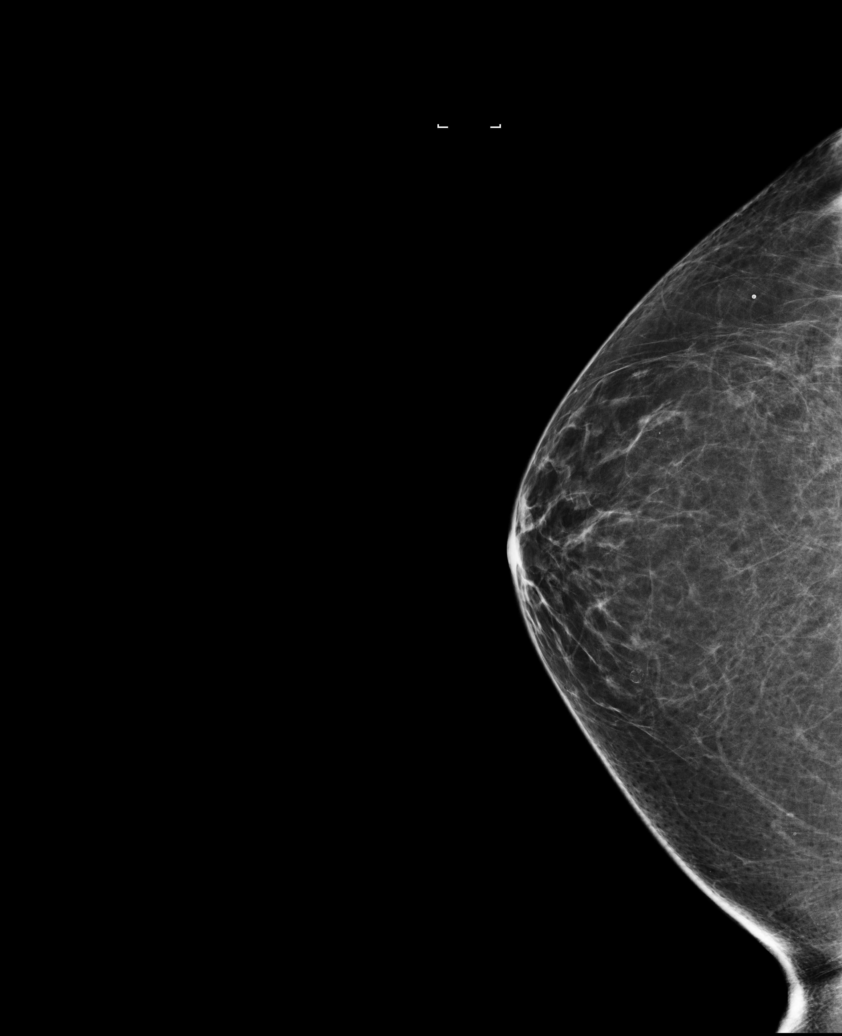

[L MLO (2 of 2)]
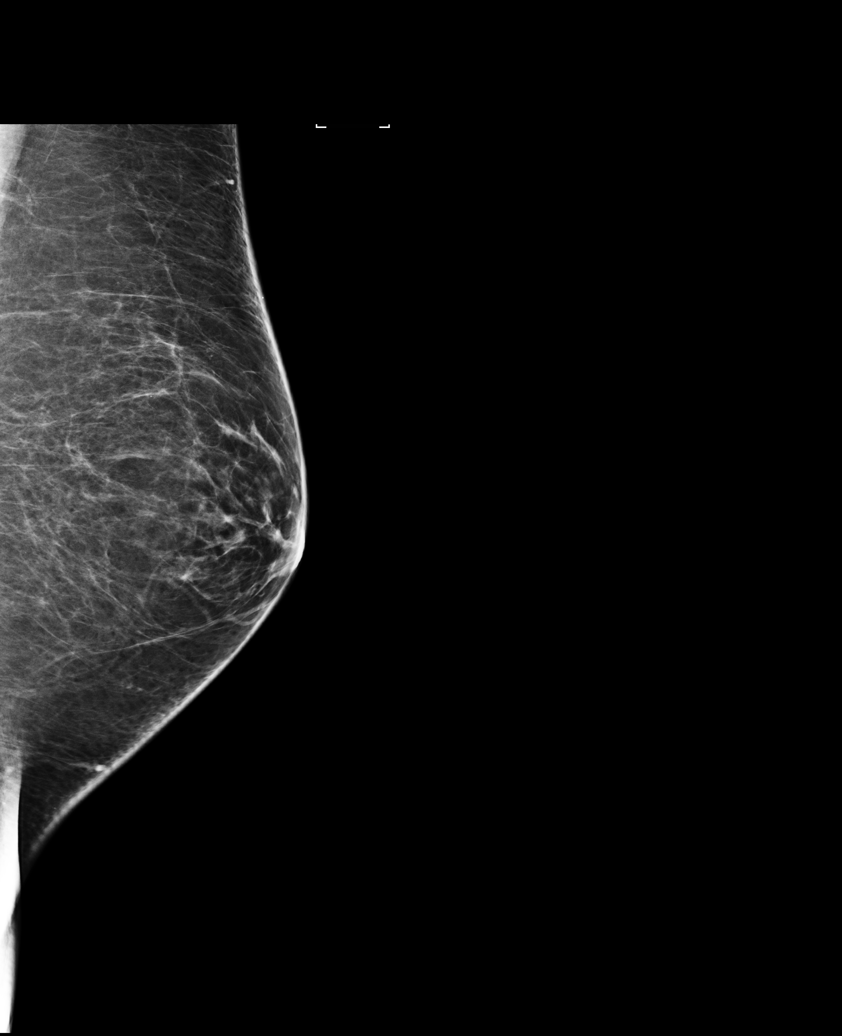

[5 of 5 positions shown; findings below may reference images not displayed]

ACR Breast Density Category b: There are scattered areas of
fibroglandular density.
FINDINGS: There are no findings suspicious for malignancy. Images were
processed with CAD.
IMPRESSION: No mammographic evidence of malignancy. A result letter of this
screening mammogram will be mailed directly to the patient.

RECOMMENDATION:
Screening mammogram in one year. (Code:AS-G-LCT)

BI-RADS CATEGORY  1: Negative.

## 2018-03-02 ENCOUNTER — Other Ambulatory Visit: Payer: Medicare Other

## 2018-03-18 ENCOUNTER — Other Ambulatory Visit: Payer: Medicare Other

## 2018-03-30 ENCOUNTER — Ambulatory Visit (INDEPENDENT_AMBULATORY_CARE_PROVIDER_SITE_OTHER): Payer: Medicare Other | Admitting: Otolaryngology

## 2018-04-03 ENCOUNTER — Ambulatory Visit
Admission: RE | Admit: 2018-04-03 | Discharge: 2018-04-03 | Disposition: A | Discharge status: Home / Self Care | Payer: Medicare Other | Source: Ambulatory Visit | Attending: Otolaryngology | Admitting: Otolaryngology

## 2018-04-03 DIAGNOSIS — H918X3 Other specified hearing loss, bilateral: Secondary | ICD-10-CM

## 2018-04-03 DIAGNOSIS — IMO0001 Reserved for inherently not codable concepts without codable children: Secondary | ICD-10-CM

## 2018-05-04 ENCOUNTER — Ambulatory Visit (INDEPENDENT_AMBULATORY_CARE_PROVIDER_SITE_OTHER): Payer: Medicaid Other | Admitting: Otolaryngology

## 2018-05-04 DIAGNOSIS — H9012 Conductive hearing loss, unilateral, left ear, with unrestricted hearing on the contralateral side: Secondary | ICD-10-CM

## 2018-05-04 DIAGNOSIS — H7012 Chronic mastoiditis, left ear: Secondary | ICD-10-CM

## 2018-05-04 DIAGNOSIS — H66012 Acute suppurative otitis media with spontaneous rupture of ear drum, left ear: Secondary | ICD-10-CM

## 2018-06-01 ENCOUNTER — Ambulatory Visit (INDEPENDENT_AMBULATORY_CARE_PROVIDER_SITE_OTHER): Payer: Medicaid Other | Admitting: Otolaryngology

## 2018-06-25 ENCOUNTER — Other Ambulatory Visit (HOSPITAL_COMMUNITY): Payer: Self-pay | Admitting: Family Medicine

## 2018-06-25 ENCOUNTER — Ambulatory Visit (HOSPITAL_COMMUNITY)
Admission: RE | Admit: 2018-06-25 | Discharge: 2018-06-25 | Disposition: A | Discharge status: Home / Self Care | Payer: Medicaid Other | Source: Ambulatory Visit | Attending: Family Medicine | Admitting: Family Medicine

## 2018-06-25 DIAGNOSIS — T148XXA Other injury of unspecified body region, initial encounter: Secondary | ICD-10-CM

## 2018-06-25 DIAGNOSIS — M25571 Pain in right ankle and joints of right foot: Secondary | ICD-10-CM | POA: Diagnosis present

## 2018-11-11 ENCOUNTER — Other Ambulatory Visit: Payer: Self-pay | Admitting: Obstetrics & Gynecology

## 2019-02-08 ENCOUNTER — Other Ambulatory Visit (HOSPITAL_COMMUNITY): Payer: Self-pay | Admitting: Family Medicine

## 2019-02-08 ENCOUNTER — Other Ambulatory Visit (HOSPITAL_COMMUNITY): Payer: Self-pay | Admitting: Obstetrics & Gynecology

## 2019-02-08 DIAGNOSIS — Z1231 Encounter for screening mammogram for malignant neoplasm of breast: Secondary | ICD-10-CM

## 2019-02-10 ENCOUNTER — Ambulatory Visit (HOSPITAL_COMMUNITY)
Admission: RE | Admit: 2019-02-10 | Discharge: 2019-02-10 | Disposition: A | Discharge status: Home / Self Care | Payer: Medicaid Other | Source: Ambulatory Visit | Attending: Obstetrics & Gynecology | Admitting: Obstetrics & Gynecology

## 2019-02-10 ENCOUNTER — Other Ambulatory Visit: Payer: Self-pay

## 2019-02-10 DIAGNOSIS — Z1231 Encounter for screening mammogram for malignant neoplasm of breast: Secondary | ICD-10-CM | POA: Diagnosis not present

## 2019-03-02 ENCOUNTER — Other Ambulatory Visit: Payer: Medicaid Other | Admitting: Obstetrics & Gynecology

## 2019-04-19 ENCOUNTER — Other Ambulatory Visit: Payer: Medicaid Other | Admitting: Obstetrics & Gynecology

## 2019-05-14 ENCOUNTER — Other Ambulatory Visit: Payer: Medicaid Other | Admitting: Obstetrics & Gynecology

## 2019-05-20 ENCOUNTER — Telehealth: Payer: Self-pay | Admitting: Adult Health

## 2019-05-20 NOTE — Telephone Encounter (Signed)
Patient informed we are still not allowing any visitors or children to come in during appointment time unless physical assistance is needed. Asked if has had any exposure to anyone suspected or confirmed of having COVID-19 or if she was experiencing any of the following, to reschedule: fever, cough, shortness of breath, muscle pain, diarrhea, rash, vomiting, abdominal pain, red eye, weakness, bruising, bleeding, joint pain, or a severe headache.  Stated no to all.  Asked that she complete E-check-in via mychart prior to arrival.  Advised to check-in via [facility] and call our office on arrival in our office parking lot to complete registration over the phone. Advised to also use the provided hand sanitizer when entering the office and to wear a mask if she has one, if not, we will provide one. Pt verbalized understanding.

## 2019-05-24 ENCOUNTER — Other Ambulatory Visit: Payer: Medicaid Other | Admitting: Adult Health

## 2019-07-13 ENCOUNTER — Other Ambulatory Visit: Payer: Medicaid Other | Admitting: Adult Health

## 2019-10-05 ENCOUNTER — Telehealth: Payer: Self-pay | Admitting: Adult Health

## 2019-10-05 NOTE — Telephone Encounter (Signed)
Called patient regarding appointment and the following message was left:   We have you scheduled for an upcoming appointment at our office. At this time, we are still not allowing visitors or children during the appointment, however, a support person, over age 47, may accompany you to your appointment if assistance is needed for safety or care concerns. Otherwise, support persons should remain outside until the visit is complete.   We ask if you have had any exposure to anyone suspected or confirmed of having COVID-19 or if you are experiencing any of the following, to call and reschedule your appointment: fever, cough, shortness of breath, muscle pain, diarrhea, rash, vomiting, abdominal pain, red eye, weakness, bruising, bleeding, joint pain, or a severe headache.   Please know we will ask you these questions or similar questions when you arrive for your appointment and again its how we are keeping everyone safe.    Also,to keep you safe, please use the provided hand sanitizer when you enter the office. We are asking everyone in the office to wear a mask to help prevent the spread of germs. If you have a mask of your own, please wear it to your appointment, if not, we are happy to provide one for you.  Thank you for understanding and your cooperation.    [facility] Staff

## 2019-10-06 ENCOUNTER — Other Ambulatory Visit: Payer: Medicaid Other | Admitting: Adult Health

## 2019-10-25 ENCOUNTER — Other Ambulatory Visit (HOSPITAL_COMMUNITY): Payer: Self-pay | Admitting: Neurology

## 2019-10-25 ENCOUNTER — Other Ambulatory Visit: Payer: Self-pay | Admitting: Neurology

## 2019-10-25 DIAGNOSIS — M545 Low back pain, unspecified: Secondary | ICD-10-CM

## 2019-10-25 DIAGNOSIS — M5416 Radiculopathy, lumbar region: Secondary | ICD-10-CM

## 2019-10-25 DIAGNOSIS — M79604 Pain in right leg: Secondary | ICD-10-CM

## 2019-11-02 ENCOUNTER — Ambulatory Visit (HOSPITAL_COMMUNITY): Payer: Medicaid Other

## 2019-11-02 ENCOUNTER — Encounter (HOSPITAL_COMMUNITY): Payer: Self-pay

## 2019-12-06 ENCOUNTER — Telehealth: Payer: Self-pay | Admitting: Obstetrics & Gynecology

## 2019-12-06 NOTE — Telephone Encounter (Signed)
Called patient regarding appointment scheduled in our office and advised to come alone to the visit, however, a support person, over age 48, may accompany her to appointment if assistance is needed for safety or care concerns. Otherwise, support persons should remain outside until the visit is complete.   Prescreen questions asked: 1. Any of the following symptoms of COVID such as chills, fever, cough, shortness of breath, muscle pain, diarrhea, rash, vomiting, abdominal pain, red eye, weakness, bruising, bleeding, joint pain, loss of taste or smell, a severe headache, sore throat, fatigue 2. Any exposure to anyone suspected or confirmed of having COVID-19 3. Awaiting test results for COVID-19  Also,to keep you safe, please use the provided hand sanitizer when you enter the office. We are asking everyone in the office to wear a mask to help prevent the spread of germs. If you have a mask of your own, please wear it to your appointment, if not, we are happy to provide one for you.  Thank you for understanding and your cooperation.    [facility] Staff

## 2019-12-07 ENCOUNTER — Other Ambulatory Visit: Payer: Medicaid Other | Admitting: Obstetrics & Gynecology

## 2019-12-10 ENCOUNTER — Other Ambulatory Visit: Payer: Self-pay | Admitting: Obstetrics & Gynecology

## 2020-01-07 ENCOUNTER — Telehealth: Payer: Self-pay | Admitting: Obstetrics & Gynecology

## 2020-01-07 NOTE — Telephone Encounter (Signed)
Called patient regarding appointment and the following message was left:   We have you scheduled for an upcoming appointment at our office. At this time, we are still not allowing visitors during the appointment. Otherwise, support persons should remain outside until the visit is complete.   We ask if you are sick, have any symptoms of COVID, have had any exposure to anyone suspected or confirmed of having COVID-19, or are awaiting test results for COVID-19, to call our office as we may need to reschedule you for a virtual visit or schedule your appointment for a later date.    Please know we will ask you these questions or similar questions when you arrive for your appointment and understand this is how we are keeping everyone safe.    Also,to keep you safe, please use the provided hand sanitizer when you enter the office. We are asking everyone in the office to wear a mask to help prevent the spread of germs. If you have a mask of your own, please wear it to your appointment, if not, we are happy to provide one for you.  Thank you for understanding and your cooperation.    [facility] Staff

## 2020-01-10 ENCOUNTER — Other Ambulatory Visit: Payer: Medicaid Other | Admitting: Obstetrics & Gynecology

## 2020-02-01 DIAGNOSIS — M79661 Pain in right lower leg: Secondary | ICD-10-CM | POA: Insufficient documentation

## 2020-02-01 DIAGNOSIS — M65241 Calcific tendinitis, right hand: Secondary | ICD-10-CM | POA: Insufficient documentation

## 2020-02-01 DIAGNOSIS — Z79891 Long term (current) use of opiate analgesic: Secondary | ICD-10-CM | POA: Insufficient documentation

## 2020-02-01 DIAGNOSIS — G40909 Epilepsy, unspecified, not intractable, without status epilepticus: Secondary | ICD-10-CM | POA: Insufficient documentation

## 2020-02-01 DIAGNOSIS — G5603 Carpal tunnel syndrome, bilateral upper limbs: Secondary | ICD-10-CM | POA: Insufficient documentation

## 2020-02-04 ENCOUNTER — Other Ambulatory Visit: Payer: Medicaid Other | Admitting: Obstetrics & Gynecology

## 2020-02-14 ENCOUNTER — Telehealth: Payer: Self-pay | Admitting: Obstetrics & Gynecology

## 2020-02-14 NOTE — Telephone Encounter (Signed)
Called patient regarding appointment scheduled in our office and advised to come alone to the visit.   Pre-screen questions asked: 1. Any of the following symptoms of COVID such as chills, fever, cough, shortness of breath, muscle pain, diarrhea, rash, vomiting, abdominal pain, red eye, weakness, bruising, bleeding, joint pain, loss of taste or smell, a severe headache, sore throat, fatigue 2. Any exposure to anyone suspected or confirmed of having COVID-19 3. Awaiting test results for COVID-19  Also,to keep you safe, please use the provided hand sanitizer when you enter the office. We are asking everyone in the office to wear a mask to help prevent the spread of germs. If you have a mask of your own, please wear it to your appointment, if not, we are happy to provide one for you.  Thank you for understanding and your cooperation.    [facility] Staff

## 2020-02-15 ENCOUNTER — Other Ambulatory Visit: Payer: Self-pay

## 2020-02-15 ENCOUNTER — Encounter: Payer: Self-pay | Admitting: Obstetrics & Gynecology

## 2020-02-15 ENCOUNTER — Other Ambulatory Visit (HOSPITAL_COMMUNITY)
Admission: RE | Admit: 2020-02-15 | Discharge: 2020-02-15 | Disposition: A | Discharge status: Home / Self Care | Payer: Medicaid Other | Source: Ambulatory Visit | Attending: Obstetrics & Gynecology | Admitting: Obstetrics & Gynecology

## 2020-02-15 ENCOUNTER — Ambulatory Visit: Payer: Medicaid Other | Admitting: Obstetrics & Gynecology

## 2020-02-15 VITALS — BP 121/81 | HR 106 | Ht 67.0 in | Wt 218.0 lb

## 2020-02-15 DIAGNOSIS — Z0000 Encounter for general adult medical examination without abnormal findings: Secondary | ICD-10-CM

## 2020-02-15 DIAGNOSIS — Z01419 Encounter for gynecological examination (general) (routine) without abnormal findings: Secondary | ICD-10-CM

## 2020-02-15 MED ORDER — TERCONAZOLE 0.4 % VA CREA: 1.0000 | TOPICAL_CREAM | Freq: Every day | VAGINAL | 2 refills | Status: DC

## 2020-02-15 MED ORDER — SOLIFENACIN SUCCINATE 10 MG PO TABS: 10.0000 mg | ORAL_TABLET | Freq: Every day | ORAL | 11 refills | Status: DC
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
## 2020-02-18 LAB — CYTOLOGY - PAP
Adequacy: ABSENT
Comment: NEGATIVE
Comment: NEGATIVE
Comment: NEGATIVE
Diagnosis: NEGATIVE
HPV 16: NEGATIVE
HPV 18 / 45: NEGATIVE
High risk HPV: POSITIVE — AB

## 2020-03-27 ENCOUNTER — Ambulatory Visit: Payer: Medicaid Other | Attending: Internal Medicine

## 2020-03-27 ENCOUNTER — Other Ambulatory Visit: Payer: Self-pay

## 2020-03-27 DIAGNOSIS — Z20822 Contact with and (suspected) exposure to covid-19: Secondary | ICD-10-CM

## 2020-03-28 LAB — NOVEL CORONAVIRUS, NAA: SARS-CoV-2, NAA: DETECTED — AB

## 2020-03-28 LAB — SARS-COV-2, NAA 2 DAY TAT

## 2020-03-29 ENCOUNTER — Other Ambulatory Visit: Payer: Self-pay | Admitting: Physician Assistant

## 2020-03-29 ENCOUNTER — Ambulatory Visit (HOSPITAL_COMMUNITY)
Admission: RE | Admit: 2020-03-29 | Discharge: 2020-03-29 | Disposition: A | Discharge status: Home / Self Care | Payer: Medicaid Other | Source: Ambulatory Visit | Attending: Pulmonary Disease | Admitting: Pulmonary Disease

## 2020-03-29 DIAGNOSIS — E1165 Type 2 diabetes mellitus with hyperglycemia: Secondary | ICD-10-CM

## 2020-03-29 DIAGNOSIS — U071 COVID-19: Secondary | ICD-10-CM | POA: Diagnosis present

## 2020-03-29 MED ORDER — SODIUM CHLORIDE 0.9 % IV SOLN
Freq: Once | INTRAVENOUS | Status: AC
  Filled 2020-03-29: qty 700

## 2020-03-29 MED ORDER — METHYLPREDNISOLONE SODIUM SUCC 125 MG IJ SOLR: 125.0000 mg | Freq: Once | INTRAMUSCULAR | Status: DC | PRN

## 2020-03-29 MED ORDER — ALBUTEROL SULFATE HFA 108 (90 BASE) MCG/ACT IN AERS: 2.0000 | INHALATION_SPRAY | Freq: Once | RESPIRATORY_TRACT | Status: DC | PRN

## 2020-03-29 MED ORDER — SODIUM CHLORIDE 0.9 % IV SOLN: INTRAVENOUS | Status: DC | PRN

## 2020-03-29 MED ORDER — FAMOTIDINE IN NACL 20-0.9 MG/50ML-% IV SOLN: 20.0000 mg | Freq: Once | INTRAVENOUS | Status: DC | PRN

## 2020-03-29 MED ORDER — DIPHENHYDRAMINE HCL 50 MG/ML IJ SOLN: 50.0000 mg | Freq: Once | INTRAMUSCULAR | Status: DC | PRN

## 2020-03-29 MED ORDER — EPINEPHRINE 0.3 MG/0.3ML IJ SOAJ: 0.3000 mg | Freq: Once | INTRAMUSCULAR | Status: DC | PRN

## 2020-03-29 NOTE — Progress Notes (Signed)
  I connected by phone with Yolanda [name] on 03/29/2020 at 8:12 AM to discuss the potential use of an new treatment for mild to moderate COVID-19 viral infection in non-hospitalized patients.  This patient is a 49 y.o. female that meets the FDA criteria for Emergency Use Authorization of bamlanivimab/etesevimab or casirivimab/imdevimab.  Has a (+) direct SARS-CoV-2 viral test result  Has mild or moderate COVID-19   Is ? 49 years of age and weighs ? 40 kg  Is NOT hospitalized due to COVID-19  Is NOT requiring oxygen therapy or requiring an increase in baseline oxygen flow rate due to COVID-19  Is within 10 days of symptom onset  Has at least one of the high risk factor(s) for progression to severe COVID-19 and/or hospitalization as defined in EUA.  Specific high risk criteria : Diabetes and BMI >35   I have spoken and communicated the following to the patient or parent/caregiver:  1. FDA has authorized the emergency use of bamlanivimab/etesevimab and casirivimab\imdevimab for the treatment of mild to moderate COVID-19 in adults and pediatric patients with positive results of direct SARS-CoV-2 viral testing who are 42 years of age and older weighing at least 40 kg, and who are at high risk for progressing to severe COVID-19 and/or hospitalization.  2. The significant known and potential risks and benefits of bamlanivimab/etesevimab and casirivimab\imdevimab, and the extent to which such potential risks and benefits are unknown.  3. Information on available alternative treatments and the risks and benefits of those alternatives, including clinical trials.  4. Patients treated with bamlanivimab/etesevimab and casirivimab\imdevimab should continue to self-isolate and use infection control measures (e.g., wear mask, isolate, social distance, avoid sharing personal items, clean and disinfect "high touch" surfaces, and frequent handwashing) according to CDC guidelines.   5. The patient or  parent/caregiver has the option to accept or refuse bamlanivimab/etesevimab or casirivimab\imdevimab .  After reviewing this information with the patient, The patient agreed to proceed with receiving the bamlanivimab/etesevimab infusion and will be provided a copy of the Fact sheet prior to receiving the infusion..  Sx onset 4/21. Set up for infusion today at 12:30pm. Directions given and mychart message sent    Angelena Form 03/29/2020 8:12 AM

## 2020-03-29 NOTE — Progress Notes (Signed)
  Diagnosis: COVID-19  Physician:dr wright  Procedure: [facility] Med: bamlanivimab\etesevimab infusion - Provided patient with bamlanimivab\etesevimab fact sheet for patients, parents and caregivers prior to infusion.  Complications: No immediate complications noted.  Discharge: Discharged home   [place] 03/29/2020

## 2020-03-29 NOTE — Discharge Instructions (Signed)
COVID-19 COVID-19 is a respiratory infection that is caused by a virus called severe acute respiratory syndrome coronavirus 2 (SARS-CoV-2). The disease is also known as coronavirus disease or novel coronavirus. In some people, the virus may not cause any symptoms. In others, it may cause a serious infection. The infection can get worse quickly and can lead to complications, such as:  Pneumonia, or infection of the lungs.  Acute respiratory distress syndrome or ARDS. This is a condition in which fluid build-up in the lungs prevents the lungs from filling with air and passing oxygen into the blood.  Acute respiratory failure. This is a condition in which there is not enough oxygen passing from the lungs to the body or when carbon dioxide is not passing from the lungs out of the body.  Sepsis or septic shock. This is a serious bodily reaction to an infection.  Blood clotting problems.  Secondary infections due to bacteria or fungus.  Organ failure. This is when your body's organs stop working. The virus that causes COVID-19 is contagious. This means that it can spread from person to person through droplets from coughs and sneezes (respiratory secretions). What are the causes? This illness is caused by a virus. You may catch the virus by:  Breathing in droplets from an infected person. Droplets can be spread by a person breathing, speaking, singing, coughing, or sneezing.  Touching something, like a table or a doorknob, that was exposed to the virus (contaminated) and then touching your mouth, nose, or eyes. What increases the risk? Risk for infection You are more likely to be infected with this virus if you:  Are within 6 feet (2 meters) of a person with COVID-19.  Provide care for or live with a person who is infected with COVID-19.  Spend time in crowded indoor spaces or live in shared housing. Risk for serious illness You are more likely to become seriously ill from the virus if  you:  Are 50 years of age or older. The higher your age, the more you are at risk for serious illness.  Live in a nursing home or long-term care facility.  Have cancer.  Have a long-term (chronic) disease such as: ? Chronic lung disease, including chronic obstructive pulmonary disease or asthma. ? A long-term disease that lowers your body's ability to fight infection (immunocompromised). ? Heart disease, including heart failure, a condition in which the arteries that lead to the heart become narrow or blocked (coronary artery disease), a disease which makes the heart muscle thick, weak, or stiff (cardiomyopathy). ? Diabetes. ? Chronic kidney disease. ? Sickle cell disease, a condition in which red blood cells have an abnormal "sickle" shape. ? Liver disease.  Are obese. What are the signs or symptoms? Symptoms of this condition can range from mild to severe. Symptoms may appear any time from 2 to 14 days after being exposed to the virus. They include:  A fever or chills.  A cough.  Difficulty breathing.  Headaches, body aches, or muscle aches.  Runny or stuffy (congested) nose.  A sore throat.  New loss of taste or smell. Some people may also have stomach problems, such as nausea, vomiting, or diarrhea. Other people may not have any symptoms of COVID-19. How is this diagnosed? This condition may be diagnosed based on:  Your signs and symptoms, especially if: ? You live in an area with a COVID-19 outbreak. ? You recently traveled to or from an area where the virus is common. ? You   provide care for or live with a person who was diagnosed with COVID-19. ? You were exposed to a person who was diagnosed with COVID-19.  A physical exam.  Lab tests, which may include: ? Taking a sample of fluid from the back of your nose and throat (nasopharyngeal fluid), your nose, or your throat using a swab. ? A sample of mucus from your lungs (sputum). ? Blood tests.  Imaging tests,  which may include, X-rays, CT scan, or ultrasound. How is this treated? At present, there is no medicine to treat COVID-19. Medicines that treat other diseases are being used on a trial basis to see if they are effective against COVID-19. Your health care provider will talk with you about ways to treat your symptoms. For most people, the infection is mild and can be managed at home with rest, fluids, and over-the-counter medicines. Treatment for a serious infection usually takes places in a hospital intensive care unit (ICU). It may include one or more of the following treatments. These treatments are given until your symptoms improve.  Receiving fluids and medicines through an IV.  Supplemental oxygen. Extra oxygen is given through a tube in the nose, a face mask, or a hood.  Positioning you to lie on your stomach (prone position). This makes it easier for oxygen to get into the lungs.  Continuous positive airway pressure (CPAP) or bi-level positive airway pressure (BPAP) machine. This treatment uses mild air pressure to keep the airways open. A tube that is connected to a motor delivers oxygen to the body.  Ventilator. This treatment moves air into and out of the lungs by using a tube that is placed in your windpipe.  Tracheostomy. This is a procedure to create a hole in the neck so that a breathing tube can be inserted.  Extracorporeal membrane oxygenation (ECMO). This procedure gives the lungs a chance to recover by taking over the functions of the heart and lungs. It supplies oxygen to the body and removes carbon dioxide. Follow these instructions at home: Lifestyle  If you are sick, stay home except to get medical care. Your health care provider will tell you how long to stay home. Call your health care provider before you go for medical care.  Rest at home as told by your health care provider.  Do not use any products that contain nicotine or tobacco, such as cigarettes,  e-cigarettes, and chewing tobacco. If you need help quitting, ask your health care provider.  Return to your normal activities as told by your health care provider. Ask your health care provider what activities are safe for you. General instructions  Take over-the-counter and prescription medicines only as told by your health care provider.  Drink enough fluid to keep your urine pale yellow.  Keep all follow-up visits as told by your health care provider. This is important. How is this prevented?  There is no vaccine to help prevent COVID-19 infection. However, there are steps you can take to protect yourself and others from this virus. To protect yourself:   Do not travel to areas where COVID-19 is a risk. The areas where COVID-19 is reported change often. To identify high-risk areas and travel restrictions, check the CDC travel website: wwwnc.cdc.gov/travel/notices  If you live in, or must travel to, an area where COVID-19 is a risk, take precautions to avoid infection. ? Stay away from people who are sick. ? Wash your hands often with soap and water for 20 seconds. If soap and water   are not available, use an alcohol-based hand sanitizer. ? Avoid touching your mouth, face, eyes, or nose. ? Avoid going out in public, follow guidance from your state and local health authorities. ? If you must go out in public, wear a cloth face covering or face mask. Make sure your mask covers your nose and mouth. ? Avoid crowded indoor spaces. Stay at least 6 feet (2 meters) away from others. ? Disinfect objects and surfaces that are frequently touched every day. This may include:  Counters and tables.  Doorknobs and light switches.  Sinks and faucets.  Electronics, such as phones, remote controls, keyboards, computers, and tablets. To protect others: If you have symptoms of COVID-19, take steps to prevent the virus from spreading to others.  If you think you have a COVID-19 infection, contact  your health care provider right away. Tell your health care team that you think you may have a COVID-19 infection.  Stay home. Leave your house only to seek medical care. Do not use public transport.  Do not travel while you are sick.  Wash your hands often with soap and water for 20 seconds. If soap and water are not available, use alcohol-based hand sanitizer.  Stay away from other members of your household. Let healthy household members care for children and pets, if possible. If you have to care for children or pets, wash your hands often and wear a mask. If possible, stay in your own room, separate from others. Use a different bathroom.  Make sure that all people in your household wash their hands well and often.  Cough or sneeze into a tissue or your sleeve or elbow. Do not cough or sneeze into your hand or into the air.  Wear a cloth face covering or face mask. Make sure your mask covers your nose and mouth. Where to find more information  Centers for Disease Control and Prevention: www.cdc.gov/coronavirus/2019-ncov/index.html  World Health Organization: www.who.int/health-topics/coronavirus Contact a health care provider if:  You live in or have traveled to an area where COVID-19 is a risk and you have symptoms of the infection.  You have had contact with someone who has COVID-19 and you have symptoms of the infection. Get help right away if:  You have trouble breathing.  You have pain or pressure in your chest.  You have confusion.  You have bluish lips and fingernails.  You have difficulty waking from sleep.  You have symptoms that get worse. These symptoms may represent a serious problem that is an emergency. Do not wait to see if the symptoms will go away. Get medical help right away. Call your local emergency services (911 in the [place] Do not drive yourself to the hospital. Let the emergency medical personnel know if you think you have  COVID-19. Summary  COVID-19 is a respiratory infection that is caused by a virus. It is also known as coronavirus disease or novel coronavirus. It can cause serious infections, such as pneumonia, acute respiratory distress syndrome, acute respiratory failure, or sepsis.  The virus that causes COVID-19 is contagious. This means that it can spread from person to person through droplets from breathing, speaking, singing, coughing, or sneezing.  You are more likely to develop a serious illness if you are 50 years of age or older, have a weak immune system, live in a nursing home, or have chronic disease.  There is no medicine to treat COVID-19. Your health care provider will talk with you about ways to treat your symptoms.    Take steps to protect yourself and others from infection. Wash your hands often and disinfect objects and surfaces that are frequently touched every day. Stay away from people who are sick and wear a mask if you are sick. This information is not intended to replace advice given to you by your health care provider. Make sure you discuss any questions you have with your health care provider. Document Revised: 09/17/2019 Document Reviewed: 12/24/2018 Elsevier Patient Education  [place].

## 2020-04-13 ENCOUNTER — Ambulatory Visit: Payer: Medicaid Other | Admitting: Advanced Practice Midwife

## 2020-04-13 ENCOUNTER — Encounter: Payer: Self-pay | Admitting: Advanced Practice Midwife

## 2020-04-13 VITALS — BP 130/80 | HR 116 | Wt 224.0 lb

## 2020-04-13 DIAGNOSIS — Z03823 Encounter for observation for suspected inserted (injected) foreign body ruled out: Secondary | ICD-10-CM
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
## 2020-06-01 ENCOUNTER — Other Ambulatory Visit (HOSPITAL_COMMUNITY): Payer: Self-pay | Admitting: Obstetrics & Gynecology

## 2020-06-01 DIAGNOSIS — Z1231 Encounter for screening mammogram for malignant neoplasm of breast: Secondary | ICD-10-CM

## 2020-06-14 ENCOUNTER — Ambulatory Visit (HOSPITAL_COMMUNITY): Payer: Medicaid Other

## 2020-06-14 ENCOUNTER — Ambulatory Visit (HOSPITAL_COMMUNITY)
Admission: RE | Admit: 2020-06-14 | Discharge: 2020-06-14 | Disposition: A | Discharge status: Home / Self Care | Payer: Medicaid Other | Source: Ambulatory Visit | Attending: Obstetrics & Gynecology | Admitting: Obstetrics & Gynecology

## 2020-06-14 ENCOUNTER — Other Ambulatory Visit: Payer: Self-pay

## 2020-06-14 DIAGNOSIS — Z1231 Encounter for screening mammogram for malignant neoplasm of breast: Secondary | ICD-10-CM | POA: Diagnosis present

## 2020-06-29 ENCOUNTER — Telehealth (HOSPITAL_COMMUNITY): Payer: Self-pay | Admitting: *Deleted

## 2020-06-29 NOTE — Telephone Encounter (Signed)
Referral received and provider declined and suggested [facility]

## 2020-07-15 DIAGNOSIS — G459 Transient cerebral ischemic attack, unspecified: Secondary | ICD-10-CM | POA: Insufficient documentation

## 2020-07-18 ENCOUNTER — Other Ambulatory Visit: Payer: Self-pay

## 2020-07-18 ENCOUNTER — Emergency Department (HOSPITAL_COMMUNITY)
Admission: EM | Admit: 2020-07-18 | Discharge: 2020-07-18 | Disposition: A | Discharge status: Home / Self Care | Payer: Medicaid Other | Attending: Emergency Medicine | Admitting: Emergency Medicine

## 2020-07-18 DIAGNOSIS — N898 Other specified noninflammatory disorders of vagina: Secondary | ICD-10-CM | POA: Insufficient documentation

## 2020-07-18 DIAGNOSIS — B373 Candidiasis of vulva and vagina: Secondary | ICD-10-CM | POA: Diagnosis not present

## 2020-07-18 DIAGNOSIS — M549 Dorsalgia, unspecified: Secondary | ICD-10-CM | POA: Diagnosis not present

## 2020-07-18 DIAGNOSIS — G8929 Other chronic pain: Secondary | ICD-10-CM | POA: Insufficient documentation

## 2020-07-18 DIAGNOSIS — K219 Gastro-esophageal reflux disease without esophagitis: Secondary | ICD-10-CM | POA: Diagnosis not present

## 2020-07-18 DIAGNOSIS — I10 Essential (primary) hypertension: Secondary | ICD-10-CM | POA: Insufficient documentation

## 2020-07-18 DIAGNOSIS — N390 Urinary tract infection, site not specified: Secondary | ICD-10-CM | POA: Diagnosis present

## 2020-07-18 DIAGNOSIS — R21 Rash and other nonspecific skin eruption: Secondary | ICD-10-CM | POA: Diagnosis not present

## 2020-07-18 DIAGNOSIS — E119 Type 2 diabetes mellitus without complications: Secondary | ICD-10-CM | POA: Diagnosis not present

## 2020-07-18 DIAGNOSIS — Z794 Long term (current) use of insulin: Secondary | ICD-10-CM | POA: Insufficient documentation

## 2020-07-18 DIAGNOSIS — B3731 Acute candidiasis of vulva and vagina: Secondary | ICD-10-CM

## 2020-07-18 DIAGNOSIS — L282 Other prurigo: Secondary | ICD-10-CM

## 2020-07-18 LAB — COMPREHENSIVE METABOLIC PANEL
ALT: 19 U/L (ref 0–44)
AST: 17 U/L (ref 15–41)
Albumin: 3.6 g/dL (ref 3.5–5.0)
Alkaline Phosphatase: 89 U/L (ref 38–126)
Anion gap: 10 (ref 5–15)
BUN: 19 mg/dL (ref 6–20)
CO2: 19 mmol/L — ABNORMAL LOW (ref 22–32)
Calcium: 9 mg/dL (ref 8.9–10.3)
Chloride: 107 mmol/L (ref 98–111)
Creatinine, Ser: 0.92 mg/dL (ref 0.44–1.00)
GFR calc Af Amer: >60 mL/min (ref >60)
GFR calc non Af Amer: >60 mL/min (ref >60)
Glucose, Bld: 288 mg/dL — ABNORMAL HIGH (ref 70–99)
Potassium: 4.4 mmol/L (ref 3.5–5.1)
Sodium: 136 mmol/L (ref 135–145)
Total Bilirubin: 0.4 mg/dL (ref 0.3–1.2)
Total Protein: 7 g/dL (ref 6.5–8.1)

## 2020-07-18 LAB — CBC
HCT: 44.7 % (ref 36.0–46.0)
Hemoglobin: 14.1 g/dL (ref 12.0–15.0)
MCH: 27.5 pg (ref 26.0–34.0)
MCHC: 31.5 g/dL (ref 30.0–36.0)
MCV: 87.3 fL (ref 80.0–100.0)
Platelets: 282 10*3/uL (ref 150–400)
RBC: 5.12 MIL/uL — ABNORMAL HIGH (ref 3.87–5.11)
RDW: 14.1 % (ref 11.5–15.5)
WBC: 11.9 10*3/uL — ABNORMAL HIGH (ref 4.0–10.5)
nRBC: 0 % (ref 0.0–0.2)

## 2020-07-18 LAB — POC URINE PREG, ED: Preg Test, Ur: NEGATIVE

## 2020-07-18 LAB — URINALYSIS, ROUTINE W REFLEX MICROSCOPIC
Bacteria, UA: NONE SEEN
Bilirubin Urine: NEGATIVE
Glucose, UA: >=500 mg/dL — AB
Ketones, ur: NEGATIVE mg/dL
Nitrite: NEGATIVE
Protein, ur: NEGATIVE mg/dL
Specific Gravity, Urine: 1.032 — ABNORMAL HIGH (ref 1.005–1.030)
WBC, UA: >50 WBC/hpf — ABNORMAL HIGH (ref 0–5)
pH: 5 (ref 5.0–8.0)

## 2020-07-18 LAB — LIPASE, BLOOD: Lipase: 29 U/L (ref 11–51)

## 2020-07-18 MED ORDER — PREDNISONE 10 MG (21) PO TBPK
ORAL_TABLET | ORAL | 0 refills | Status: DC
Start: 2020-07-18 — End: 2020-08-11

## 2020-07-18 MED ORDER — HYDROXYZINE HCL 25 MG PO TABS
25.0000 mg | ORAL_TABLET | Freq: Four times a day (QID) | ORAL | 0 refills | Status: DC
Start: 2020-07-18 — End: 2020-08-11

## 2020-07-18 MED ORDER — FLUCONAZOLE 150 MG PO TABS
150.0000 mg | ORAL_TABLET | Freq: Once | ORAL | Status: AC
  Administered 2020-07-18: 150 mg via ORAL
  Filled 2020-07-18: qty 1

## 2020-07-18 MED ORDER — FLUCONAZOLE 150 MG PO TABS: 150.0000 mg | ORAL_TABLET | Freq: Once | ORAL | 0 refills | Status: AC

## 2020-07-18 MED ORDER — HYDROXYZINE HCL 25 MG PO TABS
25.0000 mg | ORAL_TABLET | Freq: Once | ORAL | Status: AC
  Administered 2020-07-18: 25 mg via ORAL
  Filled 2020-07-18: qty 1

## 2020-07-18 NOTE — ED Triage Notes (Signed)
[place], states she feels weak
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
## 2020-07-18 NOTE — Discharge Instructions (Signed)
°  If the symptoms of vaginal yeast infections do not resolve in 3 days, take the second dose of Diflucan.  Follow-up with OB/GYN for any further management of this issue.  Hydroxyzine: May use the hydroxyzine, as needed, for itching.  Use caution as hydroxyzine can make you drowsy.  Prednisone: Take the prednisone, as prescribed, until finished. If you are a diabetic, please know prednisone can raise your blood sugar temporarily.  Follow-up with the primary care provider for any further management or assessment of the rash.

## 2020-07-26 ENCOUNTER — Telehealth (HOSPITAL_COMMUNITY): Payer: Self-pay | Admitting: *Deleted

## 2020-07-26 NOTE — Telephone Encounter (Signed)
Patient called to sch appt due to referral that was sent here. Staff informed patient that provider reviewed referral and provider declined and suggested [facility]. Staff offered to provider [facility] number to patient and she stated she will look it up herself and then stated bye and call was ended.

## 2020-08-01 ENCOUNTER — Ambulatory Visit: Payer: Medicaid Other | Admitting: Obstetrics & Gynecology

## 2020-08-11 ENCOUNTER — Encounter: Payer: Self-pay | Admitting: Obstetrics & Gynecology

## 2020-08-11 ENCOUNTER — Ambulatory Visit: Payer: Medicaid Other | Admitting: Obstetrics & Gynecology

## 2020-08-11 VITALS — BP 124/85 | HR 93 | Ht 67.0 in | Wt 228.5 lb

## 2020-08-11 DIAGNOSIS — N3941 Urge incontinence: Secondary | ICD-10-CM

## 2020-08-11 DIAGNOSIS — B373 Candidiasis of vulva and vagina: Secondary | ICD-10-CM | POA: Diagnosis not present

## 2020-08-11 DIAGNOSIS — B3731 Acute candidiasis of vulva and vagina: Secondary | ICD-10-CM

## 2020-08-11 MED ORDER — FLUCONAZOLE 100 MG PO TABS
100.0000 mg | ORAL_TABLET | Freq: Every day | ORAL | 0 refills | Status: DC
Start: 2020-08-11 — End: 2020-09-22

## 2020-08-11 MED ORDER — MIRABEGRON ER 50 MG PO TB24: 50.0000 mg | ORAL_TABLET | Freq: Every day | ORAL | 11 refills | Status: DC
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
## 2020-08-23 ENCOUNTER — Telehealth: Payer: Self-pay

## 2020-08-23 NOTE — Telephone Encounter (Signed)
She did not comply with care previously, but will give her a chance with a new referral.

## 2020-08-23 NOTE — Telephone Encounter (Signed)
Patient left a VM that she is in dire of need of seeing you again. Her last office visit was 2017. She will need a new referral if you would like for me to schedule this. Please advise.

## 2020-08-23 NOTE — Telephone Encounter (Signed)
Pt.notified

## 2020-09-16 ENCOUNTER — Inpatient Hospital Stay (HOSPITAL_COMMUNITY): Payer: Medicaid Other

## 2020-09-16 ENCOUNTER — Other Ambulatory Visit: Payer: Self-pay

## 2020-09-16 ENCOUNTER — Emergency Department (HOSPITAL_COMMUNITY): Payer: Medicaid Other

## 2020-09-16 ENCOUNTER — Encounter (HOSPITAL_COMMUNITY): Payer: Self-pay

## 2020-09-16 ENCOUNTER — Inpatient Hospital Stay (HOSPITAL_COMMUNITY)
Admission: EM | Admit: 2020-09-16 | Discharge: 2020-09-22 | DRG: 871 | Disposition: A | Discharge status: Home / Self Care | Payer: Medicaid Other | Attending: Internal Medicine | Admitting: Internal Medicine

## 2020-09-16 DIAGNOSIS — A4151 Sepsis due to Escherichia coli [E. coli]: Secondary | ICD-10-CM | POA: Diagnosis not present

## 2020-09-16 DIAGNOSIS — Z7951 Long term (current) use of inhaled steroids: Secondary | ICD-10-CM

## 2020-09-16 DIAGNOSIS — N898 Other specified noninflammatory disorders of vagina: Secondary | ICD-10-CM | POA: Diagnosis not present

## 2020-09-16 DIAGNOSIS — Z79899 Other long term (current) drug therapy: Secondary | ICD-10-CM

## 2020-09-16 DIAGNOSIS — R0603 Acute respiratory distress: Secondary | ICD-10-CM | POA: Diagnosis not present

## 2020-09-16 DIAGNOSIS — A419 Sepsis, unspecified organism: Secondary | ICD-10-CM | POA: Diagnosis present

## 2020-09-16 DIAGNOSIS — Z818 Family history of other mental and behavioral disorders: Secondary | ICD-10-CM

## 2020-09-16 DIAGNOSIS — E876 Hypokalemia: Secondary | ICD-10-CM | POA: Diagnosis present

## 2020-09-16 DIAGNOSIS — E1110 Type 2 diabetes mellitus with ketoacidosis without coma: Secondary | ICD-10-CM

## 2020-09-16 DIAGNOSIS — I10 Essential (primary) hypertension: Secondary | ICD-10-CM | POA: Diagnosis not present

## 2020-09-16 DIAGNOSIS — Z23 Encounter for immunization: Secondary | ICD-10-CM

## 2020-09-16 DIAGNOSIS — Z8249 Family history of ischemic heart disease and other diseases of the circulatory system: Secondary | ICD-10-CM

## 2020-09-16 DIAGNOSIS — G4733 Obstructive sleep apnea (adult) (pediatric): Secondary | ICD-10-CM | POA: Diagnosis present

## 2020-09-16 DIAGNOSIS — E669 Obesity, unspecified: Secondary | ICD-10-CM | POA: Diagnosis present

## 2020-09-16 DIAGNOSIS — L89892 Pressure ulcer of other site, stage 2: Secondary | ICD-10-CM | POA: Diagnosis present

## 2020-09-16 DIAGNOSIS — K219 Gastro-esophageal reflux disease without esophagitis: Secondary | ICD-10-CM | POA: Diagnosis present

## 2020-09-16 DIAGNOSIS — F319 Bipolar disorder, unspecified: Secondary | ICD-10-CM | POA: Diagnosis present

## 2020-09-16 DIAGNOSIS — R6520 Severe sepsis without septic shock: Secondary | ICD-10-CM | POA: Diagnosis present

## 2020-09-16 DIAGNOSIS — R197 Diarrhea, unspecified: Secondary | ICD-10-CM | POA: Diagnosis not present

## 2020-09-16 DIAGNOSIS — J9691 Respiratory failure, unspecified with hypoxia: Secondary | ICD-10-CM

## 2020-09-16 DIAGNOSIS — I5181 Takotsubo syndrome: Secondary | ICD-10-CM | POA: Diagnosis not present

## 2020-09-16 DIAGNOSIS — N390 Urinary tract infection, site not specified: Secondary | ICD-10-CM | POA: Diagnosis present

## 2020-09-16 DIAGNOSIS — G894 Chronic pain syndrome: Secondary | ICD-10-CM | POA: Diagnosis present

## 2020-09-16 DIAGNOSIS — Z20822 Contact with and (suspected) exposure to covid-19: Secondary | ICD-10-CM | POA: Diagnosis present

## 2020-09-16 DIAGNOSIS — I5021 Acute systolic (congestive) heart failure: Secondary | ICD-10-CM

## 2020-09-16 DIAGNOSIS — Z888 Allergy status to other drugs, medicaments and biological substances status: Secondary | ICD-10-CM

## 2020-09-16 DIAGNOSIS — I2510 Atherosclerotic heart disease of native coronary artery without angina pectoris: Secondary | ICD-10-CM | POA: Diagnosis present

## 2020-09-16 DIAGNOSIS — F419 Anxiety disorder, unspecified: Secondary | ICD-10-CM | POA: Diagnosis present

## 2020-09-16 DIAGNOSIS — I429 Cardiomyopathy, unspecified: Secondary | ICD-10-CM | POA: Diagnosis not present

## 2020-09-16 DIAGNOSIS — R0902 Hypoxemia: Secondary | ICD-10-CM

## 2020-09-16 DIAGNOSIS — F603 Borderline personality disorder: Secondary | ICD-10-CM | POA: Diagnosis present

## 2020-09-16 DIAGNOSIS — E785 Hyperlipidemia, unspecified: Secondary | ICD-10-CM | POA: Diagnosis present

## 2020-09-16 DIAGNOSIS — Z9851 Tubal ligation status: Secondary | ICD-10-CM

## 2020-09-16 DIAGNOSIS — J9601 Acute respiratory failure with hypoxia: Secondary | ICD-10-CM | POA: Diagnosis present

## 2020-09-16 DIAGNOSIS — F410 Panic disorder [episodic paroxysmal anxiety] without agoraphobia: Secondary | ICD-10-CM | POA: Diagnosis present

## 2020-09-16 DIAGNOSIS — Z6836 Body mass index (BMI) 36.0-36.9, adult: Secondary | ICD-10-CM

## 2020-09-16 DIAGNOSIS — I214 Non-ST elevation (NSTEMI) myocardial infarction: Secondary | ICD-10-CM | POA: Diagnosis present

## 2020-09-16 DIAGNOSIS — I471 Supraventricular tachycardia: Secondary | ICD-10-CM | POA: Diagnosis present

## 2020-09-16 DIAGNOSIS — E11649 Type 2 diabetes mellitus with hypoglycemia without coma: Secondary | ICD-10-CM

## 2020-09-16 DIAGNOSIS — N179 Acute kidney failure, unspecified: Secondary | ICD-10-CM | POA: Diagnosis present

## 2020-09-16 DIAGNOSIS — I110 Hypertensive heart disease with heart failure: Secondary | ICD-10-CM | POA: Diagnosis present

## 2020-09-16 DIAGNOSIS — I5032 Chronic diastolic (congestive) heart failure: Secondary | ICD-10-CM

## 2020-09-16 DIAGNOSIS — I4519 Other right bundle-branch block: Secondary | ICD-10-CM | POA: Diagnosis present

## 2020-09-16 DIAGNOSIS — E119 Type 2 diabetes mellitus without complications: Secondary | ICD-10-CM | POA: Diagnosis present

## 2020-09-16 DIAGNOSIS — Z833 Family history of diabetes mellitus: Secondary | ICD-10-CM

## 2020-09-16 DIAGNOSIS — Z882 Allergy status to sulfonamides status: Secondary | ICD-10-CM

## 2020-09-16 DIAGNOSIS — Z91138 Patient's unintentional underdosing of medication regimen for other reason: Secondary | ICD-10-CM

## 2020-09-16 DIAGNOSIS — J189 Pneumonia, unspecified organism: Secondary | ICD-10-CM | POA: Diagnosis present

## 2020-09-16 DIAGNOSIS — R7881 Bacteremia: Secondary | ICD-10-CM | POA: Diagnosis not present

## 2020-09-16 DIAGNOSIS — T383X6A Underdosing of insulin and oral hypoglycemic [antidiabetic] drugs, initial encounter: Secondary | ICD-10-CM | POA: Diagnosis present

## 2020-09-16 DIAGNOSIS — B9620 Unspecified Escherichia coli [E. coli] as the cause of diseases classified elsewhere: Secondary | ICD-10-CM | POA: Diagnosis not present

## 2020-09-16 DIAGNOSIS — E1165 Type 2 diabetes mellitus with hyperglycemia: Secondary | ICD-10-CM | POA: Diagnosis present

## 2020-09-16 DIAGNOSIS — IMO0002 Reserved for concepts with insufficient information to code with codable children: Secondary | ICD-10-CM | POA: Diagnosis present

## 2020-09-16 DIAGNOSIS — Z9049 Acquired absence of other specified parts of digestive tract: Secondary | ICD-10-CM

## 2020-09-16 DIAGNOSIS — R778 Other specified abnormalities of plasma proteins: Secondary | ICD-10-CM | POA: Diagnosis not present

## 2020-09-16 DIAGNOSIS — Z794 Long term (current) use of insulin: Secondary | ICD-10-CM

## 2020-09-16 LAB — HIV ANTIBODY (ROUTINE TESTING W REFLEX): HIV Screen 4th Generation wRfx: NONREACTIVE

## 2020-09-16 LAB — TRIGLYCERIDES: Triglycerides: 784 mg/dL — ABNORMAL HIGH (ref <150)

## 2020-09-16 LAB — CBC WITH DIFFERENTIAL/PLATELET
Abs Immature Granulocytes: 0.24 10*3/uL — ABNORMAL HIGH (ref 0.00–0.07)
Basophils Absolute: 0.1 10*3/uL (ref 0.0–0.1)
Basophils Relative: 1 %
Eosinophils Absolute: 0.1 10*3/uL (ref 0.0–0.5)
Eosinophils Relative: 1 %
HCT: 48.2 % — ABNORMAL HIGH (ref 36.0–46.0)
Hemoglobin: 15 g/dL (ref 12.0–15.0)
Immature Granulocytes: 2 %
Lymphocytes Relative: 53 %
Lymphs Abs: 6.1 10*3/uL — ABNORMAL HIGH (ref 0.7–4.0)
MCH: 27.6 pg (ref 26.0–34.0)
MCHC: 31.1 g/dL (ref 30.0–36.0)
MCV: 88.8 fL (ref 80.0–100.0)
Monocytes Absolute: 0.2 10*3/uL (ref 0.1–1.0)
Monocytes Relative: 2 %
Neutro Abs: 4.8 10*3/uL (ref 1.7–7.7)
Neutrophils Relative %: 41 %
Platelets: 291 10*3/uL (ref 150–400)
RBC: 5.43 MIL/uL — ABNORMAL HIGH (ref 3.87–5.11)
RDW: 15.2 % (ref 11.5–15.5)
WBC: 11.3 10*3/uL — ABNORMAL HIGH (ref 4.0–10.5)
nRBC: 0.8 % — ABNORMAL HIGH (ref 0.0–0.2)

## 2020-09-16 LAB — MRSA PCR SCREENING: MRSA by PCR: NEGATIVE

## 2020-09-16 LAB — BASIC METABOLIC PANEL
Anion gap: 17 — ABNORMAL HIGH (ref 5–15)
Anion gap: 14 (ref 5–15)
Anion gap: 12 (ref 5–15)
Anion gap: 12 (ref 5–15)
Anion gap: 9 (ref 5–15)
Anion gap: 11 (ref 5–15)
BUN: 20 mg/dL (ref 6–20)
BUN: 23 mg/dL — ABNORMAL HIGH (ref 6–20)
BUN: 23 mg/dL — ABNORMAL HIGH (ref 6–20)
BUN: 19 mg/dL (ref 6–20)
BUN: 20 mg/dL (ref 6–20)
BUN: 18 mg/dL (ref 6–20)
CO2: 15 mmol/L — ABNORMAL LOW (ref 22–32)
CO2: 17 mmol/L — ABNORMAL LOW (ref 22–32)
CO2: 17 mmol/L — ABNORMAL LOW (ref 22–32)
CO2: 18 mmol/L — ABNORMAL LOW (ref 22–32)
CO2: 20 mmol/L — ABNORMAL LOW (ref 22–32)
CO2: 20 mmol/L — ABNORMAL LOW (ref 22–32)
Calcium: 9.1 mg/dL (ref 8.9–10.3)
Calcium: 8.1 mg/dL — ABNORMAL LOW (ref 8.9–10.3)
Calcium: 8.3 mg/dL — ABNORMAL LOW (ref 8.9–10.3)
Calcium: 6.7 mg/dL — ABNORMAL LOW (ref 8.9–10.3)
Calcium: 7.9 mg/dL — ABNORMAL LOW (ref 8.9–10.3)
Calcium: 8.2 mg/dL — ABNORMAL LOW (ref 8.9–10.3)
Chloride: 101 mmol/L (ref 98–111)
Chloride: 105 mmol/L (ref 98–111)
Chloride: 107 mmol/L (ref 98–111)
Chloride: 111 mmol/L (ref 98–111)
Chloride: 109 mmol/L (ref 98–111)
Chloride: 107 mmol/L (ref 98–111)
Creatinine, Ser: 1.39 mg/dL — ABNORMAL HIGH (ref 0.44–1.00)
Creatinine, Ser: 1.33 mg/dL — ABNORMAL HIGH (ref 0.44–1.00)
Creatinine, Ser: 1.3 mg/dL — ABNORMAL HIGH (ref 0.44–1.00)
Creatinine, Ser: 0.94 mg/dL (ref 0.44–1.00)
Creatinine, Ser: 1.08 mg/dL — ABNORMAL HIGH (ref 0.44–1.00)
Creatinine, Ser: 0.95 mg/dL (ref 0.44–1.00)
GFR, Estimated: 45 mL/min — ABNORMAL LOW (ref >60)
GFR, Estimated: 47 mL/min — ABNORMAL LOW (ref >60)
GFR, Estimated: 49 mL/min — ABNORMAL LOW (ref >60)
GFR, Estimated: >60 mL/min (ref >60)
GFR, Estimated: >60 mL/min (ref >60)
GFR, Estimated: >60 mL/min (ref >60)
Glucose, Bld: 533 mg/dL (ref 70–99)
Glucose, Bld: 466 mg/dL — ABNORMAL HIGH (ref 70–99)
Glucose, Bld: 291 mg/dL — ABNORMAL HIGH (ref 70–99)
Glucose, Bld: 196 mg/dL — ABNORMAL HIGH (ref 70–99)
Glucose, Bld: 207 mg/dL — ABNORMAL HIGH (ref 70–99)
Glucose, Bld: 145 mg/dL — ABNORMAL HIGH (ref 70–99)
Potassium: 3.9 mmol/L (ref 3.5–5.1)
Potassium: 3.9 mmol/L (ref 3.5–5.1)
Potassium: 3.3 mmol/L — ABNORMAL LOW (ref 3.5–5.1)
Potassium: 3.2 mmol/L — ABNORMAL LOW (ref 3.5–5.1)
Potassium: 3.3 mmol/L — ABNORMAL LOW (ref 3.5–5.1)
Potassium: 2.9 mmol/L — ABNORMAL LOW (ref 3.5–5.1)
Sodium: 133 mmol/L — ABNORMAL LOW (ref 135–145)
Sodium: 136 mmol/L (ref 135–145)
Sodium: 136 mmol/L (ref 135–145)
Sodium: 141 mmol/L (ref 135–145)
Sodium: 138 mmol/L (ref 135–145)
Sodium: 138 mmol/L (ref 135–145)

## 2020-09-16 LAB — CBG MONITORING, ED
Glucose-Capillary: 556 mg/dL (ref 70–99)
Glucose-Capillary: 477 mg/dL — ABNORMAL HIGH (ref 70–99)
Glucose-Capillary: 481 mg/dL — ABNORMAL HIGH (ref 70–99)
Glucose-Capillary: 390 mg/dL — ABNORMAL HIGH (ref 70–99)
Glucose-Capillary: 302 mg/dL — ABNORMAL HIGH (ref 70–99)
Glucose-Capillary: 278 mg/dL — ABNORMAL HIGH (ref 70–99)

## 2020-09-16 LAB — POC URINE PREG, ED: Preg Test, Ur: NEGATIVE

## 2020-09-16 LAB — ECHOCARDIOGRAM COMPLETE
Area-P 1/2: 6.22 cm2
Height: 67 in
S' Lateral: 2.61 cm
Weight: 3784.86 oz

## 2020-09-16 LAB — COMPREHENSIVE METABOLIC PANEL
ALT: 65 U/L — ABNORMAL HIGH (ref 0–44)
AST: 73 U/L — ABNORMAL HIGH (ref 15–41)
Albumin: 3.6 g/dL (ref 3.5–5.0)
Alkaline Phosphatase: 107 U/L (ref 38–126)
Anion gap: 23 — ABNORMAL HIGH (ref 5–15)
BUN: 18 mg/dL (ref 6–20)
CO2: 15 mmol/L — ABNORMAL LOW (ref 22–32)
Calcium: 9.3 mg/dL (ref 8.9–10.3)
Chloride: 97 mmol/L — ABNORMAL LOW (ref 98–111)
Creatinine, Ser: 1.37 mg/dL — ABNORMAL HIGH (ref 0.44–1.00)
GFR, Estimated: 46 mL/min — ABNORMAL LOW (ref >60)
Glucose, Bld: 494 mg/dL — ABNORMAL HIGH (ref 70–99)
Potassium: 3.8 mmol/L (ref 3.5–5.1)
Sodium: 135 mmol/L (ref 135–145)
Total Bilirubin: 0.9 mg/dL (ref 0.3–1.2)
Total Protein: 7.6 g/dL (ref 6.5–8.1)

## 2020-09-16 LAB — URINALYSIS, ROUTINE W REFLEX MICROSCOPIC
Bilirubin Urine: NEGATIVE
Glucose, UA: >=500 mg/dL — AB
Ketones, ur: NEGATIVE mg/dL
Nitrite: POSITIVE — AB
Protein, ur: NEGATIVE mg/dL
Specific Gravity, Urine: 1.015 (ref 1.005–1.030)
WBC, UA: >50 WBC/hpf — ABNORMAL HIGH (ref 0–5)
pH: 6 (ref 5.0–8.0)

## 2020-09-16 LAB — GLUCOSE, CAPILLARY
Glucose-Capillary: 213 mg/dL — ABNORMAL HIGH (ref 70–99)
Glucose-Capillary: 224 mg/dL — ABNORMAL HIGH (ref 70–99)
Glucose-Capillary: 212 mg/dL — ABNORMAL HIGH (ref 70–99)
Glucose-Capillary: 195 mg/dL — ABNORMAL HIGH (ref 70–99)
Glucose-Capillary: 196 mg/dL — ABNORMAL HIGH (ref 70–99)
Glucose-Capillary: 140 mg/dL — ABNORMAL HIGH (ref 70–99)
Glucose-Capillary: 142 mg/dL — ABNORMAL HIGH (ref 70–99)

## 2020-09-16 LAB — RESPIRATORY PANEL BY RT PCR (FLU A&B, COVID)
Influenza A by PCR: NEGATIVE
Influenza A by PCR: NEGATIVE
Influenza B by PCR: NEGATIVE
Influenza B by PCR: NEGATIVE
SARS Coronavirus 2 by RT PCR: NEGATIVE
SARS Coronavirus 2 by RT PCR: NEGATIVE

## 2020-09-16 LAB — TROPONIN I (HIGH SENSITIVITY)
Troponin I (High Sensitivity): 38 ng/L — ABNORMAL HIGH (ref <18)
Troponin I (High Sensitivity): 771 ng/L (ref <18)
Troponin I (High Sensitivity): 1290 ng/L (ref <18)
Troponin I (High Sensitivity): 1884 ng/L (ref <18)
Troponin I (High Sensitivity): 2445 ng/L (ref <18)
Troponin I (High Sensitivity): 2886 ng/L (ref <18)

## 2020-09-16 LAB — LACTIC ACID, PLASMA
Lactic Acid, Venous: 4.7 mmol/L (ref 0.5–1.9)
Lactic Acid, Venous: 4.6 mmol/L (ref 0.5–1.9)
Lactic Acid, Venous: 2.8 mmol/L (ref 0.5–1.9)

## 2020-09-16 LAB — HEPARIN LEVEL (UNFRACTIONATED)
Heparin Unfractionated: 0.31 IU/mL (ref 0.30–0.70)
Heparin Unfractionated: 0.22 IU/mL — ABNORMAL LOW (ref 0.30–0.70)

## 2020-09-16 LAB — BLOOD GAS, VENOUS
Acid-base deficit: 9.1 mmol/L — ABNORMAL HIGH (ref 0.0–2.0)
Acid-base deficit: 4.7 mmol/L — ABNORMAL HIGH (ref 0.0–2.0)
Bicarbonate: 17.3 mmol/L — ABNORMAL LOW (ref 20.0–28.0)
Bicarbonate: 19.9 mmol/L — ABNORMAL LOW (ref 20.0–28.0)
FIO2: 80
FIO2: 32
O2 Saturation: 94.4 %
O2 Saturation: 53.2 %
Patient temperature: 40
Patient temperature: 38.3
pCO2, Ven: 39.7 mmHg — ABNORMAL LOW (ref 44.0–60.0)
pCO2, Ven: 34.6 mmHg — ABNORMAL LOW (ref 44.0–60.0)
pH, Ven: 7.253 (ref 7.250–7.430)
pH, Ven: 7.372 (ref 7.250–7.430)
pO2, Ven: 101 mmHg — ABNORMAL HIGH (ref 32.0–45.0)
pO2, Ven: 38.2 mmHg (ref 32.0–45.0)

## 2020-09-16 LAB — BETA-HYDROXYBUTYRIC ACID
Beta-Hydroxybutyric Acid: 0.38 mmol/L — ABNORMAL HIGH (ref 0.05–0.27)
Beta-Hydroxybutyric Acid: 0.32 mmol/L — ABNORMAL HIGH (ref 0.05–0.27)
Beta-Hydroxybutyric Acid: 0.43 mmol/L — ABNORMAL HIGH (ref 0.05–0.27)

## 2020-09-16 LAB — PROCALCITONIN: Procalcitonin: 12.79 ng/mL

## 2020-09-16 LAB — BRAIN NATRIURETIC PEPTIDE: B Natriuretic Peptide: 110 pg/mL — ABNORMAL HIGH (ref 0.0–100.0)

## 2020-09-16 LAB — LACTATE DEHYDROGENASE: LDH: 252 U/L — ABNORMAL HIGH (ref 98–192)

## 2020-09-16 LAB — C-REACTIVE PROTEIN: CRP: 24.8 mg/dL — ABNORMAL HIGH (ref <1.0)

## 2020-09-16 LAB — FIBRINOGEN: Fibrinogen: 655 mg/dL — ABNORMAL HIGH (ref 210–475)

## 2020-09-16 LAB — FERRITIN: Ferritin: 70 ng/mL (ref 11–307)

## 2020-09-16 LAB — D-DIMER, QUANTITATIVE: D-Dimer, Quant: 9.53 ug/mL-FEU — ABNORMAL HIGH (ref 0.00–0.50)

## 2020-09-16 MED ORDER — PREGABALIN 100 MG PO CAPS
300.0000 mg | ORAL_CAPSULE | Freq: Two times a day (BID) | ORAL | Status: DC
  Administered 2020-09-16 – 2020-09-22 (×13): 300 mg via ORAL
  Filled 2020-09-16 (×3): qty 4
  Filled 2020-09-16: qty 6
  Filled 2020-09-16: qty 3
  Filled 2020-09-16 (×4): qty 4
  Filled 2020-09-16: qty 3
  Filled 2020-09-16: qty 4
  Filled 2020-09-16 (×2): qty 3

## 2020-09-16 MED ORDER — SODIUM CHLORIDE 0.9 % IV SOLN
1.0000 g | Freq: Once | INTRAVENOUS | Status: AC
  Administered 2020-09-16: 1 g via INTRAVENOUS
  Filled 2020-09-16: qty 10

## 2020-09-16 MED ORDER — SODIUM CHLORIDE 0.9 % IV SOLN
500.0000 mg | Freq: Once | INTRAVENOUS | Status: AC
  Administered 2020-09-16: 500 mg via INTRAVENOUS
  Filled 2020-09-16: qty 500

## 2020-09-16 MED ORDER — SODIUM CHLORIDE 0.9 % IV SOLN
600.0000 mg | Freq: Once | INTRAVENOUS | Status: AC
  Administered 2020-09-16: 600 mg via INTRAVENOUS
  Filled 2020-09-16: qty 6

## 2020-09-16 MED ORDER — OXYCODONE HCL 5 MG PO TABS
5.0000 mg | ORAL_TABLET | ORAL | Status: DC | PRN
  Administered 2020-09-16 – 2020-09-20 (×7): 5 mg via ORAL
  Filled 2020-09-16 (×7): qty 1

## 2020-09-16 MED ORDER — SODIUM CHLORIDE 0.9 % IV BOLUS
1000.0000 mL | Freq: Once | INTRAVENOUS | Status: AC
  Administered 2020-09-16: 1000 mL via INTRAVENOUS

## 2020-09-16 MED ORDER — ONDANSETRON HCL 4 MG PO TABS: 4.0000 mg | ORAL_TABLET | Freq: Four times a day (QID) | ORAL | Status: DC | PRN

## 2020-09-16 MED ORDER — MIRABEGRON ER 25 MG PO TB24
50.0000 mg | ORAL_TABLET | Freq: Every day | ORAL | Status: DC
  Administered 2020-09-16 – 2020-09-21 (×6): 50 mg via ORAL
  Filled 2020-09-16 (×7): qty 2

## 2020-09-16 MED ORDER — PAROXETINE HCL 20 MG PO TABS
60.0000 mg | ORAL_TABLET | Freq: Every day | ORAL | Status: DC
  Administered 2020-09-16 – 2020-09-22 (×7): 60 mg via ORAL
  Filled 2020-09-16 (×7): qty 3

## 2020-09-16 MED ORDER — INFLUENZA VAC SPLIT QUAD 0.5 ML IM SUSY: 0.5000 mL | PREFILLED_SYRINGE | INTRAMUSCULAR | Status: DC

## 2020-09-16 MED ORDER — QUETIAPINE FUMARATE 50 MG PO TABS
100.0000 mg | ORAL_TABLET | Freq: Every day | ORAL | Status: DC
  Administered 2020-09-16 – 2020-09-21 (×6): 100 mg via ORAL
  Filled 2020-09-16 (×2): qty 1
  Filled 2020-09-16: qty 2
  Filled 2020-09-16 (×2): qty 1
  Filled 2020-09-16: qty 2

## 2020-09-16 MED ORDER — DEXTROSE 50 % IV SOLN: 0.0000 mL | INTRAVENOUS | Status: DC | PRN

## 2020-09-16 MED ORDER — LACTATED RINGERS IV BOLUS
20.0000 mL/kg | Freq: Once | INTRAVENOUS | Status: AC
  Administered 2020-09-16: 2100 mL via INTRAVENOUS

## 2020-09-16 MED ORDER — LACTATED RINGERS IV SOLN: INTRAVENOUS | Status: DC

## 2020-09-16 MED ORDER — POTASSIUM CHLORIDE CRYS ER 20 MEQ PO TBCR
40.0000 meq | EXTENDED_RELEASE_TABLET | ORAL | Status: AC
  Administered 2020-09-16 – 2020-09-17 (×2): 40 meq via ORAL
  Filled 2020-09-16 (×2): qty 2

## 2020-09-16 MED ORDER — BUSPIRONE HCL 5 MG PO TABS
15.0000 mg | ORAL_TABLET | Freq: Two times a day (BID) | ORAL | Status: DC
  Administered 2020-09-16 – 2020-09-22 (×13): 15 mg via ORAL
  Filled 2020-09-16 (×13): qty 3

## 2020-09-16 MED ORDER — ACETAMINOPHEN 40 MG HALF SUPP: 1000.0000 mg | Freq: Once | RECTAL | Status: DC

## 2020-09-16 MED ORDER — INSULIN REGULAR(HUMAN) IN NACL 100-0.9 UT/100ML-% IV SOLN
INTRAVENOUS | Status: DC
  Administered 2020-09-16: 6 [IU]/h via INTRAVENOUS
  Administered 2020-09-16: 13 [IU]/h via INTRAVENOUS
  Filled 2020-09-16 (×2): qty 100

## 2020-09-16 MED ORDER — SODIUM CHLORIDE 0.9 % IV SOLN
1.0000 g | INTRAVENOUS | Status: DC
  Administered 2020-09-17: 1 g via INTRAVENOUS
  Filled 2020-09-16: qty 10

## 2020-09-16 MED ORDER — HEPARIN (PORCINE) 25000 UT/250ML-% IV SOLN
2500.0000 [IU]/h | INTRAVENOUS | Status: DC
  Administered 2020-09-16 (×2): 1200 [IU]/h via INTRAVENOUS
  Administered 2020-09-17: 1600 [IU]/h via INTRAVENOUS
  Administered 2020-09-18: 2350 [IU]/h via INTRAVENOUS
  Administered 2020-09-18: 2000 [IU]/h via INTRAVENOUS
  Administered 2020-09-19 – 2020-09-20 (×3): 2500 [IU]/h via INTRAVENOUS
  Filled 2020-09-16 (×8): qty 250

## 2020-09-16 MED ORDER — VANCOMYCIN HCL 2000 MG/400ML IV SOLN
2000.0000 mg | Freq: Once | INTRAVENOUS | Status: AC
  Administered 2020-09-16: 2000 mg via INTRAVENOUS
  Filled 2020-09-16: qty 400

## 2020-09-16 MED ORDER — ACETAMINOPHEN 650 MG RE SUPP
975.0000 mg | RECTAL | Status: AC
  Administered 2020-09-16: 975 mg via RECTAL

## 2020-09-16 MED ORDER — ACETAMINOPHEN 325 MG PO TABS
650.0000 mg | ORAL_TABLET | Freq: Four times a day (QID) | ORAL | Status: DC | PRN
  Administered 2020-09-16 – 2020-09-17 (×2): 650 mg via ORAL
  Filled 2020-09-16 (×2): qty 2

## 2020-09-16 MED ORDER — FUROSEMIDE 10 MG/ML IJ SOLN
INTRAMUSCULAR | Status: AC
  Filled 2020-09-16: qty 8

## 2020-09-16 MED ORDER — HEPARIN SODIUM (PORCINE) 5000 UNIT/ML IJ SOLN
5000.0000 [IU] | Freq: Three times a day (TID) | INTRAMUSCULAR | Status: DC
  Administered 2020-09-16: 5000 [IU] via SUBCUTANEOUS
  Filled 2020-09-16: qty 1

## 2020-09-16 MED ORDER — ALBUTEROL SULFATE (2.5 MG/3ML) 0.083% IN NEBU
2.5000 mg | INHALATION_SOLUTION | Freq: Four times a day (QID) | RESPIRATORY_TRACT | Status: DC
  Administered 2020-09-16 (×3): 2.5 mg via RESPIRATORY_TRACT
  Filled 2020-09-16 (×3): qty 3

## 2020-09-16 MED ORDER — ONDANSETRON HCL 4 MG/2ML IJ SOLN: 4.0000 mg | Freq: Four times a day (QID) | INTRAMUSCULAR | Status: DC | PRN

## 2020-09-16 MED ORDER — DILTIAZEM LOAD VIA INFUSION
10.0000 mg | Freq: Once | INTRAVENOUS | Status: AC
  Administered 2020-09-16: 10 mg via INTRAVENOUS
  Filled 2020-09-16: qty 10

## 2020-09-16 MED ORDER — POLYETHYLENE GLYCOL 3350 17 G PO PACK: 17.0000 g | PACK | Freq: Every day | ORAL | Status: DC | PRN

## 2020-09-16 MED ORDER — INSULIN ASPART 100 UNIT/ML ~~LOC~~ SOLN
0.0000 [IU] | Freq: Three times a day (TID) | SUBCUTANEOUS | Status: DC
  Administered 2020-09-17: 7 [IU] via SUBCUTANEOUS
  Administered 2020-09-17 (×2): 4 [IU] via SUBCUTANEOUS
  Administered 2020-09-18 – 2020-09-21 (×3): 3 [IU] via SUBCUTANEOUS

## 2020-09-16 MED ORDER — POTASSIUM CHLORIDE 10 MEQ/100ML IV SOLN
10.0000 meq | INTRAVENOUS | Status: AC
  Administered 2020-09-16 (×2): 10 meq via INTRAVENOUS
  Filled 2020-09-16 (×2): qty 100

## 2020-09-16 MED ORDER — IOHEXOL 350 MG/ML SOLN
100.0000 mL | Freq: Once | INTRAVENOUS | Status: AC | PRN
  Administered 2020-09-16: 75 mL via INTRAVENOUS

## 2020-09-16 MED ORDER — HEPARIN BOLUS VIA INFUSION
4000.0000 [IU] | Freq: Once | INTRAVENOUS | Status: AC
  Administered 2020-09-16: 4000 [IU] via INTRAVENOUS

## 2020-09-16 MED ORDER — PANTOPRAZOLE SODIUM 40 MG PO TBEC
40.0000 mg | DELAYED_RELEASE_TABLET | Freq: Every day | ORAL | Status: DC
  Administered 2020-09-16 – 2020-09-22 (×7): 40 mg via ORAL
  Filled 2020-09-16 (×7): qty 1

## 2020-09-16 MED ORDER — ATENOLOL 25 MG PO TABS: 25.0000 mg | ORAL_TABLET | Freq: Every day | ORAL | Status: DC

## 2020-09-16 MED ORDER — ACETAMINOPHEN 650 MG RE SUPP: 650.0000 mg | Freq: Four times a day (QID) | RECTAL | Status: DC | PRN

## 2020-09-16 MED ORDER — FUROSEMIDE 10 MG/ML IJ SOLN
60.0000 mg | Freq: Once | INTRAMUSCULAR | Status: AC
  Administered 2020-09-16: 60 mg via INTRAMUSCULAR

## 2020-09-16 MED ORDER — CHLORHEXIDINE GLUCONATE CLOTH 2 % EX PADS
6.0000 | MEDICATED_PAD | Freq: Every day | CUTANEOUS | Status: DC
  Administered 2020-09-16 – 2020-09-21 (×6): 6 via TOPICAL

## 2020-09-16 MED ORDER — PNEUMOCOCCAL VAC POLYVALENT 25 MCG/0.5ML IJ INJ: 0.5000 mL | INJECTION | INTRAMUSCULAR | Status: DC

## 2020-09-16 MED ORDER — ALBUTEROL SULFATE (2.5 MG/3ML) 0.083% IN NEBU
2.5000 mg | INHALATION_SOLUTION | Freq: Three times a day (TID) | RESPIRATORY_TRACT | Status: DC
  Administered 2020-09-17 – 2020-09-20 (×10): 2.5 mg via RESPIRATORY_TRACT
  Filled 2020-09-16 (×11): qty 3

## 2020-09-16 MED ORDER — FESOTERODINE FUMARATE ER 8 MG PO TB24: 8.0000 mg | ORAL_TABLET | Freq: Every day | ORAL | Status: DC

## 2020-09-16 MED ORDER — CLONAZEPAM 0.25 MG PO TBDP
0.2500 mg | ORAL_TABLET | Freq: Two times a day (BID) | ORAL | Status: DC | PRN
  Administered 2020-09-16 – 2020-09-19 (×3): 0.25 mg via ORAL
  Filled 2020-09-16 (×3): qty 1

## 2020-09-16 MED ORDER — ATORVASTATIN CALCIUM 40 MG PO TABS
40.0000 mg | ORAL_TABLET | Freq: Every day | ORAL | Status: DC
  Administered 2020-09-16 – 2020-09-17 (×2): 40 mg via ORAL
  Filled 2020-09-16 (×2): qty 1

## 2020-09-16 MED ORDER — DEXTROSE IN LACTATED RINGERS 5 % IV SOLN: INTRAVENOUS | Status: DC

## 2020-09-16 MED ORDER — INSULIN GLARGINE 100 UNIT/ML ~~LOC~~ SOLN
70.0000 [IU] | SUBCUTANEOUS | Status: DC
  Administered 2020-09-16 – 2020-09-21 (×5): 70 [IU] via SUBCUTANEOUS
  Filled 2020-09-16 (×8): qty 0.7

## 2020-09-16 MED ORDER — SODIUM CHLORIDE 0.9 % IV SOLN
500.0000 mg | INTRAVENOUS | Status: DC
  Administered 2020-09-17 – 2020-09-19 (×3): 500 mg via INTRAVENOUS
  Filled 2020-09-16 (×3): qty 500

## 2020-09-16 MED ORDER — VANCOMYCIN HCL 750 MG/150ML IV SOLN
750.0000 mg | Freq: Two times a day (BID) | INTRAVENOUS | Status: DC
  Administered 2020-09-16: 750 mg via INTRAVENOUS
  Filled 2020-09-16 (×5): qty 150

## 2020-09-16 MED ORDER — ALBUTEROL SULFATE (2.5 MG/3ML) 0.083% IN NEBU: 2.5000 mg | INHALATION_SOLUTION | Freq: Four times a day (QID) | RESPIRATORY_TRACT | Status: DC | PRN

## 2020-09-16 MED ORDER — INSULIN ASPART 100 UNIT/ML ~~LOC~~ SOLN: 0.0000 [IU] | Freq: Every day | SUBCUTANEOUS | Status: DC

## 2020-09-16 MED ORDER — DILTIAZEM HCL-DEXTROSE 125-5 MG/125ML-% IV SOLN (PREMIX)
5.0000 mg/h | INTRAVENOUS | Status: DC
  Administered 2020-09-16: 5 mg/h via INTRAVENOUS

## 2020-09-16 NOTE — ED Notes (Signed)
Critical lactic acid 4.7 taken by Leverne Humbles, RN; MD aware

## 2020-09-16 NOTE — ED Notes (Signed)
CRITICAL VALUE ALERT  Critical Value: Troponin 771 Date & Time Notied: 09/16/20 @ 2694 Provider Notified: Dr Eliane Decree Orders Received/Actions taken: None yet

## 2020-09-16 NOTE — Progress Notes (Signed)
Amion; Dr. Roderic Palau troponin increased to 1,884 no changes in orders.

## 2020-09-16 NOTE — Progress Notes (Signed)
ANTICOAGULATION CONSULT NOTE   Pharmacy Consult for heparin Indication: chest pain/ACS  Assessment: 49yo female c/o SOB, being admitted for diabetic ketoacidosis, UTI, and hypoxia, found to have mildly elevated troponin which is now rising (38>771), to begin heparin. Heparin level this evening 0.22 units/ml.  No issues with infusion.  No bleeding reported Goal of Therapy:  Heparin level 0.3-0.7 units/ml Monitor platelets by anticoagulation protocol: Yes   Plan:  Increase heparin 1400 units/ml Check heparin level in ~ 6 hours  Thanks for allowing pharmacy to be a part of this patient's care.  Excell [name], PharmD Clinical Pharmacist 09/16/2020,10:58 PM

## 2020-09-16 NOTE — ED Notes (Signed)
Date and time results received: 09/16/20 0552  Test: Lactic Critical Value: 4.6  Name of Provider Notified: Clearence Ped, [place] DO

## 2020-09-16 NOTE — Progress Notes (Signed)
Pharmacy Antibiotic Note  Yolanda [name] is a 49 y.o. female admitted on 09/16/2020 with pneumonia.  Pharmacy has been consulted for Vancomycin dosing. Pt received Azithromycin and Rocephin in ED.  Plan: Vancomycin 2088m IV now then 750 mg IV Q 12 hrs.  Will f/u renal function, micro data, and pt's clinical condition Vanc levels prn F/u gram negative coverage on admission   Height: 5' 7"  (170.2 cm) Weight: 105 kg (231 lb 7.7 oz) IBW/kg (Calculated) : 61.6  Temp (24hrs), Avg:103.9 F (39.9 C), Min:101.3 F (38.5 C), Max:105.6 F (40.9 C)  Recent Labs  Lab 09/16/20 0203  WBC 11.3*    CrCl cannot be calculated (Patient's most recent lab result is older than the maximum 21 days allowed.).    Allergies  Allergen Reactions  . Abilify [Aripiprazole] Other (See Comments)    Altered mental status   . Sulfa Antibiotics Hives, Rash and Other (See Comments)    unknown    Antimicrobials this admission: 10/16 Azith/Rocephin x 1 10/16 Vanc >>   Microbiology results: 10/16 BCx:  10/16 UCx:    Thank you for allowing pharmacy to be a part of this patient's care.  CSherlon Handing PharmD, BCPS Please see amion for complete clinical pharmacist phone list 09/16/2020 2:55 AM

## 2020-09-16 NOTE — Progress Notes (Signed)
Patient admitted to the hospital by Dr. Clearence Ped earlier this morning  Patient seen and examined.  She is currently lying in bed.  Cooling blankets are on.  She is on BiPAP.  She is awake, alert, able to answer questions.  She says she feels no different with BiPAP on.  She wants to drink water.  She does have a cough which is nonproductive.  She is not had any vomiting, diarrhea.  Lungs show coarse breath sounds bilaterally.  No significant lower extremity edema.  Abdomen is otherwise benign.  Assessment/plan:  Severe sepsis -Patient presented with severe sepsis with fever (temp of 105F), tachycardia, leukocytosis -Source is likely pneumonia versus UTI -Lactic acid elevated at 4.7 on admission -She has been started on IV fluids and lactic acid is trending down -Continue on IV antibiotics -Follow-up blood cultures  Acute respiratory failure with hypoxia -Oxygen saturation reportedly down to the 40s -Concern for developing pneumonia, but she will undergo CT angiogram of the chest to rule out pulmonary embolus -Currently on BiPAP, wean down as tolerated -Covid test is negative  NSTEMI -Initial troponin found to be 38, this is since trended up to 1290 -EKG shows sinus tachycardia without acute ischemic changes -Patient denies any chest pain at this time. -Discussed with Dr. Debara Pickett and it was felt with degree of breath or troponin, she likely has had an infarct -Recommended continuing on intravenous heparin, add aspirin and beta-blockers as tolerated -Check echocardiogram -May need to have cardiac catheterization later in the week once her medical issues have stabilized  Diabetic ketoacidosis -Blood sugar of 494 on admission with a serum bicarb of 15 and an anion gap of 23 -She is chronically on Tresiba, Ozempic, canagliflozin, Metformin. -These medications will be held and she has been started on insulin infusion per Endo tool protocol -Continue IV hydration  Community-acquired  pneumonia -Patient has been having cough and is significantly hypoxic -Currently on IV antibiotics -Follow-up cultures  Urinary tract infection -Urinalysis indicates possible infection -IV ceftriaxone -Follow-up culture  Lactic acidosis -Likely related to sepsis and Metformin use -Lactic acid is trending down -Continue IV fluids  Acute kidney injury -Baseline creatinine is 0.9 -Creatinine on admission 1.39 -Likely related to dehydration/sepsis -Continue IV fluids and monitor  Hypertension -Holding home dose of atenolol, lisinopril due to soft blood pressures  Critical care procedure note Authorized and performed by: Kathie Dike Total critical care time: Approximately 30 minutes Due to high probability of clinically significant, life-threatening deterioration, the patient required my highest level of preparedness to intervene emergently and I personally spent this critical care time directly and personally managing the patient.  The critical care time included obtaining a history, examining the patient, pulse oximetry, ordering and review of studies, arranging urgent treatment with development of a management plan, evaluation of patient's response to treatment, frequent reassessment, discussions with other providers.  Critical care time was performed to assess and manage the high probability of imminent, life-threatening deterioration that could result in multiorgan failure.  It was exclusive of separate billable procedures and treating other patients and teaching time.  Please see MDM section and the rest of the of note for further information on patient assessment and treatment   [employer]

## 2020-09-16 NOTE — ED Notes (Signed)
CRITICAL VALUE ALERT  Critical Value: tropnin 1290  Date & Time Notied: 0845  Provider Notified: memon  Orders Received/Actions taken:

## 2020-09-16 NOTE — ED Triage Notes (Signed)
Pt arrived via [facility] emergency traffic c/o increasing SOB. Pt called 911 reporting inability to breath. Pt denies Covid to EMS. Upon arrival, Pts fingers cyanotic, and EMS report Pt was 40% O2 room air to 60% room air. Pt on CPAP upon arrival.

## 2020-09-16 NOTE — Progress Notes (Signed)
Pt removed from [facility] and placed on 4lpm [place]. Transported to CT and then ICU without incident. Reported to RT that patient was stable and did well with transport.

## 2020-09-16 NOTE — Progress Notes (Signed)
*  PRELIMINARY RESULTS* Echocardiogram 2D Echocardiogram has been performed.  Leavy Cella 09/16/2020, 1:01 PM
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
## 2020-09-16 NOTE — H&P (Signed)
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 09/16/20 0528 09/16/20 0613  GLUCAP 556* 477* 481* 390*   Lipid Profile: Recent Labs    09/16/20 0203  TRIG 784*   Thyroid Function Tests: No results for input(s):  TSH, T4TOTAL, FREET4, T3FREE, THYROIDAB in the last 72 hours. Anemia Panel: Recent Labs    09/16/20 0203  FERRITIN 70    --------------------------------------------------------------------------------------------------------------- Urine analysis:    Component Value Date/Time   COLORURINE YELLOW 09/16/2020 0235   APPEARANCEUR HAZY (A) 09/16/2020 0235   LABSPEC 1.015 09/16/2020 0235   PHURINE 6.0 09/16/2020 0235   GLUCOSEU >=500 (A) 09/16/2020 0235   HGBUR SMALL (A) 09/16/2020 0235   BILIRUBINUR  NEGATIVE 09/16/2020 0235   KETONESUR NEGATIVE 09/16/2020 0235   PROTEINUR NEGATIVE 09/16/2020 0235   UROBILINOGEN 0.2 05/22/2012 1256   NITRITE POSITIVE (A) 09/16/2020 0235   LEUKOCYTESUR MODERATE (A) 09/16/2020 0235      Imaging Results:    DG Chest Portable 1 View  Result Date: 09/16/2020 CLINICAL DATA:  Shortness of breath EXAM: PORTABLE CHEST 1 VIEW COMPARISON:  05/11/2017 FINDINGS: Shallow lung inflation without focal airspace consolidation or pulmonary edema. Cardiomediastinal contours and pleural spaces are normal. IMPRESSION: Shallow lung inflation without focal airspace disease. Electronically Signed   By: Ulyses Jarred M.D.   On: 09/16/2020 02:56    My personal review of EKG: Rhythm sinus tachycardia, Rate 168/min, right bundle branch block    Assessment & Plan:    Active Problems:   Respiratory failure with hypoxia ([place])   1. Sepsis secondary to community-acquired pneumonia versus UTI 1. Chest x-ray per my interpretation looks suspicious for a developing right-sided infiltrate 2. UA is suspicious for UTI with greater than 50 white blood cells, and white blood cell clumps 3. Urine culture and blood cultures pending 4. Sputum culture pending 5. Rocephin, Zithromax, vancomycin started in the ED-continue 6. 2100 mL bolus given in the ED, patient may require more fluids due to 60 mg IV Lasix also being given 7. In a temperature of 105.6, respiratory rate of 33, heart  rate of 126, white blood cell count of 11.3, and a lactic acid of 4.7 and then 4.6 8. To control fever with Tylenol and cooling blanket 9. Continue IV fluids 10. Continue to trend lactic acid 11. Repeat chest x-ray 12. Monitor in ICU 2. Acute respiratory failure with hypoxia 1. Reported oxygen saturations down to 40s 2. Most likely secondary to developing pneumonia 3. D-dimer is also 9.53 which is likely inflammatory reaction to infection -CTA pending 4. Troponin also significantly increased from 38-700s -likely demand ischemia, will continue to trend, will start heparin drip while gathering more data 5. Patient does not have adequate normalization of respiratory failure with nonrebreather, currently on BiPAP at 50% 6. Wean off BiPAP as tolerated 7. ABG shows a pH of 7.253, PCO2 of 39.7, PO2 of 101 -this is after oxygen already being applied 8. Repeat VBG at 1400 9. Continue to monitor 3. Sinus tachycardia 1. Likely appropriate response to fever 2. CTA to rule out other contributing factors like blood clot 3. Cardizem drip ordered in the ED, pneumonia discontinued at this time as patient's blood pressure is too soft with systolic blood pressures in the 90s and 100s 4. Continue fluids and control fever and monitor tachycardia 4. Community-acquired pneumonia 1. Sputum culture pending 2. Blood cultures pending 3. Continue Rocephin, Zithromax, vancomycin 5. UTI 1. Urine culture pending 2. Continue Rocephin 6. DKA 1. Bicarb 15, glucose 533, gap 17, pH 7.25 2. Home regimen includes 20 units of short acting insulin 3 times daily, and 70 units of long-acting insulin at night 3. Patient is septic and also has skipped doses of insulin 4. Started on insulin drip 5. Continue Endo tool, n.p.o., follow BMPs 6. Monitor in ICU 7. Lactic acidosis 1. Initial lactic acid 4.7, repeat 4.6 after 2.1L bolus 2. Patient unfortunately also given 60 mg of Lasix 3. 125 mL/h 4. Continue to trend lactic  acid 8. Elevated troponin 1. Troponin significantly increased from 38-771 2. In the setting of AKI 3. Likely demand ischemia, but ACS not ruled out 4. Repeat EKG 5. Continue to trend troponin 6. Start heparin drip per  pharmacy consult 9. Elevated D-dimer 1. D-dimer 9.53 2. CTA pending 3. Likely secondary to cytokine storm in the setting of sepsis 10. AKI 1. Baseline creatinine 0.9 2. Today 1.39 3. Avoid nephrotoxic agents when possible 4. Continue fluids 5. Recheck on serial BMPs 11. Hypertension 1. Home atenolol 2. Holding home lisinopril in setting of AKI and soft blood pressures 12.    DVT Prophylaxis-   Heparin- SCDs  AM Labs Ordered, also please review Full Orders  Family Communication: No family at bedside Code Status: Full  Admission status: Inpatient :The appropriate admission status for this patient is INPATIENT. Inpatient status is judged to be reasonable and necessary in order to provide the required intensity of service to ensure the patient's safety. The patient's presenting symptoms, physical exam findings, and initial radiographic and laboratory data in the context of their chronic comorbidities is felt to place them at high risk for further clinical deterioration. Furthermore, it is not anticipated that the patient will be medically stable for discharge from the hospital within 2 midnights of admission. The following factors support the admission status of inpatient.     The patient's presenting symptoms include chills The worrisome physical exam findings include hypoxia, soft blood pressures, SIRS criteria, UTI. The initial radiographic and laboratory data are worrisome because of UA indicative of UTI The chronic co-morbidities include type 2 diabetes mellitus, psychiatric disorders, hypertension, dyslipidemia       * I certify that at the point of admission it is my clinical judgment that the patient will require inpatient hospital care spanning beyond 2  midnights from the point of admission due to high intensity of service, high risk for further deterioration and high frequency of surveillance required.*  Time spent in minutes : [employer]

## 2020-09-16 NOTE — ED Notes (Signed)
CRITICAL VALUE ALERT  Critical Value:  Lactic2.8  Date & Time Notied:  09/16/20  0768  Provider Notified: memon  Orders Received/Actions taken:

## 2020-09-16 NOTE — ED Notes (Signed)
Date and time results received: 09/16/20 03:58 (use smartphrase ".now" to insert current time)  Test: Glucose Critical Value: 533  Name of Provider Notified: Tomi Bamberger  Orders Received? Or Actions Taken?: Provider notified
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
## 2020-09-16 NOTE — ED Notes (Signed)
Pt has been packed with ice following Tylenol Suppository. Xray at bedside for Portable DG Chest.

## 2020-09-16 NOTE — ED Notes (Signed)
Bilateral soft wrist restraints removed at this time. Pt verbalized understanding of removal criteria at this time. Pt agreed to not interfere with medical equipment.

## 2020-09-17 DIAGNOSIS — I5021 Acute systolic (congestive) heart failure: Secondary | ICD-10-CM

## 2020-09-17 DIAGNOSIS — F319 Bipolar disorder, unspecified: Secondary | ICD-10-CM

## 2020-09-17 DIAGNOSIS — I5032 Chronic diastolic (congestive) heart failure: Secondary | ICD-10-CM

## 2020-09-17 DIAGNOSIS — J189 Pneumonia, unspecified organism: Secondary | ICD-10-CM

## 2020-09-17 LAB — COMPREHENSIVE METABOLIC PANEL
ALT: 756 U/L — ABNORMAL HIGH (ref 0–44)
AST: 925 U/L — ABNORMAL HIGH (ref 15–41)
Albumin: 2.9 g/dL — ABNORMAL LOW (ref 3.5–5.0)
Alkaline Phosphatase: 98 U/L (ref 38–126)
Anion gap: 13 (ref 5–15)
BUN: 17 mg/dL (ref 6–20)
CO2: 20 mmol/L — ABNORMAL LOW (ref 22–32)
Calcium: 8.2 mg/dL — ABNORMAL LOW (ref 8.9–10.3)
Chloride: 106 mmol/L (ref 98–111)
Creatinine, Ser: 0.89 mg/dL (ref 0.44–1.00)
GFR, Estimated: >60 mL/min (ref >60)
Glucose, Bld: 203 mg/dL — ABNORMAL HIGH (ref 70–99)
Potassium: 4.1 mmol/L (ref 3.5–5.1)
Sodium: 139 mmol/L (ref 135–145)
Total Bilirubin: 1.1 mg/dL (ref 0.3–1.2)
Total Protein: 6.1 g/dL — ABNORMAL LOW (ref 6.5–8.1)

## 2020-09-17 LAB — CBC
HCT: 34.3 % — ABNORMAL LOW (ref 36.0–46.0)
Hemoglobin: 10.7 g/dL — ABNORMAL LOW (ref 12.0–15.0)
MCH: 27.9 pg (ref 26.0–34.0)
MCHC: 31.2 g/dL (ref 30.0–36.0)
MCV: 89.3 fL (ref 80.0–100.0)
Platelets: 135 10*3/uL — ABNORMAL LOW (ref 150–400)
RBC: 3.84 MIL/uL — ABNORMAL LOW (ref 3.87–5.11)
RDW: 15.9 % — ABNORMAL HIGH (ref 11.5–15.5)
WBC: 9.9 10*3/uL (ref 4.0–10.5)
nRBC: 0 % (ref 0.0–0.2)

## 2020-09-17 LAB — BLOOD CULTURE ID PANEL (REFLEXED) - BCID2

## 2020-09-17 LAB — CBC WITH DIFFERENTIAL/PLATELET
Abs Immature Granulocytes: 0.1 10*3/uL — ABNORMAL HIGH (ref 0.00–0.07)
Basophils Absolute: 0.1 10*3/uL (ref 0.0–0.1)
Basophils Relative: 1 %
Eosinophils Absolute: 0.1 10*3/uL (ref 0.0–0.5)
Eosinophils Relative: 1 %
HCT: 40.5 % (ref 36.0–46.0)
Hemoglobin: 12.5 g/dL (ref 12.0–15.0)
Immature Granulocytes: 1 %
Lymphocytes Relative: 17 %
Lymphs Abs: 2 10*3/uL (ref 0.7–4.0)
MCH: 27.4 pg (ref 26.0–34.0)
MCHC: 30.9 g/dL (ref 30.0–36.0)
MCV: 88.6 fL (ref 80.0–100.0)
Monocytes Absolute: 0.6 10*3/uL (ref 0.1–1.0)
Monocytes Relative: 5 %
Neutro Abs: 8.6 10*3/uL — ABNORMAL HIGH (ref 1.7–7.7)
Neutrophils Relative %: 75 %
Platelets: 145 10*3/uL — ABNORMAL LOW (ref 150–400)
RBC: 4.57 MIL/uL (ref 3.87–5.11)
RDW: 15.8 % — ABNORMAL HIGH (ref 11.5–15.5)
WBC: 11.4 10*3/uL — ABNORMAL HIGH (ref 4.0–10.5)
nRBC: 0.2 % (ref 0.0–0.2)

## 2020-09-17 LAB — C DIFFICILE QUICK SCREEN W PCR REFLEX
C Diff antigen: NEGATIVE
C Diff interpretation: NOT DETECTED
C Diff toxin: NEGATIVE

## 2020-09-17 LAB — PROTIME-INR
INR: 1.1 (ref 0.8–1.2)
Prothrombin Time: 14 seconds (ref 11.4–15.2)

## 2020-09-17 LAB — HEPARIN LEVEL (UNFRACTIONATED)
Heparin Unfractionated: 0.2 IU/mL — ABNORMAL LOW (ref 0.30–0.70)
Heparin Unfractionated: <0.1 IU/mL — ABNORMAL LOW (ref 0.30–0.70)

## 2020-09-17 LAB — GLUCOSE, CAPILLARY
Glucose-Capillary: 182 mg/dL — ABNORMAL HIGH (ref 70–99)
Glucose-Capillary: 247 mg/dL — ABNORMAL HIGH (ref 70–99)
Glucose-Capillary: 178 mg/dL — ABNORMAL HIGH (ref 70–99)
Glucose-Capillary: 183 mg/dL — ABNORMAL HIGH (ref 70–99)

## 2020-09-17 LAB — MAGNESIUM: Magnesium: 1.8 mg/dL (ref 1.7–2.4)

## 2020-09-17 LAB — CORTISOL-AM, BLOOD: Cortisol - AM: 9.1 ug/dL (ref 6.7–22.6)

## 2020-09-17 LAB — PROCALCITONIN: Procalcitonin: 44.09 ng/mL

## 2020-09-17 LAB — TROPONIN I (HIGH SENSITIVITY): Troponin I (High Sensitivity): 1865 ng/L (ref <18)

## 2020-09-17 LAB — LACTIC ACID, PLASMA: Lactic Acid, Venous: 1.8 mmol/L (ref 0.5–1.9)

## 2020-09-17 MED ORDER — METOPROLOL TARTRATE 5 MG/5ML IV SOLN
5.0000 mg | Freq: Once | INTRAVENOUS | Status: AC
  Administered 2020-09-17: 5 mg via INTRAVENOUS
  Filled 2020-09-17: qty 5

## 2020-09-17 MED ORDER — PNEUMOCOCCAL VAC POLYVALENT 25 MCG/0.5ML IJ INJ
0.5000 mL | INJECTION | INTRAMUSCULAR | Status: AC
  Administered 2020-09-19: 0.5 mL via INTRAMUSCULAR
  Filled 2020-09-17: qty 0.5

## 2020-09-17 MED ORDER — HEPARIN BOLUS VIA INFUSION
2500.0000 [IU] | Freq: Once | INTRAVENOUS | Status: AC
  Administered 2020-09-17: 2500 [IU] via INTRAVENOUS
  Filled 2020-09-17: qty 2500

## 2020-09-17 MED ORDER — ASPIRIN 325 MG PO TABS
325.0000 mg | ORAL_TABLET | Freq: Every day | ORAL | Status: DC
  Administered 2020-09-17 – 2020-09-18 (×2): 325 mg via ORAL
  Filled 2020-09-17 (×2): qty 1

## 2020-09-17 MED ORDER — HEPARIN BOLUS VIA INFUSION
1200.0000 [IU] | Freq: Once | INTRAVENOUS | Status: AC
  Administered 2020-09-17: 1200 [IU] via INTRAVENOUS
  Filled 2020-09-17: qty 1200

## 2020-09-17 MED ORDER — INFLUENZA VAC SPLIT QUAD 0.5 ML IM SUSY: 0.5000 mL | PREFILLED_SYRINGE | INTRAMUSCULAR | Status: DC

## 2020-09-17 MED ORDER — INFLUENZA VAC SPLIT QUAD 0.5 ML IM SUSY
0.5000 mL | PREFILLED_SYRINGE | INTRAMUSCULAR | Status: AC
  Administered 2020-09-19: 0.5 mL via INTRAMUSCULAR
  Filled 2020-09-17: qty 0.5

## 2020-09-17 MED ORDER — SODIUM CHLORIDE 0.9 % IV SOLN
2.0000 g | INTRAVENOUS | Status: DC
  Administered 2020-09-18 – 2020-09-19 (×2): 2 g via INTRAVENOUS
  Filled 2020-09-17 (×2): qty 20

## 2020-09-17 MED ORDER — SODIUM CHLORIDE 0.9 % IV SOLN
1.0000 g | Freq: Once | INTRAVENOUS | Status: AC
  Administered 2020-09-17: 1 g via INTRAVENOUS
  Filled 2020-09-17: qty 10

## 2020-09-17 NOTE — Progress Notes (Signed)
Messaged Dr. Roderic Palau with lab of positive blood culture with gram negative rods E-Coli.  Responded that current antibiotic should cover this.
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
## 2020-09-17 NOTE — Progress Notes (Signed)
PROGRESS NOTE    PERLA ECHAVARRIA  BSW:[id number] DOB: 06-Apr-1971 DOA: 09/16/2020 PCP: Lucia Gaskins, MD    Brief Narrative:  Yolanda [name]  is a 49 y.o. female, with history of type 2 diabetes mellitus, panic disorder, GERD, essential hypertension, dyslipidemia, psychiatric disorders, and more presents to the ED with a chief complaint of "I was freezing cold."  Patient reports that she had 2 episodes of freezing cold.  The first was on 10/14.  She called EMS, and is not able to explain why she was not transported to the hospital at that time or what their findings were.  Again on October 15 she had another episode of freezing cold at night.  She called EMS again and when they arrived they found her oxygen saturations to be between 40 and 60% they placed her on nonrebreather and brought her into the ER.  Patient reports that for the past couple of days she has had fatigue and weakness.  She does report difficulty breathing.  She cannot think of anything that makes it worse or better.  She has had a cough that is dry.  She reports she has had body aches, and nausea but no vomiting no diarrhea no abdominal pain.  Patient reports that she has not checked her temperature at home but she has had shaking chills.  Patient is not vaccinated for Covid because she "does not trust the vaccine."  Patient denies chest pain, feeling like her heart is racing, and presyncopal symptoms.  Patient denies sick contacts.  Patient does admit that since she has not been feeling well she has likely missed several doses of her insulin.  Patient is on intensive insulin regimen with 20 units of Humalog with each meal, and 7 units of Tresiba at night.  Patient reports that she has had polyuria and polydipsia.  She does not know what her sugars have been running at home.  Her last hemoglobin A1c in our system was almost 10, but that was a couple years ago.   Assessment & Plan:   Active Problems:   Hypertension   Bipolar 1  disorder (HCC)   Type 2 diabetes mellitus, uncontrolled (HCC)   Urinary tract infection, site not specified   Severe sepsis (HCC)   Acute respiratory failure with hypoxia (HCC)   NSTEMI (non-ST elevated myocardial infarction) ([place])   DKA, type 2 ([place])   CAP (community acquired pneumonia)   AKI (acute kidney injury) ([place])   Acute systolic CHF (congestive heart failure) (HCC)   Severe sepsis due to E. coli -Patient presented with severe sepsis with fever (temp of 105F), tachycardia, leukocytosis -Source is likely pneumonia versus UTI -Lactic acid elevated at 4.7 on admission, which normalized with IV fluids -Continue on IV antibiotics -Blood cultures show 1 out of 2 positive cultures for E. coli  Acute respiratory failure with hypoxia -Oxygen saturation reportedly down to the 40s on room air on admission -Concern for developing pneumonia -CT chest negative for pulmonary embolus, but did demonstrate bilateral infiltrates -Initially on BiPAP, has since been weaned down to nasal cannula -Covid test is negative x 2  NSTEMI -Initial troponin found to be 38, which then peaked at 2886 -EKG shows sinus tachycardia without acute ischemic changes -Patient denies any chest pain at this time. -Discussed with Dr. Debara Pickett and it was felt with degree of rise of troponin, she likely has had an infarct -Recommended continuing on intravenous heparin, add aspirin and beta-blockers as tolerated -Echo shows EF of 20 to  25% with wall motion abnormalities.  Will request cardiology consult   Acute systolic congestive heart failure -EF of 20 to 25% on echocardiogram -Potentially Takotsubo cardiomyopathy versus LAD disease -We will request cardiology input.  Diabetic ketoacidosis -Blood sugar of 494 on admission with a serum bicarb of 15 and an anion gap of 23 -She is chronically on Tresiba, Ozempic, canagliflozin, Metformin. -Patient was placed on DKA protocol with IV insulin and IV fluids -Anion  gap has closed and she has been transitioned back to subcutaneous insulin -Blood sugars have been stable.  Community-acquired pneumonia -Patient has been having cough and is significantly hypoxic -Currently on IV antibiotics  Urinary tract infection -Urine culture positive for gram-negative rods, anticipate E. coli -IV ceftriaxone  Lactic acidosis -Likely related to sepsis and Metformin use -Lactic acid has normalized with IV fluids  Acute kidney injury -Baseline creatinine is 0.9 -Creatinine on admission 1.39 -Likely related to dehydration/sepsis -Creatinine improved to 0.8 with hydration  Elevated LFTs -s/p cholecystectomy -no significant RUQ tenderness -suspect this is related to sepsis -continue to follow  Hypertension -Holding home dose of atenolol, lisinopril due to soft blood pressures  Diarrhea -Reports multiple loose stools since admission -Check C. difficile   DVT prophylaxis: SCDs Start: 09/16/20 0550, Heparin infusion  Code Status: Full code Family Communication: Discussed with sister at the bedside Disposition Plan: Status is: Inpatient  Remains inpatient appropriate because:Inpatient level of care appropriate due to severity of illness   Dispo: The patient is from: Home              Anticipated d/c is to: Home              Anticipated d/c date is: 3 days              Patient currently is not medically stable to d/c.    Consultants:     Procedures:   Echo  Antimicrobials:   Ceftriaxone 10/16 >  Azithromycin 10/16 >   Subjective: Complains of cough.  She generally feels unwell.  She reports having loose stools since yesterday.  Objective: Vitals:   09/17/20 1500 09/17/20 1600 09/17/20 1608 09/17/20 1700  BP: 107/63 (!) 98/43  99/63  Pulse: (!) 116 (!) 118  (!) 117  Resp: (!) 29 (!) 26  (!) 25  Temp:   100.1 F (37.8 C)   TempSrc:   Oral   SpO2: 100% 100%  98%  Weight:      Height:        Intake/Output Summary (Last 24  hours) at 09/17/2020 1930 Last data filed at 09/17/2020 1620 Gross per 24 hour  Intake 2029.89 ml  Output 2661 ml  Net -631.11 ml   Filed Weights   09/16/20 0216 09/16/20 1000  Weight: 105 kg 107.3 kg    Examination:  General exam: Appears calm and comfortable  Respiratory system: Bilateral rhonchi and wheezes. Respiratory effort normal. Cardiovascular system: S1 & S2 heard, RRR. No JVD, murmurs, rubs, gallops or clicks. No pedal edema. Gastrointestinal system: Abdomen is nondistended, soft and nontender. No organomegaly or masses felt. Normal bowel sounds heard. Central nervous system: Alert and oriented. No focal neurological deficits. Extremities: Symmetric 5 x 5 power. Skin: No rashes, lesions or ulcers Psychiatry: Judgement and insight appear normal. Mood & affect appropriate.     Data Reviewed: I have personally reviewed following labs and imaging studies  CBC: Recent Labs  Lab 09/16/20 0203 09/17/20 0600 09/17/20 1014  WBC 11.3* 11.4* 9.9  NEUTROABS 4.8  8.6*  --   HGB 15.0 12.5 10.7*  HCT 48.2* 40.5 34.3*  MCV 88.8 88.6 89.3  PLT 291 145* 580*   Basic Metabolic Panel: Recent Labs  Lab 09/16/20 0742 09/16/20 1207 09/16/20 1429 09/16/20 1900 09/17/20 0600  NA 136 141 138 138 139  K 3.3* 3.2* 3.3* 2.9* 4.1  CL 107 111 109 107 106  CO2 17* 18* 20* 20* 20*  GLUCOSE 291* 196* 207* 145* 203*  BUN 23* 19 20 18 17   CREATININE 1.30* 0.94 1.08* 0.95 0.89  CALCIUM 8.3* 6.7* 7.9* 8.2* 8.2*  MG  --   --   --   --  1.8   GFR: Estimated Creatinine Clearance: 98.6 mL/min (by C-G formula based on SCr of 0.89 mg/dL). Liver Function Tests: Recent Labs  Lab 09/16/20 0203 09/17/20 0600  AST 73* 925*  ALT 65* 756*  ALKPHOS 107 98  BILITOT 0.9 1.1  PROT 7.6 6.1*  ALBUMIN 3.6 2.9*   No results for input(s): LIPASE, AMYLASE in the last 168 hours. No results for input(s): AMMONIA in the last 168 hours. Coagulation Profile: Recent Labs  Lab 09/17/20 0600  INR  1.1   Cardiac Enzymes: No results for input(s): CKTOTAL, CKMB, CKMBINDEX, TROPONINI in the last 168 hours. BNP (last 3 results) No results for input(s): PROBNP in the last 8760 hours. HbA1C: No results for input(s): HGBA1C in the last 72 hours. CBG: Recent Labs  Lab 09/16/20 1656 09/16/20 1953 09/17/20 0733 09/17/20 1200 09/17/20 1602  GLUCAP 140* 142* 182* 247* 178*   Lipid Profile: Recent Labs    09/16/20 0203  TRIG 784*   Thyroid Function Tests: No results for input(s): TSH, T4TOTAL, FREET4, T3FREE, THYROIDAB in the last 72 hours. Anemia Panel: Recent Labs    09/16/20 0203  FERRITIN 70   Sepsis Labs: Recent Labs  Lab 09/16/20 0203 09/16/20 0251 09/16/20 0507 09/16/20 0742 09/17/20 0600 09/17/20 1634  PROCALCITON 12.79  --   --   --  44.09  --   LATICACIDVEN  --  4.7* 4.6* 2.8*  --  1.8    Recent Results (from the past 240 hour(s))  Respiratory Panel by RT PCR (Flu A&B, Covid) - Nasopharyngeal Swab     Status: None   Collection Time: 09/16/20  2:12 AM   Specimen: Nasopharyngeal Swab  Result Value Ref Range Status   SARS Coronavirus 2 by RT PCR NEGATIVE NEGATIVE Final    Comment: (NOTE) SARS-CoV-2 target nucleic acids are NOT DETECTED.  The SARS-CoV-2 RNA is generally detectable in upper respiratoy specimens during the acute phase of infection. The lowest concentration of SARS-CoV-2 viral copies this assay can detect is 131 copies/mL. A negative result does not preclude SARS-Cov-2 infection and should not be used as the sole basis for treatment or other patient management decisions. A negative result may occur with  improper specimen collection/handling, submission of specimen other than nasopharyngeal swab, presence of viral mutation(s) within the areas targeted by this assay, and inadequate number of viral copies (<131 copies/mL). A negative result must be combined with clinical observations, patient history, and epidemiological information.  The expected result is Negative.  Fact Sheet for Patients:  PinkCheek.be  Fact Sheet for Healthcare Providers:  GravelBags.it  This test is no t yet approved or cleared by the [place] FDA and  has been authorized for detection and/or diagnosis of SARS-CoV-2 by FDA under an Emergency Use Authorization (EUA). This EUA will remain  in effect (meaning this test can be  used) for the duration of the COVID-19 declaration under Section 564(b)(1) of the Act, 21 U.S.C. section 360bbb-3(b)(1), unless the authorization is terminated or revoked sooner.     Influenza A by PCR NEGATIVE NEGATIVE Final   Influenza B by PCR NEGATIVE NEGATIVE Final    Comment: (NOTE) The Xpert Xpress SARS-CoV-2/FLU/RSV assay is intended as an aid in  the diagnosis of influenza from Nasopharyngeal swab specimens and  should not be used as a sole basis for treatment. Nasal washings and  aspirates are unacceptable for Xpert Xpress SARS-CoV-2/FLU/RSV  testing.  Fact Sheet for Patients: PinkCheek.be  Fact Sheet for Healthcare Providers: GravelBags.it  This test is not yet approved or cleared by the [place] FDA and  has been authorized for detection and/or diagnosis of SARS-CoV-2 by  FDA under an Emergency Use Authorization (EUA). This EUA will remain  in effect (meaning this test can be used) for the duration of the  Covid-19 declaration under Section 564(b)(1) of the Act, 21  U.S.C. section 360bbb-3(b)(1), unless the authorization is  terminated or revoked. Performed at [facility], [address], [place], [place] [address]   Urine culture     Status: Abnormal (Preliminary result)   Collection Time: 09/16/20  2:35 AM   Specimen: Urine, Catheterized  Result Value Ref Range Status   Specimen Description   Final    URINE, CATHETERIZED Performed at [facility], [address],  [place], [place] [address]    Special Requests   Final    NONE Performed at [facility] [address], [place], [place] [address]    Culture >=100,000 COLONIES/mL GRAM NEGATIVE RODS (A)  Final   Report Status PENDING  Incomplete  Blood Culture (routine x 2)     Status: None (Preliminary result)   Collection Time: 09/16/20  2:52 AM   Specimen: Left Antecubital; Blood  Result Value Ref Range Status   Specimen Description   Final    LEFT ANTECUBITAL Performed at [facility], [address], [place], [place] [address]    Special Requests   Final    BOTTLES DRAWN AEROBIC AND ANAEROBIC Blood Culture adequate volume Performed at [facility], [address], [place], [place] [address]    Culture  Setup Time   Final    GRAM NEGATIVE RODS CRITICAL RESULT CALLED TO, READ BACK BY AND VERIFIED WITH: RN C ALSTON [id number] AT 086 AM BY CM Performed at [facility], [place] [address], [place], [place] [address]    Culture GRAM NEGATIVE RODS  Final   Report Status PENDING  Incomplete  Blood Culture ID Panel (Reflexed)     Status: Abnormal   Collection Time: 09/16/20  2:52 AM  Result Value Ref Range Status   Enterococcus faecalis NOT DETECTED NOT DETECTED Final   Enterococcus Faecium NOT DETECTED NOT DETECTED Final   Listeria monocytogenes NOT DETECTED NOT DETECTED Final   Staphylococcus species NOT DETECTED NOT DETECTED Final   Staphylococcus aureus (BCID) NOT DETECTED NOT DETECTED Final   Staphylococcus epidermidis NOT DETECTED NOT DETECTED Final   Staphylococcus lugdunensis NOT DETECTED NOT DETECTED Final   Streptococcus species NOT DETECTED NOT DETECTED Final   Streptococcus agalactiae NOT DETECTED NOT DETECTED Final   Streptococcus pneumoniae NOT DETECTED NOT DETECTED Final   Streptococcus pyogenes NOT DETECTED NOT DETECTED Final   A.calcoaceticus-baumannii NOT DETECTED NOT DETECTED Final   Bacteroides fragilis NOT DETECTED NOT DETECTED Final   Enterobacterales DETECTED (A) NOT DETECTED  Final    Comment: Enterobacterales  represent a large order of gram negative bacteria, not a single organism. CRITICAL RESULT CALLED TO, READ BACK BY AND VERIFIED WITH: RN C ALSTON [id number] AT 900 AM BY CM    Enterobacter cloacae complex NOT DETECTED NOT DETECTED Final   Escherichia coli DETECTED (A) NOT DETECTED Final    Comment: CRITICAL RESULT CALLED TO, READ BACK BY AND VERIFIED WITH: RN C ALSTON [id number] AT 900 AM BY CM    Klebsiella aerogenes NOT DETECTED NOT DETECTED Final   Klebsiella oxytoca NOT DETECTED NOT DETECTED Final   Klebsiella pneumoniae NOT DETECTED NOT DETECTED Final   Proteus species NOT DETECTED NOT DETECTED Final   Salmonella species NOT DETECTED NOT DETECTED Final   Serratia marcescens NOT DETECTED NOT DETECTED Final   Haemophilus influenzae NOT DETECTED NOT DETECTED Final   Neisseria meningitidis NOT DETECTED NOT DETECTED Final   Pseudomonas aeruginosa NOT DETECTED NOT DETECTED Final   Stenotrophomonas maltophilia NOT DETECTED NOT DETECTED Final   Candida albicans NOT DETECTED NOT DETECTED Final   Candida auris NOT DETECTED NOT DETECTED Final   Candida glabrata NOT DETECTED NOT DETECTED Final   Candida krusei NOT DETECTED NOT DETECTED Final   Candida parapsilosis NOT DETECTED NOT DETECTED Final   Candida tropicalis NOT DETECTED NOT DETECTED Final   Cryptococcus neoformans/gattii NOT DETECTED NOT DETECTED Final   CTX-M ESBL NOT DETECTED NOT DETECTED Final   Carbapenem resistance IMP NOT DETECTED NOT DETECTED Final   Carbapenem resistance KPC NOT DETECTED NOT DETECTED Final   Carbapenem resistance NDM NOT DETECTED NOT DETECTED Final   Carbapenem resist OXA 48 LIKE NOT DETECTED NOT DETECTED Final   Carbapenem resistance VIM NOT DETECTED NOT DETECTED Final    Comment: Performed at [facility], [address], [place], [place] [address]  Blood Culture (routine x 2)     Status: None (Preliminary result)   Collection Time: 09/16/20  3:09 AM   Specimen: BLOOD  LEFT HAND  Result Value Ref Range Status   Specimen Description BLOOD LEFT HAND  Final   Special Requests   Final    BOTTLES DRAWN AEROBIC AND ANAEROBIC Blood Culture adequate volume   Culture   Final    NO GROWTH < 12 HOURS Performed at [facility], [address], [place], [place] [address]    Report Status PENDING  Incomplete  Respiratory Panel by RT PCR (Flu A&B, Covid) - Nasopharyngeal Swab     Status: None   Collection Time: 09/16/20  6:03 AM   Specimen: Nasopharyngeal Swab  Result Value Ref Range Status   SARS Coronavirus 2 by RT PCR NEGATIVE NEGATIVE Final    Comment: (NOTE) SARS-CoV-2 target nucleic acids are NOT DETECTED.  The SARS-CoV-2 RNA is generally detectable in upper respiratoy specimens during the acute phase of infection. The lowest concentration of SARS-CoV-2 viral copies this assay can detect is 131 copies/mL. A negative result does not preclude SARS-Cov-2 infection and should not be used as the sole basis for treatment or other patient management decisions. A negative result may occur with  improper specimen collection/handling, submission of specimen other than nasopharyngeal swab, presence of viral mutation(s) within the areas targeted by this assay, and inadequate number of viral copies (<131 copies/mL). A negative result must be combined with clinical observations, patient history, and epidemiological information. The expected result is Negative.  Fact Sheet for Patients:  PinkCheek.be  Fact Sheet for Healthcare Providers:  GravelBags.it  This test is no t yet approved or cleared by the [place]  States FDA and  has been authorized for detection and/or diagnosis of SARS-CoV-2 by FDA under an Emergency Use Authorization (EUA). This EUA will remain  in effect (meaning this test can be used) for the duration of the COVID-19 declaration under Section 564(b)(1) of the Act, 21 U.S.C. section  360bbb-3(b)(1), unless the authorization is terminated or revoked sooner.     Influenza A by PCR NEGATIVE NEGATIVE Final   Influenza B by PCR NEGATIVE NEGATIVE Final    Comment: (NOTE) The Xpert Xpress SARS-CoV-2/FLU/RSV assay is intended as an aid in  the diagnosis of influenza from Nasopharyngeal swab specimens and  should not be used as a sole basis for treatment. Nasal washings and  aspirates are unacceptable for Xpert Xpress SARS-CoV-2/FLU/RSV  testing.  Fact Sheet for Patients: PinkCheek.be  Fact Sheet for Healthcare Providers: GravelBags.it  This test is not yet approved or cleared by the [place] FDA and  has been authorized for detection and/or diagnosis of SARS-CoV-2 by  FDA under an Emergency Use Authorization (EUA). This EUA will remain  in effect (meaning this test can be used) for the duration of the  Covid-19 declaration under Section 564(b)(1) of the Act, 21  U.S.C. section 360bbb-3(b)(1), unless the authorization is  terminated or revoked. Performed at [facility], [address] [place], [place] [address]   MRSA PCR Screening     Status: None   Collection Time: 09/16/20  9:45 AM   Specimen: Nasal Mucosa; Nasopharyngeal  Result Value Ref Range Status   MRSA by PCR NEGATIVE NEGATIVE Final    Comment:        The GeneXpert MRSA Assay (FDA approved for NASAL specimens only), is one component of a comprehensive MRSA colonization surveillance program. It is not intended to diagnose MRSA infection nor to guide or monitor treatment for MRSA infections. Performed at [facility], [address] [place], [place] [address]          Radiology Studies: CT Angio Chest PE W/Cm &/Or Wo Cm  Result Date: 09/16/2020 CLINICAL DATA:  Chest pain, shortness of breath and fever. Evaluate for pulmonary embolism. EXAM: CT ANGIOGRAPHY CHEST WITH CONTRAST TECHNIQUE: Multidetector CT imaging of the chest was  performed using the standard protocol during bolus administration of intravenous contrast. Multiplanar CT image reconstructions and MIPs were obtained to evaluate the vascular anatomy. CONTRAST:  78m OMNIPAQUE IOHEXOL 350 MG/ML SOLN COMPARISON:  Chest radiograph-earlier same day; CT the chest, abdomen pelvis-04/09/2011 FINDINGS: Vascular Findings: There is adequate opacification of the pulmonary arterial system with the main pulmonary artery measuring 404 Hounsfield units. There are no discrete filling defects within the pulmonary arterial tree to suggest pulmonary embolism. Normal caliber of the main pulmonary artery. Normal heart size. Coronary artery calcifications. No pericardial effusion. No evidence of thoracic aortic aneurysm. Bovine configuration of the aortic arch. Review of the MIP images confirms the above findings. ---------------------------------------------------------------------------------- Nonvascular Findings: Mediastinum/Lymph Nodes: No bulky mediastinal, hilar axillary lymphadenopathy. Lungs/Pleura: Extensive bilateral nodular ground-glass airspace opacities with slight left-sided predominance and pleural sparing. There is mild centralized bronchial airway thickening however the central airways appear patent. Trace bilateral pleural effusions. No pneumothorax. Evaluation for discrete pulmonary nodules is degraded secondary to parenchymal abnormalities. Upper abdomen: Limited early arterial phase evaluation of the upper abdomen demonstrates nodularity of the hepatic contour. Musculoskeletal: No acute or aggressive osseous abnormalities. 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 Regional soft tissues appear normal. Normal appearance of the thyroid gland. IMPRESSION: 1. No evidence of pulmonary  embolism. 2. Extensive bilateral nodular ground-glass airspace opacities with slight left-sided predominance and pleural sparing, nonspecific with differential considerations including  asymmetric alveolar pulmonary edema (favored given markedly elevated troponin level) and multifocal atypical infection (including COVID-19). Clinical correlation is advised. 3. Coronary artery calcifications. Aortic Atherosclerosis (ICD10-I70.0). 4. Nodularity of the hepatic contour as could be seen in the setting of early cirrhotic change. Correlation with LFTs is advised. Electronically Signed   By: Sandi Mariscal M.D.   On: 09/16/2020 09:32   Portable chest 1 View  Result Date: 09/16/2020 CLINICAL DATA:  Shortness of breath.  Reported COVID-19 negative EXAM: PORTABLE CHEST 1 VIEW COMPARISON:  September 16, 2020 study obtained earlier in the day; CT angiogram chest September 16, 2020 FINDINGS: There is airspace opacity throughout the lungs bilaterally, somewhat more on the left than on the right, similar in appearance to studies obtained earlier in the day. No new opacity evident. Heart size and pulmonary vascularity are normal. No adenopathy. No bone lesions. IMPRESSION: Widespread airspace opacity which has an appearance which could be indicative of pulmonary edema or multifocal pneumonia. Both entities may be present concurrently. Heart size normal. No adenopathy. Electronically Signed   By: Lowella Grip III M.D.   On: 09/16/2020 14:26   DG Chest Portable 1 View  Result Date: 09/16/2020 CLINICAL DATA:  Shortness of breath EXAM: PORTABLE CHEST 1 VIEW COMPARISON:  05/11/2017 FINDINGS: Shallow lung inflation without focal airspace consolidation or pulmonary edema. Cardiomediastinal contours and pleural spaces are normal. IMPRESSION: Shallow lung inflation without focal airspace disease. Electronically Signed   By: Ulyses Jarred M.D.   On: 09/16/2020 02:56   ECHOCARDIOGRAM COMPLETE  Result Date: 09/16/2020    ECHOCARDIOGRAM REPORT   Patient Name:   BABARA BUFFALO Date of Exam: 09/16/2020 Medical Rec #:  [id number]      Height:       67.0 in Accession #:    [id number]     Weight:       236.6 lb Date of  Birth:  05/30/1971     BSA:          2.172 m Patient Age:    63 years       BP:           87/46 mmHg Patient Gender: F              HR:           112 bpm. Exam Location:  [facility] Procedure: 2D Echo Indications:    Elevated Troponin  History:        Patient has no prior history of Echocardiogram examinations.                 Risk Factors:Hypertension, Diabetes and Dyslipidemia. Borderline                 personality disorder.  Sonographer:    Leavy Cella RDCS (AE) Referring Phys: [place]  1. There is severe hypokinesis of the mid-to-distal anterolateral, septal, and anterior LV segments. The apex appears akinetic. Pattern concerning for wrap-around LAD lesion versus possible takotsubo cardiomyopathy.. Left ventricular ejection fraction, by estimation, is 20 to 25%. The left ventricle has severely decreased function. There is mild concentric left ventricular hypertrophy. Left ventricular diastolic parameters are indeterminate.  2. Right ventricular systolic function is mildly reduced. The right ventricular size is normal.  3. The mitral valve is normal in structure. Trivial mitral valve regurgitation.  4. The aortic valve was not  well visualized. Aortic valve regurgitation is not visualized.  5. The inferior vena cava is dilated in size with <50% respiratory variability, suggesting right atrial pressure of 15 mmHg. Comparison(s): No prior Echocardiogram. FINDINGS  Left Ventricle: There is severe hypokinesis of the mid-to-distal anterolateral, septal, and anterior LV segments. The apex appears akinetic. 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 Left ventricular ejection fraction, by estimation, is 20 to 25%. The left ventricle has severely decreased function. The left ventricular internal cavity size was normal in size. There is mild concentric left ventricular hypertrophy. Indeterminate diastolic filling due to E-A fusion. Right Ventricle:  The right ventricular size is normal. Right vetricular wall thickness was not well visualized. Right ventricular systolic function is mildly reduced. Left Atrium: Left atrial size was normal in size. Right Atrium: Right atrial size was normal in size. Pericardium: There is no evidence of pericardial effusion. Mitral Valve: The mitral valve is normal in structure. There is mild thickening of the mitral valve leaflet(s). There is mild calcification of the mitral valve leaflet(s). Mild mitral annular calcification. Trivial mitral valve regurgitation. Tricuspid Valve: The tricuspid valve is normal in structure. Tricuspid valve regurgitation is trivial. Aortic Valve: The aortic valve was not well visualized. Aortic valve regurgitation is not visualized. Pulmonic Valve: The pulmonic valve was not well visualized. Pulmonic valve regurgitation is trivial. Aorta: The aortic root is normal in size and structure. Venous: The inferior vena cava is dilated in size with less than 50% respiratory variability, suggesting right atrial pressure of 15 mmHg. IAS/Shunts: No atrial level shunt detected by color flow Doppler.  LEFT VENTRICLE PLAX 2D LVIDd:         4.09 cm  Diastology LVIDs:         2.61 cm  LV e' medial:    5.55 cm/s LV PW:         1.42 cm  LV E/e' medial:  16.2 LV IVS:        1.19 cm  LV e' lateral:   7.18 cm/s LVOT diam:     2.10 cm  LV E/e' lateral: 12.5 LVOT Area:     3.46 cm  RIGHT VENTRICLE RV S prime:     6.53 cm/s TAPSE (M-mode): 1.5 cm LEFT ATRIUM             Index       RIGHT ATRIUM          Index LA diam:        3.30 cm 1.52 cm/m  RA Area:     8.18 cm LA Vol (A2C):   59.6 ml 27.44 ml/m RA Volume:   17.90 ml 8.24 ml/m LA Vol (A4C):   32.6 ml 15.01 ml/m LA Biplane Vol: 44.6 ml 20.54 ml/m  MITRAL VALVE MV Area (PHT): 6.22 cm    SHUNTS MV Decel Time: 122 msec    Systemic Diam: 2.10 cm MV E velocity: 90.00 cm/s MV A velocity: 47.10 cm/s MV E/A ratio:  1.91 Gwyndolyn Kaufman MD Electronically signed by  Gwyndolyn Kaufman MD Signature Date/Time: 09/16/2020/5:43:42 PM    Final         Scheduled Meds: . albuterol  2.5 mg Nebulization TID  . aspirin  325 mg Oral Daily  . atorvastatin  40 mg Oral QHS  . busPIRone  15 mg Oral BID  . Chlorhexidine Gluconate Cloth  6 each Topical Daily  . [START ON 09/19/2020] influenza vac split quadrivalent PF  0.5 mL Intramuscular Tomorrow-1000  .  insulin aspart  0-20 Units Subcutaneous TID WC  . insulin aspart  0-5 Units Subcutaneous QHS  . insulin glargine  70 Units Subcutaneous Q24H  . mirabegron ER  50 mg Oral Daily  . pantoprazole  40 mg Oral Daily  . PARoxetine  60 mg Oral Daily  . [START ON 09/19/2020] pneumococcal 23 valent vaccine  0.5 mL Intramuscular Tomorrow-1000  . pregabalin  300 mg Oral Q12H  . QUEtiapine  100 mg Oral QHS   Continuous Infusions: . azithromycin 500 mg (09/17/20 0243)  . [START ON 09/18/2020] cefTRIAXone (ROCEPHIN)  IV    . heparin 1,800 Units/hr (09/17/20 1754)  . insulin Stopped (09/16/20 2000)     LOS: 1 day    Time spent: 68mns    JKathie Dike MD [facility]   If 7PM-7AM, please contact night-coverage [email or web address]  09/17/2020, 7:30 PM
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
## 2020-09-18 DIAGNOSIS — R6520 Severe sepsis without septic shock: Secondary | ICD-10-CM

## 2020-09-18 DIAGNOSIS — I5021 Acute systolic (congestive) heart failure: Secondary | ICD-10-CM | POA: Diagnosis not present

## 2020-09-18 DIAGNOSIS — R778 Other specified abnormalities of plasma proteins: Secondary | ICD-10-CM | POA: Diagnosis not present

## 2020-09-18 DIAGNOSIS — A419 Sepsis, unspecified organism: Secondary | ICD-10-CM | POA: Diagnosis not present

## 2020-09-18 LAB — GLUCOSE, CAPILLARY
Glucose-Capillary: 132 mg/dL — ABNORMAL HIGH (ref 70–99)
Glucose-Capillary: 140 mg/dL — ABNORMAL HIGH (ref 70–99)
Glucose-Capillary: 119 mg/dL — ABNORMAL HIGH (ref 70–99)
Glucose-Capillary: 168 mg/dL — ABNORMAL HIGH (ref 70–99)

## 2020-09-18 LAB — URINE CULTURE: Culture: >=100000 — AB

## 2020-09-18 LAB — HEMOGLOBIN A1C
Hgb A1c MFr Bld: 11.1 % — ABNORMAL HIGH (ref 4.8–5.6)
Mean Plasma Glucose: 272 mg/dL

## 2020-09-18 LAB — COMPREHENSIVE METABOLIC PANEL
ALT: 508 U/L — ABNORMAL HIGH (ref 0–44)
AST: 201 U/L — ABNORMAL HIGH (ref 15–41)
Albumin: 2.9 g/dL — ABNORMAL LOW (ref 3.5–5.0)
Alkaline Phosphatase: 111 U/L (ref 38–126)
Anion gap: 14 (ref 5–15)
BUN: 12 mg/dL (ref 6–20)
CO2: 20 mmol/L — ABNORMAL LOW (ref 22–32)
Calcium: 8.5 mg/dL — ABNORMAL LOW (ref 8.9–10.3)
Chloride: 105 mmol/L (ref 98–111)
Creatinine, Ser: 0.78 mg/dL (ref 0.44–1.00)
GFR, Estimated: >60 mL/min (ref >60)
Glucose, Bld: 171 mg/dL — ABNORMAL HIGH (ref 70–99)
Potassium: 3.5 mmol/L (ref 3.5–5.1)
Sodium: 139 mmol/L (ref 135–145)
Total Bilirubin: 1.2 mg/dL (ref 0.3–1.2)
Total Protein: 6.7 g/dL (ref 6.5–8.1)

## 2020-09-18 LAB — HEPARIN LEVEL (UNFRACTIONATED)
Heparin Unfractionated: 0.24 IU/mL — ABNORMAL LOW (ref 0.30–0.70)
Heparin Unfractionated: 0.26 IU/mL — ABNORMAL LOW (ref 0.30–0.70)
Heparin Unfractionated: 0.28 IU/mL — ABNORMAL LOW (ref 0.30–0.70)
Heparin Unfractionated: 0.29 IU/mL — ABNORMAL LOW (ref 0.30–0.70)

## 2020-09-18 LAB — CBC
HCT: 39.2 % (ref 36.0–46.0)
Hemoglobin: 12 g/dL (ref 12.0–15.0)
MCH: 27 pg (ref 26.0–34.0)
MCHC: 30.6 g/dL (ref 30.0–36.0)
MCV: 88.1 fL (ref 80.0–100.0)
Platelets: 156 10*3/uL (ref 150–400)
RBC: 4.45 MIL/uL (ref 3.87–5.11)
RDW: 15.5 % (ref 11.5–15.5)
WBC: 8.2 10*3/uL (ref 4.0–10.5)
nRBC: 0 % (ref 0.0–0.2)

## 2020-09-18 LAB — TROPONIN I (HIGH SENSITIVITY)
Troponin I (High Sensitivity): 1429 ng/L (ref <18)
Troponin I (High Sensitivity): 1416 ng/L (ref <18)

## 2020-09-18 MED ORDER — ASPIRIN EC 81 MG PO TBEC
81.0000 mg | DELAYED_RELEASE_TABLET | Freq: Every day | ORAL | Status: DC
  Administered 2020-09-19 – 2020-09-22 (×4): 81 mg via ORAL
  Filled 2020-09-18 (×4): qty 1

## 2020-09-18 MED ORDER — METOPROLOL SUCCINATE ER 25 MG PO TB24
12.5000 mg | ORAL_TABLET | Freq: Every day | ORAL | Status: DC
  Administered 2020-09-18 – 2020-09-20 (×3): 12.5 mg via ORAL
  Filled 2020-09-18 (×3): qty 1

## 2020-09-18 MED ORDER — SODIUM CHLORIDE 0.9 % IV SOLN: INTRAVENOUS | Status: AC

## 2020-09-18 MED ORDER — HEPARIN BOLUS VIA INFUSION
1250.0000 [IU] | Freq: Once | INTRAVENOUS | Status: AC
  Administered 2020-09-18: 1250 [IU] via INTRAVENOUS
  Filled 2020-09-18: qty 1250
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
## 2020-09-18 NOTE — Progress Notes (Signed)
ANTICOAGULATION CONSULT NOTE -   Pharmacy Consult for heparin Indication: chest pain/ACS  Assessment: 49yo female c/o SOB, being admitted for diabetic ketoacidosis, UTI, and hypoxia, found to have mildly elevated troponin which is now rising (38>771), to begin heparin.  HL 0.29- slightly subtherapeutic   Goal of Therapy:  Heparin level 0.3-0.7 units/ml Monitor platelets by anticoagulation protocol: Yes   Plan:  Increase heparin infusion to 2500 units/hr Heparin level daily CBC daily  Thanks for allowing pharmacy to be a part of this patient's care.  Excell [name], PharmD Clinical Pharmacist 09/18/2020 11:02 PM

## 2020-09-18 NOTE — Evaluation (Signed)
Physical Therapy Evaluation Patient Details Name: Yolanda [name] MRN: [id number] DOB: 1970-12-17 Today's Date: 09/18/2020   History of Present Illness  Yolanda [name]  is a 49 y.o. female, with history of type 2 diabetes mellitus, panic disorder, GERD, essential hypertension, dyslipidemia, psychiatric disorders, and more presents to the ED with a chief complaint of "I was freezing cold."  Patient reports that she had 2 episodes of freezing cold.  The first was on 10/14.  She called EMS, and is not able to explain why she was not transported to the hospital at that time or what their findings were.  Again on October 15 she had another episode of freezing cold at night.  She called EMS again and when they arrived they found her oxygen saturations to be between 40 and 60% they placed her on nonrebreather and brought her into the ER.  Patient reports that for the past couple of days she has had fatigue and weakness.  She does report difficulty breathing.  She cannot think of anything that makes it worse or better.  She has had a cough that is dry.  She reports she has had body aches, and nausea but no vomiting no diarrhea no abdominal pain.  Patient reports that she has not checked her temperature at home but she has had shaking chills.  Patient is not vaccinated for Covid because she "does not trust the vaccine."  Patient denies chest pain, feeling like her heart is racing, and presyncopal symptoms.  Patient denies sick contacts.    Clinical Impression  Pt admitted with above diagnosis. Patient agreeable to participating in PT evaluation today. Mother present throughout session. Patient on 3 LPM O2 at time of eval. Patient reaching for assistance for trunk during supine to sit with HOB elevated. Patient able to complete sit to stand, stand pivot transfer and short distance ambulation using the IV pole but had decreased cadence and short shuffling steps. Patient performed better with improved safety and gait  pattern with RW. Patient reports some of her 6 falls in the past 6 month have been when she gets up from sleeping to walk to the bathroom. She gets dizzy and ends up falling. Pt currently with functional limitations due to the deficits listed below (see PT Problem List). Pt will benefit from skilled PT to increase their independence and safety with mobility to allow discharge to the venue listed below.       Follow Up Recommendations Home health PT;Supervision - Intermittent;Supervision for mobility/OOB    Equipment Recommendations  Rolling walker with 5" wheels;3in1 (PT)    Recommendations for Other Services       Precautions / Restrictions Precautions Precautions: Fall Precaution Comments: reports 6 falls in the last 6 months without serious injury. Restrictions Weight Bearing Restrictions: No      Mobility  Bed Mobility Overal bed mobility: Needs Assistance Bed Mobility: Supine to Sit     Supine to sit: Min assist;HOB elevated     General bed mobility comments: reaching with RUE for assistance with trunk  Transfers Overall transfer level: Needs assistance Equipment used: Ambulation equipment used Transfers: Sit to/from [employer] Transfers Sit to Stand: Min guard Stand pivot transfers: Min guard       General transfer comment: Slow movement, cues for sequencing of steps and placement of hands  Ambulation/Gait Ambulation/Gait assistance: Min guard Gait Distance (Feet): 14 Feet Assistive device: IV Pole (held with both hands) Gait Pattern/deviations: Step-through pattern;Decreased step length - right;Decreased step length -  left;Decreased stride length Gait velocity: decreased   General Gait Details: slow, labored gait; dyspnea on exertion; limited by fatigue; 3 LPM O2; short shuffling steps  Stairs            Wheelchair Mobility    Modified Rankin (Stroke Patients Only)       Balance Overall balance assessment: Needs  assistance Sitting-balance support: Bilateral upper extremity supported;Feet supported Sitting balance-Leahy Scale: Good     Standing balance support: Bilateral upper extremity supported;During functional activity Standing balance-Leahy Scale: Fair Standing balance comment: fair with RW        Pertinent Vitals/Pain Pain Assessment: 0-10 Pain Score: 8  Pain Location: stoach Pain Descriptors / Indicators: Constant;Burning (deep) Pain Intervention(s): Limited activity within patient's tolerance;Monitored during session    [place] expects to be discharged to:: Private residence Living Arrangements: Children (9 year old son) Available Help at Discharge: Family;Available PRN/intermittently Type of Home: Mobile home Home Access: Stairs to enter Entrance Stairs-Rails: Left   Home Layout: One level Home Equipment: Cane - single point      Prior Function Level of Independence: Independent;Needs assistance   Gait / Transfers Assistance Needed: reports walking about 10 minutes in [employer] using cart; reports unable to stand in one place due to pain.  ADL's / Homemaking Assistance Needed: son would help with laundry and sweeping/mopping due to patient's health condition.        Hand Dominance   Dominant Hand: Right    Extremity/Trunk Assessment   Upper Extremity Assessment Upper Extremity Assessment: Generalized weakness    Lower Extremity Assessment Lower Extremity Assessment: Generalized weakness    Cervical / Trunk Assessment Cervical / Trunk Assessment: Normal  Communication   Communication: No difficulties  Cognition Arousal/Alertness: Awake/alert Behavior During Therapy: WFL for tasks assessed/performed Overall Cognitive Status: Within Functional Limits for tasks assessed        General Comments      Exercises     Assessment/Plan    PT Assessment Patient needs continued PT services  PT Problem List Decreased strength;Decreased  mobility;Decreased activity tolerance;Decreased balance;Decreased knowledge of use of DME       PT Treatment Interventions DME instruction;Therapeutic exercise;Gait training;Balance training;Stair training;Therapeutic activities;Patient/family education    PT Goals (Current goals can be found in the Care Plan section)  Acute Rehab PT Goals Patient Stated Goal: Figure out why she gets dizzy; go home. PT Goal Formulation: With patient/family Time For Goal Achievement: 10/02/20 Potential to Achieve Goals: Fair    Frequency Min 3X/week   Barriers to discharge           AM-PAC PT "6 Clicks" Mobility  Outcome Measure Help needed turning from your back to your side while in a flat bed without using bedrails?: A Little Help needed moving from lying on your back to sitting on the side of a flat bed without using bedrails?: A Little Help needed moving to and from a bed to a chair (including a wheelchair)?: A Little Help needed standing up from a chair using your arms (e.g., wheelchair or bedside chair)?: A Little Help needed to walk in hospital room?: A Little Help needed climbing 3-5 steps with a railing? : A Lot 6 Click Score: 17    End of Session Equipment Utilized During Treatment: Oxygen Activity Tolerance: Patient tolerated treatment well;Patient limited by fatigue Patient left: in chair;with call bell/phone within reach;with family/visitor present Nurse Communication: Mobility status PT Visit Diagnosis: History of falling (Z91.81);Unsteadiness on feet (R26.81);Other abnormalities of gait and  mobility (R26.89);Muscle weakness (generalized) (M62.81)    Time: 5170-0174 PT Time Calculation (min) (ACUTE ONLY): 35 min   Charges:   PT Evaluation $PT Eval Moderate Complexity: 1 Mod PT Treatments $Therapeutic Activity: 8-22 mins        Floria Raveling. Hartnett-Rands, MS, PT Per [facility] #[id number] 09/18/2020, 1:46 PM
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
## 2020-09-18 NOTE — Progress Notes (Signed)
Patient is on 3L  with sats in mid to upper 90s.  No distress noted at this time. Bipap is PRN order and not needed at this time, but is at bedside.

## 2020-09-18 NOTE — Progress Notes (Addendum)
PROGRESS NOTE    Yolanda [name]  YPP:[id number] DOB: 1971-04-26 DOA: 09/16/2020 PCP: Lucia Gaskins, MD    Brief Narrative:  Yolanda [name]  is a 49 y.o. female, with history of type 2 diabetes mellitus, panic disorder, GERD, essential hypertension, dyslipidemia, psychiatric disorders, and more presents to the ED with a chief complaint of "I was freezing cold."  Patient reports that she had 2 episodes of freezing cold.  The first was on 10/14.  She called EMS, and is not able to explain why she was not transported to the hospital at that time or what their findings were.  Again on October 15 she had another episode of freezing cold at night.  She called EMS again and when they arrived they found her oxygen saturations to be between 40 and 60% they placed her on nonrebreather and brought her into the ER.  Patient reports that for the past couple of days she has had fatigue and weakness.  She does report difficulty breathing.  She cannot think of anything that makes it worse or better.  She has had a cough that is dry.  She reports she has had body aches, and nausea but no vomiting no diarrhea no abdominal pain.  Patient reports that she has not checked her temperature at home but she has had shaking chills.  Patient is not vaccinated for Covid because she "does not trust the vaccine."  Patient denies chest pain, feeling like her heart is racing, and presyncopal symptoms.  Patient denies sick contacts.   Patient does admit that since she has not been feeling well she has likely missed several doses of her insulin.  Patient is on intensive insulin regimen with 20 units of Humalog with each meal, and 7 units of Tresiba at night.  Patient reports that she has had polyuria and polydipsia.  She does not know what her sugars have been running at home.  Her last hemoglobin A1c in our system was almost 10, but that was a couple years ago.   Assessment & Plan:   Active Problems:   Hypertension   Bipolar 1  disorder (HCC)   Type 2 diabetes mellitus, uncontrolled (HCC)   Urinary tract infection, site not specified   Severe sepsis (HCC)   Acute respiratory failure with hypoxia (HCC)   NSTEMI (non-ST elevated myocardial infarction) ([place])   DKA, type 2 ([place])   CAP (community acquired pneumonia)   AKI (acute kidney injury) ([place])   Acute systolic CHF (congestive heart failure) (HCC)   Severe sepsis due to E. coli -Patient presented with severe sepsis with fever (temp of 105F), tachycardia, leukocytosis -Source is likely pneumonia/UTI -Lactic acid elevated at 4.7 on admission, which normalized with IV fluids -Continue on IV antibiotics -Blood cultures show 1 out of 2 positive cultures for E. Coli -Since she still has tachycardia with soft blood pressures, will continue IV fluids for another 12 hours.   Acute respiratory failure with hypoxia -Oxygen saturation was reportedly down to the 40s on room air on admission -Concern for developing pneumonia -CT chest negative for pulmonary embolus, but did demonstrate bilateral infiltrates -Initially on BiPAP, has since been weaned down to nasal cannula -Covid test is negative x 2   NSTEMI -Initial troponin found to be 38, which then peaked at 2886 -EKG shows sinus tachycardia without acute ischemic changes -Patient denies any chest pain at this time. -Discussed with Dr. Debara Pickett and it was felt with degree of rise of troponin, she likely has had  an infarct -She is on heparin infusion, aspirin and beta-blockers -Holding statin for now in light of elevated LFTs -Echo shows EF of 20 to 25% with wall motion abnormalities.   -Cardiology following, plans for cardiac catheterization later this week   Acute systolic congestive heart failure -EF of 20 to 25% on echocardiogram -Potentially Takotsubo cardiomyopathy versus LAD disease -Plans are for cardiac catheterization later this week -Appreciate cardiology assistance -Does not appear volume overloaded  at this time.   Diabetic ketoacidosis -Blood sugar of 494 on admission with a serum bicarb of 15 and an anion gap of 23 -She is chronically on Tresiba, Ozempic, canagliflozin, Metformin. -Patient was placed on DKA protocol with IV insulin and IV fluids -Anion gap has closed and she has been transitioned back to subcutaneous insulin -Blood sugars have been stable.   Community-acquired pneumonia -Patient has been having cough and is significantly hypoxic -Currently on IV antibiotics   Urinary tract infection due to E. coli -Urine culture positive for E. coli -IV ceftriaxone -Anticipate transitioning to oral antibiotics in the next 24 hours if she remains afebrile   Lactic acidosis -Likely related to sepsis and Metformin use -Lactic acid has normalized with IV fluids   Acute kidney injury -Baseline creatinine is 0.9 -Creatinine on admission 1.39 -Likely related to dehydration/sepsis -Creatinine improved to 0.7 with hydration  Elevated LFTs -s/p cholecystectomy -no significant RUQ tenderness -suspect this is related to sepsis -Trending down, continue to follow   Hypertension -Started on low-dose Toprol  Diarrhea -Reports multiple loose stools since admission -C. difficile negative -Use Imodium as needed  Hypokalemia. -replaced   DVT prophylaxis: SCDs Start: 09/16/20 0550, Heparin infusion  Code Status: Full code Family Communication: Discussed with sister at the bedside Disposition Plan: Status is: Inpatient  Remains inpatient appropriate because:Inpatient level of care appropriate due to severity of illness   Dispo: The patient is from: Home              Anticipated d/c is to: Home              Anticipated d/c date is: 3 days              Patient currently is not medically stable to d/c.    Consultants:  Cardiology  Procedures:  Echo  Antimicrobials:  Ceftriaxone 10/16 > Azithromycin 10/16 >   Subjective: She is feeling better today as compared  to yesterday.  Still has some cough and shortness of breath.  No nausea or vomiting.  Has some loose stool after eating.  Objective: Vitals:   09/18/20 1200 09/18/20 1235 09/18/20 1348 09/18/20 1655  BP: 117/66 117/66    Pulse: (!) 112 (!) 122  (!) 112  Resp: (!) 28   (!) 24  Temp:    98.8 F (37.1 C)  TempSrc:    Oral  SpO2: 94%  98%   Weight:      Height:        Intake/Output Summary (Last 24 hours) at 09/18/2020 1838 Last data filed at 09/18/2020 1600 Gross per 24 hour  Intake 1522.83 ml  Output 5700 ml  Net -4177.17 ml   Filed Weights   09/16/20 0216 09/16/20 1000 09/18/20 0406  Weight: 105 kg 107.3 kg 108.8 kg    Examination:  General exam: Alert, awake, oriented x 3 Respiratory system: crackles at bases. Respiratory effort normal. Cardiovascular system:RRR. No murmurs, rubs, gallops. Gastrointestinal system: Abdomen is nondistended, soft and nontender. No organomegaly or masses felt. Normal bowel sounds  heard. Central nervous system: Alert and oriented. No focal neurological deficits. Extremities: No C/C/E, +pedal pulses Skin: No rashes, lesions or ulcers Psychiatry: Judgement and insight appear normal. Mood & affect appropriate.      Data Reviewed: I have personally reviewed following labs and imaging studies  CBC: Recent Labs  Lab 09/16/20 0203 09/17/20 0600 09/17/20 1014 09/18/20 0528  WBC 11.3* 11.4* 9.9 8.2  NEUTROABS 4.8 8.6*  --   --   HGB 15.0 12.5 10.7* 12.0  HCT 48.2* 40.5 34.3* 39.2  MCV 88.8 88.6 89.3 88.1  PLT 291 145* 135* 147   Basic Metabolic Panel: Recent Labs  Lab 09/16/20 1207 09/16/20 1429 09/16/20 1900 09/17/20 0600 09/18/20 0528  NA 141 138 138 139 139  K 3.2* 3.3* 2.9* 4.1 3.5  CL 111 109 107 106 105  CO2 18* 20* 20* 20* 20*  GLUCOSE 196* 207* 145* 203* 171*  BUN 19 20 18 17 12   CREATININE 0.94 1.08* 0.95 0.89 0.78  CALCIUM 6.7* 7.9* 8.2* 8.2* 8.5*  MG  --   --   --  1.8  --    GFR: Estimated Creatinine  Clearance: 110.5 mL/min (by C-G formula based on SCr of 0.78 mg/dL). Liver Function Tests: Recent Labs  Lab 09/16/20 0203 09/17/20 0600 09/18/20 0528  AST 73* 925* 201*  ALT 65* 756* 508*  ALKPHOS 107 98 111  BILITOT 0.9 1.1 1.2  PROT 7.6 6.1* 6.7  ALBUMIN 3.6 2.9* 2.9*   No results for input(s): LIPASE, AMYLASE in the last 168 hours. No results for input(s): AMMONIA in the last 168 hours. Coagulation Profile: Recent Labs  Lab 09/17/20 0600  INR 1.1   Cardiac Enzymes: No results for input(s): CKTOTAL, CKMB, CKMBINDEX, TROPONINI in the last 168 hours. BNP (last 3 results) No results for input(s): PROBNP in the last 8760 hours. HbA1C: Recent Labs    09/16/20 0742  HGBA1C 11.1*   CBG: Recent Labs  Lab 09/17/20 1602 09/17/20 2134 09/18/20 0744 09/18/20 1100 09/18/20 1652  GLUCAP 178* 183* 132* 140* 119*   Lipid Profile: Recent Labs    09/16/20 0203  TRIG 784*   Thyroid Function Tests: No results for input(s): TSH, T4TOTAL, FREET4, T3FREE, THYROIDAB in the last 72 hours. Anemia Panel: Recent Labs    09/16/20 0203  FERRITIN 70   Sepsis Labs: Recent Labs  Lab 09/16/20 0203 09/16/20 0251 09/16/20 0507 09/16/20 0742 09/17/20 0600 09/17/20 1634  PROCALCITON 12.79  --   --   --  44.09  --   LATICACIDVEN  --  4.7* 4.6* 2.8*  --  1.8    Recent Results (from the past 240 hour(s))  Respiratory Panel by RT PCR (Flu A&B, Covid) - Nasopharyngeal Swab     Status: None   Collection Time: 09/16/20  2:12 AM   Specimen: Nasopharyngeal Swab  Result Value Ref Range Status   SARS Coronavirus 2 by RT PCR NEGATIVE NEGATIVE Final    Comment: (NOTE) SARS-CoV-2 target nucleic acids are NOT DETECTED.  The SARS-CoV-2 RNA is generally detectable in upper respiratoy specimens during the acute phase of infection. The lowest concentration of SARS-CoV-2 viral copies this assay can detect is 131 copies/mL. A negative result does not preclude SARS-Cov-2 infection and should  not be used as the sole basis for treatment or other patient management decisions. A negative result may occur with  improper specimen collection/handling, submission of specimen other than nasopharyngeal swab, presence of viral mutation(s) within the areas targeted by this assay,  and inadequate number of viral copies (<131 copies/mL). A negative result must be combined with clinical observations, patient history, and epidemiological information. The expected result is Negative.  Fact Sheet for Patients:  PinkCheek.be  Fact Sheet for Healthcare Providers:  GravelBags.it  This test is no t yet approved or cleared by the [place] FDA and  has been authorized for detection and/or diagnosis of SARS-CoV-2 by FDA under an Emergency Use Authorization (EUA). This EUA will remain  in effect (meaning this test can be used) for the duration of the COVID-19 declaration under Section 564(b)(1) of the Act, 21 U.S.C. section 360bbb-3(b)(1), unless the authorization is terminated or revoked sooner.     Influenza A by PCR NEGATIVE NEGATIVE Final   Influenza B by PCR NEGATIVE NEGATIVE Final    Comment: (NOTE) The Xpert Xpress SARS-CoV-2/FLU/RSV assay is intended as an aid in  the diagnosis of influenza from Nasopharyngeal swab specimens and  should not be used as a sole basis for treatment. Nasal washings and  aspirates are unacceptable for Xpert Xpress SARS-CoV-2/FLU/RSV  testing.  Fact Sheet for Patients: PinkCheek.be  Fact Sheet for Healthcare Providers: GravelBags.it  This test is not yet approved or cleared by the [place] FDA and  has been authorized for detection and/or diagnosis of SARS-CoV-2 by  FDA under an Emergency Use Authorization (EUA). This EUA will remain  in effect (meaning this test can be used) for the duration of the  Covid-19 declaration under  Section 564(b)(1) of the Act, 21  U.S.C. section 360bbb-3(b)(1), unless the authorization is  terminated or revoked. Performed at [facility], [address] [place], [place] [address]   Urine culture     Status: Abnormal   Collection Time: 09/16/20  2:35 AM   Specimen: Urine, Catheterized  Result Value Ref Range Status   Specimen Description   Final    URINE, CATHETERIZED Performed at [facility], [address] [place], [place] [address]    Special Requests   Final    NONE Performed at [facility], [address] [place], [place] [address]    Culture >=100,000 COLONIES/mL ESCHERICHIA COLI (A)  Final   Report Status 09/18/2020 FINAL  Final   Organism ID, Bacteria ESCHERICHIA COLI (A)  Final      Susceptibility   Escherichia coli - MIC*    AMPICILLIN 4 SENSITIVE Sensitive     CEFAZOLIN <=4 SENSITIVE Sensitive     CEFTRIAXONE <=0.25 SENSITIVE Sensitive     CIPROFLOXACIN <=0.25 SENSITIVE Sensitive     GENTAMICIN <=1 SENSITIVE Sensitive     IMIPENEM <=0.25 SENSITIVE Sensitive     NITROFURANTOIN <=16 SENSITIVE Sensitive     TRIMETH/SULFA <=20 SENSITIVE Sensitive     AMPICILLIN/SULBACTAM <=2 SENSITIVE Sensitive     PIP/TAZO <=4 SENSITIVE Sensitive     * >=100,000 COLONIES/mL ESCHERICHIA COLI  Blood Culture (routine x 2)     Status: Abnormal (Preliminary result)   Collection Time: 09/16/20  2:52 AM   Specimen: Left Antecubital; Blood  Result Value Ref Range Status   Specimen Description   Final    LEFT ANTECUBITAL Performed at [facility], [address] [place], [place] [address]    Special Requests   Final    BOTTLES DRAWN AEROBIC AND ANAEROBIC Blood Culture adequate volume Performed at [facility], [address], [place], [place] [address]    Culture  Setup Time   Final    GRAM NEGATIVE RODS CRITICAL RESULT CALLED TO, READ BACK BY AND VERIFIED  WITH: RN C ALSTON [id number] AT 859 AM BY CM    Culture (A)  Final    ESCHERICHIA COLI SUSCEPTIBILITIES TO FOLLOW Performed  at [facility], [place] [address] [place], [place] [address]    Report Status PENDING  Incomplete  Blood Culture ID Panel (Reflexed)     Status: Abnormal   Collection Time: 09/16/20  2:52 AM  Result Value Ref Range Status   Enterococcus faecalis NOT DETECTED NOT DETECTED Final   Enterococcus Faecium NOT DETECTED NOT DETECTED Final   Listeria monocytogenes NOT DETECTED NOT DETECTED Final   Staphylococcus species NOT DETECTED NOT DETECTED Final   Staphylococcus aureus (BCID) NOT DETECTED NOT DETECTED Final   Staphylococcus epidermidis NOT DETECTED NOT DETECTED Final   Staphylococcus lugdunensis NOT DETECTED NOT DETECTED Final   Streptococcus species NOT DETECTED NOT DETECTED Final   Streptococcus agalactiae NOT DETECTED NOT DETECTED Final   Streptococcus pneumoniae NOT DETECTED NOT DETECTED Final   Streptococcus pyogenes NOT DETECTED NOT DETECTED Final   A.calcoaceticus-baumannii NOT DETECTED NOT DETECTED Final   Bacteroides fragilis NOT DETECTED NOT DETECTED Final   Enterobacterales DETECTED (A) NOT DETECTED Final    Comment: Enterobacterales represent a large order of gram negative bacteria, not a single organism. CRITICAL RESULT CALLED TO, READ BACK BY AND VERIFIED WITH: RN C ALSTON [id number] AT 900 AM BY CM    Enterobacter cloacae complex NOT DETECTED NOT DETECTED Final   Escherichia coli DETECTED (A) NOT DETECTED Final    Comment: CRITICAL RESULT CALLED TO, READ BACK BY AND VERIFIED WITH: RN C ALSTON [id number] AT 900 AM BY CM    Klebsiella aerogenes NOT DETECTED NOT DETECTED Final   Klebsiella oxytoca NOT DETECTED NOT DETECTED Final   Klebsiella pneumoniae NOT DETECTED NOT DETECTED Final   Proteus species NOT DETECTED NOT DETECTED Final   Salmonella species NOT DETECTED NOT DETECTED Final   Serratia marcescens NOT DETECTED NOT DETECTED Final   Haemophilus influenzae NOT DETECTED NOT DETECTED Final   Neisseria meningitidis NOT DETECTED NOT DETECTED Final   Pseudomonas aeruginosa  NOT DETECTED NOT DETECTED Final   Stenotrophomonas maltophilia NOT DETECTED NOT DETECTED Final   Candida albicans NOT DETECTED NOT DETECTED Final   Candida auris NOT DETECTED NOT DETECTED Final   Candida glabrata NOT DETECTED NOT DETECTED Final   Candida krusei NOT DETECTED NOT DETECTED Final   Candida parapsilosis NOT DETECTED NOT DETECTED Final   Candida tropicalis NOT DETECTED NOT DETECTED Final   Cryptococcus neoformans/gattii NOT DETECTED NOT DETECTED Final   CTX-M ESBL NOT DETECTED NOT DETECTED Final   Carbapenem resistance IMP NOT DETECTED NOT DETECTED Final   Carbapenem resistance KPC NOT DETECTED NOT DETECTED Final   Carbapenem resistance NDM NOT DETECTED NOT DETECTED Final   Carbapenem resist OXA 48 LIKE NOT DETECTED NOT DETECTED Final   Carbapenem resistance VIM NOT DETECTED NOT DETECTED Final    Comment: Performed at [facility], [address], [place], [place] [address]  Blood Culture (routine x 2)     Status: None (Preliminary result)   Collection Time: 09/16/20  3:09 AM   Specimen: BLOOD LEFT HAND  Result Value Ref Range Status   Specimen Description BLOOD LEFT HAND  Final   Special Requests   Final    BOTTLES DRAWN AEROBIC AND ANAEROBIC Blood Culture adequate volume   Culture   Final    NO GROWTH 2 DAYS Performed at [facility], [address], [place], [place] [address]    Report  Status PENDING  Incomplete  Respiratory Panel by RT PCR (Flu A&B, Covid) - Nasopharyngeal Swab     Status: None   Collection Time: 09/16/20  6:03 AM   Specimen: Nasopharyngeal Swab  Result Value Ref Range Status   SARS Coronavirus 2 by RT PCR NEGATIVE NEGATIVE Final    Comment: (NOTE) SARS-CoV-2 target nucleic acids are NOT DETECTED.  The SARS-CoV-2 RNA is generally detectable in upper respiratoy specimens during the acute phase of infection. The lowest concentration of SARS-CoV-2 viral copies this assay can detect is 131 copies/mL. A negative result does not preclude  SARS-Cov-2 infection and should not be used as the sole basis for treatment or other patient management decisions. A negative result may occur with  improper specimen collection/handling, submission of specimen other than nasopharyngeal swab, presence of viral mutation(s) within the areas targeted by this assay, and inadequate number of viral copies (<131 copies/mL). A negative result must be combined with clinical observations, patient history, and epidemiological information. The expected result is Negative.  Fact Sheet for Patients:  PinkCheek.be  Fact Sheet for Healthcare Providers:  GravelBags.it  This test is no t yet approved or cleared by the [place] FDA and  has been authorized for detection and/or diagnosis of SARS-CoV-2 by FDA under an Emergency Use Authorization (EUA). This EUA will remain  in effect (meaning this test can be used) for the duration of the COVID-19 declaration under Section 564(b)(1) of the Act, 21 U.S.C. section 360bbb-3(b)(1), unless the authorization is terminated or revoked sooner.     Influenza A by PCR NEGATIVE NEGATIVE Final   Influenza B by PCR NEGATIVE NEGATIVE Final    Comment: (NOTE) The Xpert Xpress SARS-CoV-2/FLU/RSV assay is intended as an aid in  the diagnosis of influenza from Nasopharyngeal swab specimens and  should not be used as a sole basis for treatment. Nasal washings and  aspirates are unacceptable for Xpert Xpress SARS-CoV-2/FLU/RSV  testing.  Fact Sheet for Patients: PinkCheek.be  Fact Sheet for Healthcare Providers: GravelBags.it  This test is not yet approved or cleared by the [place] FDA and  has been authorized for detection and/or diagnosis of SARS-CoV-2 by  FDA under an Emergency Use Authorization (EUA). This EUA will remain  in effect (meaning this test can be used) for the duration of the    Covid-19 declaration under Section 564(b)(1) of the Act, 21  U.S.C. section 360bbb-3(b)(1), unless the authorization is  terminated or revoked. Performed at [facility], [address], [place], [place] [address]   MRSA PCR Screening     Status: None   Collection Time: 09/16/20  9:45 AM   Specimen: Nasal Mucosa; Nasopharyngeal  Result Value Ref Range Status   MRSA by PCR NEGATIVE NEGATIVE Final    Comment:        The GeneXpert MRSA Assay (FDA approved for NASAL specimens only), is one component of a comprehensive MRSA colonization surveillance program. It is not intended to diagnose MRSA infection nor to guide or monitor treatment for MRSA infections. Performed at [facility], [address] [place],  [address]   C Difficile Quick Screen w PCR reflex     Status: None   Collection Time: 09/17/20  3:54 PM   Specimen: STOOL  Result Value Ref Range Status   C Diff antigen NEGATIVE NEGATIVE Final   C Diff toxin NEGATIVE NEGATIVE Final   C Diff interpretation No C. difficile detected.  Final    Comment: Performed at [facility], [phone]  [address] [place], [place] [address]         Radiology Studies: No results found.      Scheduled Meds:  albuterol  2.5 mg Nebulization TID   [START ON 09/19/2020] aspirin EC  81 mg Oral Daily   busPIRone  15 mg Oral BID   Chlorhexidine Gluconate Cloth  6 each Topical Daily   [START ON 09/19/2020] influenza vac split quadrivalent PF  0.5 mL Intramuscular Tomorrow-1000   insulin aspart  0-20 Units Subcutaneous TID WC   insulin aspart  0-5 Units Subcutaneous QHS   insulin glargine  70 Units Subcutaneous Q24H   metoprolol succinate  12.5 mg Oral Daily   mirabegron ER  50 mg Oral Daily   pantoprazole  40 mg Oral Daily   PARoxetine  60 mg Oral Daily   [START ON 09/19/2020] pneumococcal 23 valent vaccine  0.5 mL Intramuscular Tomorrow-1000   pregabalin  300 mg Oral Q12H   QUEtiapine  100 mg Oral QHS   Continuous Infusions:   sodium chloride 75 mL/hr at 09/18/20 1711   azithromycin Stopped (09/18/20 0359)   cefTRIAXone (ROCEPHIN)  IV Stopped (09/18/20 0109)   heparin 2,350 Units/hr (09/18/20 1711)   insulin Stopped (09/16/20 2000)     LOS: 2 days    Time spent: 32mns    JKathie Dike MD [facility]   If 7PM-7AM, please contact night-coverage [email or web address]  09/18/2020, 6:38 PM

## 2020-09-18 NOTE — Plan of Care (Signed)
  Problem: Acute Rehab PT Goals(only PT should resolve) Goal: Pt Will Go Supine/Side To Sit Outcome: Progressing Flowsheets (Taken 09/18/2020 1349) Pt will go Supine/Side to Sit: with min guard assist Goal: Pt Will Go Sit To Supine/Side Outcome: Progressing Flowsheets (Taken 09/18/2020 1349) Pt will go Sit to Supine/Side: with min guard assist Goal: Patient Will Transfer Sit To/From Stand Outcome: Progressing Flowsheets (Taken 09/18/2020 1349) Patient will transfer sit to/from stand: with min guard assist Goal: Pt Will Transfer Bed To Chair/Chair To Bed Outcome: Progressing Flowsheets (Taken 09/18/2020 1349) Pt will Transfer Bed to Chair/Chair to Bed: min guard assist Goal: Pt Will Ambulate Outcome: Progressing Flowsheets (Taken 09/18/2020 1349) Pt will Ambulate:  50 feet  with supervision  with rolling walker  with least restrictive assistive device Goal: Pt Will Go Up/Down Stairs Outcome: Progressing Flowsheets (Taken 09/18/2020 1349) Pt will Go Up / Down Stairs:  3-5 stairs  with min guard assist  with rail(s)  Pamala Hurry D. Hartnett-Rands, MS, PT Per [place] [phone] 09/18/2020

## 2020-09-18 NOTE — Progress Notes (Addendum)
ANTICOAGULATION CONSULT NOTE - Follow Up Consult  Pharmacy Consult for heparin Indication: NSTEMI  Labs: Recent Labs    09/16/20 0203 09/16/20 0203 09/16/20 0252 09/16/20 1207 09/16/20 1428 09/16/20 1429 09/16/20 1900 09/16/20 2059 09/16/20 2059 09/17/20 0600 09/17/20 0601 09/17/20 1014 09/17/20 1444 09/17/20 1634 09/17/20 2248  HGB 15.0   < >  --   --   --   --   --   --   --  12.5  --  10.7*  --   --   --   HCT 48.2*  --   --   --   --   --   --   --   --  40.5  --  34.3*  --   --   --   PLT 291  --   --   --   --   --   --   --   --  145*  --  135*  --   --   --   LABPROT  --   --   --   --   --   --   --   --   --  14.0  --   --   --   --   --   INR  --   --   --   --   --   --   --   --   --  1.1  --   --   --   --   --   HEPARINUNFRC  --   --    < >  --    < >  --   --  0.22*   < >  --  0.20*  --  <0.10*  --  0.24*  CREATININE 1.37*  --    < >  --   --  1.08* 0.95  --   --  0.89  --   --   --   --   --   TROPONINIHS 38*  --    < >   < >  --   --  2,445* 2,886*  --   --   --   --   --  1,865*  --    < > = values in this interval not displayed.    Assessment: 49yo female remains subtherapeutic on heparin after rate changes; no gtt issues or signs of bleeding per RN.  Goal of Therapy:  Heparin level 0.3-0.7 units/ml   Plan:  Will increase heparin gtt by 2 units/kg/hr to 2000 units/hr and check level in 6 hours.    Wynona Neat, PharmD, BCPS  09/18/2020,12:17 AM   Addendum: Heparin level increasing slowly but still below goal at 0.26.  Will increase heparin gtt by 2 units/kg/hr to 2200 units/hr and check level in 6 hours.     VB 6:44 AM

## 2020-09-19 DIAGNOSIS — R778 Other specified abnormalities of plasma proteins: Secondary | ICD-10-CM | POA: Diagnosis not present

## 2020-09-19 DIAGNOSIS — I5021 Acute systolic (congestive) heart failure: Secondary | ICD-10-CM | POA: Diagnosis not present

## 2020-09-19 LAB — CBC
HCT: 35.6 % — ABNORMAL LOW (ref 36.0–46.0)
Hemoglobin: 10.6 g/dL — ABNORMAL LOW (ref 12.0–15.0)
MCH: 27 pg (ref 26.0–34.0)
MCHC: 29.8 g/dL — ABNORMAL LOW (ref 30.0–36.0)
MCV: 90.6 fL (ref 80.0–100.0)
Platelets: 133 10*3/uL — ABNORMAL LOW (ref 150–400)
RBC: 3.93 MIL/uL (ref 3.87–5.11)
RDW: 15.6 % — ABNORMAL HIGH (ref 11.5–15.5)
WBC: 8.4 10*3/uL (ref 4.0–10.5)
nRBC: 0 % (ref 0.0–0.2)

## 2020-09-19 LAB — BRAIN NATRIURETIC PEPTIDE: B Natriuretic Peptide: 345 pg/mL — ABNORMAL HIGH (ref 0.0–100.0)

## 2020-09-19 LAB — COMPREHENSIVE METABOLIC PANEL
ALT: 275 U/L — ABNORMAL HIGH (ref 0–44)
AST: 62 U/L — ABNORMAL HIGH (ref 15–41)
Albumin: 2.6 g/dL — ABNORMAL LOW (ref 3.5–5.0)
Alkaline Phosphatase: 131 U/L — ABNORMAL HIGH (ref 38–126)
Anion gap: 9 (ref 5–15)
BUN: 13 mg/dL (ref 6–20)
CO2: 21 mmol/L — ABNORMAL LOW (ref 22–32)
Calcium: 8.1 mg/dL — ABNORMAL LOW (ref 8.9–10.3)
Chloride: 110 mmol/L (ref 98–111)
Creatinine, Ser: 0.73 mg/dL (ref 0.44–1.00)
GFR, Estimated: >60 mL/min (ref >60)
Glucose, Bld: 97 mg/dL (ref 70–99)
Potassium: 3.5 mmol/L (ref 3.5–5.1)
Sodium: 140 mmol/L (ref 135–145)
Total Bilirubin: 0.8 mg/dL (ref 0.3–1.2)
Total Protein: 6.1 g/dL — ABNORMAL LOW (ref 6.5–8.1)

## 2020-09-19 LAB — HEPARIN LEVEL (UNFRACTIONATED)
Heparin Unfractionated: 0.42 IU/mL (ref 0.30–0.70)
Heparin Unfractionated: 0.51 IU/mL (ref 0.30–0.70)

## 2020-09-19 LAB — GLUCOSE, CAPILLARY
Glucose-Capillary: 81 mg/dL (ref 70–99)
Glucose-Capillary: 116 mg/dL — ABNORMAL HIGH (ref 70–99)
Glucose-Capillary: 91 mg/dL (ref 70–99)
Glucose-Capillary: 169 mg/dL — ABNORMAL HIGH (ref 70–99)

## 2020-09-19 MED ORDER — LOSARTAN POTASSIUM 25 MG PO TABS
12.5000 mg | ORAL_TABLET | Freq: Every day | ORAL | Status: DC
  Administered 2020-09-19 – 2020-09-21 (×3): 12.5 mg via ORAL
  Filled 2020-09-19 (×3): qty 1

## 2020-09-19 MED ORDER — POTASSIUM CHLORIDE CRYS ER 20 MEQ PO TBCR
40.0000 meq | EXTENDED_RELEASE_TABLET | Freq: Once | ORAL | Status: AC
  Administered 2020-09-19: 40 meq via ORAL
  Filled 2020-09-19: qty 2

## 2020-09-19 MED ORDER — ALUM & MAG HYDROXIDE-SIMETH 200-200-20 MG/5ML PO SUSP
30.0000 mL | ORAL | Status: DC | PRN
  Administered 2020-09-19 – 2020-09-21 (×2): 30 mL via ORAL
  Filled 2020-09-19 (×2): qty 30

## 2020-09-19 MED ORDER — AMOXICILLIN-POT CLAVULANATE 875-125 MG PO TABS
1.0000 | ORAL_TABLET | Freq: Two times a day (BID) | ORAL | Status: DC
  Administered 2020-09-19 – 2020-09-21 (×5): 1 via ORAL
  Filled 2020-09-19 (×5): qty 1

## 2020-09-19 MED ORDER — AZITHROMYCIN 250 MG PO TABS
500.0000 mg | ORAL_TABLET | Freq: Every day | ORAL | Status: DC
  Administered 2020-09-19 – 2020-09-21 (×3): 500 mg via ORAL
  Filled 2020-09-19 (×3): qty 2

## 2020-09-19 MED ORDER — SODIUM CHLORIDE 0.9% FLUSH
3.0000 mL | Freq: Two times a day (BID) | INTRAVENOUS | Status: DC
  Administered 2020-09-19 – 2020-09-21 (×6): 3 mL via INTRAVENOUS

## 2020-09-19 NOTE — Progress Notes (Signed)
PROGRESS NOTE    Yolanda [name]  IRS:[id number] DOB: 10/17/1971 DOA: 09/16/2020 PCP: Lucia Gaskins, MD    Brief Narrative:  Yolanda [name]  is a 49 y.o. female, with history of type 2 diabetes mellitus, panic disorder, GERD, essential hypertension, dyslipidemia, psychiatric disorders, and more presents to the ED with a chief complaint of "I was freezing cold."  Patient reports that she had 2 episodes of freezing cold.  The first was on 10/14.  She called EMS, and is not able to explain why she was not transported to the hospital at that time or what their findings were.  Again on October 15 she had another episode of freezing cold at night.  She called EMS again and when they arrived they found her oxygen saturations to be between 40 and 60% they placed her on nonrebreather and brought her into the ER.  Patient reports that for the past couple of days she has had fatigue and weakness.  She does report difficulty breathing.  She cannot think of anything that makes it worse or better.  She has had a cough that is dry.  She reports she has had body aches, and nausea but no vomiting no diarrhea no abdominal pain.  Patient reports that she has not checked her temperature at home but she has had shaking chills.  Patient is not vaccinated for Covid because she "does not trust the vaccine."  Patient denies chest pain, feeling like her heart is racing, and presyncopal symptoms.  Patient denies sick contacts.   Patient does admit that since she has not been feeling well she has likely missed several doses of her insulin.  Patient is on intensive insulin regimen with 20 units of Humalog with each meal, and 7 units of Tresiba at night.  Patient reports that she has had polyuria and polydipsia.  She does not know what her sugars have been running at home.  Her last hemoglobin A1c in our system was almost 10, but that was a couple years ago.   Assessment & Plan:   Active Problems:   Hypertension   Bipolar 1  disorder (HCC)   Type 2 diabetes mellitus, uncontrolled (HCC)   Urinary tract infection, site not specified   Severe sepsis (HCC)   Acute respiratory failure with hypoxia (HCC)   NSTEMI (non-ST elevated myocardial infarction) ([place])   DKA, type 2 ([place])   CAP (community acquired pneumonia)   AKI (acute kidney injury) ([place])   Acute systolic CHF (congestive heart failure) (HCC)   Severe sepsis due to E. coli -Patient presented with severe sepsis with fever (temp of 105F), tachycardia, leukocytosis -Source is likely pneumonia/UTI -Lactic acid elevated at 4.7 on admission, which normalized with IV fluids -Initially treated with IV antibiotics which have since been transitioned to orals based on cultures sensitivities -Blood cultures show 1 out of 2 positive cultures for E. Coli -hemodynamics are stabilizing    Acute respiratory failure with hypoxia -Oxygen saturation was reportedly down to the 40s on room air on admission -Concern for developing pneumonia -CT chest negative for pulmonary embolus, but did demonstrate bilateral infiltrates -Initially on BiPAP, has since been weaned down to room air -Covid test is negative x 2   NSTEMI -Initial troponin found to be 38, which then peaked at 2886 -EKG shows sinus tachycardia without acute ischemic changes -Patient denies any chest pain at this time. -Discussed with Dr. Debara Pickett and it was felt with degree of rise of troponin, she likely has had an  infarct -She is on heparin infusion, aspirin and beta-blockers, ARB -Holding statin for now in light of elevated LFTs -Echo shows EF of 20 to 25% with wall motion abnormalities.   -Cardiology following, plans for cardiac catheterization tomorrow -will initiate transfer to [facility] today   Acute systolic congestive heart failure -EF of 20 to 25% on echocardiogram -Potentially Takotsubo cardiomyopathy versus LAD disease -on BB and ARB -Plans are for cardiac catheterization tomorrow -will initiate  transfer to [facility] today -Appreciate cardiology assistance -Does not appear volume overloaded at this time.   Diabetic ketoacidosis, type 2 -Blood sugar of 494 on admission with a serum bicarb of 15 and an anion gap of 23 -She is chronically on Tresiba, Ozempic, canagliflozin, Metformin. -Patient was placed on DKA protocol with IV insulin and IV fluids -Anion gap has closed and she has been transitioned back to subcutaneous insulin -Blood sugars have been stable.   Community-acquired pneumonia -Patient has been having cough and is significantly hypoxic -Currently on IV antibiotics   Urinary tract infection due to E. coli -Urine culture positive for E. coli -IV ceftriaxone -Change to augmentin based on sensitivities   Lactic acidosis -Likely related to sepsis and Metformin use -Lactic acid normalized with IV fluids   Acute kidney injury -Baseline creatinine is 0.9 -Creatinine on admission 1.39 -Likely related to dehydration/sepsis -Creatinine improved to 0.7 with hydration  Elevated LFTs -s/p cholecystectomy -no significant RUQ tenderness -suspect this is related to sepsis -Trending down, continue to follow   Hypertension -Started on low-dose Toprol and losartan -Blood pressures have been stable  Diarrhea -Reports multiple loose stools since admission -C. difficile negative -Use Imodium as needed -Overall improved  Hypokalemia. -replaced   DVT prophylaxis: SCDs Start: 09/16/20 0550, Heparin infusion  Code Status: Full code Family Communication: Discussed with patient Disposition Plan: Status is: Inpatient, Transfer to [facility] for cardiac cath  Remains inpatient appropriate because:Inpatient level of care appropriate due to severity of illness   Dispo: The patient is from: Home              Anticipated d/c is to: Home              Anticipated d/c date is: 2 days              Patient currently is not medically stable to d/c.    Consultants:    Cardiology  Procedures:   Echo  Antimicrobials:   Ceftriaxone 10/16 >10/19  Azithromycin 10/16 >  Augmentin 10/19>   Subjective: Feels that shortness of breath improving. No cough. No chest pain. Feeling better  Objective: Vitals:   09/19/20 0400 09/19/20 0600 09/19/20 0711 09/19/20 1118  BP: 106/70     Pulse: 98 100 99 (!) 102  Resp: (!) 22 (!) 23 (!) 22 (!) 31  Temp: 98.6 F (37 C)  98.1 F (36.7 C) 97.8 F (36.6 C)  TempSrc: Oral  Oral Oral  SpO2: 92% 96% 94% 98%  Weight:      Height:        Intake/Output Summary (Last 24 hours) at 09/19/2020 1156 Last data filed at 09/19/2020 0500 Gross per 24 hour  Intake 1405.2 ml  Output 5800 ml  Net -4394.8 ml   Filed Weights   09/16/20 0216 09/16/20 1000 09/18/20 0406  Weight: 105 kg 107.3 kg 108.8 kg    Examination:  General exam: Alert, awake, oriented x 3 Respiratory system: trace wheeze bilaterally. Respiratory effort normal. Cardiovascular system:RRR. No murmurs, rubs, gallops. Gastrointestinal system: Abdomen  is nondistended, soft and nontender. No organomegaly or masses felt. Normal bowel sounds heard. Central nervous system: Alert and oriented. No focal neurological deficits. Extremities: No C/C/E, +pedal pulses Skin: No rashes, lesions or ulcers Psychiatry: Judgement and insight appear normal. Mood & affect appropriate.    Data Reviewed: I have personally reviewed following labs and imaging studies  CBC: Recent Labs  Lab 09/16/20 0203 09/17/20 0600 09/17/20 1014 09/18/20 0528 09/19/20 0420  WBC 11.3* 11.4* 9.9 8.2 8.4  NEUTROABS 4.8 8.6*  --   --   --   HGB 15.0 12.5 10.7* 12.0 10.6*  HCT 48.2* 40.5 34.3* 39.2 35.6*  MCV 88.8 88.6 89.3 88.1 90.6  PLT 291 145* 135* 156 127*   Basic Metabolic Panel: Recent Labs  Lab 09/16/20 1429 09/16/20 1900 09/17/20 0600 09/18/20 0528 09/19/20 0420  NA 138 138 139 139 140  K 3.3* 2.9* 4.1 3.5 3.5  CL 109 107 106 105 110  CO2 20* 20* 20* 20*  21*  GLUCOSE 207* 145* 203* 171* 97  BUN 20 18 17 12 13   CREATININE 1.08* 0.95 0.89 0.78 0.73  CALCIUM 7.9* 8.2* 8.2* 8.5* 8.1*  MG  --   --  1.8  --   --    GFR: Estimated Creatinine Clearance: 110.5 mL/min (by C-G formula based on SCr of 0.73 mg/dL). Liver Function Tests: Recent Labs  Lab 09/16/20 0203 09/17/20 0600 09/18/20 0528 09/19/20 0420  AST 73* 925* 201* 62*  ALT 65* 756* 508* 275*  ALKPHOS 107 98 111 131*  BILITOT 0.9 1.1 1.2 0.8  PROT 7.6 6.1* 6.7 6.1*  ALBUMIN 3.6 2.9* 2.9* 2.6*   No results for input(s): LIPASE, AMYLASE in the last 168 hours. No results for input(s): AMMONIA in the last 168 hours. Coagulation Profile: Recent Labs  Lab 09/17/20 0600  INR 1.1   Cardiac Enzymes: No results for input(s): CKTOTAL, CKMB, CKMBINDEX, TROPONINI in the last 168 hours. BNP (last 3 results) No results for input(s): PROBNP in the last 8760 hours. HbA1C: No results for input(s): HGBA1C in the last 72 hours. CBG: Recent Labs  Lab 09/18/20 1100 09/18/20 1652 09/18/20 2054 09/19/20 0710 09/19/20 1116  GLUCAP 140* 119* 168* 81 116*   Lipid Profile: No results for input(s): CHOL, HDL, LDLCALC, TRIG, CHOLHDL, LDLDIRECT in the last 72 hours. Thyroid Function Tests: No results for input(s): TSH, T4TOTAL, FREET4, T3FREE, THYROIDAB in the last 72 hours. Anemia Panel: No results for input(s): VITAMINB12, FOLATE, FERRITIN, TIBC, IRON, RETICCTPCT in the last 72 hours. Sepsis Labs: Recent Labs  Lab 09/16/20 0203 09/16/20 0251 09/16/20 0507 09/16/20 0742 09/17/20 0600 09/17/20 1634  PROCALCITON 12.79  --   --   --  44.09  --   LATICACIDVEN  --  4.7* 4.6* 2.8*  --  1.8    Recent Results (from the past 240 hour(s))  Respiratory Panel by RT PCR (Flu A&B, Covid) - Nasopharyngeal Swab     Status: None   Collection Time: 09/16/20  2:12 AM   Specimen: Nasopharyngeal Swab  Result Value Ref Range Status   SARS Coronavirus 2 by RT PCR NEGATIVE NEGATIVE Final    Comment:  (NOTE) SARS-CoV-2 target nucleic acids are NOT DETECTED.  The SARS-CoV-2 RNA is generally detectable in upper respiratoy specimens during the acute phase of infection. The lowest concentration of SARS-CoV-2 viral copies this assay can detect is 131 copies/mL. A negative result does not preclude SARS-Cov-2 infection and should not be used as the sole basis for treatment  or other patient management decisions. A negative result may occur with  improper specimen collection/handling, submission of specimen other than nasopharyngeal swab, presence of viral mutation(s) within the areas targeted by this assay, and inadequate number of viral copies (<131 copies/mL). A negative result must be combined with clinical observations, patient history, and epidemiological information. The expected result is Negative.  Fact Sheet for Patients:  PinkCheek.be  Fact Sheet for Healthcare Providers:  GravelBags.it  This test is no t yet approved or cleared by the [place] FDA and  has been authorized for detection and/or diagnosis of SARS-CoV-2 by FDA under an Emergency Use Authorization (EUA). This EUA will remain  in effect (meaning this test can be used) for the duration of the COVID-19 declaration under Section 564(b)(1) of the Act, 21 U.S.C. section 360bbb-3(b)(1), unless the authorization is terminated or revoked sooner.     Influenza A by PCR NEGATIVE NEGATIVE Final   Influenza B by PCR NEGATIVE NEGATIVE Final    Comment: (NOTE) The Xpert Xpress SARS-CoV-2/FLU/RSV assay is intended as an aid in  the diagnosis of influenza from Nasopharyngeal swab specimens and  should not be used as a sole basis for treatment. Nasal washings and  aspirates are unacceptable for Xpert Xpress SARS-CoV-2/FLU/RSV  testing.  Fact Sheet for Patients: PinkCheek.be  Fact Sheet for Healthcare  Providers: GravelBags.it  This test is not yet approved or cleared by the [place] FDA and  has been authorized for detection and/or diagnosis of SARS-CoV-2 by  FDA under an Emergency Use Authorization (EUA). This EUA will remain  in effect (meaning this test can be used) for the duration of the  Covid-19 declaration under Section 564(b)(1) of the Act, 21  U.S.C. section 360bbb-3(b)(1), unless the authorization is  terminated or revoked. Performed at [facility], [address] [place], [place] [address]   Urine culture     Status: Abnormal   Collection Time: 09/16/20  2:35 AM   Specimen: Urine, Catheterized  Result Value Ref Range Status   Specimen Description   Final    URINE, CATHETERIZED Performed at [facility], [address], [place], [place] [address]    Special Requests   Final    NONE Performed at [facility], [address], [place], [place] [address]    Culture >=100,000 COLONIES/mL ESCHERICHIA COLI (A)  Final   Report Status 09/18/2020 FINAL  Final   Organism ID, Bacteria ESCHERICHIA COLI (A)  Final      Susceptibility   Escherichia coli - MIC*    AMPICILLIN 4 SENSITIVE Sensitive     CEFAZOLIN <=4 SENSITIVE Sensitive     CEFTRIAXONE <=0.25 SENSITIVE Sensitive     CIPROFLOXACIN <=0.25 SENSITIVE Sensitive     GENTAMICIN <=1 SENSITIVE Sensitive     IMIPENEM <=0.25 SENSITIVE Sensitive     NITROFURANTOIN <=16 SENSITIVE Sensitive     TRIMETH/SULFA <=20 SENSITIVE Sensitive     AMPICILLIN/SULBACTAM <=2 SENSITIVE Sensitive     PIP/TAZO <=4 SENSITIVE Sensitive     * >=100,000 COLONIES/mL ESCHERICHIA COLI  Blood Culture (routine x 2)     Status: Abnormal (Preliminary result)   Collection Time: 09/16/20  2:52 AM   Specimen: Left Antecubital; Blood  Result Value Ref Range Status   Specimen Description   Final    LEFT ANTECUBITAL Performed at [facility], [address] [place],  [address]    Special Requests   Final    BOTTLES  DRAWN AEROBIC AND ANAEROBIC Blood Culture adequate volume Performed at  [place] [place], [place] [address]    Culture  Setup Time   Final    GRAM NEGATIVE RODS CRITICAL RESULT CALLED TO, READ BACK BY AND VERIFIED WITH: RN C ALSTON [id number] AT 859 AM BY CM Performed at [facility], [place] [address], [place],  [address]    Culture ESCHERICHIA COLI (A)  Final   Report Status PENDING  Incomplete   Organism ID, Bacteria ESCHERICHIA COLI  Final      Susceptibility   Escherichia coli - MIC*    AMPICILLIN 4 SENSITIVE Sensitive     CEFAZOLIN <=4 SENSITIVE Sensitive     CEFEPIME <=0.12 SENSITIVE Sensitive     CEFTAZIDIME <=1 SENSITIVE Sensitive     CEFTRIAXONE <=0.25 SENSITIVE Sensitive     CIPROFLOXACIN <=0.25 SENSITIVE Sensitive     GENTAMICIN <=1 SENSITIVE Sensitive     IMIPENEM <=0.25 SENSITIVE Sensitive     TRIMETH/SULFA <=20 SENSITIVE Sensitive     AMPICILLIN/SULBACTAM <=2 SENSITIVE Sensitive     PIP/TAZO <=4 SENSITIVE Sensitive     * ESCHERICHIA COLI  Blood Culture ID Panel (Reflexed)     Status: Abnormal   Collection Time: 09/16/20  2:52 AM  Result Value Ref Range Status   Enterococcus faecalis NOT DETECTED NOT DETECTED Final   Enterococcus Faecium NOT DETECTED NOT DETECTED Final   Listeria monocytogenes NOT DETECTED NOT DETECTED Final   Staphylococcus species NOT DETECTED NOT DETECTED Final   Staphylococcus aureus (BCID) NOT DETECTED NOT DETECTED Final   Staphylococcus epidermidis NOT DETECTED NOT DETECTED Final   Staphylococcus lugdunensis NOT DETECTED NOT DETECTED Final   Streptococcus species NOT DETECTED NOT DETECTED Final   Streptococcus agalactiae NOT DETECTED NOT DETECTED Final   Streptococcus pneumoniae NOT DETECTED NOT DETECTED Final   Streptococcus pyogenes NOT DETECTED NOT DETECTED Final   A.calcoaceticus-baumannii NOT DETECTED NOT DETECTED Final   Bacteroides fragilis NOT DETECTED NOT DETECTED Final   Enterobacterales DETECTED (A) NOT  DETECTED Final    Comment: Enterobacterales represent a large order of gram negative bacteria, not a single organism. CRITICAL RESULT CALLED TO, READ BACK BY AND VERIFIED WITH: RN C ALSTON [id number] AT 900 AM BY CM    Enterobacter cloacae complex NOT DETECTED NOT DETECTED Final   Escherichia coli DETECTED (A) NOT DETECTED Final    Comment: CRITICAL RESULT CALLED TO, READ BACK BY AND VERIFIED WITH: RN C ALSTON [id number] AT 900 AM BY CM    Klebsiella aerogenes NOT DETECTED NOT DETECTED Final   Klebsiella oxytoca NOT DETECTED NOT DETECTED Final   Klebsiella pneumoniae NOT DETECTED NOT DETECTED Final   Proteus species NOT DETECTED NOT DETECTED Final   Salmonella species NOT DETECTED NOT DETECTED Final   Serratia marcescens NOT DETECTED NOT DETECTED Final   Haemophilus influenzae NOT DETECTED NOT DETECTED Final   Neisseria meningitidis NOT DETECTED NOT DETECTED Final   Pseudomonas aeruginosa NOT DETECTED NOT DETECTED Final   Stenotrophomonas maltophilia NOT DETECTED NOT DETECTED Final   Candida albicans NOT DETECTED NOT DETECTED Final   Candida auris NOT DETECTED NOT DETECTED Final   Candida glabrata NOT DETECTED NOT DETECTED Final   Candida krusei NOT DETECTED NOT DETECTED Final   Candida parapsilosis NOT DETECTED NOT DETECTED Final   Candida tropicalis NOT DETECTED NOT DETECTED Final   Cryptococcus neoformans/gattii NOT DETECTED NOT DETECTED Final   CTX-M ESBL NOT DETECTED NOT DETECTED Final   Carbapenem resistance IMP NOT DETECTED NOT DETECTED Final   Carbapenem resistance KPC NOT DETECTED NOT  DETECTED Final   Carbapenem resistance NDM NOT DETECTED NOT DETECTED Final   Carbapenem resist OXA 48 LIKE NOT DETECTED NOT DETECTED Final   Carbapenem resistance VIM NOT DETECTED NOT DETECTED Final    Comment: Performed at [facility], [place] [address] [place], [place] [address]  Blood Culture (routine x 2)     Status: None (Preliminary result)   Collection Time: 09/16/20  3:09 AM   Specimen:  BLOOD LEFT HAND  Result Value Ref Range Status   Specimen Description BLOOD LEFT HAND  Final   Special Requests   Final    BOTTLES DRAWN AEROBIC AND ANAEROBIC Blood Culture adequate volume   Culture   Final    NO GROWTH 3 DAYS Performed at [facility], [address], [place], [place] [address]    Report Status PENDING  Incomplete  Respiratory Panel by RT PCR (Flu A&B, Covid) - Nasopharyngeal Swab     Status: None   Collection Time: 09/16/20  6:03 AM   Specimen: Nasopharyngeal Swab  Result Value Ref Range Status   SARS Coronavirus 2 by RT PCR NEGATIVE NEGATIVE Final    Comment: (NOTE) SARS-CoV-2 target nucleic acids are NOT DETECTED.  The SARS-CoV-2 RNA is generally detectable in upper respiratoy specimens during the acute phase of infection. The lowest concentration of SARS-CoV-2 viral copies this assay can detect is 131 copies/mL. A negative result does not preclude SARS-Cov-2 infection and should not be used as the sole basis for treatment or other patient management decisions. A negative result may occur with  improper specimen collection/handling, submission of specimen other than nasopharyngeal swab, presence of viral mutation(s) within the areas targeted by this assay, and inadequate number of viral copies (<131 copies/mL). A negative result must be combined with clinical observations, patient history, and epidemiological information. The expected result is Negative.  Fact Sheet for Patients:  PinkCheek.be  Fact Sheet for Healthcare Providers:  GravelBags.it  This test is no t yet approved or cleared by the [place] FDA and  has been authorized for detection and/or diagnosis of SARS-CoV-2 by FDA under an Emergency Use Authorization (EUA). This EUA will remain  in effect (meaning this test can be used) for the duration of the COVID-19 declaration under Section 564(b)(1) of the Act, 21 U.S.C. section  360bbb-3(b)(1), unless the authorization is terminated or revoked sooner.     Influenza A by PCR NEGATIVE NEGATIVE Final   Influenza B by PCR NEGATIVE NEGATIVE Final    Comment: (NOTE) The Xpert Xpress SARS-CoV-2/FLU/RSV assay is intended as an aid in  the diagnosis of influenza from Nasopharyngeal swab specimens and  should not be used as a sole basis for treatment. Nasal washings and  aspirates are unacceptable for Xpert Xpress SARS-CoV-2/FLU/RSV  testing.  Fact Sheet for Patients: PinkCheek.be  Fact Sheet for Healthcare Providers: GravelBags.it  This test is not yet approved or cleared by the [place] FDA and  has been authorized for detection and/or diagnosis of SARS-CoV-2 by  FDA under an Emergency Use Authorization (EUA). This EUA will remain  in effect (meaning this test can be used) for the duration of the  Covid-19 declaration under Section 564(b)(1) of the Act, 21  U.S.C. section 360bbb-3(b)(1), unless the authorization is  terminated or revoked. Performed at [facility], [address] [place], [place] [address]   MRSA PCR Screening     Status: None   Collection Time: 09/16/20  9:45 AM   Specimen: Nasal Mucosa; Nasopharyngeal  Result Value  Ref Range Status   MRSA by PCR NEGATIVE NEGATIVE Final    Comment:        The GeneXpert MRSA Assay (FDA approved for NASAL specimens only), is one component of a comprehensive MRSA colonization surveillance program. It is not intended to diagnose MRSA infection nor to guide or monitor treatment for MRSA infections. Performed at [facility], [address], [place], [place] [address]   C Difficile Quick Screen w PCR reflex     Status: None   Collection Time: 09/17/20  3:54 PM   Specimen: STOOL  Result Value Ref Range Status   C Diff antigen NEGATIVE NEGATIVE Final   C Diff toxin NEGATIVE NEGATIVE Final   C Diff interpretation No C. difficile detected.  Final     Comment: Performed at [facility], [address] [place], [place] [address]         Radiology Studies: No results found.      Scheduled Meds: . albuterol  2.5 mg Nebulization TID  . aspirin EC  81 mg Oral Daily  . busPIRone  15 mg Oral BID  . Chlorhexidine Gluconate Cloth  6 each Topical Daily  . insulin aspart  0-20 Units Subcutaneous TID WC  . insulin aspart  0-5 Units Subcutaneous QHS  . insulin glargine  70 Units Subcutaneous Q24H  . losartan  12.5 mg Oral Daily  . metoprolol succinate  12.5 mg Oral Daily  . mirabegron ER  50 mg Oral Daily  . pantoprazole  40 mg Oral Daily  . PARoxetine  60 mg Oral Daily  . potassium chloride  40 mEq Oral Once  . pregabalin  300 mg Oral Q12H  . QUEtiapine  100 mg Oral QHS  . sodium chloride flush  3 mL Intravenous Q12H   Continuous Infusions: . azithromycin Stopped (09/19/20 0453)  . cefTRIAXone (ROCEPHIN)  IV 2 g (09/19/20 0546)  . heparin 2,500 Units/hr (09/19/20 0548)  . insulin Stopped (09/16/20 2000)     LOS: 3 days    Time spent: 26mns    JKathie Dike MD [facility]   If 7PM-7AM, please contact night-coverage [email or web address]  09/19/2020, 11:56 AM

## 2020-09-19 NOTE — Progress Notes (Signed)
Inpatient Diabetes Program Recommendations  AACE/ADA: New Consensus Statement on Inpatient Glycemic Control (2015)  Target Ranges:  Prepandial:   less than 140 mg/dL      Peak postprandial:   less than 180 mg/dL (1-2 hours)      Critically ill patients:  140 - 180 mg/dL   Lab Results  Component Value Date   GLUCAP 81 09/19/2020   HGBA1C 11.1 (H) 09/16/2020    Review of Glycemic Control  Diabetes history: DM 2 Outpatient Diabetes medications: Tresiba 72 units Daily, Humalog SSI before each meal, (ordered 20 units tid), Ozempic 0.5 mg weekly, Invokamet 150-500 mg bid Current orders for Inpatient glycemic control:  Levemir 70 units, Novolog 0-20 units tid + hs  Inpatient Diabetes Program Recommendations:    Glucose trends at goal on home regimen with controlled diet in the hospital.    Spoke with pt over the phone regarding her A1c of 11.1% and glucose control at home. Her PCP manages her diabetes and has declined to give her a referral for Endocrinologist. Pt desires to see D. [name], Endocrinology.   From chart review she has seen Dr. Dorris Fetch in the past but was let go, however per Dr. Liliane Channel note he will give her another chance.  Pt has not been doing as well with her diet lately, she did get her Last A1c down to a 9%. Pt has all needed supplies and resources with insurance.  Discussed glucose and A1c goals. Pt voices understanding. Discussed importance of glucose control.   Thanks,  Tama Headings RN, MSN, BC-ADM Inpatient Diabetes Coordinator Team Pager [phone] (8a-5p)
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
## 2020-09-19 NOTE — Progress Notes (Signed)
Progress Note  Patient Name: Yolanda [name] Date of Encounter: 09/19/2020  [facility] Cardiologist: New  Subjective   No complaints  Inpatient Medications    Scheduled Meds: . albuterol  2.5 mg Nebulization TID  . aspirin EC  81 mg Oral Daily  . busPIRone  15 mg Oral BID  . Chlorhexidine Gluconate Cloth  6 each Topical Daily  . influenza vac split quadrivalent PF  0.5 mL Intramuscular Tomorrow-1000  . insulin aspart  0-20 Units Subcutaneous TID WC  . insulin aspart  0-5 Units Subcutaneous QHS  . insulin glargine  70 Units Subcutaneous Q24H  . metoprolol succinate  12.5 mg Oral Daily  . mirabegron ER  50 mg Oral Daily  . pantoprazole  40 mg Oral Daily  . PARoxetine  60 mg Oral Daily  . pneumococcal 23 valent vaccine  0.5 mL Intramuscular Tomorrow-1000  . pregabalin  300 mg Oral Q12H  . QUEtiapine  100 mg Oral QHS   Continuous Infusions: . azithromycin Stopped (09/19/20 0453)  . cefTRIAXone (ROCEPHIN)  IV 2 g (09/19/20 0546)  . heparin 2,500 Units/hr (09/19/20 0548)  . insulin Stopped (09/16/20 2000)   PRN Meds: acetaminophen **OR** acetaminophen, albuterol, clonazePAM, dextrose, ondansetron **OR** ondansetron (ZOFRAN) IV, oxyCODONE, polyethylene glycol   Vital Signs    Vitals:   09/19/20 0000 09/19/20 0400 09/19/20 0600 09/19/20 0711  BP: 116/81 106/70    Pulse: (!) 103 98 100 99  Resp: (!) 22 (!) 22 (!) 23 (!) 22  Temp: 98.7 F (37.1 C) 98.6 F (37 C)  98.1 F (36.7 C)  TempSrc:  Oral  Oral  SpO2: (!) 89% 92% 96% 94%  Weight:      Height:        Intake/Output Summary (Last 24 hours) at 09/19/2020 0908 Last data filed at 09/19/2020 0500 Gross per 24 hour  Intake 1405.2 ml  Output 5800 ml  Net -4394.8 ml   Last 3 Weights 09/18/2020 09/16/2020 09/16/2020  Weight (lbs) 239 lb 13.8 oz 236 lb 8.9 oz 231 lb 7.7 oz  Weight (kg) 108.8 kg 107.3 kg 105 kg      Telemetry    Mild sinus tach - Personally Reviewed  ECG    n/a - Personally  Reviewed  Physical Exam   GEN: No acute distress.   Neck: No JVD Cardiac: RRR, no murmurs, rubs, or gallops.  Respiratory: Clear to auscultation bilaterally. GI: Soft, nontender, non-distended  MS: No edema; No deformity. Neuro:  Nonfocal  Psych: Normal affect   Labs    High Sensitivity Troponin:   Recent Labs  Lab 09/16/20 1900 09/16/20 2059 09/17/20 1634 09/17/20 2247 09/18/20 0528  TROPONINIHS 2,445* 2,886* 1,865* 1,429* 1,416*      Chemistry Recent Labs  Lab 09/17/20 0600 09/18/20 0528 09/19/20 0420  NA 139 139 140  K 4.1 3.5 3.5  CL 106 105 110  CO2 20* 20* 21*  GLUCOSE 203* 171* 97  BUN 17 12 13   CREATININE 0.89 0.78 0.73  CALCIUM 8.2* 8.5* 8.1*  PROT 6.1* 6.7 6.1*  ALBUMIN 2.9* 2.9* 2.6*  AST 925* 201* 62*  ALT 756* 508* 275*  ALKPHOS 98 111 131*  BILITOT 1.1 1.2 0.8  GFRNONAA >60 >60 >60  ANIONGAP 13 14 9      Hematology Recent Labs  Lab 09/17/20 1014 09/18/20 0528 09/19/20 0420  WBC 9.9 8.2 8.4  RBC 3.84* 4.45 3.93  HGB 10.7* 12.0 10.6*  HCT 34.3* 39.2 35.6*  MCV 89.3 88.1 90.6  MCH 27.9 27.0 27.0  MCHC 31.2 30.6 29.8*  RDW 15.9* 15.5 15.6*  PLT 135* 156 133*    BNP Recent Labs  Lab 09/16/20 0203 09/19/20 0420  BNP 110.0* 345.0*     DDimer  Recent Labs  Lab 09/16/20 0203  DDIMER 9.53*     Radiology    No results found.  Cardiac Studies     Patient Profile     Yolanda [name] is a 49 y.o. female with a hx of DM2 who is being seen today for the evaluation of elevated troponin in setting of sepsis at the request of Dr Roderic Palau.  Assessment & Plan    1. Severe sepsis - pneumonia vs UTI, blood cultures 1/2 + ecoli - covid neg x 2 - abx per primary team - fevers have resolved, lactic acidosis resolved. Now off O2 with sats mid 90s  2. Elevated troponin - in setting of severe sepsis, lactic acidosis, DKA, tachycardia, hypoxia - fairly significant trop elevation peak 2900, EKG without specific ischemic changes -  echo with newly diagnosed LVEF 20-25%, apex akinetic  - differental would include acute obstructive CAD/ACS, severe demand in the setting of chronic obstructive cornary disease, or stress induced CM - medical therapy with ASA, toprol 12.5. No statin as LFTs remain elevated likely from sepsis - will need RHC/LHC when systemic illness further improves.   3. Acute systolic heart failure - - echo with newly diagnosed LVEF 20-25%, apex akinetic - started toprol 12.59m daily yesterday. Soft bp's at times, start losartan 12.514mdaily, if tolerates consider entresto over time.  - will need RHC/LHC when systemic illness further improves.  - appears euvolemic currently, no indicaiton for diuresis.   4. DKA - per primary team, now on [place] insulin   I think has progressed enough to plan for cath tomorrow, will [facility] with primary team [facility]  I have reviewed the risks, indications, and alternatives to cardiac catheterization, possible angioplasty, and stenting with the patient and her sister today. Risks include but are not limited to bleeding, infection, vascular injury, stroke, myocardial infection, arrhythmia, kidney injury, radiation-related injury in the case of prolonged fluoroscopy use, emergency cardiac surgery, and death. The patient understands the risks of serious complication is 1-2 in [id number] diagnostic cardiac cath and 1-2% or less with angioplasty/stenting.    For questions or updates, please contact [facility] consult [email or web address] for contact info under        Signed, BrCarlyle DollyMD  09/19/2020, 9:08 AM
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
## 2020-09-20 ENCOUNTER — Encounter (HOSPITAL_COMMUNITY)
Admission: EM | Disposition: A | Discharge status: Home / Self Care | Payer: Self-pay | Source: Home / Self Care | Attending: Internal Medicine

## 2020-09-20 ENCOUNTER — Encounter (HOSPITAL_COMMUNITY): Payer: Self-pay | Admitting: Cardiovascular Disease

## 2020-09-20 ENCOUNTER — Ambulatory Visit (HOSPITAL_COMMUNITY): Admission: RE | Admit: 2020-09-20 | Payer: Medicaid Other | Source: Home / Self Care | Admitting: Cardiovascular Disease

## 2020-09-20 DIAGNOSIS — I429 Cardiomyopathy, unspecified: Secondary | ICD-10-CM

## 2020-09-20 DIAGNOSIS — I2510 Atherosclerotic heart disease of native coronary artery without angina pectoris: Secondary | ICD-10-CM | POA: Diagnosis not present

## 2020-09-20 DIAGNOSIS — I5181 Takotsubo syndrome: Secondary | ICD-10-CM

## 2020-09-20 DIAGNOSIS — I214 Non-ST elevation (NSTEMI) myocardial infarction: Secondary | ICD-10-CM | POA: Diagnosis not present

## 2020-09-20 HISTORY — PX: RIGHT/LEFT HEART CATH AND CORONARY ANGIOGRAPHY: CATH118266

## 2020-09-20 LAB — POCT I-STAT 7, (LYTES, BLD GAS, ICA,H+H)
Acid-base deficit: 3 mmol/L — ABNORMAL HIGH (ref 0.0–2.0)
Bicarbonate: 22.7 mmol/L (ref 20.0–28.0)
Calcium, Ion: 1.21 mmol/L (ref 1.15–1.40)
HCT: 31 % — ABNORMAL LOW (ref 36.0–46.0)
Hemoglobin: 10.5 g/dL — ABNORMAL LOW (ref 12.0–15.0)
O2 Saturation: 99 %
Potassium: 3.9 mmol/L (ref 3.5–5.1)
Sodium: 141 mmol/L (ref 135–145)
TCO2: 24 mmol/L (ref 22–32)
pCO2 arterial: 41.5 mmHg (ref 32.0–48.0)
pH, Arterial: 7.347 — ABNORMAL LOW (ref 7.350–7.450)
pO2, Arterial: 136 mmHg — ABNORMAL HIGH (ref 83.0–108.0)

## 2020-09-20 LAB — COMPREHENSIVE METABOLIC PANEL
ALT: 192 U/L — ABNORMAL HIGH (ref 0–44)
AST: 38 U/L (ref 15–41)
Albumin: 2.7 g/dL — ABNORMAL LOW (ref 3.5–5.0)
Alkaline Phosphatase: 161 U/L — ABNORMAL HIGH (ref 38–126)
Anion gap: 12 (ref 5–15)
BUN: 13 mg/dL (ref 6–20)
CO2: 21 mmol/L — ABNORMAL LOW (ref 22–32)
Calcium: 8.6 mg/dL — ABNORMAL LOW (ref 8.9–10.3)
Chloride: 107 mmol/L (ref 98–111)
Creatinine, Ser: 0.63 mg/dL (ref 0.44–1.00)
GFR, Estimated: >60 mL/min (ref >60)
Glucose, Bld: 118 mg/dL — ABNORMAL HIGH (ref 70–99)
Potassium: 3.7 mmol/L (ref 3.5–5.1)
Sodium: 140 mmol/L (ref 135–145)
Total Bilirubin: 0.8 mg/dL (ref 0.3–1.2)
Total Protein: 6.4 g/dL — ABNORMAL LOW (ref 6.5–8.1)

## 2020-09-20 LAB — CULTURE, BLOOD (ROUTINE X 2): Special Requests: ADEQUATE

## 2020-09-20 LAB — POCT I-STAT EG7
Acid-base deficit: 2 mmol/L (ref 0.0–2.0)
Bicarbonate: 23.7 mmol/L (ref 20.0–28.0)
Calcium, Ion: 1.21 mmol/L (ref 1.15–1.40)
HCT: 31 % — ABNORMAL LOW (ref 36.0–46.0)
Hemoglobin: 10.5 g/dL — ABNORMAL LOW (ref 12.0–15.0)
O2 Saturation: 65 %
Potassium: 3.9 mmol/L (ref 3.5–5.1)
Sodium: 141 mmol/L (ref 135–145)
TCO2: 25 mmol/L (ref 22–32)
pCO2, Ven: 44.3 mmHg (ref 44.0–60.0)
pH, Ven: 7.335 (ref 7.250–7.430)
pO2, Ven: 36 mmHg (ref 32.0–45.0)

## 2020-09-20 LAB — GLUCOSE, CAPILLARY
Glucose-Capillary: 114 mg/dL — ABNORMAL HIGH (ref 70–99)
Glucose-Capillary: 108 mg/dL — ABNORMAL HIGH (ref 70–99)
Glucose-Capillary: 102 mg/dL — ABNORMAL HIGH (ref 70–99)
Glucose-Capillary: 120 mg/dL — ABNORMAL HIGH (ref 70–99)

## 2020-09-20 LAB — CBC
HCT: 35.1 % — ABNORMAL LOW (ref 36.0–46.0)
HCT: 34.5 % — ABNORMAL LOW (ref 36.0–46.0)
Hemoglobin: 11 g/dL — ABNORMAL LOW (ref 12.0–15.0)
Hemoglobin: 10.8 g/dL — ABNORMAL LOW (ref 12.0–15.0)
MCH: 27.3 pg (ref 26.0–34.0)
MCH: 26.9 pg (ref 26.0–34.0)
MCHC: 31.3 g/dL (ref 30.0–36.0)
MCHC: 31.3 g/dL (ref 30.0–36.0)
MCV: 87.1 fL (ref 80.0–100.0)
MCV: 86 fL (ref 80.0–100.0)
Platelets: 162 10*3/uL (ref 150–400)
Platelets: 184 10*3/uL (ref 150–400)
RBC: 4.03 MIL/uL (ref 3.87–5.11)
RBC: 4.01 MIL/uL (ref 3.87–5.11)
RDW: 15.6 % — ABNORMAL HIGH (ref 11.5–15.5)
RDW: 15.6 % — ABNORMAL HIGH (ref 11.5–15.5)
WBC: 7.4 10*3/uL (ref 4.0–10.5)
WBC: 7.5 10*3/uL (ref 4.0–10.5)
nRBC: 0.7 % — ABNORMAL HIGH (ref 0.0–0.2)
nRBC: 0.7 % — ABNORMAL HIGH (ref 0.0–0.2)

## 2020-09-20 LAB — HEPARIN LEVEL (UNFRACTIONATED): Heparin Unfractionated: 0.49 IU/mL (ref 0.30–0.70)

## 2020-09-20 LAB — CREATININE, SERUM
Creatinine, Ser: 0.66 mg/dL (ref 0.44–1.00)
GFR, Estimated: >60 mL/min (ref >60)

## 2020-09-20 SURGERY — RIGHT/LEFT HEART CATH AND CORONARY ANGIOGRAPHY
Anesthesia: LOCAL

## 2020-09-20 MED ORDER — LIDOCAINE HCL (PF) 1 % IJ SOLN
INTRAMUSCULAR | Status: DC | PRN
  Administered 2020-09-20 (×2): 2 mL

## 2020-09-20 MED ORDER — SODIUM CHLORIDE 0.9% FLUSH
3.0000 mL | INTRAVENOUS | Status: DC | PRN
  Administered 2020-09-22: 3 mL via INTRAVENOUS

## 2020-09-20 MED ORDER — ATORVASTATIN CALCIUM 40 MG PO TABS
40.0000 mg | ORAL_TABLET | Freq: Every day | ORAL | Status: DC
  Administered 2020-09-20 – 2020-09-21 (×2): 40 mg via ORAL
  Filled 2020-09-20 (×2): qty 1

## 2020-09-20 MED ORDER — SODIUM CHLORIDE 0.9 % IV SOLN
INTRAVENOUS | Status: AC | PRN
  Administered 2020-09-20: 1 mL/kg/h via INTRAVENOUS

## 2020-09-20 MED ORDER — ASPIRIN 81 MG PO CHEW: 81.0000 mg | CHEWABLE_TABLET | ORAL | Status: DC

## 2020-09-20 MED ORDER — SODIUM CHLORIDE 0.9 % IV SOLN: 250.0000 mL | INTRAVENOUS | Status: DC | PRN

## 2020-09-20 MED ORDER — HEPARIN (PORCINE) IN NACL 1000-0.9 UT/500ML-% IV SOLN
INTRAVENOUS | Status: DC | PRN
  Administered 2020-09-20 (×2): 500 mL

## 2020-09-20 MED ORDER — MIDAZOLAM HCL 2 MG/2ML IJ SOLN
INTRAMUSCULAR | Status: DC | PRN
  Administered 2020-09-20 (×2): 1 mg via INTRAVENOUS

## 2020-09-20 MED ORDER — SODIUM CHLORIDE 0.9% FLUSH
3.0000 mL | Freq: Two times a day (BID) | INTRAVENOUS | Status: DC
  Administered 2020-09-20 – 2020-09-21 (×3): 3 mL via INTRAVENOUS

## 2020-09-20 MED ORDER — FENTANYL CITRATE (PF) 100 MCG/2ML IJ SOLN
INTRAMUSCULAR | Status: DC | PRN
  Administered 2020-09-20 (×2): 25 ug via INTRAVENOUS

## 2020-09-20 MED ORDER — ASPIRIN 81 MG PO CHEW
81.0000 mg | CHEWABLE_TABLET | ORAL | Status: AC
  Administered 2020-09-20: 81 mg via ORAL
  Filled 2020-09-20: qty 1

## 2020-09-20 MED ORDER — ENOXAPARIN SODIUM 40 MG/0.4ML ~~LOC~~ SOLN
40.0000 mg | SUBCUTANEOUS | Status: DC
  Administered 2020-09-21 – 2020-09-22 (×2): 40 mg via SUBCUTANEOUS
  Filled 2020-09-20 (×2): qty 0.4

## 2020-09-20 MED ORDER — VERAPAMIL HCL 2.5 MG/ML IV SOLN
INTRAVENOUS | Status: DC | PRN
  Administered 2020-09-20: 10 mL via INTRA_ARTERIAL

## 2020-09-20 MED ORDER — IOHEXOL 350 MG/ML SOLN
INTRAVENOUS | Status: DC | PRN
  Administered 2020-09-20: 45 mL via INTRA_ARTERIAL

## 2020-09-20 MED ORDER — HYDRALAZINE HCL 20 MG/ML IJ SOLN: 10.0000 mg | INTRAMUSCULAR | Status: AC | PRN

## 2020-09-20 MED ORDER — SODIUM CHLORIDE 0.9 % IV SOLN: INTRAVENOUS | Status: DC

## 2020-09-20 MED ORDER — HEPARIN SODIUM (PORCINE) 1000 UNIT/ML IJ SOLN
INTRAMUSCULAR | Status: DC | PRN
  Administered 2020-09-20: 5000 [IU] via INTRAVENOUS

## 2020-09-20 MED ORDER — LABETALOL HCL 5 MG/ML IV SOLN: 10.0000 mg | INTRAVENOUS | Status: AC | PRN

## 2020-09-20 SURGICAL SUPPLY — 11 items
CATH 5FR JL3.5 JR4 ANG PIG MP (CATHETERS) ×1 IMPLANT
CATH SWAN GANZ 7F STRAIGHT (CATHETERS) ×1 IMPLANT
DEVICE RAD COMP TR BAND LRG (VASCULAR PRODUCTS) ×1 IMPLANT
GLIDESHEATH SLEND SS 6F .021 (SHEATH) ×1 IMPLANT
GLIDESHEATH SLENDER 7FR .021G (SHEATH) ×1 IMPLANT
GUIDEWIRE INQWIRE 1.5J.035X260 (WIRE) IMPLANT
INQWIRE 1.5J .035X260CM (WIRE) ×2
KIT HEART LEFT (KITS) ×2 IMPLANT
PACK CARDIAC CATHETERIZATION (CUSTOM PROCEDURE TRAY) ×2 IMPLANT
TRANSDUCER W/STOPCOCK (MISCELLANEOUS) ×2 IMPLANT
TUBING CIL FLEX 10 FLL-RA (TUBING) ×2 IMPLANT

## 2020-09-20 NOTE — Progress Notes (Addendum)
Progress Note  Patient Name: Yolanda [name] Date of Encounter: 09/20/2020  Primary Cardiologist: Carlyle Dolly, MD   Subjective   No chest pain overnight. Breathing improved. She has been NPO since midnight for planned cardiac catheterization later today.   Inpatient Medications    Scheduled Meds: . amoxicillin-clavulanate  1 tablet Oral Q12H  . aspirin  81 mg Oral Pre-Cath  . aspirin EC  81 mg Oral Daily  . atorvastatin  40 mg Oral QHS  . azithromycin  500 mg Oral Daily  . busPIRone  15 mg Oral BID  . Chlorhexidine Gluconate Cloth  6 each Topical Daily  . insulin aspart  0-20 Units Subcutaneous TID WC  . insulin aspart  0-5 Units Subcutaneous QHS  . insulin glargine  70 Units Subcutaneous Q24H  . losartan  12.5 mg Oral Daily  . metoprolol succinate  12.5 mg Oral Daily  . mirabegron ER  50 mg Oral Daily  . pantoprazole  40 mg Oral Daily  . PARoxetine  60 mg Oral Daily  . pregabalin  300 mg Oral Q12H  . QUEtiapine  100 mg Oral QHS  . sodium chloride flush  3 mL Intravenous Q12H   Continuous Infusions: . sodium chloride    . heparin 2,500 Units/hr (09/20/20 0436)  . insulin Stopped (09/16/20 2000)   PRN Meds: acetaminophen **OR** acetaminophen, albuterol, alum & mag hydroxide-simeth, clonazePAM, dextrose, ondansetron **OR** ondansetron (ZOFRAN) IV, oxyCODONE, polyethylene glycol   Vital Signs    Vitals:   09/19/20 1604 09/19/20 1941 09/19/20 2000 09/20/20 0800  BP:   135/87 127/85  Pulse: 99  (!) 112 100  Resp: 19  (!) 21 19  Temp: 98.2 F (36.8 C)     TempSrc: Oral     SpO2: 96% 96% 95% 96%  Weight:      Height:        Intake/Output Summary (Last 24 hours) at 09/20/2020 0857 Last data filed at 09/19/2020 1600 Gross per 24 hour  Intake --  Output 2200 ml  Net -2200 ml    Last 3 Weights 09/18/2020 09/16/2020 09/16/2020  Weight (lbs) 239 lb 13.8 oz 236 lb 8.9 oz 231 lb 7.7 oz  Weight (kg) 108.8 kg 107.3 kg 105 kg      Telemetry    Sinus  rhythm, HR in 90's to low-100's.  - Personally Reviewed  ECG    No new tracings.   Physical Exam   General: Well developed, female appearing in no acute distress. Head: Normocephalic, atraumatic.  Neck: Supple without bruits, JVD not elevated. Lungs:  Resp regular and unlabored, CTA without wheezing or rales. Heart: RRR, S1, S2, no S3, S4, or murmur; no rub. Abdomen: Soft, non-tender, non-distended. Extremities: No clubbing, cyanosis, or lower extremity edema. Distal pedal pulses are 2+ bilaterally. Neuro: Alert and oriented X 3. Moves all extremities spontaneously. Psych: Normal affect.  Labs    Chemistry Recent Labs  Lab 09/18/20 0528 09/19/20 0420 09/20/20 0313  NA 139 140 140  K 3.5 3.5 3.7  CL 105 110 107  CO2 20* 21* 21*  GLUCOSE 171* 97 118*  BUN 12 13 13   CREATININE 0.78 0.73 0.63  CALCIUM 8.5* 8.1* 8.6*  PROT 6.7 6.1* 6.4*  ALBUMIN 2.9* 2.6* 2.7*  AST 201* 62* 38  ALT 508* 275* 192*  ALKPHOS 111 131* 161*  BILITOT 1.2 0.8 0.8  GFRNONAA >60 >60 >60  ANIONGAP 14 9 12      Hematology Recent Labs  Lab 09/18/20 [phone]  09/19/20 0420 09/20/20 0313  WBC 8.2 8.4 7.4  RBC 4.45 3.93 4.03  HGB 12.0 10.6* 11.0*  HCT 39.2 35.6* 35.1*  MCV 88.1 90.6 87.1  MCH 27.0 27.0 27.3  MCHC 30.6 29.8* 31.3  RDW 15.5 15.6* 15.6*  PLT 156 133* 162    BNP Recent Labs  Lab 09/16/20 0203 09/19/20 0420  BNP 110.0* 345.0*     DDimer  Recent Labs  Lab 09/16/20 0203  DDIMER 9.53*     Radiology    No results found.  Cardiac Studies   Echocardiogram: 09/2020 IMPRESSIONS   1. There is severe hypokinesis of the mid-to-distal anterolateral,  septal, and anterior LV segments. The apex appears akinetic. Pattern  concerning for wrap-around LAD lesion versus possible takotsubo  cardiomyopathy.. Left ventricular ejection fraction,  by estimation, is 20 to 25%. The left ventricle has severely decreased  function. There is mild concentric left ventricular  hypertrophy. Left  ventricular diastolic parameters are indeterminate.  2. Right ventricular systolic function is mildly reduced. The right  ventricular size is normal.  3. The mitral valve is normal in structure. Trivial mitral valve  regurgitation.  4. The aortic valve was not well visualized. Aortic valve regurgitation  is not visualized.  5. The inferior vena cava is dilated in size with <50% respiratory  variability, suggesting right atrial pressure of 15 mmHg.   Patient Profile     49 y.o. female w/ PMH of HTN, HLD, Type 2 DM and GERD who is currently admitted for sepsis secondary to PNA and UTI. Cardiology consulted for elevated troponin and new cardiomyopathy with EF at 20-25%.   Assessment & Plan    1. NSTEMI/Acute Systolic CHF - HS Troponin values peaked at 2886 this admission in the setting of severe sepsis, lactic acidosis and DKA. Possibly secondary to demand ischemia but need to rule-out obstructive disease given her significant trend and new cardiomyopathy as echo showed a reduced EF of 20-25% with the apex appearing akinetic as outlined above and concerning for LAD lesion versus takotsubo cardiomyopathy.  - R/LHC previously recommended by Dr. Harl Bowie and scheduled for today. Risks and benefits previously reviewed and she has no further questions at this time.  - Remains on ASA and Heparin for her NSTEMI. Statin therapy initially held due to elevated LFT's but reordered by the admitting team to resume today given improvement.  - She has also been started on Losartan 12.43m daily and Toprol-XL 12.559mdaily for her cardiomyopathy. Diuretic therapy has been held as she appears euvolemic on examination (recorded output of -7.1 L this admission). Consider switching Losartan to Entresto at the time of discharge or as an outpatient if BP allows.   2. Severe Sepsis - Felt to be secondary to PNA vs UTI. Lactic Acid peaked at 4.7 but has now normalized. She remains on Augmentin and  Azithromycin. Further management per admitting team.   3. DKA - Now resolved. Hgb A1c at 11.1. Per admitting team.   4. Elevated LFT's - AST 925 and ALT 756 on admission, improved to 192 and 161 today. PTA Atorvastatin has been reordered by the admitting team.   For questions or updates, please contact [facility] consult [email or web address] for contact info under Cardiology/STEMI.   SiArna Medici PA-C 8:57 AM 09/20/2020 Pager: [phone] Attending note:  Chart reviewed including recommendations by Dr. BrHarl Bowiecase discussed with Ms. StDelano Metz I agree with her above findings.  Ms. MiRoebuckas been hospitalized with sepsis  in the setting of pneumonia and UTI, clinically improving on antibiotics and afebrile.  During the course of her evaluation she has been found to have a newly documented cardiomyopathy with LVEF 20 to 25%, apical wall motion abnormality suggesting either LAD distribution disease or possibly stress-induced cardiomyopathy.  Cardiac markers are consistent with NSTEMI with peak high-sensitivity troponin I of 2886.    She is chest pain-free and hemodynamically stable at this time.  Pertinent lab work includes potassium 3.7, creatinine 0.63, BNP 345, hemoglobin 11.0, platelets 162.  Current cardiac regimen includes aspirin, Lipitor (recently resumed with improving transaminitis), IV heparin, losartan, and Toprol-XL.  Plan is for transfer to [facility] in anticipation of a diagnostic right and left heart catheterization.  Risks and benefits already reviewed with the patient and orders are completed.  Satira Sark, M.D., F.A.C.C.
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
## 2020-09-20 NOTE — Progress Notes (Signed)
PROGRESS NOTE    Yolanda [name]  DPO:[id number] DOB: 27-Sep-1971 DOA: 09/16/2020 PCP: Lucia Gaskins, MD   Brief Narrative:  Yolanda [name] a48 y.o.female,with history of type 2 diabetes mellitus, panic disorder, GERD, essential hypertension, dyslipidemia, psychiatric disorders, and more presents to the ED with a chief complaint of "I was freezing cold." Patient reports that she had 2 episodes of freezing cold. The first was on 10/14. She called EMS, and is not able to explain why she was not transported to the hospital at that time or what their findings were. Again on October 15 she had another episode of freezing cold at night. She called EMS again and when they arrived they found her oxygen saturations to be between 40 and 60% they placed her on nonrebreather and brought her into the ER. Patient reports that for the past couple of days she has had fatigue and weakness. She does report difficulty breathing. She cannot think of anything that makes it worse or better. She has had a cough that is dry. She reports she has had body aches, and nausea but no vomiting no diarrhea no abdominal pain. Patient reports that she has not checked her temperature at home but she has had shaking chills. Patient is not vaccinated for Covid because she "does not trust the vaccine." Patient denies chest pain, feeling like her heart is racing, and presyncopal symptoms. Patient denies sick contacts.  Patient does admit that since she has not been feeling well she has likely missed several doses of her insulin. Patient is on intensive insulin regimen with 20 units of Humalog with each meal, and 7 units of Tresiba at night. Patient reports that she has had polyuria and polydipsia. She does not know what her sugars have been running at home. Her last hemoglobin A1c in our system was almost 10, but that was a couple years ago.  10/20: Patient will transfer to [facility] for left and right heart  catheterization based on decreased LVEF and wall motion abnormalities noted on 2D echocardiogram.  Plan to discontinue azithromycin today after 5 days of treatment continue Augmentin for 2 more days.  DKA has resolved and hemoglobin A1c is 11.1%.  She will need outpatient endocrinology follow-up.  LFTs improving.  Assessment & Plan:   Active Problems:   Hypertension   Bipolar 1 disorder (HCC)   Type 2 diabetes mellitus, uncontrolled (HCC)   Urinary tract infection, site not specified   Severe sepsis (HCC)   Acute respiratory failure with hypoxia (HCC)   NSTEMI (non-ST elevated myocardial infarction) ([place])   DKA, type 2 ([place])   CAP (community acquired pneumonia)   AKI (acute kidney injury) ([place])   Acute systolic CHF (congestive heart failure) ([place])   Severe sepsis due to E. Coli-improved -Patient presented with severe sepsis with fever(temp of 105F), tachycardia, leukocytosis -Source is likely pneumonia/UTI -Lactic acid elevated at 4.7 on admission, which normalized with IV fluids -Initially treated with IV antibiotics which have since been transitioned to orals based on cultures sensitivities -Blood cultures show 1 out of 2 positive cultures for E. Coli -hemodynamics are stable -Continue on Augmentin for 2 more days to complete treatment by 10/22   Acute respiratory failure with hypoxia-now resolved -Oxygen saturation was reportedly down to the 40s on room air on admission -Concern for developing pneumonia -CT chest negative for pulmonary embolus, but did demonstrate bilateral infiltrates -Initially on BiPAP, has since been weaned down to room air -Covid test is negative x 2  NSTEMI -Initial  troponin found to be 38, which then peaked at 2886 -EKG shows sinus tachycardia without acute ischemic changes -Patient denies any chest pain at this time. -Discussed with Dr. Debara Pickett and it was felt with degree of rise of troponin, she likely has had an infarct -She is on heparin  infusion, aspirin and beta-blockers, ARB -Holding statin for now in light of elevated LFTs -Echo shows EF of 20 to 25% with wall motion abnormalities.   -Cardiology following, plans for cardiac catheterization  today 10/20 -Patient will transfer to [facility]   Acute systolic congestive heart failure -EF of 20 to 25% on echocardiogram -Potentially Takotsubo cardiomyopathy versus LAD disease -on BB and ARB -Plans are for cardiac catheterization tomorrow -will initiate transfer to [facility] today -Appreciate cardiology assistance -Does not appear volume overloaded at this time.  Diabetic ketoacidosis, type 2 -Blood sugar of494on admission with a serum bicarb of 15 and an anion gap of 23 -She is chronically on Tresiba, Ozempic, canagliflozin, Metformin. -Patient was placed on DKA protocol with IV insulin and IV fluids -Anion gap has closed and she has been transitioned back to subcutaneous insulin -Blood sugars have been stable -Hemoglobin A1c 11.1% -Patient will need outpatient follow-up with Dr. Dorris Fetch rearranged once again  Community-acquired pneumonia -Currently resolved with plan to discontinue azithromycin after today's dose  Urinary tract infection due to E. coli -Continue Augmentin for 2 more days to complete total 7-day course of treatment  Lactic acidosis-resolved -Likely related to sepsis and Metformin use -Lactic acid normalized with IV fluids  Acute kidney injury-resolved -Baseline creatinine is 0.9 -Creatinine on admission 1.39 -Likely related to dehydration/sepsis -Creatinine improved to 0.6 with hydration  Elevated LFTs-downtrending -s/p cholecystectomy -no significant RUQ tenderness -suspect this is related to sepsis -Trending down, continue to follow -Statin has been reordered, continue to trend  Hypertension -Started on low-dose Toprol and losartan -Blood pressures have been stable  Diarrhea -Reports multiple loose stools since admission -C.  difficile negative -Use Imodium as needed -Overall improved  Hypokalemia. -replaced -Continue to follow a.m. labs   DVT prophylaxis: SCDs Start: 09/16/20 0550, Heparin infusion  Code Status: Full code Family Communication: Discussed with patient Disposition Plan: Status is: Inpatient, Transfer to [facility] for cardiac cath  Remains inpatient appropriate because:Inpatient level of care appropriate due to severity of illness   Dispo: The patient is from: Home  Anticipated d/c is to: Home  Anticipated d/c date is: 2 days  Patient currently is not medically stable to d/c.   Consultants:   Cardiology  Procedures:   Echo  Antimicrobials:   Ceftriaxone 10/16 >10/19  Azithromycin 10/16 >10/20  Augmentin 10/19>   Subjective: Patient seen and evaluated today with no new acute complaints or concerns. No acute concerns or events noted overnight.  She denies any chest pain or shortness of breath.  She has remained n.p.o. after midnight in anticipation of cardiac catheterization this morning.  Blood glucose levels have remained stable.  Objective: Vitals:   09/19/20 1941 09/19/20 2000 09/20/20 0800 09/20/20 1017  BP:  135/87 127/85   Pulse:  (!) 112 100   Resp:  (!) 21 19   Temp:   (!) 97.3 F (36.3 C)   TempSrc:   Oral   SpO2: 96% 95% 96% 95%  Weight:      Height:        Intake/Output Summary (Last 24 hours) at 09/20/2020 1025 Last data filed at 09/20/2020 0900 Gross per 24 hour  Intake --  Output 5850 ml  Net -5850 ml   Filed Weights   09/16/20 0216 09/16/20 1000 09/18/20 0406  Weight: 105 kg 107.3 kg 108.8 kg    Examination:  General exam: Appears calm and comfortable  Respiratory system: Clear to auscultation. Respiratory effort normal. Cardiovascular system: S1 & S2 heard, RRR. Gastrointestinal system: Abdomen is nondistended, soft and nontender. Central nervous system: Alert and oriented. No focal neurological  deficits. Extremities: Symmetric 5 x 5 power. Skin: No rashes, lesions or ulcers Psychiatry: Judgement and insight appear normal. Mood & affect appropriate.     Data Reviewed: I have personally reviewed following labs and imaging studies  CBC: Recent Labs  Lab 09/16/20 0203 09/16/20 0203 09/17/20 0600 09/17/20 1014 09/18/20 0528 09/19/20 0420 09/20/20 0313  WBC 11.3*   < > 11.4* 9.9 8.2 8.4 7.4  NEUTROABS 4.8  --  8.6*  --   --   --   --   HGB 15.0   < > 12.5 10.7* 12.0 10.6* 11.0*  HCT 48.2*   < > 40.5 34.3* 39.2 35.6* 35.1*  MCV 88.8   < > 88.6 89.3 88.1 90.6 87.1  PLT 291   < > 145* 135* 156 133* 162   < > = values in this interval not displayed.   Basic Metabolic Panel: Recent Labs  Lab 09/16/20 1900 09/17/20 0600 09/18/20 0528 09/19/20 0420 09/20/20 0313  NA 138 139 139 140 140  K 2.9* 4.1 3.5 3.5 3.7  CL 107 106 105 110 107  CO2 20* 20* 20* 21* 21*  GLUCOSE 145* 203* 171* 97 118*  BUN 18 17 12 13 13   CREATININE 0.95 0.89 0.78 0.73 0.63  CALCIUM 8.2* 8.2* 8.5* 8.1* 8.6*  MG  --  1.8  --   --   --    GFR: Estimated Creatinine Clearance: 110.5 mL/min (by C-G formula based on SCr of 0.63 mg/dL). Liver Function Tests: Recent Labs  Lab 09/16/20 0203 09/17/20 0600 09/18/20 0528 09/19/20 0420 09/20/20 0313  AST 73* 925* 201* 62* 38  ALT 65* 756* 508* 275* 192*  ALKPHOS 107 98 111 131* 161*  BILITOT 0.9 1.1 1.2 0.8 0.8  PROT 7.6 6.1* 6.7 6.1* 6.4*  ALBUMIN 3.6 2.9* 2.9* 2.6* 2.7*   No results for input(s): LIPASE, AMYLASE in the last 168 hours. No results for input(s): AMMONIA in the last 168 hours. Coagulation Profile: Recent Labs  Lab 09/17/20 0600  INR 1.1   Cardiac Enzymes: No results for input(s): CKTOTAL, CKMB, CKMBINDEX, TROPONINI in the last 168 hours. BNP (last 3 results) No results for input(s): PROBNP in the last 8760 hours. HbA1C: No results for input(s): HGBA1C in the last 72 hours. CBG: Recent Labs  Lab 09/19/20 0710  09/19/20 1116 09/19/20 1606 09/19/20 1958 09/20/20 0751  GLUCAP 81 116* 91 169* 114*   Lipid Profile: No results for input(s): CHOL, HDL, LDLCALC, TRIG, CHOLHDL, LDLDIRECT in the last 72 hours. Thyroid Function Tests: No results for input(s): TSH, T4TOTAL, FREET4, T3FREE, THYROIDAB in the last 72 hours. Anemia Panel: No results for input(s): VITAMINB12, FOLATE, FERRITIN, TIBC, IRON, RETICCTPCT in the last 72 hours. Sepsis Labs: Recent Labs  Lab 09/16/20 0203 09/16/20 0251 09/16/20 0507 09/16/20 0742 09/17/20 0600 09/17/20 1634  PROCALCITON 12.79  --   --   --  44.09  --   LATICACIDVEN  --  4.7* 4.6* 2.8*  --  1.8    Recent Results (from the past 240 hour(s))  Respiratory Panel by RT PCR (Flu A&B, Covid) -  Nasopharyngeal Swab     Status: None   Collection Time: 09/16/20  2:12 AM   Specimen: Nasopharyngeal Swab  Result Value Ref Range Status   SARS Coronavirus 2 by RT PCR NEGATIVE NEGATIVE Final    Comment: (NOTE) SARS-CoV-2 target nucleic acids are NOT DETECTED.  The SARS-CoV-2 RNA is generally detectable in upper respiratoy specimens during the acute phase of infection. The lowest concentration of SARS-CoV-2 viral copies this assay can detect is 131 copies/mL. A negative result does not preclude SARS-Cov-2 infection and should not be used as the sole basis for treatment or other patient management decisions. A negative result may occur with  improper specimen collection/handling, submission of specimen other than nasopharyngeal swab, presence of viral mutation(s) within the areas targeted by this assay, and inadequate number of viral copies (<131 copies/mL). A negative result must be combined with clinical observations, patient history, and epidemiological information. The expected result is Negative.  Fact Sheet for Patients:  PinkCheek.be  Fact Sheet for Healthcare Providers:  GravelBags.it  This test is  no t yet approved or cleared by the [place] FDA and  has been authorized for detection and/or diagnosis of SARS-CoV-2 by FDA under an Emergency Use Authorization (EUA). This EUA will remain  in effect (meaning this test can be used) for the duration of the COVID-19 declaration under Section 564(b)(1) of the Act, 21 U.S.C. section 360bbb-3(b)(1), unless the authorization is terminated or revoked sooner.     Influenza A by PCR NEGATIVE NEGATIVE Final   Influenza B by PCR NEGATIVE NEGATIVE Final    Comment: (NOTE) The Xpert Xpress SARS-CoV-2/FLU/RSV assay is intended as an aid in  the diagnosis of influenza from Nasopharyngeal swab specimens and  should not be used as a sole basis for treatment. Nasal washings and  aspirates are unacceptable for Xpert Xpress SARS-CoV-2/FLU/RSV  testing.  Fact Sheet for Patients: PinkCheek.be  Fact Sheet for Healthcare Providers: GravelBags.it  This test is not yet approved or cleared by the [place] FDA and  has been authorized for detection and/or diagnosis of SARS-CoV-2 by  FDA under an Emergency Use Authorization (EUA). This EUA will remain  in effect (meaning this test can be used) for the duration of the  Covid-19 declaration under Section 564(b)(1) of the Act, 21  U.S.C. section 360bbb-3(b)(1), unless the authorization is  terminated or revoked. Performed at [facility], [address], [place], [place] [address]   Urine culture     Status: Abnormal   Collection Time: 09/16/20  2:35 AM   Specimen: Urine, Catheterized  Result Value Ref Range Status   Specimen Description   Final    URINE, CATHETERIZED Performed at [facility], [address], [place], [place] [address]    Special Requests   Final    NONE Performed at [facility], [address], [place],  [address]    Culture >=100,000 COLONIES/mL ESCHERICHIA COLI (A)  Final   Report Status 09/18/2020 FINAL   Final   Organism ID, Bacteria ESCHERICHIA COLI (A)  Final      Susceptibility   Escherichia coli - MIC*    AMPICILLIN 4 SENSITIVE Sensitive     CEFAZOLIN <=4 SENSITIVE Sensitive     CEFTRIAXONE <=0.25 SENSITIVE Sensitive     CIPROFLOXACIN <=0.25 SENSITIVE Sensitive     GENTAMICIN <=1 SENSITIVE Sensitive     IMIPENEM <=0.25 SENSITIVE Sensitive     NITROFURANTOIN <=16 SENSITIVE Sensitive     TRIMETH/SULFA <=20 SENSITIVE Sensitive  AMPICILLIN/SULBACTAM <=2 SENSITIVE Sensitive     PIP/TAZO <=4 SENSITIVE Sensitive     * >=100,000 COLONIES/mL ESCHERICHIA COLI  Blood Culture (routine x 2)     Status: Abnormal (Preliminary result)   Collection Time: 09/16/20  2:52 AM   Specimen: Left Antecubital; Blood  Result Value Ref Range Status   Specimen Description   Final    LEFT ANTECUBITAL Performed at [facility], [address], [place], [place] [address]    Special Requests   Final    BOTTLES DRAWN AEROBIC AND ANAEROBIC Blood Culture adequate volume Performed at [facility], [address] [place], [place] [address]    Culture  Setup Time   Final    GRAM NEGATIVE RODS CRITICAL RESULT CALLED TO, READ BACK BY AND VERIFIED WITH: RN C ALSTON [id number] AT 859 AM BY CM Performed at [facility], [place] [address] [place], [place] [address]    Culture ESCHERICHIA COLI (A)  Final   Report Status PENDING  Incomplete   Organism ID, Bacteria ESCHERICHIA COLI  Final      Susceptibility   Escherichia coli - MIC*    AMPICILLIN 4 SENSITIVE Sensitive     CEFAZOLIN <=4 SENSITIVE Sensitive     CEFEPIME <=0.12 SENSITIVE Sensitive     CEFTAZIDIME <=1 SENSITIVE Sensitive     CEFTRIAXONE <=0.25 SENSITIVE Sensitive     CIPROFLOXACIN <=0.25 SENSITIVE Sensitive     GENTAMICIN <=1 SENSITIVE Sensitive     IMIPENEM <=0.25 SENSITIVE Sensitive     TRIMETH/SULFA <=20 SENSITIVE Sensitive     AMPICILLIN/SULBACTAM <=2 SENSITIVE Sensitive     PIP/TAZO <=4 SENSITIVE Sensitive     * ESCHERICHIA COLI  Blood  Culture ID Panel (Reflexed)     Status: Abnormal   Collection Time: 09/16/20  2:52 AM  Result Value Ref Range Status   Enterococcus faecalis NOT DETECTED NOT DETECTED Final   Enterococcus Faecium NOT DETECTED NOT DETECTED Final   Listeria monocytogenes NOT DETECTED NOT DETECTED Final   Staphylococcus species NOT DETECTED NOT DETECTED Final   Staphylococcus aureus (BCID) NOT DETECTED NOT DETECTED Final   Staphylococcus epidermidis NOT DETECTED NOT DETECTED Final   Staphylococcus lugdunensis NOT DETECTED NOT DETECTED Final   Streptococcus species NOT DETECTED NOT DETECTED Final   Streptococcus agalactiae NOT DETECTED NOT DETECTED Final   Streptococcus pneumoniae NOT DETECTED NOT DETECTED Final   Streptococcus pyogenes NOT DETECTED NOT DETECTED Final   A.calcoaceticus-baumannii NOT DETECTED NOT DETECTED Final   Bacteroides fragilis NOT DETECTED NOT DETECTED Final   Enterobacterales DETECTED (A) NOT DETECTED Final    Comment: Enterobacterales represent a large order of gram negative bacteria, not a single organism. CRITICAL RESULT CALLED TO, READ BACK BY AND VERIFIED WITH: RN C ALSTON [id number] AT 900 AM BY CM    Enterobacter cloacae complex NOT DETECTED NOT DETECTED Final   Escherichia coli DETECTED (A) NOT DETECTED Final    Comment: CRITICAL RESULT CALLED TO, READ BACK BY AND VERIFIED WITH: RN C ALSTON [id number] AT 900 AM BY CM    Klebsiella aerogenes NOT DETECTED NOT DETECTED Final   Klebsiella oxytoca NOT DETECTED NOT DETECTED Final   Klebsiella pneumoniae NOT DETECTED NOT DETECTED Final   Proteus species NOT DETECTED NOT DETECTED Final   Salmonella species NOT DETECTED NOT DETECTED Final   Serratia marcescens NOT DETECTED NOT DETECTED Final   Haemophilus influenzae NOT DETECTED NOT DETECTED Final   Neisseria meningitidis NOT DETECTED NOT DETECTED Final   Pseudomonas aeruginosa NOT DETECTED NOT DETECTED  Final   Stenotrophomonas maltophilia NOT DETECTED NOT DETECTED Final   Candida  albicans NOT DETECTED NOT DETECTED Final   Candida auris NOT DETECTED NOT DETECTED Final   Candida glabrata NOT DETECTED NOT DETECTED Final   Candida krusei NOT DETECTED NOT DETECTED Final   Candida parapsilosis NOT DETECTED NOT DETECTED Final   Candida tropicalis NOT DETECTED NOT DETECTED Final   Cryptococcus neoformans/gattii NOT DETECTED NOT DETECTED Final   CTX-M ESBL NOT DETECTED NOT DETECTED Final   Carbapenem resistance IMP NOT DETECTED NOT DETECTED Final   Carbapenem resistance KPC NOT DETECTED NOT DETECTED Final   Carbapenem resistance NDM NOT DETECTED NOT DETECTED Final   Carbapenem resist OXA 48 LIKE NOT DETECTED NOT DETECTED Final   Carbapenem resistance VIM NOT DETECTED NOT DETECTED Final    Comment: Performed at [facility], [address] [place], [place] [address]  Blood Culture (routine x 2)     Status: None (Preliminary result)   Collection Time: 09/16/20  3:09 AM   Specimen: BLOOD LEFT HAND  Result Value Ref Range Status   Specimen Description BLOOD LEFT HAND  Final   Special Requests   Final    BOTTLES DRAWN AEROBIC AND ANAEROBIC Blood Culture adequate volume   Culture   Final    NO GROWTH 4 DAYS Performed at [facility], [address], [place], [place] [address]    Report Status PENDING  Incomplete  Respiratory Panel by RT PCR (Flu A&B, Covid) - Nasopharyngeal Swab     Status: None   Collection Time: 09/16/20  6:03 AM   Specimen: Nasopharyngeal Swab  Result Value Ref Range Status   SARS Coronavirus 2 by RT PCR NEGATIVE NEGATIVE Final    Comment: (NOTE) SARS-CoV-2 target nucleic acids are NOT DETECTED.  The SARS-CoV-2 RNA is generally detectable in upper respiratoy specimens during the acute phase of infection. The lowest concentration of SARS-CoV-2 viral copies this assay can detect is 131 copies/mL. A negative result does not preclude SARS-Cov-2 infection and should not be used as the sole basis for treatment or other patient management decisions.  A negative result may occur with  improper specimen collection/handling, submission of specimen other than nasopharyngeal swab, presence of viral mutation(s) within the areas targeted by this assay, and inadequate number of viral copies (<131 copies/mL). A negative result must be combined with clinical observations, patient history, and epidemiological information. The expected result is Negative.  Fact Sheet for Patients:  PinkCheek.be  Fact Sheet for Healthcare Providers:  GravelBags.it  This test is no t yet approved or cleared by the [place] FDA and  has been authorized for detection and/or diagnosis of SARS-CoV-2 by FDA under an Emergency Use Authorization (EUA). This EUA will remain  in effect (meaning this test can be used) for the duration of the COVID-19 declaration under Section 564(b)(1) of the Act, 21 U.S.C. section 360bbb-3(b)(1), unless the authorization is terminated or revoked sooner.     Influenza A by PCR NEGATIVE NEGATIVE Final   Influenza B by PCR NEGATIVE NEGATIVE Final    Comment: (NOTE) The Xpert Xpress SARS-CoV-2/FLU/RSV assay is intended as an aid in  the diagnosis of influenza from Nasopharyngeal swab specimens and  should not be used as a sole basis for treatment. Nasal washings and  aspirates are unacceptable for Xpert Xpress SARS-CoV-2/FLU/RSV  testing.  Fact Sheet for Patients: PinkCheek.be  Fact Sheet for Healthcare Providers: GravelBags.it  This test is not yet approved or cleared by the [place]  FDA and  has been authorized for detection and/or diagnosis of SARS-CoV-2 by  FDA under an Emergency Use Authorization (EUA). This EUA will remain  in effect (meaning this test can be used) for the duration of the  Covid-19 declaration under Section 564(b)(1) of the Act, 21  U.S.C. section 360bbb-3(b)(1), unless the  authorization is  terminated or revoked. Performed at [facility], [address], [place], [place] [address]   MRSA PCR Screening     Status: None   Collection Time: 09/16/20  9:45 AM   Specimen: Nasal Mucosa; Nasopharyngeal  Result Value Ref Range Status   MRSA by PCR NEGATIVE NEGATIVE Final    Comment:        The GeneXpert MRSA Assay (FDA approved for NASAL specimens only), is one component of a comprehensive MRSA colonization surveillance program. It is not intended to diagnose MRSA infection nor to guide or monitor treatment for MRSA infections. Performed at [facility], [address] [place], [place] [address]   C Difficile Quick Screen w PCR reflex     Status: None   Collection Time: 09/17/20  3:54 PM   Specimen: STOOL  Result Value Ref Range Status   C Diff antigen NEGATIVE NEGATIVE Final   C Diff toxin NEGATIVE NEGATIVE Final   C Diff interpretation No C. difficile detected.  Final    Comment: Performed at [facility], [address], [place], [place] [address]         Radiology Studies: No results found.      Scheduled Meds: . [MAR Hold] amoxicillin-clavulanate  1 tablet Oral Q12H  . [MAR Hold] aspirin EC  81 mg Oral Daily  . [MAR Hold] atorvastatin  40 mg Oral QHS  . [MAR Hold] azithromycin  500 mg Oral Daily  . [MAR Hold] busPIRone  15 mg Oral BID  . [MAR Hold] Chlorhexidine Gluconate Cloth  6 each Topical Daily  . [MAR Hold] insulin aspart  0-20 Units Subcutaneous TID WC  . [MAR Hold] insulin aspart  0-5 Units Subcutaneous QHS  . [MAR Hold] insulin glargine  70 Units Subcutaneous Q24H  . [MAR Hold] losartan  12.5 mg Oral Daily  . [MAR Hold] metoprolol succinate  12.5 mg Oral Daily  . [MAR Hold] mirabegron ER  50 mg Oral Daily  . [MAR Hold] pantoprazole  40 mg Oral Daily  . [MAR Hold] PARoxetine  60 mg Oral Daily  . [MAR Hold] pregabalin  300 mg Oral Q12H  . [MAR Hold] QUEtiapine  100 mg Oral QHS  . [MAR Hold] sodium chloride flush  3 mL  Intravenous Q12H   Continuous Infusions: . sodium chloride    . sodium chloride 1 mL/kg/hr (09/20/20 1012)  . heparin Stopped (09/20/20 1001)  . insulin Stopped (09/16/20 2000)     LOS: 4 days    Time spent: 35 minutes    Ricardo Kayes Darleen Crocker, DO [facility]  If 7PM-7AM, please contact night-coverage [email or web address] 09/20/2020, 10:25 AM

## 2020-09-20 NOTE — H&P (View-Only) (Signed)
Progress Note  Patient Name: Yolanda [name] Date of Encounter: 09/20/2020  Primary Cardiologist: Carlyle Dolly, MD   Subjective   No chest pain overnight. Breathing improved. She has been NPO since midnight for planned cardiac catheterization later today.   Inpatient Medications    Scheduled Meds: . amoxicillin-clavulanate  1 tablet Oral Q12H  . aspirin  81 mg Oral Pre-Cath  . aspirin EC  81 mg Oral Daily  . atorvastatin  40 mg Oral QHS  . azithromycin  500 mg Oral Daily  . busPIRone  15 mg Oral BID  . Chlorhexidine Gluconate Cloth  6 each Topical Daily  . insulin aspart  0-20 Units Subcutaneous TID WC  . insulin aspart  0-5 Units Subcutaneous QHS  . insulin glargine  70 Units Subcutaneous Q24H  . losartan  12.5 mg Oral Daily  . metoprolol succinate  12.5 mg Oral Daily  . mirabegron ER  50 mg Oral Daily  . pantoprazole  40 mg Oral Daily  . PARoxetine  60 mg Oral Daily  . pregabalin  300 mg Oral Q12H  . QUEtiapine  100 mg Oral QHS  . sodium chloride flush  3 mL Intravenous Q12H   Continuous Infusions: . sodium chloride    . heparin 2,500 Units/hr (09/20/20 0436)  . insulin Stopped (09/16/20 2000)   PRN Meds: acetaminophen **OR** acetaminophen, albuterol, alum & mag hydroxide-simeth, clonazePAM, dextrose, ondansetron **OR** ondansetron (ZOFRAN) IV, oxyCODONE, polyethylene glycol   Vital Signs    Vitals:   09/19/20 1604 09/19/20 1941 09/19/20 2000 09/20/20 0800  BP:   135/87 127/85  Pulse: 99  (!) 112 100  Resp: 19  (!) 21 19  Temp: 98.2 F (36.8 C)     TempSrc: Oral     SpO2: 96% 96% 95% 96%  Weight:      Height:        Intake/Output Summary (Last 24 hours) at 09/20/2020 0857 Last data filed at 09/19/2020 1600 Gross per 24 hour  Intake --  Output 2200 ml  Net -2200 ml    Last 3 Weights 09/18/2020 09/16/2020 09/16/2020  Weight (lbs) 239 lb 13.8 oz 236 lb 8.9 oz 231 lb 7.7 oz  Weight (kg) 108.8 kg 107.3 kg 105 kg      Telemetry    Sinus  rhythm, HR in 90's to low-100's.  - Personally Reviewed  ECG    No new tracings.   Physical Exam   General: Well developed, female appearing in no acute distress. Head: Normocephalic, atraumatic.  Neck: Supple without bruits, JVD not elevated. Lungs:  Resp regular and unlabored, CTA without wheezing or rales. Heart: RRR, S1, S2, no S3, S4, or murmur; no rub. Abdomen: Soft, non-tender, non-distended. Extremities: No clubbing, cyanosis, or lower extremity edema. Distal pedal pulses are 2+ bilaterally. Neuro: Alert and oriented X 3. Moves all extremities spontaneously. Psych: Normal affect.  Labs    Chemistry Recent Labs  Lab 09/18/20 0528 09/19/20 0420 09/20/20 0313  NA 139 140 140  K 3.5 3.5 3.7  CL 105 110 107  CO2 20* 21* 21*  GLUCOSE 171* 97 118*  BUN 12 13 13   CREATININE 0.78 0.73 0.63  CALCIUM 8.5* 8.1* 8.6*  PROT 6.7 6.1* 6.4*  ALBUMIN 2.9* 2.6* 2.7*  AST 201* 62* 38  ALT 508* 275* 192*  ALKPHOS 111 131* 161*  BILITOT 1.2 0.8 0.8  GFRNONAA >60 >60 >60  ANIONGAP 14 9 12      Hematology Recent Labs  Lab 09/18/20 [phone]  09/19/20 0420 09/20/20 0313  WBC 8.2 8.4 7.4  RBC 4.45 3.93 4.03  HGB 12.0 10.6* 11.0*  HCT 39.2 35.6* 35.1*  MCV 88.1 90.6 87.1  MCH 27.0 27.0 27.3  MCHC 30.6 29.8* 31.3  RDW 15.5 15.6* 15.6*  PLT 156 133* 162    BNP Recent Labs  Lab 09/16/20 0203 09/19/20 0420  BNP 110.0* 345.0*     DDimer  Recent Labs  Lab 09/16/20 0203  DDIMER 9.53*     Radiology    No results found.  Cardiac Studies   Echocardiogram: 09/2020 IMPRESSIONS   1. There is severe hypokinesis of the mid-to-distal anterolateral,  septal, and anterior LV segments. The apex appears akinetic. Pattern  concerning for wrap-around LAD lesion versus possible takotsubo  cardiomyopathy.. Left ventricular ejection fraction,  by estimation, is 20 to 25%. The left ventricle has severely decreased  function. There is mild concentric left ventricular  hypertrophy. Left  ventricular diastolic parameters are indeterminate.  2. Right ventricular systolic function is mildly reduced. The right  ventricular size is normal.  3. The mitral valve is normal in structure. Trivial mitral valve  regurgitation.  4. The aortic valve was not well visualized. Aortic valve regurgitation  is not visualized.  5. The inferior vena cava is dilated in size with <50% respiratory  variability, suggesting right atrial pressure of 15 mmHg.   Patient Profile     49 y.o. female w/ PMH of HTN, HLD, Type 2 DM and GERD who is currently admitted for sepsis secondary to PNA and UTI. Cardiology consulted for elevated troponin and new cardiomyopathy with EF at 20-25%.   Assessment & Plan    1. NSTEMI/Acute Systolic CHF - HS Troponin values peaked at 2886 this admission in the setting of severe sepsis, lactic acidosis and DKA. Possibly secondary to demand ischemia but need to rule-out obstructive disease given her significant trend and new cardiomyopathy as echo showed a reduced EF of 20-25% with the apex appearing akinetic as outlined above and concerning for LAD lesion versus takotsubo cardiomyopathy.  - R/LHC previously recommended by Dr. Harl Bowie and scheduled for today. Risks and benefits previously reviewed and she has no further questions at this time.  - Remains on ASA and Heparin for her NSTEMI. Statin therapy initially held due to elevated LFT's but reordered by the admitting team to resume today given improvement.  - She has also been started on Losartan 12.56m daily and Toprol-XL 12.558mdaily for her cardiomyopathy. Diuretic therapy has been held as she appears euvolemic on examination (recorded output of -7.1 L this admission). Consider switching Losartan to Entresto at the time of discharge or as an outpatient if BP allows.   2. Severe Sepsis - Felt to be secondary to PNA vs UTI. Lactic Acid peaked at 4.7 but has now normalized. She remains on Augmentin and  Azithromycin. Further management per admitting team.   3. DKA - Now resolved. Hgb A1c at 11.1. Per admitting team.   4. Elevated LFT's - AST 925 and ALT 756 on admission, improved to 192 and 161 today. PTA Atorvastatin has been reordered by the admitting team.   For questions or updates, please contact [facility] consult [email or web address] for contact info under Cardiology/STEMI.   SiArna Medici PA-C 8:57 AM 09/20/2020 Pager: [phone] Attending note:  Chart reviewed including recommendations by Dr. BrHarl Bowiecase discussed with Ms. StDelano Metz I agree with her above findings.  Ms. MiLeiberas been hospitalized with sepsis  in the setting of pneumonia and UTI, clinically improving on antibiotics and afebrile.  During the course of her evaluation she has been found to have a newly documented cardiomyopathy with LVEF 20 to 25%, apical wall motion abnormality suggesting either LAD distribution disease or possibly stress-induced cardiomyopathy.  Cardiac markers are consistent with NSTEMI with peak high-sensitivity troponin I of 2886.    She is chest pain-free and hemodynamically stable at this time.  Pertinent lab work includes potassium 3.7, creatinine 0.63, BNP 345, hemoglobin 11.0, platelets 162.  Current cardiac regimen includes aspirin, Lipitor (recently resumed with improving transaminitis), IV heparin, losartan, and Toprol-XL.  Plan is for transfer to [facility] in anticipation of a diagnostic right and left heart catheterization.  Risks and benefits already reviewed with the patient and orders are completed.  Satira Sark, M.D., F.A.C.C.

## 2020-09-20 NOTE — Plan of Care (Signed)

## 2020-09-20 NOTE — Progress Notes (Signed)
[facility] PROGRESS NOTE    Progress Note  Yolanda [name]  URK:[id number] DOB: 10/11/1971 DOA: 09/16/2020 PCP: Lucia Gaskins, MD     Brief Narrative:   Yolanda [name] is an 49 y.o. female past medical history of diabetes mellitus type 2, panic disorder, essential hypertension psychiatric disorder comes into the ED with chief complains of I was freezing when she came into the ED she was saturating 40% on room air placed on a nonrebreather.  She has not received her Covid vaccine, she does relate missing several doses of her insulin.  Assessment/Plan:   Severe sepsis due to E. coli: Likely due to UTI as urine culture was positive for E. coli After fluid resuscitation and IV empiric antibiotics sepsis physiology resolved. 1 out of 2 blood cultures for E. Coli. She is currently on amoxicillin and azithromycin, will discontinue azithromycin at fifth dose. We will continue Augmentin.  Acute respiratory failure with hypoxia possibly due to pneumonia: Setting in the 40s on arrival to the ED CT angio of the chest negative but it did show bilateral infiltrates Covid test is negative x2.  Elevated troponin question Takotsubo cardiomyopathy: With troponins peaked at 3000 EKG shows sinus tachycardia, cardiology was consulted she was started on IV heparin infusion, aspirin beta-blockers and ARB. Can resume statins. 2D echo done on 09/16/2020 showed severe hypokinesia of the mid to distal anterior wall, septal and anterior, ultimately affects appear akinetic with an EF of 25% and mild left concentric hypertrophy. Keep the patient n.p.o. she is scheduled for cardiac cath on 09/20/2020. Cardiac cath was done that showed nonobstructive coronary artery disease cardiology recommended to continue beta-blocker aspirin and ACE inhibitor.  Acute systolic heart failure: With a new drop in EF by 2D echo to 25% Cardiology was consulted and concern about Takotsubo versus LAD disease. Continue  beta-blocker and ARB. Scheduled for cardiac cath on 09/20/2020.  She does not appear to be fluid overloaded.  DKA type II: She was started on IV insulin and IV fluids her DKA resolved. He has been transitioned to long-acting insulin plus sliding scale she is currently n.p.o.  Acute kidney injury: With a baseline creatinine of 0.9 on admission 1.3 resolved with IV fluid hydration.  Elevated LFTs: Status post cholecystectomy, likely due to sepsis.  They are trending down restart statins.  Essential hypertension: Continue Toprol and losartan.  Diarrhea: Likely due to antibiotics, C. difficile negative.  HypoKalemia: Replace orally.  Right total stage II pressure ulcer on admission RN Pressure Injury Documentation: Pressure Injury 09/16/20 Toe (Comment  which one) Anterior;Right Unstageable - Full thickness tissue loss in which the base of the injury is covered by slough (yellow, tan, gray, green or brown) and/or eschar (tan, brown or black) in the wound bed. yello (Active)  09/16/20 0237  Location: Toe (Comment  which one)  Location Orientation: Anterior;Right  Staging: Unstageable - Full thickness tissue loss in which the base of the injury is covered by slough (yellow, tan, gray, green or brown) and/or eschar (tan, brown or black) in the wound bed.  Wound Description (Comments): yello sloth persent.  Present on Admission: Yes     Pressure Injury 09/16/20 Toe (Comment  which one) Anterior;Left Unstageable - Full thickness tissue loss in which the base of the injury is covered by slough (yellow, tan, gray, green or brown) and/or eschar (tan, brown or black) in the wound bed. unable (Active)  09/16/20 0237  Location: Toe (Comment  which one)  Location Orientation: Anterior;Left  Staging:  Unstageable - Full thickness tissue loss in which the base of the injury is covered by slough (yellow, tan, gray, green or brown) and/or eschar (tan, brown or black) in the wound bed.  Wound  Description (Comments): unable to stage due to scab in place  Present on Admission: Yes    Estimated body mass index is 37.57 kg/m as calculated from the following:   Height as of this encounter: 5' 7"  (1.702 m).   Weight as of this encounter: 108.8 kg.   DVT prophylaxis: lovenxo Family Communication:none Status is: Inpatient  Remains inpatient appropriate because:Hemodynamically unstable   Dispo: The patient is from: Home              Anticipated d/c is to: Home              Anticipated d/c date is: 1 day              Patient currently is not medically stable to d/c.        Code Status:     Code Status Orders  (From admission, onward)         Start     Ordered   09/16/20 0550  Full code  Continuous        09/16/20 0549        Code Status History    This patient has a current code status but no historical code status.   Advance Care Planning Activity        IV Access:    Peripheral IV   Procedures and diagnostic studies:   No results found.   Medical Consultants:    None.  Anti-Infectives:   augmentin  Subjective:    Yolanda [name] she relates she feels better denies any chest pain or shortness of breath.  Objective:    Vitals:   09/19/20 1118 09/19/20 1604 09/19/20 1941 09/19/20 2000  BP:    135/87  Pulse: (!) 102 99  (!) 112  Resp: (!) 31 19  (!) 21  Temp: 97.8 F (36.6 C) 98.2 F (36.8 C)    TempSrc: Oral Oral    SpO2: 98% 96% 96% 95%  Weight:      Height:       SpO2: 95 % O2 Flow Rate (L/min): 3 L/min FiO2 (%): 36 %   Intake/Output Summary (Last 24 hours) at 09/20/2020 0726 Last data filed at 09/19/2020 1600 Gross per 24 hour  Intake --  Output 2200 ml  Net -2200 ml   Filed Weights   09/16/20 0216 09/16/20 1000 09/18/20 0406  Weight: 105 kg 107.3 kg 108.8 kg    Exam: General exam: In no acute distress. Respiratory system: Good air movement and clear to auscultation. Cardiovascular system: S1 & S2 heard,  RRR. No JVD.  Gastrointestinal system: Abdomen is nondistended, soft and nontender.  Extremities: No pedal edema. Skin: No rashes, lesions or ulcers  Data Reviewed:    Labs: Basic Metabolic Panel: Recent Labs  Lab 09/16/20 1900 09/16/20 1900 09/17/20 0600 09/17/20 0600 09/18/20 0528 09/18/20 0528 09/19/20 0420 09/20/20 0313  NA 138  --  139  --  139  --  140 140  K 2.9*   < > 4.1   < > 3.5   < > 3.5 3.7  CL 107  --  106  --  105  --  110 107  CO2 20*  --  20*  --  20*  --  21* 21*  GLUCOSE 145*  --  203*  --  171*  --  97 118*  BUN 18  --  17  --  12  --  13 13  CREATININE 0.95  --  0.89  --  0.78  --  0.73 0.63  CALCIUM 8.2*  --  8.2*  --  8.5*  --  8.1* 8.6*  MG  --   --  1.8  --   --   --   --   --    < > = values in this interval not displayed.   GFR Estimated Creatinine Clearance: 110.5 mL/min (by C-G formula based on SCr of 0.63 mg/dL). Liver Function Tests: Recent Labs  Lab 09/16/20 0203 09/17/20 0600 09/18/20 0528 09/19/20 0420 09/20/20 0313  AST 73* 925* 201* 62* 38  ALT 65* 756* 508* 275* 192*  ALKPHOS 107 98 111 131* 161*  BILITOT 0.9 1.1 1.2 0.8 0.8  PROT 7.6 6.1* 6.7 6.1* 6.4*  ALBUMIN 3.6 2.9* 2.9* 2.6* 2.7*   No results for input(s): LIPASE, AMYLASE in the last 168 hours. No results for input(s): AMMONIA in the last 168 hours. Coagulation profile Recent Labs  Lab 09/17/20 0600  INR 1.1   COVID-19 Labs  No results for input(s): DDIMER, FERRITIN, LDH, CRP in the last 72 hours.  Lab Results  Component Value Date   SARSCOV2NAA NEGATIVE 09/16/2020   SARSCOV2NAA NEGATIVE 09/16/2020   SARSCOV2NAA Detected (A) 03/27/2020    CBC: Recent Labs  Lab 09/16/20 0203 09/16/20 0203 09/17/20 0600 09/17/20 1014 09/18/20 0528 09/19/20 0420 09/20/20 0313  WBC 11.3*   < > 11.4* 9.9 8.2 8.4 7.4  NEUTROABS 4.8  --  8.6*  --   --   --   --   HGB 15.0   < > 12.5 10.7* 12.0 10.6* 11.0*  HCT 48.2*   < > 40.5 34.3* 39.2 35.6* 35.1*  MCV 88.8   < >  88.6 89.3 88.1 90.6 87.1  PLT 291   < > 145* 135* 156 133* 162   < > = values in this interval not displayed.   Cardiac Enzymes: No results for input(s): CKTOTAL, CKMB, CKMBINDEX, TROPONINI in the last 168 hours. BNP (last 3 results) No results for input(s): PROBNP in the last 8760 hours. CBG: Recent Labs  Lab 09/18/20 2054 09/19/20 0710 09/19/20 1116 09/19/20 1606 09/19/20 1958  GLUCAP 168* 81 116* 91 169*   D-Dimer: No results for input(s): DDIMER in the last 72 hours. Hgb A1c: No results for input(s): HGBA1C in the last 72 hours. Lipid Profile: No results for input(s): CHOL, HDL, LDLCALC, TRIG, CHOLHDL, LDLDIRECT in the last 72 hours. Thyroid function studies: No results for input(s): TSH, T4TOTAL, T3FREE, THYROIDAB in the last 72 hours.  Invalid input(s): FREET3 Anemia work up: No results for input(s): VITAMINB12, FOLATE, FERRITIN, TIBC, IRON, RETICCTPCT in the last 72 hours. Sepsis Labs: Recent Labs  Lab 09/16/20 0203 09/16/20 0203 09/16/20 0251 09/16/20 0507 09/16/20 0742 09/17/20 0600 09/17/20 0600 09/17/20 1014 09/17/20 1634 09/18/20 0528 09/19/20 0420 09/20/20 0313  PROCALCITON 12.79  --   --   --   --  44.09  --   --   --   --   --   --   WBC 11.3*   < >  --   --   --  11.4*   < > 9.9  --  8.2 8.4 7.4  LATICACIDVEN  --   --  4.7* 4.6* 2.8*  --   --   --  1.8  --   --   --    < > = values in this interval not displayed.   Microbiology Recent Results (from the past 240 hour(s))  Respiratory Panel by RT PCR (Flu A&B, Covid) - Nasopharyngeal Swab     Status: None   Collection Time: 09/16/20  2:12 AM   Specimen: Nasopharyngeal Swab  Result Value Ref Range Status   SARS Coronavirus 2 by RT PCR NEGATIVE NEGATIVE Final    Comment: (NOTE) SARS-CoV-2 target nucleic acids are NOT DETECTED.  The SARS-CoV-2 RNA is generally detectable in upper respiratoy specimens during the acute phase of infection. The lowest concentration of SARS-CoV-2 viral copies this  assay can detect is 131 copies/mL. A negative result does not preclude SARS-Cov-2 infection and should not be used as the sole basis for treatment or other patient management decisions. A negative result may occur with  improper specimen collection/handling, submission of specimen other than nasopharyngeal swab, presence of viral mutation(s) within the areas targeted by this assay, and inadequate number of viral copies (<131 copies/mL). A negative result must be combined with clinical observations, patient history, and epidemiological information. The expected result is Negative.  Fact Sheet for Patients:  PinkCheek.be  Fact Sheet for Healthcare Providers:  GravelBags.it  This test is no t yet approved or cleared by the [place] FDA and  has been authorized for detection and/or diagnosis of SARS-CoV-2 by FDA under an Emergency Use Authorization (EUA). This EUA will remain  in effect (meaning this test can be used) for the duration of the COVID-19 declaration under Section 564(b)(1) of the Act, 21 U.S.C. section 360bbb-3(b)(1), unless the authorization is terminated or revoked sooner.     Influenza A by PCR NEGATIVE NEGATIVE Final   Influenza B by PCR NEGATIVE NEGATIVE Final    Comment: (NOTE) The Xpert Xpress SARS-CoV-2/FLU/RSV assay is intended as an aid in  the diagnosis of influenza from Nasopharyngeal swab specimens and  should not be used as a sole basis for treatment. Nasal washings and  aspirates are unacceptable for Xpert Xpress SARS-CoV-2/FLU/RSV  testing.  Fact Sheet for Patients: PinkCheek.be  Fact Sheet for Healthcare Providers: GravelBags.it  This test is not yet approved or cleared by the [place] FDA and  has been authorized for detection and/or diagnosis of SARS-CoV-2 by  FDA under an Emergency Use Authorization (EUA). This EUA will  remain  in effect (meaning this test can be used) for the duration of the  Covid-19 declaration under Section 564(b)(1) of the Act, 21  U.S.C. section 360bbb-3(b)(1), unless the authorization is  terminated or revoked. Performed at [facility], [address], [place], [place] [address]   Urine culture     Status: Abnormal   Collection Time: 09/16/20  2:35 AM   Specimen: Urine, Catheterized  Result Value Ref Range Status   Specimen Description   Final    URINE, CATHETERIZED Performed at [facility], [address] [place], [place] [address]    Special Requests   Final    NONE Performed at [facility], [address], [place], [place] [address]    Culture >=100,000 COLONIES/mL ESCHERICHIA COLI (A)  Final   Report Status 09/18/2020 FINAL  Final   Organism ID, Bacteria ESCHERICHIA COLI (A)  Final      Susceptibility   Escherichia coli - MIC*    AMPICILLIN 4 SENSITIVE Sensitive     CEFAZOLIN <=4 SENSITIVE Sensitive     CEFTRIAXONE <=0.25 SENSITIVE Sensitive  CIPROFLOXACIN <=0.25 SENSITIVE Sensitive     GENTAMICIN <=1 SENSITIVE Sensitive     IMIPENEM <=0.25 SENSITIVE Sensitive     NITROFURANTOIN <=16 SENSITIVE Sensitive     TRIMETH/SULFA <=20 SENSITIVE Sensitive     AMPICILLIN/SULBACTAM <=2 SENSITIVE Sensitive     PIP/TAZO <=4 SENSITIVE Sensitive     * >=100,000 COLONIES/mL ESCHERICHIA COLI  Blood Culture (routine x 2)     Status: Abnormal (Preliminary result)   Collection Time: 09/16/20  2:52 AM   Specimen: Left Antecubital; Blood  Result Value Ref Range Status   Specimen Description   Final    LEFT ANTECUBITAL Performed at [facility], [address], [place], [place] [address]    Special Requests   Final    BOTTLES DRAWN AEROBIC AND ANAEROBIC Blood Culture adequate volume Performed at [facility], [address] [place], [place] [address]    Culture  Setup Time   Final    GRAM NEGATIVE RODS CRITICAL RESULT CALLED TO, READ BACK BY AND VERIFIED WITH: RN C ALSTON  [id number] AT 859 AM BY CM Performed at [facility], [place] [address] [place], [place] [address]    Culture ESCHERICHIA COLI (A)  Final   Report Status PENDING  Incomplete   Organism ID, Bacteria ESCHERICHIA COLI  Final      Susceptibility   Escherichia coli - MIC*    AMPICILLIN 4 SENSITIVE Sensitive     CEFAZOLIN <=4 SENSITIVE Sensitive     CEFEPIME <=0.12 SENSITIVE Sensitive     CEFTAZIDIME <=1 SENSITIVE Sensitive     CEFTRIAXONE <=0.25 SENSITIVE Sensitive     CIPROFLOXACIN <=0.25 SENSITIVE Sensitive     GENTAMICIN <=1 SENSITIVE Sensitive     IMIPENEM <=0.25 SENSITIVE Sensitive     TRIMETH/SULFA <=20 SENSITIVE Sensitive     AMPICILLIN/SULBACTAM <=2 SENSITIVE Sensitive     PIP/TAZO <=4 SENSITIVE Sensitive     * ESCHERICHIA COLI  Blood Culture ID Panel (Reflexed)     Status: Abnormal   Collection Time: 09/16/20  2:52 AM  Result Value Ref Range Status   Enterococcus faecalis NOT DETECTED NOT DETECTED Final   Enterococcus Faecium NOT DETECTED NOT DETECTED Final   Listeria monocytogenes NOT DETECTED NOT DETECTED Final   Staphylococcus species NOT DETECTED NOT DETECTED Final   Staphylococcus aureus (BCID) NOT DETECTED NOT DETECTED Final   Staphylococcus epidermidis NOT DETECTED NOT DETECTED Final   Staphylococcus lugdunensis NOT DETECTED NOT DETECTED Final   Streptococcus species NOT DETECTED NOT DETECTED Final   Streptococcus agalactiae NOT DETECTED NOT DETECTED Final   Streptococcus pneumoniae NOT DETECTED NOT DETECTED Final   Streptococcus pyogenes NOT DETECTED NOT DETECTED Final   A.calcoaceticus-baumannii NOT DETECTED NOT DETECTED Final   Bacteroides fragilis NOT DETECTED NOT DETECTED Final   Enterobacterales DETECTED (A) NOT DETECTED Final    Comment: Enterobacterales represent a large order of gram negative bacteria, not a single organism. CRITICAL RESULT CALLED TO, READ BACK BY AND VERIFIED WITH: RN C ALSTON [id number] AT 900 AM BY CM    Enterobacter cloacae complex NOT  DETECTED NOT DETECTED Final   Escherichia coli DETECTED (A) NOT DETECTED Final    Comment: CRITICAL RESULT CALLED TO, READ BACK BY AND VERIFIED WITH: RN C ALSTON [id number] AT 900 AM BY CM    Klebsiella aerogenes NOT DETECTED NOT DETECTED Final   Klebsiella oxytoca NOT DETECTED NOT DETECTED Final   Klebsiella pneumoniae NOT DETECTED NOT DETECTED Final   Proteus species NOT DETECTED NOT DETECTED Final   Salmonella  species NOT DETECTED NOT DETECTED Final   Serratia marcescens NOT DETECTED NOT DETECTED Final   Haemophilus influenzae NOT DETECTED NOT DETECTED Final   Neisseria meningitidis NOT DETECTED NOT DETECTED Final   Pseudomonas aeruginosa NOT DETECTED NOT DETECTED Final   Stenotrophomonas maltophilia NOT DETECTED NOT DETECTED Final   Candida albicans NOT DETECTED NOT DETECTED Final   Candida auris NOT DETECTED NOT DETECTED Final   Candida glabrata NOT DETECTED NOT DETECTED Final   Candida krusei NOT DETECTED NOT DETECTED Final   Candida parapsilosis NOT DETECTED NOT DETECTED Final   Candida tropicalis NOT DETECTED NOT DETECTED Final   Cryptococcus neoformans/gattii NOT DETECTED NOT DETECTED Final   CTX-M ESBL NOT DETECTED NOT DETECTED Final   Carbapenem resistance IMP NOT DETECTED NOT DETECTED Final   Carbapenem resistance KPC NOT DETECTED NOT DETECTED Final   Carbapenem resistance NDM NOT DETECTED NOT DETECTED Final   Carbapenem resist OXA 48 LIKE NOT DETECTED NOT DETECTED Final   Carbapenem resistance VIM NOT DETECTED NOT DETECTED Final    Comment: Performed at [facility], [address] [place], [place] [address]  Blood Culture (routine x 2)     Status: None (Preliminary result)   Collection Time: 09/16/20  3:09 AM   Specimen: BLOOD LEFT HAND  Result Value Ref Range Status   Specimen Description BLOOD LEFT HAND  Final   Special Requests   Final    BOTTLES DRAWN AEROBIC AND ANAEROBIC Blood Culture adequate volume   Culture   Final    NO GROWTH 3 DAYS Performed at [facility], [address] [place], [place] [address]    Report Status PENDING  Incomplete  Respiratory Panel by RT PCR (Flu A&B, Covid) - Nasopharyngeal Swab     Status: None   Collection Time: 09/16/20  6:03 AM   Specimen: Nasopharyngeal Swab  Result Value Ref Range Status   SARS Coronavirus 2 by RT PCR NEGATIVE NEGATIVE Final    Comment: (NOTE) SARS-CoV-2 target nucleic acids are NOT DETECTED.  The SARS-CoV-2 RNA is generally detectable in upper respiratoy specimens during the acute phase of infection. The lowest concentration of SARS-CoV-2 viral copies this assay can detect is 131 copies/mL. A negative result does not preclude SARS-Cov-2 infection and should not be used as the sole basis for treatment or other patient management decisions. A negative result may occur with  improper specimen collection/handling, submission of specimen other than nasopharyngeal swab, presence of viral mutation(s) within the areas targeted by this assay, and inadequate number of viral copies (<131 copies/mL). A negative result must be combined with clinical observations, patient history, and epidemiological information. The expected result is Negative.  Fact Sheet for Patients:  PinkCheek.be  Fact Sheet for Healthcare Providers:  GravelBags.it  This test is no t yet approved or cleared by the [place] FDA and  has been authorized for detection and/or diagnosis of SARS-CoV-2 by FDA under an Emergency Use Authorization (EUA). This EUA will remain  in effect (meaning this test can be used) for the duration of the COVID-19 declaration under Section 564(b)(1) of the Act, 21 U.S.C. section 360bbb-3(b)(1), unless the authorization is terminated or revoked sooner.     Influenza A by PCR NEGATIVE NEGATIVE Final   Influenza B by PCR NEGATIVE NEGATIVE Final    Comment: (NOTE) The Xpert Xpress SARS-CoV-2/FLU/RSV assay is intended as an aid in    the diagnosis of influenza from Nasopharyngeal swab specimens and  should not be used as a sole  basis for treatment. Nasal washings and  aspirates are unacceptable for Xpert Xpress SARS-CoV-2/FLU/RSV  testing.  Fact Sheet for Patients: PinkCheek.be  Fact Sheet for Healthcare Providers: GravelBags.it  This test is not yet approved or cleared by the [place] FDA and  has been authorized for detection and/or diagnosis of SARS-CoV-2 by  FDA under an Emergency Use Authorization (EUA). This EUA will remain  in effect (meaning this test can be used) for the duration of the  Covid-19 declaration under Section 564(b)(1) of the Act, 21  U.S.C. section 360bbb-3(b)(1), unless the authorization is  terminated or revoked. Performed at [facility], [address], [place], [place] [address]   MRSA PCR Screening     Status: None   Collection Time: 09/16/20  9:45 AM   Specimen: Nasal Mucosa; Nasopharyngeal  Result Value Ref Range Status   MRSA by PCR NEGATIVE NEGATIVE Final    Comment:        The GeneXpert MRSA Assay (FDA approved for NASAL specimens only), is one component of a comprehensive MRSA colonization surveillance program. It is not intended to diagnose MRSA infection nor to guide or monitor treatment for MRSA infections. Performed at [facility], [address] [place], [place] [address]   C Difficile Quick Screen w PCR reflex     Status: None   Collection Time: 09/17/20  3:54 PM   Specimen: STOOL  Result Value Ref Range Status   C Diff antigen NEGATIVE NEGATIVE Final   C Diff toxin NEGATIVE NEGATIVE Final   C Diff interpretation No C. difficile detected.  Final    Comment: Performed at [facility], [address] [place], [place] [address]     Medications:   . albuterol  2.5 mg Nebulization TID  . amoxicillin-clavulanate  1 tablet Oral Q12H  . aspirin  81 mg Oral Pre-Cath  . aspirin EC  81 mg Oral Daily   . azithromycin  500 mg Oral Daily  . busPIRone  15 mg Oral BID  . Chlorhexidine Gluconate Cloth  6 each Topical Daily  . insulin aspart  0-20 Units Subcutaneous TID WC  . insulin aspart  0-5 Units Subcutaneous QHS  . insulin glargine  70 Units Subcutaneous Q24H  . losartan  12.5 mg Oral Daily  . metoprolol succinate  12.5 mg Oral Daily  . mirabegron ER  50 mg Oral Daily  . pantoprazole  40 mg Oral Daily  . PARoxetine  60 mg Oral Daily  . pregabalin  300 mg Oral Q12H  . QUEtiapine  100 mg Oral QHS  . sodium chloride flush  3 mL Intravenous Q12H   Continuous Infusions: . sodium chloride    . heparin 2,500 Units/hr (09/20/20 0436)  . insulin Stopped (09/16/20 2000)      LOS: 4 days   Charlynne Cousins  [facility]  09/20/2020, 7:26 AM

## 2020-09-20 NOTE — Progress Notes (Signed)
Patient refused use of CPAP for the evening 

## 2020-09-20 NOTE — Interval H&P Note (Signed)
History and Physical Interval Note:  09/20/2020 10:03 AM  Yolanda [name]  has presented today for surgery, with the diagnosis of nstemi.  The various methods of treatment have been discussed with the patient and family. After consideration of risks, benefits and other options for treatment, the patient has consented to  Procedure(s): RIGHT/LEFT HEART CATH AND CORONARY ANGIOGRAPHY (N/A) as a surgical intervention.  The patient's history has been reviewed, patient examined, no change in status, stable for surgery.  I have reviewed the patient's chart and labs.  Questions were answered to the patient's satisfaction.     Sherren Mocha

## 2020-09-21 DIAGNOSIS — I5181 Takotsubo syndrome: Secondary | ICD-10-CM

## 2020-09-21 DIAGNOSIS — E1165 Type 2 diabetes mellitus with hyperglycemia: Secondary | ICD-10-CM

## 2020-09-21 DIAGNOSIS — I10 Essential (primary) hypertension: Secondary | ICD-10-CM

## 2020-09-21 LAB — COMPREHENSIVE METABOLIC PANEL
ALT: 160 U/L — ABNORMAL HIGH (ref 0–44)
AST: 36 U/L (ref 15–41)
Albumin: 2.8 g/dL — ABNORMAL LOW (ref 3.5–5.0)
Alkaline Phosphatase: 171 U/L — ABNORMAL HIGH (ref 38–126)
Anion gap: 11 (ref 5–15)
BUN: 10 mg/dL (ref 6–20)
CO2: 23 mmol/L (ref 22–32)
Calcium: 9.2 mg/dL (ref 8.9–10.3)
Chloride: 106 mmol/L (ref 98–111)
Creatinine, Ser: 0.75 mg/dL (ref 0.44–1.00)
GFR, Estimated: >60 mL/min (ref >60)
Glucose, Bld: 99 mg/dL (ref 70–99)
Potassium: 3.8 mmol/L (ref 3.5–5.1)
Sodium: 140 mmol/L (ref 135–145)
Total Bilirubin: 0.9 mg/dL (ref 0.3–1.2)
Total Protein: 6.4 g/dL — ABNORMAL LOW (ref 6.5–8.1)

## 2020-09-21 LAB — GLUCOSE, CAPILLARY
Glucose-Capillary: 94 mg/dL (ref 70–99)
Glucose-Capillary: 131 mg/dL — ABNORMAL HIGH (ref 70–99)
Glucose-Capillary: 104 mg/dL — ABNORMAL HIGH (ref 70–99)
Glucose-Capillary: 155 mg/dL — ABNORMAL HIGH (ref 70–99)

## 2020-09-21 LAB — CBC
HCT: 38.9 % (ref 36.0–46.0)
Hemoglobin: 12.1 g/dL (ref 12.0–15.0)
MCH: 27.1 pg (ref 26.0–34.0)
MCHC: 31.1 g/dL (ref 30.0–36.0)
MCV: 87 fL (ref 80.0–100.0)
Platelets: 219 10*3/uL (ref 150–400)
RBC: 4.47 MIL/uL (ref 3.87–5.11)
RDW: 15.7 % — ABNORMAL HIGH (ref 11.5–15.5)
WBC: 9.4 10*3/uL (ref 4.0–10.5)
nRBC: 0.5 % — ABNORMAL HIGH (ref 0.0–0.2)

## 2020-09-21 LAB — LIPID PANEL
Cholesterol: 104 mg/dL (ref 0–200)
HDL: 18 mg/dL — ABNORMAL LOW (ref >40)
LDL Cholesterol: 52 mg/dL (ref 0–99)
Total CHOL/HDL Ratio: 5.8 RATIO
Triglycerides: 170 mg/dL — ABNORMAL HIGH (ref <150)
VLDL: 34 mg/dL (ref 0–40)

## 2020-09-21 LAB — CULTURE, BLOOD (ROUTINE X 2)
Culture: NO GROWTH
Special Requests: ADEQUATE

## 2020-09-21 MED ORDER — METOPROLOL SUCCINATE ER 25 MG PO TB24
25.0000 mg | ORAL_TABLET | Freq: Every day | ORAL | Status: DC
  Administered 2020-09-21 – 2020-09-22 (×2): 25 mg via ORAL
  Filled 2020-09-21 (×2): qty 1

## 2020-09-21 MED ORDER — SPIRONOLACTONE 12.5 MG HALF TABLET
12.5000 mg | ORAL_TABLET | Freq: Every day | ORAL | Status: DC
  Administered 2020-09-21 – 2020-09-22 (×2): 12.5 mg via ORAL
  Filled 2020-09-21 (×2): qty 1

## 2020-09-21 MED ORDER — PANTOPRAZOLE SODIUM 40 MG PO TBEC
40.0000 mg | DELAYED_RELEASE_TABLET | Freq: Every day | ORAL | Status: DC
Start: 2020-09-21 — End: 2020-09-21

## 2020-09-21 MED ORDER — FLUCONAZOLE 100 MG PO TABS
100.0000 mg | ORAL_TABLET | Freq: Every day | ORAL | Status: DC
  Administered 2020-09-22: 100 mg via ORAL
  Filled 2020-09-21: qty 1

## 2020-09-21 MED ORDER — LOSARTAN POTASSIUM 25 MG PO TABS: 25.0000 mg | ORAL_TABLET | Freq: Every day | ORAL | Status: DC

## 2020-09-21 MED ORDER — FLUCONAZOLE IN SODIUM CHLORIDE 200-0.9 MG/100ML-% IV SOLN
200.0000 mg | Freq: Once | INTRAVENOUS | Status: AC
  Administered 2020-09-21: 200 mg via INTRAVENOUS
  Filled 2020-09-21: qty 100

## 2020-09-21 MED ORDER — AMOXICILLIN-POT CLAVULANATE 875-125 MG PO TABS
1.0000 | ORAL_TABLET | Freq: Two times a day (BID) | ORAL | Status: DC
  Administered 2020-09-21 – 2020-09-22 (×2): 1 via ORAL
  Filled 2020-09-21 (×2): qty 1

## 2020-09-21 MED ORDER — FAMOTIDINE 20 MG PO TABS
20.0000 mg | ORAL_TABLET | Freq: Two times a day (BID) | ORAL | Status: DC | PRN
  Administered 2020-09-21: 20 mg via ORAL
  Filled 2020-09-21: qty 1

## 2020-09-21 MED ORDER — LOSARTAN POTASSIUM 25 MG PO TABS
12.5000 mg | ORAL_TABLET | Freq: Every day | ORAL | Status: DC
  Administered 2020-09-22: 12.5 mg via ORAL
  Filled 2020-09-21: qty 1

## 2020-09-21 NOTE — Progress Notes (Addendum)
[facility] PROGRESS NOTE    Progress Note  Yolanda [name]  EKC:[id number] DOB: [birth year] DOA: 09/16/2020 PCP: Lucia Gaskins, MD     Brief Narrative:   Yolanda [name] is an 49 y.o. female past medical history of diabetes mellitus type 2, panic disorder, essential hypertension psychiatric disorder comes into the ED with chief complains of I was freezing when she came into the ED she was saturating 40% on room air placed on a nonrebreather.  She has not received her Covid vaccine, she does relate missing several doses of her insulin.  Assessment/Plan:   Severe sepsis due to E. coli: Likely due to UTI as urine culture was positive for E. coli After fluid resuscitation and IV empiric antibiotics sepsis physiology resolved. 1 out of 2 blood cultures for E. Coli. She is currently on amoxicillin and azithromycin, will discontinue azithromycin at fifth dose. Blood pressure and vital signs are stable, will continue empiric antibiotic for 2 additional days  Acute respiratory failure with hypoxia possibly due to pneumonia: CT angio of the chest negative but it did show bilateral infiltrates Covid test is negative x2. She has completed her course of antibiotics in house.  Elevated troponin question Takotsubo cardiomyopathy: 2D echo done on 09/16/2020 showed severe hypokinesia of the mid to distal anterior wall, septal and anterior, ultimately affects appear akinetic with an EF of 25% and mild left concentric hypertrophy. Keep the patient n.p.o. she is scheduled for cardiac cath on 09/20/2020. Cardiac cath was done that showed nonobstructive coronary artery disease cardiology recommended to continue aspirin, statins, beta-blocker and ARB. Continue to titrate beta-blocker as tolerated her heart rate is in the 90s. We will keep an eye on the pulse to see if beta-blocker can be titrated.  Acute systolic heart failure: With a new drop in EF by 2D echo to 25% Cardiology was consulted  and concern about Takotsubo versus LAD disease. Continue to titrate beta-blocker and ACE inhibitor as tolerated.  DKA type II: She was started on IV insulin and IV fluids her DKA resolved. He has been transitioned to long-acting insulin plus sliding scale. Currently on a diet blood glucose he seems to be well-controlled continue current regimen.  Acute kidney injury: With a baseline creatinine of 0.9 on admission 1.3 resolved with IV fluid hydration.  Elevated LFTs: These are trending down likely reactive, continue statins.  Essential hypertension: Blood pressure seems to be fairly controlled, continue to titrate Toprol and losartan as tolerated.  Diarrhea: Likely due to antibiotics, C. difficile negative.  Vaginal discharge: Started on Diflucan likely Candida infection, she is not diabetic which was been on antibiotics.  Right total stage II pressure ulcer on admission RN Pressure Injury Documentation: Pressure Injury 09/16/20 Toe (Comment  which one) Anterior;Right Unstageable - Full thickness tissue loss in which the base of the injury is covered by slough (yellow, tan, gray, green or brown) and/or eschar (tan, brown or black) in the wound bed. yello (Active)  09/16/20 0237  Location: Toe (Comment  which one)  Location Orientation: Anterior;Right  Staging: Unstageable - Full thickness tissue loss in which the base of the injury is covered by slough (yellow, tan, gray, green or brown) and/or eschar (tan, brown or black) in the wound bed.  Wound Description (Comments): yello sloth persent.  Present on Admission: Yes     Pressure Injury 09/16/20 Toe (Comment  which one) Anterior;Left Unstageable - Full thickness tissue loss in which the base of the injury is covered by slough (yellow, tan,  gray, green or brown) and/or eschar (tan, brown or black) in the wound bed. unable (Active)  09/16/20 0237  Location: Toe (Comment  which one)  Location Orientation: Anterior;Left  Staging:  Unstageable - Full thickness tissue loss in which the base of the injury is covered by slough (yellow, tan, gray, green or brown) and/or eschar (tan, brown or black) in the wound bed.  Wound Description (Comments): unable to stage due to scab in place  Present on Admission: Yes    Estimated body mass index is 37.57 kg/m as calculated from the following:   Height as of this encounter: 5' 7"  (1.702 m).   Weight as of this encounter: 108.8 kg.   DVT prophylaxis: lovenxo Family Communication:none Status is: Inpatient  Remains inpatient appropriate because:Hemodynamically unstable   Dispo: The patient is from: Home              Anticipated d/c is to: Home              Anticipated d/c date is: 1 day              Patient currently is not medically stable to d/c.        Code Status:     Code Status Orders  (From admission, onward)         Start     Ordered   09/16/20 0550  Full code  Continuous        09/16/20 0549        Code Status History    This patient has a current code status but no historical code status.   Advance Care Planning Activity        IV Access:    Peripheral IV   Procedures and diagnostic studies:   CARDIAC CATHETERIZATION  Result Date: 09/20/2020 1.  Moderate coronary artery disease involving the distal RCA 2.  Mild nonobstructive disease involving the LAD 3.  Patent left main and left circumflex with no significant stenosis 4.  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  Should have repeat echocardiogram within 3 months to assess for LV recovery.    Medical Consultants:    None.  Anti-Infectives:   augmentin  Subjective:    Yolanda [name] feels great no chest pain or shortness of breath tolerating her diet.  Objective:    Vitals:    09/20/20 1523 09/20/20 1600 09/20/20 1954 09/21/20 0017  BP: (!) 137/95  (!) 152/97 128/85  Pulse: (!) 110 94 98 98  Resp:   17 16  Temp:   98.5 F (36.9 C) 97.6 F (36.4 C)  TempSrc:   Oral Oral  SpO2: 97% 95% 94% 90%  Weight:      Height:       SpO2: 90 % O2 Flow Rate (L/min): 2 L/min FiO2 (%): 36 %   Intake/Output Summary (Last 24 hours) at 09/21/2020 1047 Last data filed at 09/21/2020 0917 Gross per 24 hour  Intake 720 ml  Output 450 ml  Net 270 ml   Filed Weights   09/16/20 0216 09/16/20 1000 09/18/20 0406  Weight: 105 kg 107.3 kg 108.8 kg    Exam: General exam: In no acute distress. Respiratory system: Good air movement and clear to auscultation. Cardiovascular system: S1 & S2 heard, RRR. No JVD. Gastrointestinal system: Abdomen is nondistended, soft and nontender.  Extremities: No  pedal edema. Skin: No rashes, lesions or ulcers Psychiatry: Judgement and insight appear normal. Mood & affect appropriate.  Data Reviewed:    Labs: Basic Metabolic Panel: Recent Labs  Lab 09/17/20 0600 09/17/20 0600 09/18/20 0528 09/18/20 0528 09/19/20 0420 09/19/20 0420 09/20/20 0313 09/20/20 0313 09/20/20 1049 09/20/20 1049 09/20/20 1050 09/20/20 1606 09/21/20 0134  NA 139   < > 139   < > 140  --  140  --  141  --  141  --  140  K 4.1   < > 3.5   < > 3.5   < > 3.7   < > 3.9   < > 3.9  --  3.8  CL 106  --  105  --  110  --  107  --   --   --   --   --  106  CO2 20*  --  20*  --  21*  --  21*  --   --   --   --   --  23  GLUCOSE 203*  --  171*  --  97  --  118*  --   --   --   --   --  99  BUN 17  --  12  --  13  --  13  --   --   --   --   --  10  CREATININE 0.89   < > 0.78  --  0.73  --  0.63  --   --   --   --  0.66 0.75  CALCIUM 8.2*  --  8.5*  --  8.1*  --  8.6*  --   --   --   --   --  9.2  MG 1.8  --   --   --   --   --   --   --   --   --   --   --   --    < > = values in this interval not displayed.   GFR Estimated Creatinine Clearance: 110.5 mL/min (by  C-G formula based on SCr of 0.75 mg/dL). Liver Function Tests: Recent Labs  Lab 09/17/20 0600 09/18/20 0528 09/19/20 0420 09/20/20 0313 09/21/20 0134  AST 925* 201* 62* 38 36  ALT 756* 508* 275* 192* 160*  ALKPHOS 98 111 131* 161* 171*  BILITOT 1.1 1.2 0.8 0.8 0.9  PROT 6.1* 6.7 6.1* 6.4* 6.4*  ALBUMIN 2.9* 2.9* 2.6* 2.7* 2.8*   No results for input(s): LIPASE, AMYLASE in the last 168 hours. No results for input(s): AMMONIA in the last 168 hours. Coagulation profile Recent Labs  Lab 09/17/20 0600  INR 1.1   COVID-19 Labs  No results for input(s): DDIMER, FERRITIN, LDH, CRP in the last 72 hours.  Lab Results  Component Value Date   SARSCOV2NAA NEGATIVE 09/16/2020   SARSCOV2NAA NEGATIVE 09/16/2020   SARSCOV2NAA Detected (A) 03/27/2020    CBC: Recent Labs  Lab 09/16/20 0203 09/16/20 0203 09/17/20 0600 09/17/20 1014 09/18/20 0528 09/18/20 0528 09/19/20 0420 09/19/20 0420 09/20/20 0313 09/20/20 1049 09/20/20 1050 09/20/20 1606 09/21/20 0134  WBC 11.3*   < > 11.4*   < > 8.2  --  8.4  --  7.4  --   --  7.5 9.4  NEUTROABS 4.8  --  8.6*  --   --   --   --   --   --   --   --   --   --  HGB 15.0   < > 12.5   < > 12.0   < > 10.6*   < > 11.0* 10.5* 10.5* 10.8* 12.1  HCT 48.2*   < > 40.5   < > 39.2   < > 35.6*   < > 35.1* 31.0* 31.0* 34.5* 38.9  MCV 88.8   < > 88.6   < > 88.1  --  90.6  --  87.1  --   --  86.0 87.0  PLT 291   < > 145*   < > 156  --  133*  --  162  --   --  184 219   < > = values in this interval not displayed.   Cardiac Enzymes: No results for input(s): CKTOTAL, CKMB, CKMBINDEX, TROPONINI in the last 168 hours. BNP (last 3 results) No results for input(s): PROBNP in the last 8760 hours. CBG: Recent Labs  Lab 09/20/20 0751 09/20/20 1126 09/20/20 1625 09/20/20 2053 09/21/20 0807  GLUCAP 114* 108* 102* 120* 94   D-Dimer: No results for input(s): DDIMER in the last 72 hours. Hgb A1c: No results for input(s): HGBA1C in the last 72  hours. Lipid Profile: Recent Labs    09/21/20 0134  CHOL 104  HDL 18*  LDLCALC 52  TRIG 170*  CHOLHDL 5.8   Thyroid function studies: No results for input(s): TSH, T4TOTAL, T3FREE, THYROIDAB in the last 72 hours.  Invalid input(s): FREET3 Anemia work up: No results for input(s): VITAMINB12, FOLATE, FERRITIN, TIBC, IRON, RETICCTPCT in the last 72 hours. Sepsis Labs: Recent Labs  Lab 09/16/20 0203 09/16/20 0203 09/16/20 0251 09/16/20 0507 09/16/20 0742 09/17/20 0600 09/17/20 1014 09/17/20 1634 09/18/20 0528 09/19/20 0420 09/20/20 0313 09/20/20 1606 09/21/20 0134  PROCALCITON 12.79  --   --   --   --  44.09  --   --   --   --   --   --   --   WBC 11.3*   < >  --   --   --  11.4*   < >  --    < > 8.4 7.4 7.5 9.4  LATICACIDVEN  --   --  4.7* 4.6* 2.8*  --   --  1.8  --   --   --   --   --    < > = values in this interval not displayed.   Microbiology Recent Results (from the past 240 hour(s))  Respiratory Panel by RT PCR (Flu A&B, Covid) - Nasopharyngeal Swab     Status: None   Collection Time: 09/16/20  2:12 AM   Specimen: Nasopharyngeal Swab  Result Value Ref Range Status   SARS Coronavirus 2 by RT PCR NEGATIVE NEGATIVE Final    Comment: (NOTE) SARS-CoV-2 target nucleic acids are NOT DETECTED.  The SARS-CoV-2 RNA is generally detectable in upper respiratoy specimens during the acute phase of infection. The lowest concentration of SARS-CoV-2 viral copies this assay can detect is 131 copies/mL. A negative result does not preclude SARS-Cov-2 infection and should not be used as the sole basis for treatment or other patient management decisions. A negative result may occur with  improper specimen collection/handling, submission of specimen other than nasopharyngeal swab, presence of viral mutation(s) within the areas targeted by this assay, and inadequate number of viral copies (<131 copies/mL). A negative result must be combined with clinical observations, patient  history, and epidemiological information. The expected result is Negative.  Fact Sheet for Patients:  PinkCheek.be  Fact  Sheet for Healthcare Providers:  GravelBags.it  This test is no t yet approved or cleared by the [place] FDA and  has been authorized for detection and/or diagnosis of SARS-CoV-2 by FDA under an Emergency Use Authorization (EUA). This EUA will remain  in effect (meaning this test can be used) for the duration of the COVID-19 declaration under Section 564(b)(1) of the Act, 21 U.S.C. section 360bbb-3(b)(1), unless the authorization is terminated or revoked sooner.     Influenza A by PCR NEGATIVE NEGATIVE Final   Influenza B by PCR NEGATIVE NEGATIVE Final    Comment: (NOTE) The Xpert Xpress SARS-CoV-2/FLU/RSV assay is intended as an aid in  the diagnosis of influenza from Nasopharyngeal swab specimens and  should not be used as a sole basis for treatment. Nasal washings and  aspirates are unacceptable for Xpert Xpress SARS-CoV-2/FLU/RSV  testing.  Fact Sheet for Patients: PinkCheek.be  Fact Sheet for Healthcare Providers: GravelBags.it  This test is not yet approved or cleared by the [place] FDA and  has been authorized for detection and/or diagnosis of SARS-CoV-2 by  FDA under an Emergency Use Authorization (EUA). This EUA will remain  in effect (meaning this test can be used) for the duration of the  Covid-19 declaration under Section 564(b)(1) of the Act, 21  U.S.C. section 360bbb-3(b)(1), unless the authorization is  terminated or revoked. Performed at [facility], [address] [place], [place] [address]   Urine culture     Status: Abnormal   Collection Time: 09/16/20  2:35 AM   Specimen: Urine, Catheterized  Result Value Ref Range Status   Specimen Description   Final    URINE, CATHETERIZED Performed at [facility], [address] [place], [place] [address]    Special Requests   Final    NONE Performed at [facility], [address], [place], [place] [address]    Culture >=100,000 COLONIES/mL ESCHERICHIA COLI (A)  Final   Report Status 09/18/2020 FINAL  Final   Organism ID, Bacteria ESCHERICHIA COLI (A)  Final      Susceptibility   Escherichia coli - MIC*    AMPICILLIN 4 SENSITIVE Sensitive     CEFAZOLIN <=4 SENSITIVE Sensitive     CEFTRIAXONE <=0.25 SENSITIVE Sensitive     CIPROFLOXACIN <=0.25 SENSITIVE Sensitive     GENTAMICIN <=1 SENSITIVE Sensitive     IMIPENEM <=0.25 SENSITIVE Sensitive     NITROFURANTOIN <=16 SENSITIVE Sensitive     TRIMETH/SULFA <=20 SENSITIVE Sensitive     AMPICILLIN/SULBACTAM <=2 SENSITIVE Sensitive     PIP/TAZO <=4 SENSITIVE Sensitive     * >=100,000 COLONIES/mL ESCHERICHIA COLI  Blood Culture (routine x 2)     Status: Abnormal   Collection Time: 09/16/20  2:52 AM   Specimen: Left Antecubital; Blood  Result Value Ref Range Status   Specimen Description   Final    LEFT ANTECUBITAL Performed at [facility], [address], [place], [place] [address]    Special Requests   Final    BOTTLES DRAWN AEROBIC AND ANAEROBIC Blood Culture adequate volume Performed at [facility], [address] [place], [place] [address]    Culture  Setup Time   Final    GRAM NEGATIVE RODS CRITICAL RESULT CALLED TO, READ BACK BY AND VERIFIED WITH: RN C ALSTON [id number] AT 859 AM BY CM Performed at [facility], [place] [address], [place], [place] [address]    Culture ESCHERICHIA COLI (A)  Final   Report Status 09/20/2020 FINAL  Final   Organism ID, Bacteria ESCHERICHIA COLI  Final      Susceptibility   Escherichia coli - MIC*    AMPICILLIN 4 SENSITIVE Sensitive     CEFAZOLIN <=4 SENSITIVE Sensitive     CEFEPIME <=0.12 SENSITIVE Sensitive     CEFTAZIDIME <=1 SENSITIVE Sensitive     CEFTRIAXONE <=0.25 SENSITIVE Sensitive     CIPROFLOXACIN <=0.25 SENSITIVE Sensitive      GENTAMICIN <=1 SENSITIVE Sensitive     IMIPENEM <=0.25 SENSITIVE Sensitive     TRIMETH/SULFA <=20 SENSITIVE Sensitive     AMPICILLIN/SULBACTAM <=2 SENSITIVE Sensitive     PIP/TAZO <=4 SENSITIVE Sensitive     * ESCHERICHIA COLI  Blood Culture ID Panel (Reflexed)     Status: Abnormal   Collection Time: 09/16/20  2:52 AM  Result Value Ref Range Status   Enterococcus faecalis NOT DETECTED NOT DETECTED Final   Enterococcus Faecium NOT DETECTED NOT DETECTED Final   Listeria monocytogenes NOT DETECTED NOT DETECTED Final   Staphylococcus species NOT DETECTED NOT DETECTED Final   Staphylococcus aureus (BCID) NOT DETECTED NOT DETECTED Final   Staphylococcus epidermidis NOT DETECTED NOT DETECTED Final   Staphylococcus lugdunensis NOT DETECTED NOT DETECTED Final   Streptococcus species NOT DETECTED NOT DETECTED Final   Streptococcus agalactiae NOT DETECTED NOT DETECTED Final   Streptococcus pneumoniae NOT DETECTED NOT DETECTED Final   Streptococcus pyogenes NOT DETECTED NOT DETECTED Final   A.calcoaceticus-baumannii NOT DETECTED NOT DETECTED Final   Bacteroides fragilis NOT DETECTED NOT DETECTED Final   Enterobacterales DETECTED (A) NOT DETECTED Final    Comment: Enterobacterales represent a large order of gram negative bacteria, not a single organism. CRITICAL RESULT CALLED TO, READ BACK BY AND VERIFIED WITH: RN C ALSTON [id number] AT 900 AM BY CM    Enterobacter cloacae complex NOT DETECTED NOT DETECTED Final   Escherichia coli DETECTED (A) NOT DETECTED Final    Comment: CRITICAL RESULT CALLED TO, READ BACK BY AND VERIFIED WITH: RN C ALSTON [id number] AT 900 AM BY CM    Klebsiella aerogenes NOT DETECTED NOT DETECTED Final   Klebsiella oxytoca NOT DETECTED NOT DETECTED Final   Klebsiella pneumoniae NOT DETECTED NOT DETECTED Final   Proteus species NOT DETECTED NOT DETECTED Final   Salmonella species NOT DETECTED NOT DETECTED Final   Serratia marcescens NOT DETECTED NOT DETECTED Final   Haemophilus  influenzae NOT DETECTED NOT DETECTED Final   Neisseria meningitidis NOT DETECTED NOT DETECTED Final   Pseudomonas aeruginosa NOT DETECTED NOT DETECTED Final   Stenotrophomonas maltophilia NOT DETECTED NOT DETECTED Final   Candida albicans NOT DETECTED NOT DETECTED Final   Candida auris NOT DETECTED NOT DETECTED Final   Candida glabrata NOT DETECTED NOT DETECTED Final   Candida krusei NOT DETECTED NOT DETECTED Final   Candida parapsilosis NOT DETECTED NOT DETECTED Final   Candida tropicalis NOT DETECTED NOT DETECTED Final   Cryptococcus neoformans/gattii NOT DETECTED NOT DETECTED Final   CTX-M ESBL NOT DETECTED NOT DETECTED Final   Carbapenem resistance IMP NOT DETECTED NOT DETECTED Final   Carbapenem resistance KPC NOT DETECTED NOT DETECTED Final   Carbapenem resistance NDM NOT DETECTED NOT DETECTED Final   Carbapenem resist OXA 48 LIKE NOT DETECTED NOT DETECTED Final   Carbapenem resistance VIM NOT DETECTED NOT DETECTED Final    Comment: Performed at [facility], [address], [place], [place] [address]  Blood Culture (routine x 2)     Status: None   Collection Time: 09/16/20  3:09 AM  Specimen: BLOOD LEFT HAND  Result Value Ref Range Status   Specimen Description BLOOD LEFT HAND  Final   Special Requests   Final    BOTTLES DRAWN AEROBIC AND ANAEROBIC Blood Culture adequate volume   Culture   Final    NO GROWTH 5 DAYS Performed at [facility], [address] [place], [place] [address]    Report Status 09/21/2020 FINAL  Final  Respiratory Panel by RT PCR (Flu A&B, Covid) - Nasopharyngeal Swab     Status: None   Collection Time: 09/16/20  6:03 AM   Specimen: Nasopharyngeal Swab  Result Value Ref Range Status   SARS Coronavirus 2 by RT PCR NEGATIVE NEGATIVE Final    Comment: (NOTE) SARS-CoV-2 target nucleic acids are NOT DETECTED.  The SARS-CoV-2 RNA is generally detectable in upper respiratoy specimens during the acute phase of infection. The lowest concentration of  SARS-CoV-2 viral copies this assay can detect is 131 copies/mL. A negative result does not preclude SARS-Cov-2 infection and should not be used as the sole basis for treatment or other patient management decisions. A negative result may occur with  improper specimen collection/handling, submission of specimen other than nasopharyngeal swab, presence of viral mutation(s) within the areas targeted by this assay, and inadequate number of viral copies (<131 copies/mL). A negative result must be combined with clinical observations, patient history, and epidemiological information. The expected result is Negative.  Fact Sheet for Patients:  PinkCheek.be  Fact Sheet for Healthcare Providers:  GravelBags.it  This test is no t yet approved or cleared by the [place] FDA and  has been authorized for detection and/or diagnosis of SARS-CoV-2 by FDA under an Emergency Use Authorization (EUA). This EUA will remain  in effect (meaning this test can be used) for the duration of the COVID-19 declaration under Section 564(b)(1) of the Act, 21 U.S.C. section 360bbb-3(b)(1), unless the authorization is terminated or revoked sooner.     Influenza A by PCR NEGATIVE NEGATIVE Final   Influenza B by PCR NEGATIVE NEGATIVE Final    Comment: (NOTE) The Xpert Xpress SARS-CoV-2/FLU/RSV assay is intended as an aid in  the diagnosis of influenza from Nasopharyngeal swab specimens and  should not be used as a sole basis for treatment. Nasal washings and  aspirates are unacceptable for Xpert Xpress SARS-CoV-2/FLU/RSV  testing.  Fact Sheet for Patients: PinkCheek.be  Fact Sheet for Healthcare Providers: GravelBags.it  This test is not yet approved or cleared by the [place] FDA and  has been authorized for detection and/or diagnosis of SARS-CoV-2 by  FDA under an Emergency Use  Authorization (EUA). This EUA will remain  in effect (meaning this test can be used) for the duration of the  Covid-19 declaration under Section 564(b)(1) of the Act, 21  U.S.C. section 360bbb-3(b)(1), unless the authorization is  terminated or revoked. Performed at [facility], [address], [place], [place] [address]   MRSA PCR Screening     Status: None   Collection Time: 09/16/20  9:45 AM   Specimen: Nasal Mucosa; Nasopharyngeal  Result Value Ref Range Status   MRSA by PCR NEGATIVE NEGATIVE Final    Comment:        The GeneXpert MRSA Assay (FDA approved for NASAL specimens only), is one component of a comprehensive MRSA colonization surveillance program. It is not intended to diagnose MRSA infection nor to guide or monitor treatment for MRSA infections. Performed at [facility], [address], [place], [place] [address]   C Difficile  Quick Screen w PCR reflex     Status: None   Collection Time: 09/17/20  3:54 PM   Specimen: STOOL  Result Value Ref Range Status   C Diff antigen NEGATIVE NEGATIVE Final   C Diff toxin NEGATIVE NEGATIVE Final   C Diff interpretation No C. difficile detected.  Final    Comment: Performed at [facility], [address], [place], [place] [address]     Medications:   . amoxicillin-clavulanate  1 tablet Oral Q12H  . aspirin EC  81 mg Oral Daily  . atorvastatin  40 mg Oral QHS  . azithromycin  500 mg Oral Daily  . busPIRone  15 mg Oral BID  . Chlorhexidine Gluconate Cloth  6 each Topical Daily  . enoxaparin (LOVENOX) injection  40 mg Subcutaneous Q24H  . insulin aspart  0-20 Units Subcutaneous TID WC  . insulin aspart  0-5 Units Subcutaneous QHS  . insulin glargine  70 Units Subcutaneous Q24H  . losartan  12.5 mg Oral Daily  . metoprolol succinate  25 mg Oral Daily  . mirabegron ER  50 mg Oral Daily  . pantoprazole  40 mg Oral Daily  . PARoxetine  60 mg Oral Daily  . pregabalin  300 mg Oral Q12H  . QUEtiapine  100 mg Oral QHS  .  sodium chloride flush  3 mL Intravenous Q12H  . sodium chloride flush  3 mL Intravenous Q12H   Continuous Infusions: . sodium chloride    . insulin Stopped (09/16/20 2000)      LOS: 5 days   Charlynne Cousins  [facility]  09/21/2020, 10:47 AM

## 2020-09-21 NOTE — Progress Notes (Addendum)
The patient has been seen in conjunction with Reino Bellis, NP. All aspects of care have been considered and discussed. The patient has been personally interviewed, examined, and all clinical data has been reviewed.   Radial site okay.  Moderate CAD.  Has new systolic HF, non-ischemic etiology. 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  Will need up-titration of guideline directed therapy as OP. Adding spironolactone to Losartan, and metoprolol. May do better with Entresto than losartan if LV function persists on repest echo..  Needs 1 week BMET and TOC with Dr. Domenic Polite.   Progress Note  Patient Name: Yolanda [name] Date of Encounter: 09/21/2020  [facility] Cardiologist: Carlyle Dolly, MD   Subjective   No cardiac complaints this morning. Still having significant pain with urination.   Inpatient Medications    Scheduled Meds: . amoxicillin-clavulanate  1 tablet Oral Q12H  . aspirin EC  81 mg Oral Daily  . atorvastatin  40 mg Oral QHS  . azithromycin  500 mg Oral Daily  . busPIRone  15 mg Oral BID  . Chlorhexidine Gluconate Cloth  6 each Topical Daily  . enoxaparin (LOVENOX) injection  40 mg Subcutaneous Q24H  . insulin aspart  0-20 Units Subcutaneous TID WC  . insulin aspart  0-5 Units Subcutaneous QHS  . insulin glargine  70 Units Subcutaneous Q24H  . losartan  12.5 mg Oral Daily  . metoprolol succinate  12.5 mg Oral Daily  . mirabegron ER  50 mg Oral Daily  . pantoprazole  40 mg Oral Daily  . PARoxetine  60 mg Oral Daily  . pregabalin  300 mg Oral Q12H  . QUEtiapine  100 mg Oral QHS  . sodium chloride flush  3 mL Intravenous Q12H  . sodium chloride flush  3 mL Intravenous Q12H   Continuous Infusions: . sodium chloride    . insulin Stopped (09/16/20 2000)   PRN Meds: sodium chloride, acetaminophen **OR** acetaminophen, albuterol, alum & mag hydroxide-simeth, clonazePAM, dextrose, ondansetron **OR** ondansetron (ZOFRAN) IV, oxyCODONE, polyethylene glycol, sodium  chloride flush   Vital Signs    Vitals:   09/20/20 1523 09/20/20 1600 09/20/20 1954 09/21/20 0017  BP: (!) 137/95  (!) 152/97 128/85  Pulse: (!) 110 94 98 98  Resp:   17 16  Temp:   98.5 F (36.9 C) 97.6 F (36.4 C)  TempSrc:   Oral Oral  SpO2: 97% 95% 94% 90%  Weight:      Height:        Intake/Output Summary (Last 24 hours) at 09/21/2020 0839 Last data filed at 09/20/2020 1955 Gross per 24 hour  Intake 600 ml  Output 4100 ml  Net -3500 ml   Last 3 Weights 09/18/2020 09/16/2020 09/16/2020  Weight (lbs) 239 lb 13.8 oz 236 lb 8.9 oz 231 lb 7.7 oz  Weight (kg) 108.8 kg 107.3 kg 105 kg      Telemetry    SR-->ST - Personally Reviewed  ECG    No new tracing this morning  Physical Exam  Pleasant younger female GEN: No acute distress.   Neck: No JVD Cardiac: RRR, no murmurs, rubs, or gallops.  Respiratory: Clear to auscultation bilaterally. GI: Soft, nontender, non-distended  MS: No edema; No deformity. Right radial cath site stable.  Neuro:  Nonfocal  Psych: Normal affect   Labs    High Sensitivity Troponin:   Recent Labs  Lab 09/16/20 1900 09/16/20 2059 09/17/20 1634 09/17/20 2247 09/18/20 0528  TROPONINIHS 2,445* 2,886* 1,865* 1,429* 1,416*  Chemistry Recent Labs  Lab 09/19/20 0420 09/19/20 0420 09/20/20 0313 09/20/20 0313 09/20/20 1049 09/20/20 1050 09/20/20 1606 09/21/20 0134  NA 140   < > 140   < > 141 141  --  140  K 3.5   < > 3.7   < > 3.9 3.9  --  3.8  CL 110  --  107  --   --   --   --  106  CO2 21*  --  21*  --   --   --   --  23  GLUCOSE 97  --  118*  --   --   --   --  99  BUN 13  --  13  --   --   --   --  10  CREATININE 0.73   < > 0.63  --   --   --  0.66 0.75  CALCIUM 8.1*  --  8.6*  --   --   --   --  9.2  PROT 6.1*  --  6.4*  --   --   --   --  6.4*  ALBUMIN 2.6*  --  2.7*  --   --   --   --  2.8*  AST 62*  --  38  --   --   --   --  36  ALT 275*  --  192*  --   --   --   --  160*  ALKPHOS 131*  --  161*  --   --   --    --  171*  BILITOT 0.8  --  0.8  --   --   --   --  0.9  GFRNONAA >60   < > >60  --   --   --  >60 >60  ANIONGAP 9  --  12  --   --   --   --  11   < > = values in this interval not displayed.     Hematology Recent Labs  Lab 09/20/20 0313 09/20/20 1049 09/20/20 1050 09/20/20 1606 09/21/20 0134  WBC 7.4  --   --  7.5 9.4  RBC 4.03  --   --  4.01 4.47  HGB 11.0*   < > 10.5* 10.8* 12.1  HCT 35.1*   < > 31.0* 34.5* 38.9  MCV 87.1  --   --  86.0 87.0  MCH 27.3  --   --  26.9 27.1  MCHC 31.3  --   --  31.3 31.1  RDW 15.6*  --   --  15.6* 15.7*  PLT 162  --   --  184 219   < > = values in this interval not displayed.    BNP Recent Labs  Lab 09/16/20 0203 09/19/20 0420  BNP 110.0* 345.0*     DDimer  Recent Labs  Lab 09/16/20 0203  DDIMER 9.53*     Radiology    CARDIAC CATHETERIZATION  Result Date: 09/20/2020 1.  Moderate coronary artery disease involving the distal RCA 2.  Mild nonobstructive disease involving the LAD 3.  Patent left main and left circumflex with no significant stenosis 4.  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  Should have repeat echocardiogram within 3 months to assess for LV recovery.   Cardiac Studies  Echo: 09/16/20  IMPRESSIONS    1. There is severe hypokinesis of the mid-to-distal anterolateral,  septal, and anterior LV segments. The apex appears akinetic. Pattern  concerning for wrap-around LAD lesion versus possible takotsubo  cardiomyopathy.. Left ventricular ejection fraction,  by estimation, is 20 to 25%. The left ventricle has severely decreased  function. There is mild concentric left ventricular hypertrophy. Left  ventricular diastolic parameters are indeterminate.  2. Right ventricular systolic function is mildly reduced. The right    ventricular size is normal.  3. The mitral valve is normal in structure. Trivial mitral valve  regurgitation.  4. The aortic valve was not well visualized. Aortic valve regurgitation  is not visualized.  5. The inferior vena cava is dilated in size with <50% respiratory  variability, suggesting right atrial pressure of 15 mmHg.   Cath: 09/20/20  1.  Moderate coronary artery disease involving the distal RCA 2.  Mild nonobstructive disease involving the LAD 3.  Patent left main and left circumflex with no significant stenosis 4.  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  Should have repeat echocardiogram within 3 months to assess for LV recovery.  Diagnostic Dominance: Right    Patient Profile     49 y.o. female w/ PMH of HTN, HLD, Type 2 DM and GERD who is currently admitted for sepsis secondary to PNA and UTI. Cardiology consulted for elevated troponin and new cardiomyopathy with EF at 20-25%.   Assessment & Plan    1. NSTEMI/Acute Systolic CHF: HS Troponin values peaked at 2886 this admission in the setting of severe sepsis, lactic acidosis and DKA. Echo showed a reduced EF of 20-25% with the apex appearing akinetic as outlined above and concerning for LAD lesion versus takotsubo cardiomyopathy.  -- underwent cardiac cath noted above with mild to moderate nonobstructive disease with recommendations for medical management. 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 -- will continue on ASA, statin, BB, and ARB. No chest pain overnight. Stable telemetry. -- increase Toprol to 68m daily. Titrate losartan is Bp remains stable. [facility].   2. Severe Sepsis: Felt to be secondary to PNA vs UTI. Lactic Acid peaked at 4.7 but has now normalized. She remains on Augmentin and  Azithromycin.  -- Further management per admitting team.   3. DKA: Now resolved. Hgb A1c at 11.1. States she has asked to be referred to endocrinology but PCP had not referred. I encouraged her to revisit this given she is on multi agents with uncontrolled Hgb A1c.   4. Elevated LFT's: AST 925 and ALT 756 on admission, now improved. -- on atorva 424mdaily, would increase to 8015ms LFTs improve.   For questions or updates, please contact [facility] consult [email or web address] for contact info under        Signed, LinReino BellisP  09/21/2020, 8:39 AM

## 2020-09-21 NOTE — Progress Notes (Addendum)
Physical Therapy Treatment Patient Details Name: Yolanda [name] MRN: [id number] DOB: [birth year] Today's Date: 09/21/2020    History of Present Illness Yolanda [name]  is a 49 y.o. female adm to [facility] with severe sepsis due to ecoli UTI. During work up pt found to have EF of 20-25% and transferred to [facility] for cardiac cath. 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  PMH - DM, panic disorder, GERD, essential hypertension, dyslipidemia, psychiatric disorders    PT Comments    Pt doing well with mobility. Can return home from PT standpoint when medically ready. History of falls at home several of which are at night going to the bathroom. Suggested turning on more lights prior to getting up to allow vision to assist with balance.    Follow Up Recommendations  Supervision - Intermittent;No PT follow up     Equipment Recommendations  None recommended by PT    Recommendations for Other Services       Precautions / Restrictions Precautions Precautions: Fall Precaution Comments: reports 6 falls in the last 6 months without serious injury. Restrictions Weight Bearing Restrictions: No    Mobility  Bed Mobility               General bed mobility comments: Pt up in chair  Transfers Overall transfer level: Modified independent Equipment used: None Transfers: Sit to/from Stand Sit to Stand: Modified independent (Device/Increase time)            Ambulation/Gait Ambulation/Gait assistance: Supervision Gait Distance (Feet): 250 Feet Assistive device: None (held with both hands) Gait Pattern/deviations: Step-through pattern;Decreased stride length Gait velocity: normal Gait velocity interpretation: >2.62 ft/sec, indicative of community ambulatory General Gait Details: No loss of balance. No reaching for support   Stairs             Wheelchair Mobility    Modified Rankin (Stroke Patients Only)       Balance Overall balance assessment: History of  Falls Sitting-balance support: Feet supported;No upper extremity supported Sitting balance-Leahy Scale: Normal     Standing balance support: During functional activity;No upper extremity supported Standing balance-Leahy Scale: Good                              Cognition Arousal/Alertness: Awake/alert Behavior During Therapy: WFL for tasks assessed/performed Overall Cognitive Status: Within Functional Limits for tasks assessed                                        Exercises      General Comments General comments (skin integrity, edema, etc.): RHR 94. EHR 110      Pertinent Vitals/Pain Pain Assessment: No/denies pain    Home Living                      Prior Function            PT Goals (current goals can now be found in the care plan section) Acute Rehab PT Goals Patient Stated Goal: go home Progress towards PT goals: Progressing toward goals    Frequency    Min 3X/week      PT Plan Discharge plan needs to be updated    Co-evaluation              AM-PAC PT "6 Clicks" Mobility   Outcome Measure  Help needed turning from  your back to your side while in a flat bed without using bedrails?: None Help needed moving from lying on your back to sitting on the side of a flat bed without using bedrails?: None Help needed moving to and from a bed to a chair (including a wheelchair)?: None Help needed standing up from a chair using your arms (e.g., wheelchair or bedside chair)?: None Help needed to walk in hospital room?: None Help needed climbing 3-5 steps with a railing? : A Little 6 Click Score: 23    End of Session   Activity Tolerance: Patient tolerated treatment well;Patient limited by fatigue Patient left: in chair;with call bell/phone within reach;with family/visitor present   PT Visit Diagnosis: History of falling (Z91.81);Unsteadiness on feet (R26.81);Other abnormalities of gait and mobility (R26.89);Muscle  weakness (generalized) (M62.81)     Time: 0518-3358 PT Time Calculation (min) (ACUTE ONLY): 10 min  Charges:  $Gait Training: 8-22 mins                     [place] Pager [phone] Office [place] 09/21/2020, 9:59 AM

## 2020-09-21 NOTE — Progress Notes (Signed)
Inpatient Diabetes Program Recommendations  AACE/ADA: New Consensus Statement on Inpatient Glycemic Control (2015)  Target Ranges:  Prepandial:   less than 140 mg/dL      Peak postprandial:   less than 180 mg/dL (1-2 hours)      Critically ill patients:  140 - 180 mg/dL   Lab Results  Component Value Date   GLUCAP 94 09/21/2020   HGBA1C 11.1 (H) 09/16/2020    Review of Glycemic Control Results for Yolanda [name], Yolanda [name] (MRN [id number]) as of 09/21/2020 08:55  Ref. Range 09/20/2020 07:51 09/20/2020 11:26 09/20/2020 16:25 09/20/2020 20:53 09/21/2020 08:07  Glucose-Capillary Latest Ref Range: 70 - 99 mg/dL 114 (H) 108 (H) 102 (H) 120 (H) 94   Diabetes history: DM 2 Outpatient Diabetes medications: Tresiba 72 units Daily, Humalog SSI before each meal, (ordered 20 units tid), Ozempic 0.5 mg weekly, Invokamet 150-500 mg bid Current orders for Inpatient glycemic control:  Lantus 70 units, Novolog 0-20 units tid + hs  Inpatient Diabetes Program Recommendations:   -Decrease Novolog correction to sensitive tid + hs 0-5 units -Decrease Lantus to 60 units qd Secure chat to Dr. Aileen Fass.  Thank you, Nani Gasser. Brisa Auth, RN, MSN, CDE  Diabetes Coordinator Inpatient Glycemic Control Team Team Pager [phone] (8am-5pm) 09/21/2020 8:59 AM

## 2020-09-21 NOTE — Plan of Care (Signed)

## 2020-09-21 NOTE — Progress Notes (Signed)
Pt does not want CPAP at this time

## 2020-09-21 NOTE — Care Management (Signed)
[id number] 09-21-20 Case Manager received consult regarding medications entresto 13m/26, farxiga and jardiance 160md. Patient has Medicaid and Rx's usually cost $3.00. The medications listed should be no more than $3.00; however, Medicaid may need a prior authorization to fill-will not know until the Rx is sent to pharmacy. Prior authorization # is [phone] in case. [facility] uses [facility] discussed with patient regarding new provider. [facility] [place] [facility] [place] [facility]. Patient needs to call Medicaid and get a list of providers in network and request the change. Medicaid reflects the physician she sees currently. [place], BrOcie CornfieldRN, BSN Case Manager

## 2020-09-22 DIAGNOSIS — B9620 Unspecified Escherichia coli [E. coli] as the cause of diseases classified elsewhere: Secondary | ICD-10-CM

## 2020-09-22 DIAGNOSIS — R7881 Bacteremia: Secondary | ICD-10-CM

## 2020-09-22 LAB — CBC
HCT: 37 % (ref 36.0–46.0)
Hemoglobin: 11.7 g/dL — ABNORMAL LOW (ref 12.0–15.0)
MCH: 26.8 pg (ref 26.0–34.0)
MCHC: 31.6 g/dL (ref 30.0–36.0)
MCV: 84.9 fL (ref 80.0–100.0)
Platelets: 242 10*3/uL (ref 150–400)
RBC: 4.36 MIL/uL (ref 3.87–5.11)
RDW: 15.6 % — ABNORMAL HIGH (ref 11.5–15.5)
WBC: 9.7 10*3/uL (ref 4.0–10.5)
nRBC: 0.3 % — ABNORMAL HIGH (ref 0.0–0.2)

## 2020-09-22 LAB — COMPREHENSIVE METABOLIC PANEL
ALT: 105 U/L — ABNORMAL HIGH (ref 0–44)
AST: 24 U/L (ref 15–41)
Albumin: 2.7 g/dL — ABNORMAL LOW (ref 3.5–5.0)
Alkaline Phosphatase: 145 U/L — ABNORMAL HIGH (ref 38–126)
Anion gap: 11 (ref 5–15)
BUN: 14 mg/dL (ref 6–20)
CO2: 23 mmol/L (ref 22–32)
Calcium: 9 mg/dL (ref 8.9–10.3)
Chloride: 104 mmol/L (ref 98–111)
Creatinine, Ser: 0.77 mg/dL (ref 0.44–1.00)
GFR, Estimated: >60 mL/min (ref >60)
Glucose, Bld: 127 mg/dL — ABNORMAL HIGH (ref 70–99)
Potassium: 3.5 mmol/L (ref 3.5–5.1)
Sodium: 138 mmol/L (ref 135–145)
Total Bilirubin: 0.7 mg/dL (ref 0.3–1.2)
Total Protein: 6.2 g/dL — ABNORMAL LOW (ref 6.5–8.1)

## 2020-09-22 LAB — GLUCOSE, CAPILLARY: Glucose-Capillary: 129 mg/dL — ABNORMAL HIGH (ref 70–99)

## 2020-09-22 MED ORDER — ATORVASTATIN CALCIUM 80 MG PO TABS: 80.0000 mg | ORAL_TABLET | Freq: Every day | ORAL | Status: DC

## 2020-09-22 MED ORDER — LOSARTAN POTASSIUM 25 MG PO TABS
12.5000 mg | ORAL_TABLET | Freq: Every day | ORAL | 3 refills | Status: DC
Start: 2020-09-23 — End: 2020-10-10

## 2020-09-22 MED ORDER — SPIRONOLACTONE 25 MG PO TABS
12.5000 mg | ORAL_TABLET | Freq: Every day | ORAL | 3 refills | Status: DC
Start: 2020-09-23 — End: 2023-10-13

## 2020-09-22 MED ORDER — METOPROLOL SUCCINATE ER 25 MG PO TB24
25.0000 mg | ORAL_TABLET | Freq: Every day | ORAL | 3 refills | Status: DC
Start: 2020-09-23 — End: 2020-12-13

## 2020-09-22 MED ORDER — ASPIRIN 81 MG PO TBEC: 81.0000 mg | DELAYED_RELEASE_TABLET | Freq: Every day | ORAL | 11 refills | Status: AC

## 2020-09-22 MED ORDER — AMOXICILLIN-POT CLAVULANATE 875-125 MG PO TABS
1.0000 | ORAL_TABLET | Freq: Two times a day (BID) | ORAL | 0 refills | Status: DC
Start: 2020-09-22 — End: 2020-09-27

## 2020-09-22 NOTE — Progress Notes (Signed)
CARDIAC REHAB PHASE I   PRE:  Rate/Rhythm: 92 SR    BP: sitting 122/90    SaO2: 96 RA  MODE:  Ambulation: 390 ft   POST:  Rate/Rhythm: 109 ST    BP: sitting 123/87     SaO2: 99 RA  Pt able to walk hall, slightly unsteady at first due to orthopedic injuries. Improved with distance. Denied SOB or CP. Discussed MI, HF management through booklet, restrictions, low sodium and DM diet, exercise (pt will need to swim or do elliptical, walking distance is limited), and CRPII. Pt very receptive. She sts she has not been watching her carbs as much lately but ready to do better. Discussed the risks of uncontrolled A1C. Will refer to [place].  [place], ACSM 09/22/2020 11:24 AM

## 2020-09-22 NOTE — Discharge Summary (Signed)
Physician Discharge Summary  LATAVIA GOGA MGN:[id number] DOB: 1971-01-12 DOA: 09/16/2020  PCP: Lucia Gaskins, MD  Admit date: 09/16/2020 Discharge date: 09/22/2020  Admitted From: Home Disposition:  Home  Recommendations for Outpatient Follow-up:  1. Follow up with Cards in 1-2 weeks 2. Please obtain BMP/CBC in one week   Home Health:No Equipment/Devices:none  Discharge Condition:Stable CODE STATUS:Full Diet recommendation: Heart Healthy   Brief/Interim Summary: 49 y.o. female past medical history of diabetes mellitus type 2, panic disorder, essential hypertension psychiatric disorder comes into the ED with chief complains of I was freezing when she came into the ED she was saturating 40% on room air placed on a nonrebreather.  She has not received her Covid vaccine, she does relate missing several doses of her insulin.  Discharge Diagnoses:  Active Problems:   Hypertension   Bipolar 1 disorder (HCC)   Type 2 diabetes mellitus, uncontrolled (HCC)   Urinary tract infection, site not specified   Severe sepsis (HCC)   Acute respiratory failure with hypoxia (HCC)   NSTEMI (non-ST elevated myocardial infarction) ([place])   DKA, type 2 ([place])   CAP (community acquired pneumonia)   AKI (acute kidney injury) ([place])   Acute systolic HF (heart failure) (HCC)   Takotsubo syndrome  Severe sepsis secondary to bacteremia E. coli: Started empirically on antibiotics on admission 1/2 blood cultures grew E. coli. She was changed to oral Augmentin which E. coli was sensitive to. She will continue this for 1 more day as an outpatient.  Failure with hypoxia possibly due to pneumonia: CT angio of the chest showed bilateral infiltrates culture data was negative SARS-CoV-2 PCR and influenza PCR were negative. She completed a course of antibiotics in house.  Elevated troponin question stress-induced cardiomyopathy (Takotsubo cardiomyopathy): 2D echo was done on 09/16/2020 that showed severe  hypokinesia of the mid to distal anterior and septal wall with an EF of 25% cardiology was consulted she was placed n.p.o. started on IV heparin cath was performed on 09/20/2020 that showed nonobstructive coronary disease. Cardiology recommended to continue aspirin statins beta-blocker, Aldactone and ARB as an outpatient.  Acute systolic heart failure: With a new drop in EF to 25% nonischemic cardiomyopathy. She will follow up with cardiology as an outpatient uptitrate beta-blocker and ACE inhibitor as tolerated.  DKA diabetic mellitus type II: Admission she was started on IV insulin IV fluids. When she was able to tolerate she was transitioned to her home dose of medications no changes were made to her medication.  Acute kidney injury: Likely prerenal azotemia resolved with IV fluid hydration.  Elevated LFTs: Negative reactive, her statins were recently she will continue them as an outpatient.  Essential hypertension: Her blood pressure seems to be well controlled see above for further details.  Diarrhea likely due to antibiotics C. difficile PCR was negative.  Vaginal discharge: She was started on oral Diflucan which should continue as an outpatient.  Right toe stage II pressure ulcer present on on admission  Discharge Instructions  Discharge Instructions    Ambulatory referral to Endocrinology   Complete by: As directed    Diet - low sodium heart healthy   Complete by: As directed    Increase activity slowly   Complete by: As directed    No wound care   Complete by: As directed      Allergies as of 09/22/2020      Reactions   Abilify [aripiprazole] Other (See Comments)   Altered mental status    Sulfa Antibiotics Hives,  Rash, Other (See Comments)   unknown      Medication List    STOP taking these medications   atenolol 25 MG tablet Commonly known as: TENORMIN   betamethasone dipropionate 0.05 % cream   Biotin 2500 MCG Caps   cetirizine 10 MG  tablet Commonly known as: ZYRTEC   fluconazole 100 MG tablet Commonly known as: DIFLUCAN   lisinopril 5 MG tablet Commonly known as: ZESTRIL   mupirocin ointment 2 % Commonly known as: BACTROBAN   naproxen 500 MG tablet Commonly known as: NAPROSYN   vitamin C 1000 MG tablet     TAKE these medications   amoxicillin-clavulanate 875-125 MG tablet Commonly known as: AUGMENTIN Take 1 tablet by mouth every 12 (twelve) hours.   aspirin 81 MG EC tablet Take 1 tablet (81 mg total) by mouth daily. Swallow whole. Start taking on: September 23, 2020   atorvastatin 40 MG tablet Commonly known as: LIPITOR Take 40 mg by mouth at bedtime.   busPIRone 15 MG tablet Commonly known as: BUSPAR Take 15 mg by mouth 2 (two) times daily as needed (anxiety).   clonazePAM 0.5 MG tablet Commonly known as: KLONOPIN Take 0.5 mg by mouth 2 (two) times daily as needed for anxiety.   HumaLOG 100 UNIT/ML injection Generic drug: insulin lispro Inject 20 Units into the skin 3 (three) times daily.   Invokamet 150-500 MG Tabs Generic drug: Canagliflozin-metFORMIN HCl Take 1 tablet by mouth 2 (two) times daily.   losartan 25 MG tablet Commonly known as: COZAAR Take 0.5 tablets (12.5 mg total) by mouth daily. Start taking on: September 23, 2020   metoprolol succinate 25 MG 24 hr tablet Commonly known as: TOPROL-XL Take 1 tablet (25 mg total) by mouth daily. Start taking on: September 23, 2020   mirabegron ER 50 MG Tb24 tablet Commonly known as: Myrbetriq Take 1 tablet (50 mg total) by mouth daily. At bedtime   omeprazole 40 MG capsule Commonly known as: PRILOSEC Take 40 mg by mouth daily.   Oxycodone HCl 10 MG Tabs Take 10 mg by mouth every 6 (six) hours as needed for pain.   Ozempic (0.25 or 0.5 MG/DOSE) 2 MG/1.5ML Sopn Generic drug: Semaglutide(0.25 or 0.5MG/DOS) Inject 0.5 mg into the skin once a week. Sunday   PARoxetine 30 MG tablet Commonly known as: PAXIL Take 60 mg by mouth at  bedtime.   pregabalin 300 MG capsule Commonly known as: LYRICA Take 300 mg by mouth every 12 (twelve) hours.   QUEtiapine 100 MG tablet Commonly known as: SEROQUEL Take 100 mg by mouth as needed (mood).   risperiDONE 1 MG tablet Commonly known as: RISPERDAL Take 1 mg by mouth at bedtime.   spironolactone 25 MG tablet Commonly known as: ALDACTONE Take 0.5 tablets (12.5 mg total) by mouth daily. Start taking on: September 23, 2020   Toviaz 8 MG Tb24 tablet Generic drug: fesoterodine Take 8 mg by mouth daily.   Tyler Aas FlexTouch 200 UNIT/ML FlexTouch Pen Generic drug: insulin degludec Inject 72 Units into the skin daily.   Trokendi XR 200 MG Cp24 Generic drug: Topiramate ER Take 200 mg by mouth daily.       Allergies  Allergen Reactions  . Abilify [Aripiprazole] Other (See Comments)    Altered mental status   . Sulfa Antibiotics Hives, Rash and Other (See Comments)    unknown    Consultations: Cardiology  Procedures/Studies: CT Angio Chest PE W/Cm &/Or Wo Cm  Result Date: 09/16/2020 CLINICAL DATA:  Chest  pain, shortness of breath and fever. Evaluate for pulmonary embolism. EXAM: CT ANGIOGRAPHY CHEST WITH CONTRAST TECHNIQUE: Multidetector CT imaging of the chest was performed using the standard protocol during bolus administration of intravenous contrast. Multiplanar CT image reconstructions and MIPs were obtained to evaluate the vascular anatomy. CONTRAST:  74m OMNIPAQUE IOHEXOL 350 MG/ML SOLN COMPARISON:  Chest radiograph-earlier same day; CT the chest, abdomen pelvis-04/09/2011 FINDINGS: Vascular Findings: There is adequate opacification of the pulmonary arterial system with the main pulmonary artery measuring 404 Hounsfield units. There are no discrete filling defects within the pulmonary arterial tree to suggest pulmonary embolism. Normal caliber of the main pulmonary artery. Normal heart size. Coronary artery calcifications. No pericardial effusion. No evidence of  thoracic aortic aneurysm. Bovine configuration of the aortic arch. Review of the MIP images confirms the above findings. ---------------------------------------------------------------------------------- Nonvascular Findings: Mediastinum/Lymph Nodes: No bulky mediastinal, hilar axillary lymphadenopathy. Lungs/Pleura: Extensive bilateral nodular ground-glass airspace opacities with slight left-sided predominance and pleural sparing. There is mild centralized bronchial airway thickening however the central airways appear patent. Trace bilateral pleural effusions. No pneumothorax. Evaluation for discrete pulmonary nodules is degraded secondary to parenchymal abnormalities. Upper abdomen: Limited early arterial phase evaluation of the upper abdomen demonstrates nodularity of the hepatic contour. Musculoskeletal: No acute or aggressive osseous abnormalities. 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 Regional soft tissues appear normal. Normal appearance of the thyroid gland. IMPRESSION: 1. No evidence of pulmonary embolism. 2. Extensive bilateral nodular ground-glass airspace opacities with slight left-sided predominance and pleural sparing, nonspecific with differential considerations including asymmetric alveolar pulmonary edema (favored given markedly elevated troponin level) and multifocal atypical infection (including COVID-19). Clinical correlation is advised. 3. Coronary artery calcifications. Aortic Atherosclerosis (ICD10-I70.0). 4. Nodularity of the hepatic contour as could be seen in the setting of early cirrhotic change. Correlation with LFTs is advised. Electronically Signed   By: JSandi MariscalM.D.   On: 09/16/2020 09:32   CARDIAC CATHETERIZATION  Result Date: 09/20/2020 1.  Moderate coronary artery disease involving the distal RCA 2.  Mild nonobstructive disease involving the LAD 3.  Patent left main and left circumflex with no significant stenosis 4.  Mildly elevated right and left heart  filling pressures as documented, with preserved cardiac output Recommendations: Medical therapy for CAD, continue heart failure therapy for this patient with probable Takotsubo cardiomyopathy.  Should have repeat echocardiogram within 3 months to assess for LV recovery.  Portable chest 1 View  Result Date: 09/16/2020 CLINICAL DATA:  Shortness of breath.  Reported COVID-19 negative EXAM: PORTABLE CHEST 1 VIEW COMPARISON:  September 16, 2020 study obtained earlier in the day; CT angiogram chest September 16, 2020 FINDINGS: There is airspace opacity throughout the lungs bilaterally, somewhat more on the left than on the right, similar in appearance to studies obtained earlier in the day. No new opacity evident. Heart size and pulmonary vascularity are normal. No adenopathy. No bone lesions. IMPRESSION: Widespread airspace opacity which has an appearance which could be indicative of pulmonary edema or multifocal pneumonia. Both entities may be present concurrently. Heart size normal. No adenopathy. Electronically Signed   By: WLowella GripIII M.D.   On: 09/16/2020 14:26   DG Chest Portable 1 View  Result Date: 09/16/2020 CLINICAL DATA:  Shortness of breath EXAM: PORTABLE CHEST 1 VIEW COMPARISON:  05/11/2017 FINDINGS: Shallow lung inflation without focal airspace consolidation or pulmonary edema. Cardiomediastinal contours and pleural spaces are normal. IMPRESSION: Shallow lung inflation without focal airspace disease. Electronically Signed   By: KLennette Bihari  [place] M.D.   On: 09/16/2020 02:56   ECHOCARDIOGRAM COMPLETE  Result Date: 09/16/2020    ECHOCARDIOGRAM REPORT   Patient Name:   ERINE PHENIX Date of Exam: 09/16/2020 Medical Rec #:  [id number]      Height:       67.0 in Accession #:    [id number]     Weight:       236.6 lb Date of Birth:  [birth year]     BSA:          2.172 m Patient Age:    39 years       BP:           87/46 mmHg Patient Gender: F              HR:           112 bpm. Exam Location:   [facility] Procedure: 2D Echo Indications:    Elevated Troponin  History:        Patient has no prior history of Echocardiogram examinations.                 Risk Factors:Hypertension, Diabetes and Dyslipidemia. Borderline                 personality disorder.  Sonographer:    Leavy Cella RDCS (AE) Referring Phys: [place]  1. There is severe hypokinesis of the mid-to-distal anterolateral, septal, and anterior LV segments. The apex appears akinetic. Pattern concerning for wrap-around LAD lesion versus possible takotsubo cardiomyopathy.. Left ventricular ejection fraction, by estimation, is 20 to 25%. The left ventricle has severely decreased function. There is mild concentric left ventricular hypertrophy. Left ventricular diastolic parameters are indeterminate.  2. Right ventricular systolic function is mildly reduced. The right ventricular size is normal.  3. The mitral valve is normal in structure. Trivial mitral valve regurgitation.  4. The aortic valve was not well visualized. Aortic valve regurgitation is not visualized.  5. The inferior vena cava is dilated in size with <50% respiratory variability, suggesting right atrial pressure of 15 mmHg. Comparison(s): No prior Echocardiogram. FINDINGS  Left Ventricle: There is severe hypokinesis of the mid-to-distal anterolateral, septal, and anterior LV segments. The apex appears akinetic. 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 Left ventricular ejection fraction, by estimation, is 20 to 25%. The left ventricle has severely decreased function. The left ventricular internal cavity size was normal in size. There is mild concentric left ventricular hypertrophy. Indeterminate diastolic filling due to E-A fusion. Right Ventricle: The right ventricular size is normal. Right vetricular wall thickness was not well visualized. Right ventricular systolic function is mildly reduced. Left Atrium: Left  atrial size was normal in size. Right Atrium: Right atrial size was normal in size. Pericardium: There is no evidence of pericardial effusion. Mitral Valve: The mitral valve is normal in structure. There is mild thickening of the mitral valve leaflet(s). There is mild calcification of the mitral valve leaflet(s). Mild mitral annular calcification. Trivial mitral valve regurgitation. Tricuspid Valve: The tricuspid valve is normal in structure. Tricuspid valve regurgitation is trivial. Aortic Valve: The aortic valve was not well visualized. Aortic valve regurgitation is not visualized. Pulmonic Valve: The pulmonic valve was not well visualized. Pulmonic valve regurgitation is trivial. Aorta: The aortic root is normal in size and structure. Venous: The inferior vena cava is dilated in size with less than 50% respiratory variability, suggesting right atrial pressure of 15 mmHg. IAS/Shunts: No atrial level shunt detected by  color flow Doppler.  LEFT VENTRICLE PLAX 2D LVIDd:         4.09 cm  Diastology LVIDs:         2.61 cm  LV e' medial:    5.55 cm/s LV PW:         1.42 cm  LV E/e' medial:  16.2 LV IVS:        1.19 cm  LV e' lateral:   7.18 cm/s LVOT diam:     2.10 cm  LV E/e' lateral: 12.5 LVOT Area:     3.46 cm  RIGHT VENTRICLE RV S prime:     6.53 cm/s TAPSE (M-mode): 1.5 cm LEFT ATRIUM             Index       RIGHT ATRIUM          Index LA diam:        3.30 cm 1.52 cm/m  RA Area:     8.18 cm LA Vol (A2C):   59.6 ml 27.44 ml/m RA Volume:   17.90 ml 8.24 ml/m LA Vol (A4C):   32.6 ml 15.01 ml/m LA Biplane Vol: 44.6 ml 20.54 ml/m  MITRAL VALVE MV Area (PHT): 6.22 cm    SHUNTS MV Decel Time: 122 msec    Systemic Diam: 2.10 cm MV E velocity: 90.00 cm/s MV A velocity: 47.10 cm/s MV E/A ratio:  1.91 Gwyndolyn Kaufman MD Electronically signed by Gwyndolyn Kaufman MD Signature Date/Time: 09/16/2020/5:43:42 PM    Final      Subjective: She has no new complaints this morning.  Discharge Exam: Vitals:   09/22/20  0335 09/22/20 0800  BP: 101/71 116/76  Pulse:  95  Resp: 18 18  Temp: 98.7 F (37.1 C) 98.2 F (36.8 C)  SpO2: 92% 93%   Vitals:   09/21/20 1357 09/21/20 1956 09/22/20 0335 09/22/20 0800  BP: 125/86  101/71 116/76  Pulse:    95  Resp: 16 18 18 18   Temp: 98.2 F (36.8 C) 98.7 F (37.1 C) 98.7 F (37.1 C) 98.2 F (36.8 C)  TempSrc: Oral Oral Oral Oral  SpO2:   92% 93%  Weight:   107.9 kg   Height:        General: Pt is alert, awake, not in acute distress Cardiovascular: RRR, S1/S2 +, no rubs, no gallops Respiratory: CTA bilaterally, no wheezing, no rhonchi Abdominal: Soft, NT, ND, bowel sounds + Extremities: no edema, no cyanosis    The results of significant diagnostics from this hospitalization (including imaging, microbiology, ancillary and laboratory) are listed below for reference.     Microbiology: Recent Results (from the past 240 hour(s))  Respiratory Panel by RT PCR (Flu A&B, Covid) - Nasopharyngeal Swab     Status: None   Collection Time: 09/16/20  2:12 AM   Specimen: Nasopharyngeal Swab  Result Value Ref Range Status   SARS Coronavirus 2 by RT PCR NEGATIVE NEGATIVE Final    Comment: (NOTE) SARS-CoV-2 target nucleic acids are NOT DETECTED.  The SARS-CoV-2 RNA is generally detectable in upper respiratoy specimens during the acute phase of infection. The lowest concentration of SARS-CoV-2 viral copies this assay can detect is 131 copies/mL. A negative result does not preclude SARS-Cov-2 infection and should not be used as the sole basis for treatment or other patient management decisions. A negative result may occur with  improper specimen collection/handling, submission of specimen other than nasopharyngeal swab, presence of viral mutation(s) within the areas targeted by this assay, and  inadequate number of viral copies (<131 copies/mL). A negative result must be combined with clinical observations, patient history, and epidemiological information.  The expected result is Negative.  Fact Sheet for Patients:  PinkCheek.be  Fact Sheet for Healthcare Providers:  GravelBags.it  This test is no t yet approved or cleared by the [place] FDA and  has been authorized for detection and/or diagnosis of SARS-CoV-2 by FDA under an Emergency Use Authorization (EUA). This EUA will remain  in effect (meaning this test can be used) for the duration of the COVID-19 declaration under Section 564(b)(1) of the Act, 21 U.S.C. section 360bbb-3(b)(1), unless the authorization is terminated or revoked sooner.     Influenza A by PCR NEGATIVE NEGATIVE Final   Influenza B by PCR NEGATIVE NEGATIVE Final    Comment: (NOTE) The Xpert Xpress SARS-CoV-2/FLU/RSV assay is intended as an aid in  the diagnosis of influenza from Nasopharyngeal swab specimens and  should not be used as a sole basis for treatment. Nasal washings and  aspirates are unacceptable for Xpert Xpress SARS-CoV-2/FLU/RSV  testing.  Fact Sheet for Patients: PinkCheek.be  Fact Sheet for Healthcare Providers: GravelBags.it  This test is not yet approved or cleared by the [place] FDA and  has been authorized for detection and/or diagnosis of SARS-CoV-2 by  FDA under an Emergency Use Authorization (EUA). This EUA will remain  in effect (meaning this test can be used) for the duration of the  Covid-19 declaration under Section 564(b)(1) of the Act, 21  U.S.C. section 360bbb-3(b)(1), unless the authorization is  terminated or revoked. Performed at [facility], [address], [place], [place] [address]   Urine culture     Status: Abnormal   Collection Time: 09/16/20  2:35 AM   Specimen: Urine, Catheterized  Result Value Ref Range Status   Specimen Description   Final    URINE, CATHETERIZED Performed at [facility], [address] [place], [place] [address]     Special Requests   Final    NONE Performed at [facility], [address] [place], [place] [address]    Culture >=100,000 COLONIES/mL ESCHERICHIA COLI (A)  Final   Report Status 09/18/2020 FINAL  Final   Organism ID, Bacteria ESCHERICHIA COLI (A)  Final      Susceptibility   Escherichia coli - MIC*    AMPICILLIN 4 SENSITIVE Sensitive     CEFAZOLIN <=4 SENSITIVE Sensitive     CEFTRIAXONE <=0.25 SENSITIVE Sensitive     CIPROFLOXACIN <=0.25 SENSITIVE Sensitive     GENTAMICIN <=1 SENSITIVE Sensitive     IMIPENEM <=0.25 SENSITIVE Sensitive     NITROFURANTOIN <=16 SENSITIVE Sensitive     TRIMETH/SULFA <=20 SENSITIVE Sensitive     AMPICILLIN/SULBACTAM <=2 SENSITIVE Sensitive     PIP/TAZO <=4 SENSITIVE Sensitive     * >=100,000 COLONIES/mL ESCHERICHIA COLI  Blood Culture (routine x 2)     Status: Abnormal   Collection Time: 09/16/20  2:52 AM   Specimen: Left Antecubital; Blood  Result Value Ref Range Status   Specimen Description   Final    LEFT ANTECUBITAL Performed at [facility], [address], [place], [place] [address]    Special Requests   Final    BOTTLES DRAWN AEROBIC AND ANAEROBIC Blood Culture adequate volume Performed at [facility], [address] [place], [place] [address]    Culture  Setup Time   Final    GRAM NEGATIVE RODS CRITICAL RESULT CALLED TO, READ BACK BY AND VERIFIED WITH: RN  C ALSTON [id number] AT 169 AM BY CM Performed at [facility], [place] [address] [place], [place] [address]    Culture ESCHERICHIA COLI (A)  Final   Report Status 09/20/2020 FINAL  Final   Organism ID, Bacteria ESCHERICHIA COLI  Final      Susceptibility   Escherichia coli - MIC*    AMPICILLIN 4 SENSITIVE Sensitive     CEFAZOLIN <=4 SENSITIVE Sensitive     CEFEPIME <=0.12 SENSITIVE Sensitive     CEFTAZIDIME <=1 SENSITIVE Sensitive     CEFTRIAXONE <=0.25 SENSITIVE Sensitive     CIPROFLOXACIN <=0.25 SENSITIVE Sensitive     GENTAMICIN <=1 SENSITIVE Sensitive     IMIPENEM <=0.25  SENSITIVE Sensitive     TRIMETH/SULFA <=20 SENSITIVE Sensitive     AMPICILLIN/SULBACTAM <=2 SENSITIVE Sensitive     PIP/TAZO <=4 SENSITIVE Sensitive     * ESCHERICHIA COLI  Blood Culture ID Panel (Reflexed)     Status: Abnormal   Collection Time: 09/16/20  2:52 AM  Result Value Ref Range Status   Enterococcus faecalis NOT DETECTED NOT DETECTED Final   Enterococcus Faecium NOT DETECTED NOT DETECTED Final   Listeria monocytogenes NOT DETECTED NOT DETECTED Final   Staphylococcus species NOT DETECTED NOT DETECTED Final   Staphylococcus aureus (BCID) NOT DETECTED NOT DETECTED Final   Staphylococcus epidermidis NOT DETECTED NOT DETECTED Final   Staphylococcus lugdunensis NOT DETECTED NOT DETECTED Final   Streptococcus species NOT DETECTED NOT DETECTED Final   Streptococcus agalactiae NOT DETECTED NOT DETECTED Final   Streptococcus pneumoniae NOT DETECTED NOT DETECTED Final   Streptococcus pyogenes NOT DETECTED NOT DETECTED Final   A.calcoaceticus-baumannii NOT DETECTED NOT DETECTED Final   Bacteroides fragilis NOT DETECTED NOT DETECTED Final   Enterobacterales DETECTED (A) NOT DETECTED Final    Comment: Enterobacterales represent a large order of gram negative bacteria, not a single organism. CRITICAL RESULT CALLED TO, READ BACK BY AND VERIFIED WITH: RN C ALSTON [id number] AT 900 AM BY CM    Enterobacter cloacae complex NOT DETECTED NOT DETECTED Final   Escherichia coli DETECTED (A) NOT DETECTED Final    Comment: CRITICAL RESULT CALLED TO, READ BACK BY AND VERIFIED WITH: RN C ALSTON [id number] AT 900 AM BY CM    Klebsiella aerogenes NOT DETECTED NOT DETECTED Final   Klebsiella oxytoca NOT DETECTED NOT DETECTED Final   Klebsiella pneumoniae NOT DETECTED NOT DETECTED Final   Proteus species NOT DETECTED NOT DETECTED Final   Salmonella species NOT DETECTED NOT DETECTED Final   Serratia marcescens NOT DETECTED NOT DETECTED Final   Haemophilus influenzae NOT DETECTED NOT DETECTED Final   Neisseria  meningitidis NOT DETECTED NOT DETECTED Final   Pseudomonas aeruginosa NOT DETECTED NOT DETECTED Final   Stenotrophomonas maltophilia NOT DETECTED NOT DETECTED Final   Candida albicans NOT DETECTED NOT DETECTED Final   Candida auris NOT DETECTED NOT DETECTED Final   Candida glabrata NOT DETECTED NOT DETECTED Final   Candida krusei NOT DETECTED NOT DETECTED Final   Candida parapsilosis NOT DETECTED NOT DETECTED Final   Candida tropicalis NOT DETECTED NOT DETECTED Final   Cryptococcus neoformans/gattii NOT DETECTED NOT DETECTED Final   CTX-M ESBL NOT DETECTED NOT DETECTED Final   Carbapenem resistance IMP NOT DETECTED NOT DETECTED Final   Carbapenem resistance KPC NOT DETECTED NOT DETECTED Final   Carbapenem resistance NDM NOT DETECTED NOT DETECTED Final   Carbapenem resist OXA 48 LIKE NOT DETECTED NOT DETECTED Final   Carbapenem resistance VIM NOT DETECTED NOT DETECTED Final  Comment: Performed at [facility], [place] [address], [place], [place] [address]  Blood Culture (routine x 2)     Status: None   Collection Time: 09/16/20  3:09 AM   Specimen: BLOOD LEFT HAND  Result Value Ref Range Status   Specimen Description BLOOD LEFT HAND  Final   Special Requests   Final    BOTTLES DRAWN AEROBIC AND ANAEROBIC Blood Culture adequate volume   Culture   Final    NO GROWTH 5 DAYS Performed at [facility], [address], [place], [place] [address]    Report Status 09/21/2020 FINAL  Final  Respiratory Panel by RT PCR (Flu A&B, Covid) - Nasopharyngeal Swab     Status: None   Collection Time: 09/16/20  6:03 AM   Specimen: Nasopharyngeal Swab  Result Value Ref Range Status   SARS Coronavirus 2 by RT PCR NEGATIVE NEGATIVE Final    Comment: (NOTE) SARS-CoV-2 target nucleic acids are NOT DETECTED.  The SARS-CoV-2 RNA is generally detectable in upper respiratoy specimens during the acute phase of infection. The lowest concentration of SARS-CoV-2 viral copies this assay can detect is 131  copies/mL. A negative result does not preclude SARS-Cov-2 infection and should not be used as the sole basis for treatment or other patient management decisions. A negative result may occur with  improper specimen collection/handling, submission of specimen other than nasopharyngeal swab, presence of viral mutation(s) within the areas targeted by this assay, and inadequate number of viral copies (<131 copies/mL). A negative result must be combined with clinical observations, patient history, and epidemiological information. The expected result is Negative.  Fact Sheet for Patients:  PinkCheek.be  Fact Sheet for Healthcare Providers:  GravelBags.it  This test is no t yet approved or cleared by the [place] FDA and  has been authorized for detection and/or diagnosis of SARS-CoV-2 by FDA under an Emergency Use Authorization (EUA). This EUA will remain  in effect (meaning this test can be used) for the duration of the COVID-19 declaration under Section 564(b)(1) of the Act, 21 U.S.C. section 360bbb-3(b)(1), unless the authorization is terminated or revoked sooner.     Influenza A by PCR NEGATIVE NEGATIVE Final   Influenza B by PCR NEGATIVE NEGATIVE Final    Comment: (NOTE) The Xpert Xpress SARS-CoV-2/FLU/RSV assay is intended as an aid in  the diagnosis of influenza from Nasopharyngeal swab specimens and  should not be used as a sole basis for treatment. Nasal washings and  aspirates are unacceptable for Xpert Xpress SARS-CoV-2/FLU/RSV  testing.  Fact Sheet for Patients: PinkCheek.be  Fact Sheet for Healthcare Providers: GravelBags.it  This test is not yet approved or cleared by the [place] FDA and  has been authorized for detection and/or diagnosis of SARS-CoV-2 by  FDA under an Emergency Use Authorization (EUA). This EUA will remain  in effect (meaning  this test can be used) for the duration of the  Covid-19 declaration under Section 564(b)(1) of the Act, 21  U.S.C. section 360bbb-3(b)(1), unless the authorization is  terminated or revoked. Performed at [facility], [address], [place], [place] [address]   MRSA PCR Screening     Status: None   Collection Time: 09/16/20  9:45 AM   Specimen: Nasal Mucosa; Nasopharyngeal  Result Value Ref Range Status   MRSA by PCR NEGATIVE NEGATIVE Final    Comment:        The GeneXpert MRSA Assay (FDA approved for NASAL specimens only), is one component of a comprehensive  MRSA colonization surveillance program. It is not intended to diagnose MRSA infection nor to guide or monitor treatment for MRSA infections. Performed at [facility], [address], [place], [place] [address]   C Difficile Quick Screen w PCR reflex     Status: None   Collection Time: 09/17/20  3:54 PM   Specimen: STOOL  Result Value Ref Range Status   C Diff antigen NEGATIVE NEGATIVE Final   C Diff toxin NEGATIVE NEGATIVE Final   C Diff interpretation No C. difficile detected.  Final    Comment: Performed at [facility], [address], [place], [place] [address]     Labs: BNP (last 3 results) Recent Labs    09/16/20 0203 09/19/20 0420  BNP 110.0* 101.7*   Basic Metabolic Panel: Recent Labs  Lab 09/17/20 0600 09/17/20 0600 09/18/20 0528 09/18/20 0528 09/19/20 0420 09/19/20 0420 09/20/20 0313 09/20/20 1049 09/20/20 1050 09/20/20 1606 09/21/20 0134 09/22/20 0141  NA 139   < > 139   < > 140   < > 140 141 141  --  140 138  K 4.1   < > 3.5   < > 3.5   < > 3.7 3.9 3.9  --  3.8 3.5  CL 106   < > 105  --  110  --  107  --   --   --  106 104  CO2 20*   < > 20*  --  21*  --  21*  --   --   --  23 23  GLUCOSE 203*   < > 171*  --  97  --  118*  --   --   --  99 127*  BUN 17   < > 12  --  13  --  13  --   --   --  10 14  CREATININE 0.89   < > 0.78   < > 0.73  --  0.63  --   --  0.66 0.75 0.77  CALCIUM 8.2*   <  > 8.5*  --  8.1*  --  8.6*  --   --   --  9.2 9.0  MG 1.8  --   --   --   --   --   --   --   --   --   --   --    < > = values in this interval not displayed.   Liver Function Tests: Recent Labs  Lab 09/18/20 0528 09/19/20 0420 09/20/20 0313 09/21/20 0134 09/22/20 0141  AST 201* 62* 38 36 24  ALT 508* 275* 192* 160* 105*  ALKPHOS 111 131* 161* 171* 145*  BILITOT 1.2 0.8 0.8 0.9 0.7  PROT 6.7 6.1* 6.4* 6.4* 6.2*  ALBUMIN 2.9* 2.6* 2.7* 2.8* 2.7*   No results for input(s): LIPASE, AMYLASE in the last 168 hours. No results for input(s): AMMONIA in the last 168 hours. CBC: Recent Labs  Lab 09/16/20 0203 09/16/20 0203 09/17/20 0600 09/17/20 1014 09/19/20 0420 09/19/20 0420 09/20/20 0313 09/20/20 0313 09/20/20 1049 09/20/20 1050 09/20/20 1606 09/21/20 0134 09/22/20 0141  WBC 11.3*   < > 11.4*   < > 8.4  --  7.4  --   --   --  7.5 9.4 9.7  NEUTROABS 4.8  --  8.6*  --   --   --   --   --   --   --   --   --   --  HGB 15.0   < > 12.5   < > 10.6*   < > 11.0*   < > 10.5* 10.5* 10.8* 12.1 11.7*  HCT 48.2*   < > 40.5   < > 35.6*   < > 35.1*   < > 31.0* 31.0* 34.5* 38.9 37.0  MCV 88.8   < > 88.6   < > 90.6  --  87.1  --   --   --  86.0 87.0 84.9  PLT 291   < > 145*   < > 133*  --  162  --   --   --  184 219 242   < > = values in this interval not displayed.   Cardiac Enzymes: No results for input(s): CKTOTAL, CKMB, CKMBINDEX, TROPONINI in the last 168 hours. BNP: Invalid input(s): POCBNP CBG: Recent Labs  Lab 09/21/20 0807 09/21/20 1135 09/21/20 1620 09/21/20 2035 09/22/20 0753  GLUCAP 94 131* 104* 155* 129*   D-Dimer No results for input(s): DDIMER in the last 72 hours. Hgb A1c No results for input(s): HGBA1C in the last 72 hours. Lipid Profile Recent Labs    09/21/20 0134  CHOL 104  HDL 18*  LDLCALC 52  TRIG 170*  CHOLHDL 5.8   Thyroid function studies No results for input(s): TSH, T4TOTAL, T3FREE, THYROIDAB in the last 72 hours.  Invalid input(s):  FREET3 Anemia work up No results for input(s): VITAMINB12, FOLATE, FERRITIN, TIBC, IRON, RETICCTPCT in the last 72 hours. Urinalysis    Component Value Date/Time   COLORURINE YELLOW 09/16/2020 0235   APPEARANCEUR HAZY (A) 09/16/2020 0235   LABSPEC 1.015 09/16/2020 0235   PHURINE 6.0 09/16/2020 0235   GLUCOSEU >=500 (A) 09/16/2020 0235   HGBUR SMALL (A) 09/16/2020 0235   BILIRUBINUR NEGATIVE 09/16/2020 0235   KETONESUR NEGATIVE 09/16/2020 0235   PROTEINUR NEGATIVE 09/16/2020 0235   UROBILINOGEN 0.2 05/22/2012 1256   NITRITE POSITIVE (A) 09/16/2020 0235   LEUKOCYTESUR MODERATE (A) 09/16/2020 0235   Sepsis Labs Invalid input(s): PROCALCITONIN,  WBC,  LACTICIDVEN Microbiology Recent Results (from the past 240 hour(s))  Respiratory Panel by RT PCR (Flu A&B, Covid) - Nasopharyngeal Swab     Status: None   Collection Time: 09/16/20  2:12 AM   Specimen: Nasopharyngeal Swab  Result Value Ref Range Status   SARS Coronavirus 2 by RT PCR NEGATIVE NEGATIVE Final    Comment: (NOTE) SARS-CoV-2 target nucleic acids are NOT DETECTED.  The SARS-CoV-2 RNA is generally detectable in upper respiratoy specimens during the acute phase of infection. The lowest concentration of SARS-CoV-2 viral copies this assay can detect is 131 copies/mL. A negative result does not preclude SARS-Cov-2 infection and should not be used as the sole basis for treatment or other patient management decisions. A negative result may occur with  improper specimen collection/handling, submission of specimen other than nasopharyngeal swab, presence of viral mutation(s) within the areas targeted by this assay, and inadequate number of viral copies (<131 copies/mL). A negative result must be combined with clinical observations, patient history, and epidemiological information. The expected result is Negative.  Fact Sheet for Patients:  PinkCheek.be  Fact Sheet for Healthcare Providers:   GravelBags.it  This test is no t yet approved or cleared by the [place] FDA and  has been authorized for detection and/or diagnosis of SARS-CoV-2 by FDA under an Emergency Use Authorization (EUA). This EUA will remain  in effect (meaning this test can be used) for the duration of the COVID-19 declaration under Section  564(b)(1) of the Act, 21 U.S.C. section 360bbb-3(b)(1), unless the authorization is terminated or revoked sooner.     Influenza A by PCR NEGATIVE NEGATIVE Final   Influenza B by PCR NEGATIVE NEGATIVE Final    Comment: (NOTE) The Xpert Xpress SARS-CoV-2/FLU/RSV assay is intended as an aid in  the diagnosis of influenza from Nasopharyngeal swab specimens and  should not be used as a sole basis for treatment. Nasal washings and  aspirates are unacceptable for Xpert Xpress SARS-CoV-2/FLU/RSV  testing.  Fact Sheet for Patients: PinkCheek.be  Fact Sheet for Healthcare Providers: GravelBags.it  This test is not yet approved or cleared by the [place] FDA and  has been authorized for detection and/or diagnosis of SARS-CoV-2 by  FDA under an Emergency Use Authorization (EUA). This EUA will remain  in effect (meaning this test can be used) for the duration of the  Covid-19 declaration under Section 564(b)(1) of the Act, 21  U.S.C. section 360bbb-3(b)(1), unless the authorization is  terminated or revoked. Performed at [facility], [address] [place], [place] [address]   Urine culture     Status: Abnormal   Collection Time: 09/16/20  2:35 AM   Specimen: Urine, Catheterized  Result Value Ref Range Status   Specimen Description   Final    URINE, CATHETERIZED Performed at [facility], [address] [place], [place] [address]    Special Requests   Final    NONE Performed at [facility], [address], [place], [place] [address]    Culture >=100,000 COLONIES/mL  ESCHERICHIA COLI (A)  Final   Report Status 09/18/2020 FINAL  Final   Organism ID, Bacteria ESCHERICHIA COLI (A)  Final      Susceptibility   Escherichia coli - MIC*    AMPICILLIN 4 SENSITIVE Sensitive     CEFAZOLIN <=4 SENSITIVE Sensitive     CEFTRIAXONE <=0.25 SENSITIVE Sensitive     CIPROFLOXACIN <=0.25 SENSITIVE Sensitive     GENTAMICIN <=1 SENSITIVE Sensitive     IMIPENEM <=0.25 SENSITIVE Sensitive     NITROFURANTOIN <=16 SENSITIVE Sensitive     TRIMETH/SULFA <=20 SENSITIVE Sensitive     AMPICILLIN/SULBACTAM <=2 SENSITIVE Sensitive     PIP/TAZO <=4 SENSITIVE Sensitive     * >=100,000 COLONIES/mL ESCHERICHIA COLI  Blood Culture (routine x 2)     Status: Abnormal   Collection Time: 09/16/20  2:52 AM   Specimen: Left Antecubital; Blood  Result Value Ref Range Status   Specimen Description   Final    LEFT ANTECUBITAL Performed at [facility], [address] [place], [place] [address]    Special Requests   Final    BOTTLES DRAWN AEROBIC AND ANAEROBIC Blood Culture adequate volume Performed at [facility], [address] [place], [place] [address]    Culture  Setup Time   Final    GRAM NEGATIVE RODS CRITICAL RESULT CALLED TO, READ BACK BY AND VERIFIED WITH: RN C ALSTON [id number] AT 859 AM BY CM Performed at [facility], [place] [address] [place], [place] [address]    Culture ESCHERICHIA COLI (A)  Final   Report Status 09/20/2020 FINAL  Final   Organism ID, Bacteria ESCHERICHIA COLI  Final      Susceptibility   Escherichia coli - MIC*    AMPICILLIN 4 SENSITIVE Sensitive     CEFAZOLIN <=4 SENSITIVE Sensitive     CEFEPIME <=0.12 SENSITIVE Sensitive     CEFTAZIDIME <=1 SENSITIVE Sensitive     CEFTRIAXONE <=0.25 SENSITIVE Sensitive  CIPROFLOXACIN <=0.25 SENSITIVE Sensitive     GENTAMICIN <=1 SENSITIVE Sensitive     IMIPENEM <=0.25 SENSITIVE Sensitive     TRIMETH/SULFA <=20 SENSITIVE Sensitive     AMPICILLIN/SULBACTAM <=2 SENSITIVE Sensitive     PIP/TAZO <=4 SENSITIVE  Sensitive     * ESCHERICHIA COLI  Blood Culture ID Panel (Reflexed)     Status: Abnormal   Collection Time: 09/16/20  2:52 AM  Result Value Ref Range Status   Enterococcus faecalis NOT DETECTED NOT DETECTED Final   Enterococcus Faecium NOT DETECTED NOT DETECTED Final   Listeria monocytogenes NOT DETECTED NOT DETECTED Final   Staphylococcus species NOT DETECTED NOT DETECTED Final   Staphylococcus aureus (BCID) NOT DETECTED NOT DETECTED Final   Staphylococcus epidermidis NOT DETECTED NOT DETECTED Final   Staphylococcus lugdunensis NOT DETECTED NOT DETECTED Final   Streptococcus species NOT DETECTED NOT DETECTED Final   Streptococcus agalactiae NOT DETECTED NOT DETECTED Final   Streptococcus pneumoniae NOT DETECTED NOT DETECTED Final   Streptococcus pyogenes NOT DETECTED NOT DETECTED Final   A.calcoaceticus-baumannii NOT DETECTED NOT DETECTED Final   Bacteroides fragilis NOT DETECTED NOT DETECTED Final   Enterobacterales DETECTED (A) NOT DETECTED Final    Comment: Enterobacterales represent a large order of gram negative bacteria, not a single organism. CRITICAL RESULT CALLED TO, READ BACK BY AND VERIFIED WITH: RN C ALSTON [id number] AT 900 AM BY CM    Enterobacter cloacae complex NOT DETECTED NOT DETECTED Final   Escherichia coli DETECTED (A) NOT DETECTED Final    Comment: CRITICAL RESULT CALLED TO, READ BACK BY AND VERIFIED WITH: RN C ALSTON [id number] AT 900 AM BY CM    Klebsiella aerogenes NOT DETECTED NOT DETECTED Final   Klebsiella oxytoca NOT DETECTED NOT DETECTED Final   Klebsiella pneumoniae NOT DETECTED NOT DETECTED Final   Proteus species NOT DETECTED NOT DETECTED Final   Salmonella species NOT DETECTED NOT DETECTED Final   Serratia marcescens NOT DETECTED NOT DETECTED Final   Haemophilus influenzae NOT DETECTED NOT DETECTED Final   Neisseria meningitidis NOT DETECTED NOT DETECTED Final   Pseudomonas aeruginosa NOT DETECTED NOT DETECTED Final   Stenotrophomonas maltophilia NOT  DETECTED NOT DETECTED Final   Candida albicans NOT DETECTED NOT DETECTED Final   Candida auris NOT DETECTED NOT DETECTED Final   Candida glabrata NOT DETECTED NOT DETECTED Final   Candida krusei NOT DETECTED NOT DETECTED Final   Candida parapsilosis NOT DETECTED NOT DETECTED Final   Candida tropicalis NOT DETECTED NOT DETECTED Final   Cryptococcus neoformans/gattii NOT DETECTED NOT DETECTED Final   CTX-M ESBL NOT DETECTED NOT DETECTED Final   Carbapenem resistance IMP NOT DETECTED NOT DETECTED Final   Carbapenem resistance KPC NOT DETECTED NOT DETECTED Final   Carbapenem resistance NDM NOT DETECTED NOT DETECTED Final   Carbapenem resist OXA 48 LIKE NOT DETECTED NOT DETECTED Final   Carbapenem resistance VIM NOT DETECTED NOT DETECTED Final    Comment: Performed at [facility], [address], [place], [place] [address]  Blood Culture (routine x 2)     Status: None   Collection Time: 09/16/20  3:09 AM   Specimen: BLOOD LEFT HAND  Result Value Ref Range Status   Specimen Description BLOOD LEFT HAND  Final   Special Requests   Final    BOTTLES DRAWN AEROBIC AND ANAEROBIC Blood Culture adequate volume   Culture   Final    NO GROWTH 5 DAYS Performed at [facility], [address], [place], [place] [address]  Report Status 09/21/2020 FINAL  Final  Respiratory Panel by RT PCR (Flu A&B, Covid) - Nasopharyngeal Swab     Status: None   Collection Time: 09/16/20  6:03 AM   Specimen: Nasopharyngeal Swab  Result Value Ref Range Status   SARS Coronavirus 2 by RT PCR NEGATIVE NEGATIVE Final    Comment: (NOTE) SARS-CoV-2 target nucleic acids are NOT DETECTED.  The SARS-CoV-2 RNA is generally detectable in upper respiratoy specimens during the acute phase of infection. The lowest concentration of SARS-CoV-2 viral copies this assay can detect is 131 copies/mL. A negative result does not preclude SARS-Cov-2 infection and should not be used as the sole basis for treatment or other patient  management decisions. A negative result may occur with  improper specimen collection/handling, submission of specimen other than nasopharyngeal swab, presence of viral mutation(s) within the areas targeted by this assay, and inadequate number of viral copies (<131 copies/mL). A negative result must be combined with clinical observations, patient history, and epidemiological information. The expected result is Negative.  Fact Sheet for Patients:  PinkCheek.be  Fact Sheet for Healthcare Providers:  GravelBags.it  This test is no t yet approved or cleared by the [place] FDA and  has been authorized for detection and/or diagnosis of SARS-CoV-2 by FDA under an Emergency Use Authorization (EUA). This EUA will remain  in effect (meaning this test can be used) for the duration of the COVID-19 declaration under Section 564(b)(1) of the Act, 21 U.S.C. section 360bbb-3(b)(1), unless the authorization is terminated or revoked sooner.     Influenza A by PCR NEGATIVE NEGATIVE Final   Influenza B by PCR NEGATIVE NEGATIVE Final    Comment: (NOTE) The Xpert Xpress SARS-CoV-2/FLU/RSV assay is intended as an aid in  the diagnosis of influenza from Nasopharyngeal swab specimens and  should not be used as a sole basis for treatment. Nasal washings and  aspirates are unacceptable for Xpert Xpress SARS-CoV-2/FLU/RSV  testing.  Fact Sheet for Patients: PinkCheek.be  Fact Sheet for Healthcare Providers: GravelBags.it  This test is not yet approved or cleared by the [place] FDA and  has been authorized for detection and/or diagnosis of SARS-CoV-2 by  FDA under an Emergency Use Authorization (EUA). This EUA will remain  in effect (meaning this test can be used) for the duration of the  Covid-19 declaration under Section 564(b)(1) of the Act, 21  U.S.C. section 360bbb-3(b)(1),  unless the authorization is  terminated or revoked. Performed at [facility], [address], [place], [place] [address]   MRSA PCR Screening     Status: None   Collection Time: 09/16/20  9:45 AM   Specimen: Nasal Mucosa; Nasopharyngeal  Result Value Ref Range Status   MRSA by PCR NEGATIVE NEGATIVE Final    Comment:        The GeneXpert MRSA Assay (FDA approved for NASAL specimens only), is one component of a comprehensive MRSA colonization surveillance program. It is not intended to diagnose MRSA infection nor to guide or monitor treatment for MRSA infections. Performed at [facility], [address] [place], [place] [address]   C Difficile Quick Screen w PCR reflex     Status: None   Collection Time: 09/17/20  3:54 PM   Specimen: STOOL  Result Value Ref Range Status   C Diff antigen NEGATIVE NEGATIVE Final   C Diff toxin NEGATIVE NEGATIVE Final   C Diff interpretation No C. difficile detected.  Final    Comment: Performed at [facility],  [address], [place], [place] [address]     Time coordinating discharge: Over 30 minutes  SIGNED:   Charlynne Cousins, MD  [facility] 09/22/2020, 9:31 AM Pager   If 7PM-7AM, please contact night-coverage [email or web address] Password [email or web address]

## 2020-09-22 NOTE — Progress Notes (Signed)
Nutrition Education Note  RD consulted for nutrition education regarding heart health and diabetes.  Spoke with patient over the phone. She was working with Cardiac Rehab, so conversation was kept brief. Returned call to patient after Cardiac Rehab with no answer. Plans for D/C home today.  RD attached "Heart Healthy, Consistent Carbohydrate Nutrition Therapy" handout from the [employer] of [employer] to discharge instructions.    Lab Results  Component Value Date   HGBA1C 11.1 (H) 09/16/2020     Body mass index is 37.25 kg/m. Pt meets criteria for class 2 obesity based on current BMI.  Current diet order is heart healthy, patient is consuming approximately 100% of meals at this time. Labs and medications reviewed. No further nutrition interventions warranted at this time. If additional nutrition issues arise, please re-consult RD.   Yolanda [name], RD, LDN, CNSC Please refer to [facility] for contact information.

## 2020-09-22 NOTE — Progress Notes (Addendum)
The patient has been seen in conjunction with Reino Bellis, NP. All aspects of care have been considered and discussed. The patient has been personally interviewed, examined, and all clinical data has been reviewed.   Agree with discharge today and contents of this note.   Progress Note  Patient Name: Yolanda [name] Date of Encounter: 09/22/2020  [facility] Cardiologist: Carlyle Dolly, MD   Subjective   Feeling well this morning. Planned for discharge today.   Inpatient Medications    Scheduled Meds: . amoxicillin-clavulanate  1 tablet Oral Q12H  . aspirin EC  81 mg Oral Daily  . atorvastatin  40 mg Oral QHS  . busPIRone  15 mg Oral BID  . Chlorhexidine Gluconate Cloth  6 each Topical Daily  . enoxaparin (LOVENOX) injection  40 mg Subcutaneous Q24H  . fluconazole  100 mg Oral Daily  . insulin aspart  0-20 Units Subcutaneous TID WC  . insulin aspart  0-5 Units Subcutaneous QHS  . insulin glargine  70 Units Subcutaneous Q24H  . losartan  12.5 mg Oral Daily  . metoprolol succinate  25 mg Oral Daily  . mirabegron ER  50 mg Oral Daily  . pantoprazole  40 mg Oral Daily  . PARoxetine  60 mg Oral Daily  . pregabalin  300 mg Oral Q12H  . QUEtiapine  100 mg Oral QHS  . sodium chloride flush  3 mL Intravenous Q12H  . sodium chloride flush  3 mL Intravenous Q12H  . spironolactone  12.5 mg Oral Daily   Continuous Infusions: . sodium chloride    . insulin Stopped (09/16/20 2000)   PRN Meds: sodium chloride, acetaminophen **OR** acetaminophen, albuterol, alum & mag hydroxide-simeth, clonazePAM, dextrose, famotidine, ondansetron **OR** ondansetron (ZOFRAN) IV, oxyCODONE, polyethylene glycol, sodium chloride flush   Vital Signs    Vitals:   09/21/20 1357 09/21/20 1956 09/22/20 0335 09/22/20 0800  BP: 125/86  101/71 116/76  Pulse:    95  Resp: 16 18 18 18   Temp: 98.2 F (36.8 C) 98.7 F (37.1 C) 98.7 F (37.1 C) 98.2 F (36.8 C)  TempSrc: Oral Oral Oral Oral    SpO2:   92% 93%  Weight:   107.9 kg   Height:        Intake/Output Summary (Last 24 hours) at 09/22/2020 0824 Last data filed at 09/21/2020 2000 Gross per 24 hour  Intake 1134 ml  Output --  Net 1134 ml   Last 3 Weights 09/22/2020 09/18/2020 09/16/2020  Weight (lbs) 237 lb 13 oz 239 lb 13.8 oz 236 lb 8.9 oz  Weight (kg) 107.87 kg 108.8 kg 107.3 kg      Telemetry    SR - Personally Reviewed  ECG    No new tracing this morning  Physical Exam  Pleasant female, sitting up in bed GEN: No acute distress.   Neck: No JVD Cardiac: RRR, no murmurs, rubs, or gallops.  Respiratory: Clear to auscultation bilaterally. GI: Soft, nontender, non-distended  MS: No edema; No deformity. Right radial cath site stable.  Neuro:  Nonfocal  Psych: Normal affect   Labs    High Sensitivity Troponin:   Recent Labs  Lab 09/16/20 1900 09/16/20 2059 09/17/20 1634 09/17/20 2247 09/18/20 0528  TROPONINIHS 2,445* 2,886* 1,865* 1,429* 1,416*      Chemistry Recent Labs  Lab 09/20/20 0313 09/20/20 0313 09/20/20 1049 09/20/20 1050 09/20/20 1606 09/21/20 0134 09/22/20 0141  NA 140  --    < > 141  --  140  138  K 3.7  --    < > 3.9  --  3.8 3.5  CL 107  --   --   --   --  106 104  CO2 21*  --   --   --   --  23 23  GLUCOSE 118*  --   --   --   --  99 127*  BUN 13  --   --   --   --  10 14  CREATININE 0.63   < >  --   --  0.66 0.75 0.77  CALCIUM 8.6*  --   --   --   --  9.2 9.0  PROT 6.4*  --   --   --   --  6.4* 6.2*  ALBUMIN 2.7*  --   --   --   --  2.8* 2.7*  AST 38  --   --   --   --  36 24  ALT 192*  --   --   --   --  160* 105*  ALKPHOS 161*  --   --   --   --  171* 145*  BILITOT 0.8  --   --   --   --  0.9 0.7  GFRNONAA >60   < >  --   --  >60 >60 >60  ANIONGAP 12  --   --   --   --  11 11   < > = values in this interval not displayed.     Hematology Recent Labs  Lab 09/20/20 1606 09/21/20 0134 09/22/20 0141  WBC 7.5 9.4 9.7  RBC 4.01 4.47 4.36  HGB 10.8* 12.1  11.7*  HCT 34.5* 38.9 37.0  MCV 86.0 87.0 84.9  MCH 26.9 27.1 26.8  MCHC 31.3 31.1 31.6  RDW 15.6* 15.7* 15.6*  PLT 184 219 242    BNP Recent Labs  Lab 09/16/20 0203 09/19/20 0420  BNP 110.0* 345.0*     DDimer  Recent Labs  Lab 09/16/20 0203  DDIMER 9.53*     Radiology    CARDIAC CATHETERIZATION  Result Date: 09/20/2020 1.  Moderate coronary artery disease involving the distal RCA 2.  Mild nonobstructive disease involving the LAD 3.  Patent left main and left circumflex with no significant stenosis 4.  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  Should have repeat echocardiogram within 3 months to assess for LV recovery.   Cardiac Studies   Echo: 09/16/20  IMPRESSIONS    1. There is severe hypokinesis of the mid-to-distal anterolateral,  septal, and anterior LV segments. The apex appears akinetic. Pattern  concerning for wrap-around LAD lesion versus possible takotsubo  cardiomyopathy.. Left ventricular ejection fraction,  by estimation, is 20 to 25%. The left ventricle has severely decreased  function. There is mild concentric left ventricular hypertrophy. Left  ventricular diastolic parameters are indeterminate.  2. Right ventricular systolic function is mildly reduced. The right  ventricular size is normal.  3. The mitral valve is normal in structure. Trivial mitral valve  regurgitation.  4. The aortic valve was not well visualized. Aortic valve regurgitation  is not visualized.  5. The inferior vena cava is dilated in size with <50% respiratory  variability, suggesting right atrial pressure of 15 mmHg.   Cath: 09/20/20  1. Moderate coronary artery disease  involving the distal RCA 2. Mild nonobstructive disease involving the LAD 3. Patent left main and left  circumflex with no significant stenosis 4. 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 Should have repeat echocardiogram within 3 months to assess for LV recovery.  Diagnostic Dominance: Right   Patient Profile     49 y.o. female w/ PMH of HTN, HLD, Type 2 DM and GERD who is currently admitted for sepsis secondary to PNA and UTI. Cardiology consulted for elevated troponin and new cardiomyopathy with EF at 20-25%.  Assessment & Plan    1.NSTEMI/Acute Systolic CHF: HS Troponin values peaked at 2886 this admission in the setting of severe sepsis, lactic acidosis and DKA. Echo showed a reduced EF of 20-25% with the apex appearing akinetic as outlined above and concerning for LAD lesion versus takotsubo cardiomyopathy.  -- underwent cardiac cath noted above with mild to moderate nonobstructive disease with recommendations for medical management. 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 -- will continue on ASA, statin, BB, spiro and ARB. No chest pain overnight. Stable telemetry. -- further titrate BP meds as an outpatient as tolerated. Consider transition to [facility] as an outpatient.  -- would give 36m lasix PRN at discharge -- will order CR to see as well  2.SevereSepsis: Felt to be secondary to PNAvs UTI. Lactic Acid peaked at 4.7 but has now normalized.She remains on Augmentin. -- Further management per admitting team.   3. DKA: Now resolved. Hgb A1c at 11.1. States she has asked to be referred to endocrinology but PCP had not referred. I encouraged her to revisit this given she is on multi agents with uncontrolled Hgb A1c.  -- continue insulin regimen, Invokamet, ozempic  4. Elevated LFT's: AST 925 and ALT 756 on admission, now improved. -- on atorva 450mdaily will further increase to 8039mdaily -- FLP/LFTs in 8 weeks   For questions or updates, please contact [facility] consult [email or web address] for contact info under        Signed, LinReino BellisP  09/22/2020, 8:24 AM

## 2020-09-22 NOTE — Discharge Instructions (Signed)
Losartan Tablets What is this medicine? LOSARTAN (loe SAR tan) is an angiotensin II receptor blocker, also known as an ARB. It treats high blood pressure. It can slow kidney damage in some patients. It may also be used to lower the risk of stroke. This medicine may be used for other purposes; ask your health care provider or pharmacist if you have questions. COMMON BRAND NAME(S): Cozaar What should I tell my health care provider before I take this medicine? They need to know if you have any of these conditions:  heart failure  kidney or liver disease  an unusual or allergic reaction to losartan, other medicines, foods, dyes, or preservatives  pregnant or trying to get pregnant  breast-feeding How should I use this medicine? Take this drug by mouth. Take it as directed on the prescription label at the same time every day. You can take it with or without food. If it upsets your stomach, take it with food. Keep taking it unless your health care provider tells you to stop. Talk to your health care provider about the use of this drug in children. While it may be prescribed for children as young as 6 for selected conditions, precautions do apply. Overdosage: If you think you have taken too much of this medicine contact a poison control center or emergency room at once. NOTE: This medicine is only for you. Do not share this medicine with others. What if I miss a dose? If you miss a dose, take it as soon as you can. If it is almost time for your next dose, take only that dose. Do not take double or extra doses. What may interact with this medicine?  blood pressure medicines  diuretics, especially triamterene, spironolactone, or amiloride  fluconazole  NSAIDs, medicines for pain and inflammation, like ibuprofen or naproxen  potassium salts or potassium supplements  rifampin This list may not describe all possible interactions. Give your health care provider a list of all the medicines,  herbs, non-prescription drugs, or dietary supplements you use. Also tell them if you smoke, drink alcohol, or use illegal drugs. Some items may interact with your medicine. What should I watch for while using this medicine? Visit your doctor or health care professional for regular checks on your progress. Check your blood pressure as directed. Ask your doctor or health care professional what your blood pressure should be and when you should contact him or her. Call your doctor or health care professional if you notice an irregular or fast heart beat. Women should inform their doctor if they wish to become pregnant or think they might be pregnant. There is a potential for serious side effects to an unborn child, particularly in the second or third trimester. Talk to your health care professional or pharmacist for more information. You may get drowsy or dizzy. Do not drive, use machinery, or do anything that needs mental alertness until you know how this drug affects you. Do not stand or sit up quickly, especially if you are an older patient. This reduces the risk of dizzy or fainting spells. Alcohol can make you more drowsy and dizzy. Avoid alcoholic drinks. Avoid salt substitutes unless you are told otherwise by your doctor or health care professional. Do not treat yourself for coughs, colds, or pain while you are taking this medicine without asking your doctor or health care professional for advice. Some ingredients may increase your blood pressure. What side effects may I notice from receiving this medicine? Side effects that you  should report to your doctor or health care professional as soon as possible:  confusion, dizziness, light headedness or fainting spells  decreased amount of urine passed  difficulty breathing or swallowing, hoarseness, or tightening of the throat  fast or irregular heart beat, palpitations, or chest pain  skin rash, itching  swelling of your face, lips, tongue, hands,  or feet Side effects that usually do not require medical attention (report to your doctor or health care professional if they continue or are bothersome):  cough  decreased sexual function or desire  headache  nasal congestion or stuffiness  nausea or stomach pain  sore or cramping muscles This list may not describe all possible side effects. Call your doctor for medical advice about side effects. You may report side effects to FDA at 1-800-FDA-1088. Where should I keep my medicine? Keep out of the reach of children and pets. Store at room temperature between 15 and 30 degrees C (59 and 86 degrees F). Protect from light. Keep the container tightly closed. Throw away any unused drug after the expiration date. NOTE: This sheet is a summary. It may not cover all possible information. If you have questions about this medicine, talk to your doctor, pharmacist, or health care provider.  2020 Elsevier/Gold Standard (2019-06-23 12:12:28) Metoprolol Tablets What is this medicine? METOPROLOL (me TOE proe lole) is a beta blocker. It decreases the amount of work your heart has to do and helps your heart beat regularly. It is used to treat high blood pressure and/or prevent chest pain (also called angina). It is also used after a heart attack to prevent a second one. This medicine may be used for other purposes; ask your health care provider or pharmacist if you have questions. COMMON BRAND NAME(S): Lopressor What should I tell my health care provider before I take this medicine? They need to know if you have any of these conditions:  diabetes  heart or vessel disease like slow heart rate, worsening heart failure, heart block, sick sinus syndrome or Raynaud's disease  kidney disease  liver disease  lung or breathing disease, like asthma or emphysema  pheochromocytoma  thyroid disease  an unusual or allergic reaction to metoprolol, other beta-blockers, medicines, foods, dyes, or  preservatives  pregnant or trying to get pregnant  breast-feeding How should I use this medicine? Take this drug by mouth with water. Take it as directed on the prescription label at the same time every day. You can take it with or without food. You should always take it the same way. Keep taking it unless your health care provider tells you to stop. Talk to your health care provider about the use of this drug in children. Special care may be needed. Overdosage: If you think you have taken too much of this medicine contact a poison control center or emergency room at once. NOTE: This medicine is only for you. Do not share this medicine with others. What if I miss a dose? If you miss a dose, take it as soon as you can. If it is almost time for your next dose, take only that dose. Do not take double or extra doses. What may interact with this medicine? This medicine may interact with the following medications:  certain medicines for blood pressure, heart disease, irregular heart beat  certain medicines for depression like monoamine oxidase (MAO) inhibitors, fluoxetine, or paroxetine  clonidine  dobutamine  epinephrine  isoproterenol  reserpine This list may not describe all possible interactions. Give your  health care provider a list of all the medicines, herbs, non-prescription drugs, or dietary supplements you use. Also tell them if you smoke, drink alcohol, or use illegal drugs. Some items may interact with your medicine. What should I watch for while using this medicine? Visit your doctor or health care professional for regular check ups. Contact your doctor right away if your symptoms worsen. Check your blood pressure and pulse rate regularly. Ask your health care professional what your blood pressure and pulse rate should be, and when you should contact them. You may get drowsy or dizzy. Do not drive, use machinery, or do anything that needs mental alertness until you know how this  medicine affects you. Do not sit or stand up quickly, especially if you are an older patient. This reduces the risk of dizzy or fainting spells. Contact your doctor if these symptoms continue. Alcohol may interfere with the effect of this medicine. Avoid alcoholic drinks. This medicine may increase blood sugar. Ask your healthcare provider if changes in diet or medicines are needed if you have diabetes. What side effects may I notice from receiving this medicine? Side effects that you should report to your doctor or health care professional as soon as possible:  allergic reactions like skin rash, itching or hives  cold or numb hands or feet  depression  difficulty breathing  faint  fever with sore throat  irregular heartbeat, chest pain  rapid weight gain   signs and symptoms of high blood sugar such as being more thirsty or hungry or having to urinate more than normal. You may also feel very tired or have blurry vision.  swollen legs or ankles Side effects that usually do not require medical attention (report to your doctor or health care professional if they continue or are bothersome):  anxiety or nervousness  change in sex drive or performance  dry skin  headache  nightmares or trouble sleeping  short term memory loss  stomach upset or diarrhea This list may not describe all possible side effects. Call your doctor for medical advice about side effects. You may report side effects to FDA at 1-800-FDA-1088. Where should I keep my medicine? Keep out of the reach of children and pets. Store at room temperature between 15 and 30 degrees C (59 and 86 degrees F). Protect from moisture. Keep the container tightly closed. Throw away any unused drug after the expiration date. NOTE: This sheet is a summary. It may not cover all possible information. If you have questions about this medicine, talk to your doctor, pharmacist, or health care provider.  2020 Elsevier/Gold Standard  (2019-07-01 17:21:17) Spironolactone Oral Tablets What is this medicine? SPIRONOLACTONE ([place] LAK tone) is a diuretic. It helps you make more urine and to lose excess water from your body. This medicine is used to treat high blood pressure, and edema or swelling from heart, kidney, or liver disease. It is also used to treat patients who make too much aldosterone or have low potassium. This medicine may be used for other purposes; ask your health care provider or pharmacist if you have questions. COMMON BRAND NAME(S): Aldactone What should I tell my health care provider before I take this medicine? They need to know if you have any of these conditions:  high blood level of potassium  kidney disease or trouble making urine  liver disease  an unusual or allergic reaction to spironolactone, other medicines, foods, dyes, or preservatives  pregnant or trying to get pregnant  breast-feeding How should I use this medicine? Take this drug by mouth. Take it as directed on the prescription label at the same time every day. You can take it with or without food. You should always take it the same way. Keep taking it unless your health care provider tells you to stop. Talk to your health care provider about the use of this drug in children. Special care may be needed. Overdosage: If you think you have taken too much of this medicine contact a poison control center or emergency room at once. NOTE: This medicine is only for you. Do not share this medicine with others. What if I miss a dose? If you miss a dose, take it as soon as you can. If it is almost time for your next dose, take only that dose. Do not take double or extra doses. What may interact with this medicine? Do not take this medicine with any of the following medications:  cidofovir  eplerenone  tranylcypromine This medicine may also interact with the following medications:  aspirin  certain medicines for blood pressure or  heart disease like benazepril, lisinopril, losartan, valsartan  certain medicines that treat or prevent blood clots like heparin and enoxaparin  cholestyramine  cyclosporine  digoxin  lithium  medicines that relax muscles for surgery  NSAIDs, medicines for pain and inflammation, like ibuprofen or naproxen  other diuretics  potassium supplements  steroid medicines like prednisone or cortisone  trimethoprim This list may not describe all possible interactions. Give your health care provider a list of all the medicines, herbs, non-prescription drugs, or dietary supplements you use. Also tell them if you smoke, drink alcohol, or use illegal drugs. Some items may interact with your medicine. What should I watch for while using this medicine? Visit your doctor or health care professional for regular checks on your progress. Check your blood pressure as directed. Ask your doctor what your blood pressure should be, and when you should contact them. You may need to be on a special diet while taking this medicine. Ask your doctor. Also, ask how many glasses of fluid you need to drink a day. You must not get dehydrated. This medicine may make you feel confused, dizzy or lightheaded. Drinking alcohol and taking some medicines can make this worse. Do not drive, use machinery, or do anything that needs mental alertness until you know how this medicine affects you. Do not sit or stand up quickly. What side effects may I notice from receiving this medicine? Side effects that you should report to your doctor or health care professional as soon as possible:  allergic reactions such as skin rash or itching, hives, swelling of the lips, mouth, tongue, or throat  black or tarry stools  fast, irregular heartbeat  fever  muscle pain, cramps  numbness, tingling in hands or feet  trouble breathing  trouble passing urine  unusual bleeding  unusually weak or tired Side effects that usually do  not require medical attention (report to your doctor or health care professional if they continue or are bothersome):  change in voice or hair growth  confusion  dizzy, drowsy  dry mouth, increased thirst  enlarged or tender breasts  headache  irregular menstrual periods  sexual difficulty, unable to have an erection  stomach upset This list may not describe all possible side effects. Call your doctor for medical advice about side effects. You may report side effects to FDA at 1-800-FDA-1088. Where should I keep my medicine? Keep out  of the reach of children and pets. Store at room temperature between 20 and 25 degrees C (68 and 77 degrees F). Throw away any unused drug after the expiration date. NOTE: This sheet is a summary. It may not cover all possible information. If you have questions about this medicine, talk to your doctor, pharmacist, or health care provider.  2020 Elsevier/Gold Standard (2019-07-13 12:38:34) Radial Site Care  This sheet gives you information about how to care for yourself after your procedure. Your health care provider may also give you more specific instructions. If you have problems or questions, contact your health care provider. What can I expect after the procedure? After the procedure, it is common to have:  Bruising and tenderness at the catheter insertion area. Follow these instructions at home: Medicines  Take over-the-counter and prescription medicines only as told by your health care provider. Insertion site care  Follow instructions from your health care provider about how to take care of your insertion site. Make sure you: ? Wash your hands with soap and water before you change your bandage (dressing). If soap and water are not available, use hand sanitizer. ? Change your dressing as told by your health care provider. ? Leave stitches (sutures), skin glue, or adhesive strips in place. These skin closures may need to stay in place for 2  weeks or longer. If adhesive strip edges start to loosen and curl up, you may trim the loose edges. Do not remove adhesive strips completely unless your health care provider tells you to do that.  Check your insertion site every day for signs of infection. Check for: ? Redness, swelling, or pain. ? Fluid or blood. ? Pus or a bad smell. ? Warmth.  Do not take baths, swim, or use a hot tub until your health care provider approves.  You may shower 24-48 hours after the procedure, or as directed by your health care provider. ? Remove the dressing and gently wash the site with plain soap and water. ? Pat the area dry with a clean towel. ? Do not rub the site. That could cause bleeding.  Do not apply powder or lotion to the site. Activity   For 24 hours after the procedure, or as directed by your health care provider: ? Do not flex or bend the affected arm. ? Do not push or pull heavy objects with the affected arm. ? Do not drive yourself home from the hospital or clinic. You may drive 24 hours after the procedure unless your health care provider tells you not to. ? Do not operate machinery or power tools.  Do not lift anything that is heavier than 10 lb (4.5 kg), or the limit that you are told, until your health care provider says that it is safe.  Ask your health care provider when it is okay to: ? Return to work or school. ? Resume usual physical activities or sports. ? Resume sexual activity. General instructions  If the catheter site starts to bleed, raise your arm and put firm pressure on the site. If the bleeding does not stop, get help right away. This is a medical emergency.  If you went home on the same day as your procedure, a responsible adult should be with you for the first 24 hours after you arrive home.  Keep all follow-up visits as told by your health care provider. This is important. Contact a health care provider if:  You have a fever.  You have redness,  swelling,  or yellow drainage around your insertion site. Get help right away if:  You have unusual pain at the radial site.  The catheter insertion area swells very fast.  The insertion area is bleeding, and the bleeding does not stop when you hold steady pressure on the area.  Your arm or hand becomes pale, cool, tingly, or numb. These symptoms may represent a serious problem that is an emergency. Do not wait to see if the symptoms will go away. Get medical help right away. Call your local emergency services (911 in the [place] Do not drive yourself to the hospital. Summary  After the procedure, it is common to have bruising and tenderness at the site.  Follow instructions from your health care provider about how to take care of your radial site wound. Check the wound every day for signs of infection.  Do not lift anything that is heavier than 10 lb (4.5 kg), or the limit that you are told, until your health care provider says that it is safe. This information is not intended to replace advice given to you by your health care provider. Make sure you discuss any questions you have with your health care provider. Document Revised: 12/24/2017 Document Reviewed: 12/24/2017 Elsevier Patient Education  2020 [place], Consistent Carbohydrate Nutrition Therapy   A heart-healthy and consistent carbohydrate diet is recommended to manage heart disease and diabetes. To follow a heart-healthy and consistent carbohydrate diet, . Eat a balanced diet with whole grains, fruits and vegetables, and lean protein sources.  . Choose heart-healthy unsaturated fats. Limit saturated fats, trans fats, and cholesterol intake. Eat more plant-based or vegetarian meals using beans and soy foods for protein.  . Eat whole, unprocessed foods to limit the amount of sodium (salt) you eat.  . Choose a consistent amount of carbohydrate at each meal and snack. Limit refined carbohydrates especially sugar,  sweets and sugar-sweetened beverages.  . If you drink alcohol, do so in moderation: one serving per day (women) and two servings per day (men). o One serving is equivalent to 12 ounces beer, 5 ounces wine, or 1.5 ounces distilled spirits  Tips Tips for Choosing Heart-Healthy Fats Choose lean protein and low-fat dairy foods to reduce saturated fat intake. . Saturated fat is usually found in animal-based protein and is associated with certain health risks. Saturated fat is the biggest contributor to raise low-density lipoprotein (LDL) cholesterol levels. Research shows that limiting saturated fat lowers unhealthy cholesterol levels. Eat no more than 7% of your total calories each day from saturated fat. Ask your RDN to help you determine how much saturated fat is right for you. . There are many foods that do not contain large amounts of saturated fats. Swapping these foods to replace foods high in saturated fats will help you limit the saturated fat you eat and improve your cholesterol levels. You can also try eating more plant-based or vegetarian meals. Instead of. Try:  Whole milk, cheese, yogurt, and ice cream 1% or skim milk, low-fat cheese, non-fat yogurt, and low-fat ice cream  Fatty, marbled beef and pork Lean beef, pork, or venison  Poultry with skin Poultry without skin  Butter, stick margarine Reduced-fat, whipped, or liquid spreads  Coconut oil, palm oil Liquid vegetable oils: corn, canola, olive, soybean and safflower oils   Avoid foods that contain trans fats. . Trans fats increase levels of LDL-cholesterol. Hydrogenated fat in processed foods is the main source of trans fats in foods.  . Trans fats can  be found in stick margarine, shortening, processed sweets, baked goods, some fried foods, and packaged foods made with hydrogenated oils. Avoid foods with "partially hydrogenated oil" on the ingredient list such as: cookies, pastries, baked goods, biscuits, crackers, microwave popcorn,  and frozen dinners. Choose foods with heart healthy fats. . Polyunsaturated and monounsaturated fat are unsaturated fats that may help lower your blood cholesterol level when used in place of saturated fat in your diet. . Ask your RDN about taking a dietary supplement with plant sterols and stanols to help lower your cholesterol level. [name] Research shows that substituting saturated fats with unsaturated fats is beneficial to cholesterol levels. Try these easy swaps: Instead of. Try:  Butter, stick margarine, or solid shortening Reduced-fat, whipped, or liquid spreads  Beef, pork, or poultry with skin Fish and seafood  Chips, crackers, snack foods Raw or unsalted nuts and seeds or nut butters Hummus with vegetables Avocado on toast  Coconut oil, palm oil Liquid vegetable oils: corn, canola, olive, soybean and safflower oils  Limit the amount of cholesterol you eat to less than 200 milligrams per day. . Cholesterol is a substance carried through the bloodstream via lipoproteins, which are known as "transporters" of fat. Some body functions need cholesterol to work properly, but too much cholesterol in the bloodstream can damage arteries and build up blood vessel linings (which can lead to heart attack and stroke). You should eat less than 200 milligrams cholesterol per day. [name] People respond differently to eating cholesterol. There is no test available right now that can figure out which people will respond more to dietary cholesterol and which will respond less. For individuals with high intake of dietary cholesterol, different types of increase (none, small, moderate, large) in LDL-cholesterol levels are all possible.  . Food sources of cholesterol include egg yolks and organ meats such as liver, gizzards. Limit egg yolks to two to four per week and avoid organ meats like liver and gizzards to control cholesterol intake. Tips for Choosing Heart-Healthy Carbohydrates Consume a consistent amount of  carbohydrate . It is important to eat foods with carbohydrates in moderation because they impact your blood glucose level. Carbohydrates can be found in many foods such as: . Grains (breads, crackers, rice, pasta, and cereals)  . Starchy Vegetables (potatoes, corn, and peas)  . Beans and legumes  . Milk, soy milk, and yogurt  . Fruit and fruit juice  . Sweets (cakes, cookies, ice cream, jam and jelly) . Your RDN will help you set a goal for how many carbohydrate servings to eat at your meals and snacks. For many adults, eating 3 to 5 servings of carbohydrate foods at each meal and 1 or 2 carbohydrate servings for each snack works well.  . Check your blood glucose level regularly. It can tell you if you need to adjust when you eat carbohydrates. . Choose foods rich in viscous (soluble) fiber . Viscous, or soluble, is found in the walls of plant cells. Viscous fiber is found only in plant-based foods. Eating foods with fiber helps to lower your unhealthy cholesterol and keep your blood glucose in range  . Rich sources of viscous fiber include vegetables (asparagus, Brussels sprouts, sweet potatoes, turnips) fruit (apricots, mangoes, oranges), legumes, and whole grains (barley, oats, and oat bran).  . As you increase your fiber intake gradually, also increase the amount of water you drink. This will help prevent constipation.  . If you have difficulty achieving this goal, ask your RDN about fiber laxatives.  Choose fiber supplements made with viscous fibers such as psyllium seed husks or methylcellulose to help lower unhealthy cholesterol.  . Limit refined carbohydrates  . There are three types of carbohydrates: starches, sugar, and fiber. Some carbohydrates occur naturally in food, like the starches in rice or corn or the sugars in fruits and milk. Refined carbohydrates--foods with high amounts of simple sugars--can raise triglyceride levels. High triglyceride levels are associated with coronary heart  disease. . Some examples of refined carbohydrate foods are table sugar, sweets, and beverages sweetened with added sugar. Tips for Reducing Sodium (Salt) Although sodium is important for your body to function, too much sodium can be harmful for people with high blood pressure. As sodium and fluid buildup in your tissues and bloodstream, your blood pressure increases. High blood pressure may cause damage to other organs and increase your risk for a stroke. Even if you take a pill for blood pressure or a water pill (diuretic) to remove fluid, it is still important to have less salt in your diet. Ask your doctor and RDN what amount of sodium is right for you. [name] Avoid processed foods. Eat more fresh foods.  . Fresh fruits and vegetables are naturally low in sodium, as well as frozen vegetables and fruits that have no added juices or sauces.  . Fresh meats are lower in sodium than processed meats, such as bacon, sausage, and hotdogs. Read the nutrition label or ask your butcher to help you find a fresh meat that is low in sodium. . Eat less salt--at the table and when cooking.  . A single teaspoon of table salt has 2,300 mg of sodium.  . Leave the salt out of recipes for pasta, casseroles, and soups.  . Ask your RDN how to cook your favorite recipes without sodium . Be a Paramedic.  . Look for food packages that say "salt-free" or "sodium-free." These items contain less than 5 milligrams of sodium per serving.  [name] "Very low-sodium" products contain less than 35 milligrams of sodium per serving.  [name] "Low-sodium" products contain less than 140 milligrams of sodium per serving.  . Beware for "Unsalted" or "No Added Salt" products. These items may still be high in sodium. Check the nutrition label. . Add flavors to your food without adding sodium.  . Try lemon juice, lime juice, fruit juice or vinegar.  . Dry or fresh herbs add flavor. Try basil, bay leaf, dill, rosemary, parsley, sage, dry mustard,  nutmeg, thyme, and paprika.  . Pepper, red pepper flakes, and cayenne pepper can add spice t your meals without adding sodium. Hot sauce contains sodium, but if you use just a drop or two, it will not add up to much.  Sharyn Lull a sodium-free seasoning blend or make your own at home. Additional Lifestyle Tips Achieve and maintain a healthy weight. . Talk with your RDN or your doctor about what is a healthy weight for you. . Set goals to reach and maintain that weight.  . To lose weight, reduce your calorie intake along with increasing your physical activity. A weight loss of 10 to 15 pounds could reduce LDL-cholesterol by 5 milligrams per deciliter. Participate in physical activity. . Talk with your health care team to find out what types of physical activity are best for you. Set a plan to get about 30 minutes of exercise on most days.  Foods Recommended Food Group Foods Recommended  Grains Whole grain breads and cereals, including whole wheat, barley, rye, buckwheat,  corn, teff, quinoa, millet, amaranth, brown or wild rice, sorghum, and oats Pasta, especially whole wheat or other whole grain types  [employer], quinoa or wild rice Whole grain crackers, bread, rolls, pitas Home-made bread with reduced-sodium baking soda  Protein Foods Lean cuts of beef and pork (loin, leg, round, extra lean hamburger)  Skinless Cytogeneticist and other wild game Dried beans and peas Nuts and nut butters Meat alternatives made with soy or textured vegetable protein  Egg whites or egg substitute Cold cuts made with lean meat or soy protein  Dairy Nonfat (skim), low-fat, or 1%-fat milk  Nonfat or low-fat yogurt or cottage cheese Fat-free and low-fat cheese  Vegetables Fresh, frozen, or canned vegetables without added fat or salt   Fruits Fresh, frozen, canned, or dried fruit   Oils Unsaturated oils (corn, olive, peanut, soy, sunflower, canola)  Soft or liquid margarines and vegetable oil spreads    Salad dressings Seeds and nuts  Avocado   Foods Not Recommended Food Group Foods Not Recommended  Grains Breads or crackers topped with salt Cereals (hot or cold) with more than 300 mg sodium per serving Biscuits, cornbread, and other "quick" breads prepared with baking soda Bread crumbs or stuffing mix from a store High-fat bakery products, such as doughnuts, biscuits, croissants, danish pastries, pies, cookies Instant cooking foods to which you add hot water and stir--potatoes, noodles, rice, etc. Packaged starchy foods--seasoned noodle or rice dishes, stuffing mix, macaroni and cheese dinner Snacks made with partially hydrogenated oils, including chips, cheese puffs, snack mixes, regular crackers, butter-flavored popcorn  Protein Foods Higher-fat cuts of meats (ribs, t-bone steak, regular hamburger) Bacon, sausage, or hot dogs Cold cuts, such as salami or bologna, deli meats, cured meats, corned beef Organ meats (liver, brains, gizzards, sweetbreads) Poultry with skin Fried or smoked meat, poultry, and fish Whole eggs and egg yolks (more than 2-4 per week) Salted legumes, nuts, seeds, or nut/seed butters Meat alternatives with high levels of sodium (>300 mg per serving) or saturated fat (>5 g per serving)  Dairy Whole milk,?2% fat milk, buttermilk Whole milk yogurt or ice cream Cream Half-&-half Cream cheese Sour cream Cheese  Vegetables Canned or frozen vegetables with salt, fresh vegetables prepared with salt, butter, cheese, or cream sauce Fried vegetables Pickled vegetables such as olives, pickles, or sauerkraut  Fruits Fried fruits Fruits served with butter or cream  Oils Butter, stick margarine, shortening Partially hydrogenated oils or trans fats Tropical oils (coconut, palm, palm kernel oils)  Other Candy, sugar sweetened soft drinks and desserts Salt, sea salt, garlic salt, and seasoning mixes containing salt Bouillon cubes Ketchup, barbecue sauce,  Worcestershire sauce, soy sauce, teriyaki sauce Miso Salsa Pickles, olives, relish   Heart Healthy Consistent Carbohydrate Vegetarian (Lacto-Ovo) Sample 1-Day Menu  Breakfast 1 cup oatmeal, cooked (2 carbohydrate servings)   cup blueberries (1 carbohydrate serving)  11 almonds, without salt  1 cup 1% milk (1 carbohydrate serving)  1 cup coffee  Morning Snack 1 cup fat-free plain yogurt (1 carbohydrate serving)  Lunch 1 whole wheat bun (1 carbohydrate servings)  1 black bean burger (1 carbohydrate servings)  1 slice cheddar cheese, low sodium  2 slices tomatoes  2 leaves lettuce  1 teaspoon mustard  1 small pear (1 carbohydrate servings)  1 cup green tea, unsweetened  Afternoon Snack 1/3 cup trail mix with nuts, seeds, and raisins, without salt (1 carbohydrate servinga)  Evening Meal  cup meatless chicken  2/3 cup brown rice, cooked (2 carbohydrate  servings)  1 cup broccoli, cooked (2/3 carbohydrate serving)   cup carrots, cooked (1/3 carbohydrate serving)  2 teaspoons olive oil  1 teaspoon balsamic vinegar  1 whole wheat dinner roll (1 carbohydrate serving)  1 teaspoon margarine, soft, tub  1 cup 1% milk (1 carbohydrate serving)  Evening Snack 1 extra small banana (1 carbohydrate serving)  1 tablespoon peanut butter   Heart Healthy Consistent Carbohydrate Vegan Sample 1-Day Menu  Breakfast 1 cup oatmeal, cooked (2 carbohydrate servings)   cup blueberries (1 carbohydrate serving)  11 almonds, without salt  1 cup soymilk fortified with calcium, vitamin B12, and vitamin D  1 cup coffee  Morning Snack 6 ounces soy yogurt (1 carbohydrate servings)  Lunch 1 whole wheat bun(1 carbohydrate servings)  1 black bean burger (1 carbohydrate serving)  2 slices tomatoes  2 leaves lettuce  1 teaspoon mustard  1 small pear (1 carbohydrate servings)  1 cup green tea, unsweetened  Afternoon Snack 1/3 cup trail mix with nuts, seeds, and raisins, without salt (1 carbohydrate  servings)  Evening Meal  cup meatless chicken  2/3 cup brown rice, cooked (2 carbohydrate servings)  1 cup broccoli, cooked (2/3 carbohydrate serving)   cup carrots, cooked (1/3 carbohydrate serving)  2 teaspoons olive oil  1 teaspoon balsamic vinegar  1 whole wheat dinner roll (1 carbohydrate serving)  1 teaspoon margarine, soft, tub  1 cup soymilk fortified with calcium, vitamin B12, and vitamin D  Evening Snack 1 extra small banana (1 carbohydrate serving)  1 tablespoon peanut butter    Heart Healthy Consistent Carbohydrate Sample 1-Day Menu  Breakfast 1 cup cooked oatmeal (2 carbohydrate servings)  3/4 cup blueberries (1 carbohydrate serving)  1 ounce almonds  1 cup skim milk (1 carbohydrate serving)  1 cup coffee  Morning Snack 1 cup sugar-free nonfat yogurt (1 carbohydrate serving)  Lunch 2 slices whole-wheat bread (2 carbohydrate servings)  2 ounces lean [place] breast  1 ounce low-fat Swiss cheese  1 teaspoon mustard  1 slice tomato  1 lettuce leaf  1 small pear (1 carbohydrate serving)  1 cup skim milk (1 carbohydrate serving)  Afternoon Snack 1 ounce trail mix with unsalted nuts, seeds, and raisins (1 carbohydrate serving)  Evening Meal 3 ounces salmon  2/3 cup cooked brown rice (2 carbohydrate servings)  1 teaspoon soft margarine  1 cup cooked broccoli with 1/2 cup cooked carrots (1 carbohydrate serving  Carrots, cooked, boiled, drained, without salt  1 cup lettuce  1 teaspoon olive oil with vinegar for dressing  1 small whole grain roll (1 carbohydrate serving)  1 teaspoon soft margarine  1 cup unsweetened tea  Evening Snack 1 extra-small banana (1 carbohydrate serving)  Copyright 2020  [employer]. All rights reserved.

## 2020-09-27 ENCOUNTER — Ambulatory Visit (INDEPENDENT_AMBULATORY_CARE_PROVIDER_SITE_OTHER): Payer: Medicaid Other | Admitting: "Endocrinology

## 2020-09-27 ENCOUNTER — Encounter: Payer: Self-pay | Admitting: "Endocrinology

## 2020-09-27 ENCOUNTER — Other Ambulatory Visit: Payer: Self-pay

## 2020-09-27 VITALS — BP 110/78 | HR 100 | Ht 67.0 in | Wt 225.8 lb

## 2020-09-27 DIAGNOSIS — I10 Essential (primary) hypertension: Secondary | ICD-10-CM

## 2020-09-27 DIAGNOSIS — E1159 Type 2 diabetes mellitus with other circulatory complications: Secondary | ICD-10-CM

## 2020-09-27 DIAGNOSIS — E782 Mixed hyperlipidemia: Secondary | ICD-10-CM

## 2020-09-27 MED ORDER — METFORMIN HCL 1000 MG PO TABS: 1000.0000 mg | ORAL_TABLET | Freq: Two times a day (BID) | ORAL | 3 refills | Status: AC

## 2020-09-27 NOTE — Patient Instructions (Signed)
                                     Advice for Weight Management  -For most of us the best way to lose weight is by diet management. Generally speaking, diet management means consuming less calories intentionally which over time brings about progressive weight loss.  This can be achieved more effectively by restricting carbohydrate consumption to the minimum possible.  So, it is critically important to know your numbers: how much calorie you are consuming and how much calorie you need. More importantly, our carbohydrates sources should be unprocessed or minimally processed complex starch food items.   Sometimes, it is important to balance nutrition by increasing protein intake (animal or plant source), fruits, and vegetables.  -Sticking to a routine mealtime to eat 3 meals a day and avoiding unnecessary snacks is shown to have a big role in weight control. Under normal circumstances, the only time we lose real weight is when we are hungry, so allow hunger to take place- hunger means no food between meal times, only water.  It is not advisable to starve.   -It is better to avoid simple carbohydrates including: Cakes, Sweet Desserts, Ice Cream, Soda (diet and regular), Sweet Tea, Candies, Chips, Cookies, Store Bought Juices, Alcohol in Excess of  1-2 drinks a day, Artificial Sweeteners, Doughnuts, Coffee Creamers, "Sugar-free" Products, etc, etc.  This is not a complete list.....    -Consulting with certified diabetes educators is proven to provide you with the most accurate and current information on diet.  Also, you may be  interested in discussing diet options/exchanges , we can schedule a visit with Kaisha Crumpton, RDN, CDE for individualized nutrition education.  -Exercise: If you are able: 30 -60 minutes a day ,4 days a week, or 150 minutes a week.  The longer the better.  Combine stretch, strength, and aerobic activities.  If you were told in the past that you  have high risk for cardiovascular diseases, you may seek evaluation by your heart doctor prior to initiating moderate to intense exercise programs.                                  Additional Care Considerations for Diabetes   -Diabetes  is a chronic disease.  The most important care consideration is regular follow-up with your diabetes care provider with the goal being avoiding or delaying its complications and to take advantage of advances in medications and technology.    -Type 2 diabetes is known to coexist with other important comorbidities such as high blood pressure and high cholesterol.  It is critical to control not only the diabetes but also the high blood pressure and high cholesterol to minimize and delay the risk of complications including coronary artery disease, stroke, amputations, blindness, etc.    - Studies showed that people with diabetes will benefit from a class of medications known as ACE inhibitors and statins.  Unless there are specific reasons not to be on these medications, the standard of care is to consider getting one from these groups of medications at an optimal doses.  These medications are generally considered safe and proven to help protect the heart and the kidneys.    - People with diabetes are encouraged to initiate and maintain regular follow-up with eye doctors, foot doctors, dentists ,   and if necessary heart and kidney doctors.     - It is highly recommended that people with diabetes quit smoking or stay away from smoking, and get yearly  flu vaccine and pneumonia vaccine at least every 5 years.  One other important lifestyle recommendation is to ensure adequate sleep - at least 6-7 hours of uninterrupted sleep at night.  -Exercise: If you are able: 30 -60 minutes a day, 4 days a week, or 150 minutes a week.  The longer the better.  Combine stretch, strength, and aerobic activities.  If you were told in the past that you have high risk for cardiovascular  diseases, you may seek evaluation by your heart doctor prior to initiating moderate to intense exercise programs.          
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
## 2020-09-28 ENCOUNTER — Encounter: Payer: Self-pay | Admitting: Nutrition

## 2020-09-28 ENCOUNTER — Encounter: Payer: Medicaid Other | Attending: "Endocrinology | Admitting: Nutrition

## 2020-09-28 ENCOUNTER — Other Ambulatory Visit: Payer: Self-pay | Admitting: "Endocrinology

## 2020-09-28 VITALS — Ht 67.0 in | Wt 224.0 lb

## 2020-09-28 DIAGNOSIS — E782 Mixed hyperlipidemia: Secondary | ICD-10-CM | POA: Insufficient documentation

## 2020-09-28 DIAGNOSIS — I10 Essential (primary) hypertension: Secondary | ICD-10-CM | POA: Diagnosis present

## 2020-09-28 DIAGNOSIS — E1159 Type 2 diabetes mellitus with other circulatory complications: Secondary | ICD-10-CM | POA: Diagnosis not present

## 2020-09-28 DIAGNOSIS — G459 Transient cerebral ischemic attack, unspecified: Secondary | ICD-10-CM | POA: Insufficient documentation

## 2020-09-28 NOTE — Patient Instructions (Addendum)
Goal  Cut out coke zero's Eat 3 meals at times discussed Don't skip meals. Cut out processed meals of bacon and sausages. Test blood sugars before meals Goals; AM blood sugar before meals an bedtime less than 150 .

## 2020-09-28 NOTE — Progress Notes (Signed)
Medical Nutrition Therapy:  Appt start time: 0830 end time:  0930.  Assessment:  Primary concerns today: Diabetes Type 2, Obesity, heart attack 2 weeks ago, Hyperlipidemia and HTN.  Sees Dr.[name], Endocrinology. She is seperated but husband is helping her since her stroke/heart attack.. Her son lives with her. Heart  25% function according to her.. Has a surgical shoe boot  on her right food; has an infected index toe on right foot. Seeing Dr. Donovan Kail, foot dr in [facility]. Tresiba 70 units a day, Humalog 10 units with sliding scale with meals. Ozempic .25 mg, Metformin 1000 mg BID.  She is following a low salt diet and wants to work on eating better and improve her DM.  She has cut out diet sodas for the most part. Drinking water. Working on eating meals on time, especially breakfast. Just started walking a little bit yesterday.  Still weak from heart attack and stoke. No problems chewing or swallowing.  Motivated to improve her DM. Insterested in the [employer] CGM to help improve her DM.Will be testing 4 times per day and record on log sheets.  Lab Results  Component Value Date   HGBA1C 11.1 (H) 09/16/2020   CMP Latest Ref Rng & Units 09/22/2020 09/21/2020 09/20/2020  Glucose 70 - 99 mg/dL 127(H) 99 -  BUN 6 - 20 mg/dL 14 10 -  Creatinine 0.44 - 1.00 mg/dL 0.77 0.75 0.66  Sodium 135 - 145 mmol/L 138 140 -  Potassium 3.5 - 5.1 mmol/L 3.5 3.8 -  Chloride 98 - 111 mmol/L 104 106 -  CO2 22 - 32 mmol/L 23 23 -  Calcium 8.9 - 10.3 mg/dL 9.0 9.2 -  Total Protein 6.5 - 8.1 g/dL 6.2(L) 6.4(L) -  Total Bilirubin 0.3 - 1.2 mg/dL 0.7 0.9 -  Alkaline Phos 38 - 126 U/L 145(H) 171(H) -  AST 15 - 41 U/L 24 36 -  ALT 0 - 44 U/L 105(H) 160(H) -   Lipid Panel     Component Value Date/Time   CHOL 104 09/21/2020 0134   TRIG 170 (H) 09/21/2020 0134   HDL 18 (L) 09/21/2020 0134   CHOLHDL 5.8 09/21/2020 0134   VLDL 34 09/21/2020 0134   LDLCALC 52 09/21/2020 0134    Preferred Learning Style:   No  preference indicated   Learning Readiness:   Ready  Change in progress   MEDICATIONS:    DIETARY INTAKE:  24-hr recall:  B ( AM):  2 eggs, bacon 3, tomatoes, coffee-cream and splenda L) Chicken salad with ranch dressing, water, 3 crackers D) chicken stirfy no season, Mrs Deliah Boston;  Soundra [place] with Spring mix and vinegar dressing, water,  Beverages: water  Usual physical activity:  Just  Walking 10 minutes a day around her parking lot.  Estimated energy needs: 1200  calories 135 g carbohydrates 90 g protein 33 g fat  Progress Towards Goal(s):  In progress.   Nutritional Diagnosis:  NB-1.1 Food and nutrition-related knowledge deficit As related to Diabetes Type 2, CVD.  As evidenced by A1C 11.1% and recent HA and CVA.[name]    Intervention:  Nutrition and Diabetes education provided on My Plate, CHO counting, meal planning, portion sizes, timing of meals, avoiding snacks between meals unless having a low blood sugar, target ranges for A1C and blood sugars, signs/symptoms and treatment of hyper/hypoglycemia, monitoring blood sugars, taking medications as prescribed, benefits of exercising 30 minutes per day and prevention of complications of DM.  Goal  Cut out coke zero's Eat 3 meals  at times discussed Don't skip meals Cut out processed meals of bacon and sausages. Test blood sugars before meals Goals; AM blood sugar before meals an bedtime less than 150 .  Teaching Method Utilized:  Visual Auditory Hands on  Handouts given during visit include:  The Plate Method  Meal Plan Card  Diabetes Instructions.     Barriers to learning/adherence to lifestyle change: None  Demonstrated degree of understanding via:  Teach Back   Monitoring/Evaluation:  Dietary intake, exercise,  and body weight in 1 weeks).

## 2020-10-04 ENCOUNTER — Other Ambulatory Visit: Payer: Self-pay

## 2020-10-04 ENCOUNTER — Encounter: Payer: Self-pay | Admitting: "Endocrinology

## 2020-10-04 ENCOUNTER — Encounter: Payer: Medicaid Other | Attending: "Endocrinology | Admitting: Nutrition

## 2020-10-04 ENCOUNTER — Ambulatory Visit (INDEPENDENT_AMBULATORY_CARE_PROVIDER_SITE_OTHER): Payer: Medicaid Other | Admitting: "Endocrinology

## 2020-10-04 VITALS — BP 120/78 | HR 96 | Ht 67.0 in | Wt 227.2 lb

## 2020-10-04 DIAGNOSIS — I252 Old myocardial infarction: Secondary | ICD-10-CM

## 2020-10-04 DIAGNOSIS — I10 Essential (primary) hypertension: Secondary | ICD-10-CM

## 2020-10-04 DIAGNOSIS — E1159 Type 2 diabetes mellitus with other circulatory complications: Secondary | ICD-10-CM | POA: Insufficient documentation

## 2020-10-04 DIAGNOSIS — G459 Transient cerebral ischemic attack, unspecified: Secondary | ICD-10-CM

## 2020-10-04 DIAGNOSIS — E782 Mixed hyperlipidemia: Secondary | ICD-10-CM | POA: Diagnosis present

## 2020-10-04 MED ORDER — DEXCOM G6 SENSOR MISC: 4.0000 | 2 refills | Status: DC

## 2020-10-04 MED ORDER — DEXCOM G6 RECEIVER DEVI: 1.0000 | 0 refills | Status: DC | PRN

## 2020-10-04 MED ORDER — DEXCOM G6 TRANSMITTER MISC: 1.0000 | 1 refills | Status: DC
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
## 2020-10-04 NOTE — Progress Notes (Signed)
Medical Nutrition Therapy:  Appt start time: 1100 end time:  1130  Assessment:  Primary concerns today: Diabetes Type 2, Obesity, heart attack 2 weeks ago, Hyperlipidemia and HTN.  Sees Dr.[name], Endocrinology.  Saw Dr. Dorris Fetch today. BS avg is now 160 mg/dl. Testing 4 times per day. Doing much better. BS have come down nicely. They have come from 300's down to 100's within the last week Avg for last week has been 160, down from 238 mg/dl avg. No hypoglycemia. Taking Metformin 1000 mg BID, Tresiba 70 units at night , Ozempic and has been taking 10 units of  Humalog with meals plus sliding scale. Changes made : Eating more lower carb vegetables. Cut out snacks. Eating meals on time. Drinking only water. Cooking meals at home and cut out processed and fast foods.Feels so much better. Has more energy and not as tired. Her daughter has been really helpful.  Compliant with her medications now also.    Couldn't get DEXCOM because she has an older phone and can't download apps. Will try to see if she can get the LIBRE CGM.   Since she is limited with her dexterity from her stoke, she would benefit from CGM to get better BS results for improved insulin dosing and more accurate BS Control. She walks with a cane.  Lab Results  Component Value Date   HGBA1C 11.1 (H) 09/16/2020   CMP Latest Ref Rng & Units 09/22/2020 09/21/2020 09/20/2020  Glucose 70 - 99 mg/dL 127(H) 99 -  BUN 6 - 20 mg/dL 14 10 -  Creatinine 0.44 - 1.00 mg/dL 0.77 0.75 0.66  Sodium 135 - 145 mmol/L 138 140 -  Potassium 3.5 - 5.1 mmol/L 3.5 3.8 -  Chloride 98 - 111 mmol/L 104 106 -  CO2 22 - 32 mmol/L 23 23 -  Calcium 8.9 - 10.3 mg/dL 9.0 9.2 -  Total Protein 6.5 - 8.1 g/dL 6.2(L) 6.4(L) -  Total Bilirubin 0.3 - 1.2 mg/dL 0.7 0.9 -  Alkaline Phos 38 - 126 U/L 145(H) 171(H) -  AST 15 - 41 U/L 24 36 -  ALT 0 - 44 U/L 105(H) 160(H) -     Lab Results  Component Value Date   HGBA1C 11.1 (H) 09/16/2020   CMP Latest Ref Rng &  Units 09/22/2020 09/21/2020 09/20/2020  Glucose 70 - 99 mg/dL 127(H) 99 -  BUN 6 - 20 mg/dL 14 10 -  Creatinine 0.44 - 1.00 mg/dL 0.77 0.75 0.66  Sodium 135 - 145 mmol/L 138 140 -  Potassium 3.5 - 5.1 mmol/L 3.5 3.8 -  Chloride 98 - 111 mmol/L 104 106 -  CO2 22 - 32 mmol/L 23 23 -  Calcium 8.9 - 10.3 mg/dL 9.0 9.2 -  Total Protein 6.5 - 8.1 g/dL 6.2(L) 6.4(L) -  Total Bilirubin 0.3 - 1.2 mg/dL 0.7 0.9 -  Alkaline Phos 38 - 126 U/L 145(H) 171(H) -  AST 15 - 41 U/L 24 36 -  ALT 0 - 44 U/L 105(H) 160(H) -   Lipid Panel     Component Value Date/Time   CHOL 104 09/21/2020 0134   TRIG 170 (H) 09/21/2020 0134   HDL 18 (L) 09/21/2020 0134   CHOLHDL 5.8 09/21/2020 0134   VLDL 34 09/21/2020 0134   LDLCALC 52 09/21/2020 0134    Preferred Learning Style:   No preference indicated   Learning Readiness:   Ready  Change in progress   MEDICATIONS:    DIETARY INTAKE:  24-hr recall:  B ( AM):Optiva bar ;or equate protein shakes  L)  Same as dinner , pintos 1/2 c and collards, water D) Meatloaf, fresh collards,  Water Beverages: water  Usual physical activity:  Just  Walking 10 minutes a day around her parking lot.  Estimated energy needs: 1200  calories 135 g carbohydrates 90 g protein 33 g fat  Progress Towards Goal(s):  In progress.   Nutritional Diagnosis:  NB-1.1 Food and nutrition-related knowledge deficit As related to Diabetes Type 2, CVD.  As evidenced by A1C 11.1% and recent HA and CVA.[name]    Intervention:  Nutrition and Diabetes education provided on My Plate, CHO counting, meal planning, portion sizes, timing of meals, avoiding snacks between meals unless having a low blood sugar, target ranges for A1C and blood sugars, signs/symptoms and treatment of hyper/hypoglycemia, monitoring blood sugars, taking medications as prescribed, benefits of exercising 30 minutes per day and prevention of complications of DM.  Goals  Keep eating meals on time. Continue to  increase fresh fruits and vegetables. Don't skip meals. Take insulin as prescribed. Prevent hypoglycemia. Get A1C down to 8% or below.  Teaching Method Utilized:  Visual Auditory Hands on  Handouts given during visit include:  The Plate Method  Meal Plan Card  Diabetes Instructions.     Barriers to learning/adherence to lifestyle change: None  Demonstrated degree of understanding via:  Teach Back   Monitoring/Evaluation:  Dietary intake, exercise,  and body weight in 1 month.. Recommend to try to order the Elenor Legato since she doesn't have phone compatible with Geraldine.

## 2020-10-04 NOTE — Patient Instructions (Signed)
                                     Advice for Weight Management  -For most of us the best way to lose weight is by diet management. Generally speaking, diet management means consuming less calories intentionally which over time brings about progressive weight loss.  This can be achieved more effectively by restricting carbohydrate consumption to the minimum possible.  So, it is critically important to know your numbers: how much calorie you are consuming and how much calorie you need. More importantly, our carbohydrates sources should be unprocessed or minimally processed complex starch food items.   Sometimes, it is important to balance nutrition by increasing protein intake (animal or plant source), fruits, and vegetables.  -Sticking to a routine mealtime to eat 3 meals a day and avoiding unnecessary snacks is shown to have a big role in weight control. Under normal circumstances, the only time we lose real weight is when we are hungry, so allow hunger to take place- hunger means no food between meal times, only water.  It is not advisable to starve.   -It is better to avoid simple carbohydrates including: Cakes, Sweet Desserts, Ice Cream, Soda (diet and regular), Sweet Tea, Candies, Chips, Cookies, Store Bought Juices, Alcohol in Excess of  1-2 drinks a day, Artificial Sweeteners, Doughnuts, Coffee Creamers, "Sugar-free" Products, etc, etc.  This is not a complete list.....    -Consulting with certified diabetes educators is proven to provide you with the most accurate and current information on diet.  Also, you may be  interested in discussing diet options/exchanges , we can schedule a visit with Denitra Crumpton, RDN, CDE for individualized nutrition education.  -Exercise: If you are able: 30 -60 minutes a day ,4 days a week, or 150 minutes a week.  The longer the better.  Combine stretch, strength, and aerobic activities.  If you were told in the past that you  have high risk for cardiovascular diseases, you may seek evaluation by your heart doctor prior to initiating moderate to intense exercise programs.                                  Additional Care Considerations for Diabetes   -Diabetes  is a chronic disease.  The most important care consideration is regular follow-up with your diabetes care provider with the goal being avoiding or delaying its complications and to take advantage of advances in medications and technology.    -Type 2 diabetes is known to coexist with other important comorbidities such as high blood pressure and high cholesterol.  It is critical to control not only the diabetes but also the high blood pressure and high cholesterol to minimize and delay the risk of complications including coronary artery disease, stroke, amputations, blindness, etc.    - Studies showed that people with diabetes will benefit from a class of medications known as ACE inhibitors and statins.  Unless there are specific reasons not to be on these medications, the standard of care is to consider getting one from these groups of medications at an optimal doses.  These medications are generally considered safe and proven to help protect the heart and the kidneys.    - People with diabetes are encouraged to initiate and maintain regular follow-up with eye doctors, foot doctors, dentists ,   and if necessary heart and kidney doctors.     - It is highly recommended that people with diabetes quit smoking or stay away from smoking, and get yearly  flu vaccine and pneumonia vaccine at least every 5 years.  One other important lifestyle recommendation is to ensure adequate sleep - at least 6-7 hours of uninterrupted sleep at night.  -Exercise: If you are able: 30 -60 minutes a day, 4 days a week, or 150 minutes a week.  The longer the better.  Combine stretch, strength, and aerobic activities.  If you were told in the past that you have high risk for cardiovascular  diseases, you may seek evaluation by your heart doctor prior to initiating moderate to intense exercise programs.          

## 2020-10-04 NOTE — Patient Instructions (Addendum)
Goals  Keep eating meals on time. Continue to increase fresh fruits and vegetables. Don't skip meals. Take insulin as prescribed. Prevent hypoglycemia. Get A1C down to 8% or below.

## 2020-10-09 ENCOUNTER — Other Ambulatory Visit (HOSPITAL_BASED_OUTPATIENT_CLINIC_OR_DEPARTMENT_OTHER): Payer: Self-pay

## 2020-10-09 DIAGNOSIS — G4733 Obstructive sleep apnea (adult) (pediatric): Secondary | ICD-10-CM

## 2020-10-10 ENCOUNTER — Encounter: Payer: Self-pay | Admitting: Nutrition

## 2020-10-10 ENCOUNTER — Other Ambulatory Visit: Payer: Self-pay

## 2020-10-10 ENCOUNTER — Encounter: Payer: Self-pay | Admitting: Cardiology

## 2020-10-10 ENCOUNTER — Ambulatory Visit (INDEPENDENT_AMBULATORY_CARE_PROVIDER_SITE_OTHER): Payer: Medicaid Other | Admitting: Cardiology

## 2020-10-10 VITALS — BP 124/84 | HR 84 | Ht 67.0 in | Wt 226.0 lb

## 2020-10-10 DIAGNOSIS — E1165 Type 2 diabetes mellitus with hyperglycemia: Secondary | ICD-10-CM

## 2020-10-10 DIAGNOSIS — I5181 Takotsubo syndrome: Secondary | ICD-10-CM | POA: Diagnosis not present

## 2020-10-10 DIAGNOSIS — I214 Non-ST elevation (NSTEMI) myocardial infarction: Secondary | ICD-10-CM | POA: Diagnosis not present

## 2020-10-10 DIAGNOSIS — E782 Mixed hyperlipidemia: Secondary | ICD-10-CM

## 2020-10-10 DIAGNOSIS — G894 Chronic pain syndrome: Secondary | ICD-10-CM

## 2020-10-10 DIAGNOSIS — I10 Essential (primary) hypertension: Secondary | ICD-10-CM

## 2020-10-10 DIAGNOSIS — Z79899 Other long term (current) drug therapy: Secondary | ICD-10-CM | POA: Diagnosis not present

## 2020-10-10 MED ORDER — ENTRESTO 24-26 MG PO TABS: 1.0000 | ORAL_TABLET | Freq: Two times a day (BID) | ORAL | 6 refills | Status: DC

## 2020-10-10 NOTE — Assessment & Plan Note (Signed)
Troponin elevation in the setting of sepsis-c/w demand ischemia

## 2020-10-10 NOTE — Patient Instructions (Signed)
Medication Instructions:  Your physician has recommended you make the following change in your medication:  Stop Taking Losartan  Start Taking Entresto 24-26 mg Two Times Daily   *If you need a refill on your cardiac medications before your next appointment, please call your pharmacy*   Lab Work: Your physician recommends that you return for lab work in: 2 Weeks (10/25/20)  If you have labs (blood work) drawn today and your tests are completely normal, you will receive your results only by: [name] MyChart Message (if you have MyChart) OR . A paper copy in the mail If you have any lab test that is abnormal or we need to change your treatment, we will call you to review the results.   Testing/Procedures: NONE    Follow-Up: At [facility], you and your health needs are our priority.  As part of our continuing mission to provide you with exceptional heart care, we have created designated Provider Care Teams.  These Care Teams include your primary Cardiologist (physician) and Advanced Practice Providers (APPs -  Physician Assistants and Nurse Practitioners) who all work together to provide you with the care you need, when you need it.  We recommend signing up for the patient portal called "MyChart".  Sign up information is provided on this After Visit Summary.  MyChart is used to connect with patients for Virtual Visits (Telemedicine).  Patients are able to view lab/test results, encounter notes, upcoming appointments, etc.  Non-urgent messages can be sent to your provider as well.   To learn more about what you can do with MyChart, go to [email or web address].    Your next appointment:   2-3 week(s)  The format for your next appointment:   In Person  Provider:   You will see one of the following Advanced Practice Providers on your designated Care Team:    Bernerd Pho, PA-C   Ermalinda Barrios, PA-C     Other Instructions Thank you for choosing [place]!

## 2020-10-10 NOTE — Assessment & Plan Note (Signed)
Now followed by an endocrinologist

## 2020-10-10 NOTE — Assessment & Plan Note (Signed)
LDL 52 on lipitor 40 mg
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
## 2020-10-10 NOTE — Assessment & Plan Note (Signed)
EF 20-25% by echo in the setting of sepsis. Transition to [facility] and f/u in 2-3 weeks (after BMP in 2 weeks).  She'll need a f/u echo in 3 months.

## 2020-10-11 ENCOUNTER — Encounter: Payer: Self-pay | Admitting: Advanced Practice Midwife

## 2020-10-11 ENCOUNTER — Other Ambulatory Visit (HOSPITAL_COMMUNITY)
Admission: RE | Admit: 2020-10-11 | Discharge: 2020-10-11 | Disposition: A | Discharge status: Home / Self Care | Payer: Medicaid Other | Source: Ambulatory Visit | Attending: Advanced Practice Midwife | Admitting: Advanced Practice Midwife

## 2020-10-11 ENCOUNTER — Ambulatory Visit: Payer: Medicaid Other | Admitting: Cardiology

## 2020-10-11 ENCOUNTER — Ambulatory Visit (INDEPENDENT_AMBULATORY_CARE_PROVIDER_SITE_OTHER): Payer: Medicaid Other | Admitting: Advanced Practice Midwife

## 2020-10-11 VITALS — BP 117/82 | HR 95 | Wt 227.0 lb

## 2020-10-11 DIAGNOSIS — N898 Other specified noninflammatory disorders of vagina: Secondary | ICD-10-CM | POA: Insufficient documentation

## 2020-10-11 DIAGNOSIS — R399 Unspecified symptoms and signs involving the genitourinary system: Secondary | ICD-10-CM | POA: Diagnosis not present

## 2020-10-11 DIAGNOSIS — N760 Acute vaginitis: Secondary | ICD-10-CM | POA: Diagnosis not present

## 2020-10-11 DIAGNOSIS — B9689 Other specified bacterial agents as the cause of diseases classified elsewhere: Secondary | ICD-10-CM | POA: Diagnosis not present

## 2020-10-11 LAB — POCT URINALYSIS DIPSTICK OB
Blood, UA: NEGATIVE
Ketones, UA: NEGATIVE
Nitrite, UA: NEGATIVE
POC,PROTEIN,UA: NEGATIVE

## 2020-10-11 LAB — POCT WET PREP (WET MOUNT): Trichomonas Wet Prep HPF POC: ABSENT

## 2020-10-11 MED ORDER — FLUCONAZOLE 150 MG PO TABS: 150.0000 mg | ORAL_TABLET | Freq: Once | ORAL | 0 refills | Status: AC

## 2020-10-11 MED ORDER — METRONIDAZOLE 500 MG PO TABS: 500.0000 mg | ORAL_TABLET | Freq: Two times a day (BID) | ORAL | 0 refills | Status: DC

## 2020-10-11 NOTE — Patient Instructions (Signed)
Bacterial Vaginosis  Bacterial vaginosis is an infection of the vagina. It happens when too many normal germs (healthy bacteria) grow in the vagina. This infection puts you at risk for infections from sex (STIs). Treating this infection can lower your risk for some STIs. You should also treat this if you are pregnant. It can cause your baby to be born early. Follow these instructions at home: Medicines  Take over-the-counter and prescription medicines only as told by your doctor.  Take or use your antibiotic medicine as told by your doctor. Do not stop taking or using it even if you start to feel better. General instructions  If you your sexual partner is a woman, tell her that you have this infection. She needs to get treatment if she has symptoms. If you have a female partner, he does not need to be treated.  During treatment: ? Avoid sex. ? Do not douche. ? Avoid alcohol as told. ? Avoid breastfeeding as told.  Drink enough fluid to keep your pee (urine) clear or pale yellow.  Keep your vagina and butt (rectum) clean. ? Wash the area with warm water every day. ? Wipe from front to back after you use the toilet.  Keep all follow-up visits as told by your doctor. This is important. Preventing this condition  Do not douche.  Use only warm water to wash around your vagina.  Use protection when you have sex. This includes: ? Latex condoms. ? Dental dams.  Limit how many people you have sex with. It is best to only have sex with the same person (be monogamous).  Get tested for STIs. Have your partner get tested.  Wear underwear that is cotton or lined with cotton.  Avoid tight pants and pantyhose. This is most important in summer.  Do not use any products that have nicotine or tobacco in them. These include cigarettes and e-cigarettes. If you need help quitting, ask your doctor.  Do not use illegal drugs.  Limit how much alcohol you drink. Contact a doctor if:  Your  symptoms do not get better, even after you are treated.  You have more discharge or pain when you pee (urinate).  You have a fever.  You have pain in your belly (abdomen).  You have pain with sex.  Your bleed from your vagina between periods. Summary  This infection happens when too many germs (bacteria) grow in the vagina.  Treating this condition can lower your risk for some infections from sex (STIs).  You should also treat this if you are pregnant. It can cause early (premature) birth.  Do not stop taking or using your antibiotic medicine even if you start to feel better. This information is not intended to replace advice given to you by your health care provider. Make sure you discuss any questions you have with your health care provider. Document Revised: 10/31/2017 Document Reviewed: 08/03/2016 Elsevier Patient Education  2020 [employer].

## 2020-10-11 NOTE — Progress Notes (Addendum)
GYN VISIT Patient name: Yolanda [name] MRN [id number]  Date of birth: May 11, 1971 Chief Complaint:   Gynecologic Exam (Thick, yellow, odorous discharge)  History of Present Illness:   Yolanda [name] is a 49 y.o. [phone] Caucasian female being seen today for thick, yellow vag d/c she has noticed for the past few days, +odor, no itching.     Depression screen [facility] 2/9 09/28/2020 02/15/2020 06/11/2016 04/16/2016 03/06/2016  Decreased Interest 0 0 0 0 0  Down, Depressed, Hopeless 0 0 0 0 0  PHQ - 2 Score 0 0 0 0 0    Patient's last menstrual period was 09/10/2020 (approximate). Last pap March 2021. Results were:  normal, but +HRHPV Review of Systems:   Pertinent items are noted in HPI Denies fever/chills, dizziness, headaches, visual disturbances, fatigue, shortness of breath, chest pain, abdominal pain, vomiting, abnormal vaginal discharge/itching/odor/irritation, problems with periods, bowel movements, urination, or intercourse unless otherwise stated above.  Pertinent History Reviewed:  Reviewed past medical,surgical, social, obstetrical and family history.  Reviewed problem list, medications and allergies. Physical Assessment:   Vitals:   10/11/20 1053  BP: 117/82  Pulse: 95  Weight: 227 lb (103 kg)  Body mass index is 35.55 kg/m.       Physical Examination:   General appearance: alert, well appearing, and in no distress  Mental status: alert, oriented to person, place, and time  Skin: warm & dry   Cardiovascular: normal heart rate noted  Respiratory: normal respiratory effort, no distress  Abdomen: soft, non-tender   Pelvic: normal external genitalia, vulva, vagina, cervix, uterus and adnexa; yellow, lotiony d/c noted at cx  Extremities: no edema   Chaperone: Marcelino Scot    Results for orders placed or performed in visit on 10/11/20 (from the past 24 hour(s))  POC Urinalysis Dipstick OB   Collection Time: 10/11/20 11:28 AM  Result Value Ref Range   Color, UA     Clarity,  UA     Glucose, UA Large (3+) (A) Negative   Bilirubin, UA     Ketones, UA neg    Spec Grav, UA     Blood, UA neg    pH, UA     POC,PROTEIN,UA Negative Negative, Trace, Small (1+), Moderate (2+), Large (3+), 4+   Urobilinogen, UA     Nitrite, UA neg    Leukocytes, UA Moderate (2+) (A) Negative   Appearance     Odor    POCT Wet Prep [facility])   Collection Time: 10/11/20 11:29 AM  Result Value Ref Range   Source Wet Prep POC vag    WBC, Wet Prep HPF POC few    Bacteria Wet Prep HPF POC Few Few   BACTERIA WET PREP MORPHOLOGY POC     Clue Cells Wet Prep HPF POC Moderate (A) None   Clue Cells Wet Prep Whiff POC     Yeast Wet Prep HPF POC None None   KOH Wet Prep POC     Trichomonas Wet Prep HPF POC Absent Absent    Assessment & Plan:  1) Bacterial vaginosis> rx Flagyl x 7d (Diflucan sent in per pt request- she will take if she develops a yeast infection); GC/chlam/trich sent out  2) Recent UTI last month> will do urine culture TOC today  Meds:  Meds ordered this encounter  Medications  . metroNIDAZOLE (FLAGYL) 500 MG tablet    Sig: Take 1 tablet (500 mg total) by mouth 2 (two) times daily.    Dispense:  14 tablet    Refill:  0    Order Specific Question:   Supervising Provider    Answer:   Tania Ade H [[id number]]    Orders Placed This Encounter  Procedures  . POCT Wet Prep [employer])  . POC Urinalysis Dipstick OB    Return for March 2022, Pap & Physical.  Myrtis Ser CNM 10/11/2020 11:30 AM

## 2020-10-12 LAB — CERVICOVAGINAL ANCILLARY ONLY
Chlamydia: NEGATIVE
Comment: NEGATIVE
Comment: NEGATIVE
Comment: NORMAL
Neisseria Gonorrhea: NEGATIVE
Trichomonas: NEGATIVE

## 2020-10-14 LAB — URINE CULTURE

## 2020-10-17 ENCOUNTER — Telehealth: Payer: Self-pay

## 2020-10-17 NOTE — Telephone Encounter (Signed)
Patient notified that [employer] denied coverage of DEXCOM G6 as it needs to billed through Medicare coverage at the Pharmacy, Patient stated that she has lost medicare coverage and is working on getting it reinstated, advised her to contact [employer] to update info, patient verbalized understanding.

## 2020-10-18 ENCOUNTER — Other Ambulatory Visit: Payer: Self-pay

## 2020-10-18 ENCOUNTER — Ambulatory Visit: Payer: Medicaid Other | Attending: Neurology | Admitting: Neurology

## 2020-10-18 DIAGNOSIS — G478 Other sleep disorders: Secondary | ICD-10-CM | POA: Insufficient documentation

## 2020-10-18 DIAGNOSIS — Z7984 Long term (current) use of oral hypoglycemic drugs: Secondary | ICD-10-CM | POA: Insufficient documentation

## 2020-10-18 DIAGNOSIS — Z794 Long term (current) use of insulin: Secondary | ICD-10-CM | POA: Insufficient documentation

## 2020-10-18 DIAGNOSIS — Z79899 Other long term (current) drug therapy: Secondary | ICD-10-CM | POA: Insufficient documentation

## 2020-10-18 DIAGNOSIS — G4752 REM sleep behavior disorder: Secondary | ICD-10-CM | POA: Insufficient documentation

## 2020-10-18 DIAGNOSIS — Z7982 Long term (current) use of aspirin: Secondary | ICD-10-CM | POA: Insufficient documentation

## 2020-10-18 DIAGNOSIS — G4733 Obstructive sleep apnea (adult) (pediatric): Secondary | ICD-10-CM | POA: Diagnosis present

## 2020-10-22 NOTE — Procedures (Signed)
[place] A. Merlene Laughter, MD     [email or web address]             NOCTURNAL POLYSOMNOGRAPHY   LOCATION: [facility]  Patient Name: Yolanda [name], Yolanda [name] Study Date: 10/18/2020 Gender: Female D.O.B: [birth year] Age (years): 61 Referring Provider: Phillips Odor MD, ABSM Height (inches): 67 Interpreting Physician: Phillips Odor MD, ABSM Weight (lbs): 227 RPSGT: Peak, Robert BMI: 36 MRN: [id number] Neck Size: 17.00 CLINICAL INFORMATION Sleep Study Type: NPSG     Indication for sleep study: N/A     Epworth Sleepiness Score: 17     SLEEP STUDY TECHNIQUE As per the AASM Manual for the Scoring of Sleep and Associated Events v2.3 (April 2016) with a hypopnea requiring 4% desaturations.  The channels recorded and monitored were frontal, central and occipital EEG, electrooculogram (EOG), submentalis EMG (chin), nasal and oral airflow, thoracic and abdominal wall motion, anterior tibialis EMG, snore microphone, electrocardiogram, and pulse oximetry.  MEDICATIONS Medications self-administered by patient taken the night of the study : N/A  Current Outpatient Medications:  .  aspirin EC 81 MG EC tablet, Take 1 tablet (81 mg total) by mouth daily. Swallow whole., Disp: 30 tablet, Rfl: 11 .  atorvastatin (LIPITOR) 40 MG tablet, Take 40 mg by mouth at bedtime., Disp: , Rfl:  .  busPIRone (BUSPAR) 15 MG tablet, Take 15 mg by mouth 2 (two) times daily as needed (anxiety). , Disp: , Rfl:  .  clonazePAM (KLONOPIN) 0.5 MG tablet, Take 0.5 mg by mouth 2 (two) times daily as needed for anxiety. , Disp: , Rfl:  .  Continuous Blood Gluc Sensor (DEXCOM G6 SENSOR) MISC, 4 Pieces by Does not apply route once a week., Disp: 4 each, Rfl: 2 .  Continuous Blood Gluc Transmit (DEXCOM G6 TRANSMITTER) MISC, 1 Piece by Does not apply route as directed., Disp: 1 each, Rfl: 1 .  insulin lispro (HUMALOG) 100 UNIT/ML injection, Inject 0.1-0.16 mLs (10-16 Units total) into the skin 3 (three) times  daily with meals., Disp: 10 mL, Rfl: 2 .  metFORMIN (GLUCOPHAGE) 1000 MG tablet, Take 1 tablet (1,000 mg total) by mouth 2 (two) times daily with a meal., Disp: 180 tablet, Rfl: 3 .  metoprolol succinate (TOPROL-XL) 25 MG 24 hr tablet, Take 1 tablet (25 mg total) by mouth daily., Disp: 60 tablet, Rfl: 3 .  metroNIDAZOLE (FLAGYL) 500 MG tablet, Take 1 tablet (500 mg total) by mouth 2 (two) times daily., Disp: 14 tablet, Rfl: 0 .  mirabegron ER (MYRBETRIQ) 50 MG TB24 tablet, Take 1 tablet (50 mg total) by mouth daily. At bedtime, Disp: 30 tablet, Rfl: 11 .  omeprazole (PRILOSEC) 40 MG capsule, Take 40 mg by mouth daily., Disp: , Rfl:  .  Oxycodone HCl 10 MG TABS, Take 10 mg by mouth every 6 (six) hours as needed for pain., Disp: , Rfl:  .  OZEMPIC, 0.25 OR 0.5 MG/DOSE, 2 MG/1.5ML SOPN, Inject 0.5 mg into the skin once a week. Sunday, Disp: , Rfl:  .  PARoxetine (PAXIL) 30 MG tablet, Take 60 mg by mouth at bedtime. , Disp: , Rfl:  .  pregabalin (LYRICA) 300 MG capsule, Take 300 mg by mouth every 12 (twelve) hours., Disp: , Rfl:  .  QUEtiapine (SEROQUEL) 100 MG tablet, Take 100 mg by mouth as needed (mood). , Disp: , Rfl:  .  risperiDONE (RISPERDAL) 1 MG tablet, Take 1 mg by mouth at bedtime., Disp: , Rfl:  .  sacubitril-valsartan (ENTRESTO) 24-26 MG, Take 1 tablet by  mouth 2 (two) times daily., Disp: 60 tablet, Rfl: 6 .  spironolactone (ALDACTONE) 25 MG tablet, Take 0.5 tablets (12.5 mg total) by mouth daily., Disp: 30 tablet, Rfl: 3 .  TRESIBA FLEXTOUCH 200 UNIT/ML SOPN, Inject 70 Units into the skin at bedtime., Disp: , Rfl:  .  TROKENDI XR 200 MG CP24, Take 200 mg by mouth daily. , Disp: , Rfl:      SLEEP ARCHITECTURE The study was initiated at 9:03:54 PM and ended at 5:21:12 AM.  Sleep onset time was 92.4 minutes and the sleep efficiency was 80.8%%. The total sleep time was 401.9 minutes.  Stage REM latency was N/A minutes.  The patient spent 2.9%% of the night in stage N1 sleep, 90.0%% in  stage N2 sleep, 7.1%% in stage N3 and 0% in REM.  Alpha intrusion was absent.  Supine sleep was 0.00%.  RESPIRATORY PARAMETERS The overall apnea/hypopnea index (AHI) was 14.2 per hour. There were 7 total apneas, including 4 obstructive, 3 central and 0 mixed apneas. There were 88 hypopneas and 0 RERAs.  The AHI during Stage REM sleep was N/A per hour.  AHI while supine was N/A per hour.  The mean oxygen saturation was 92.2%. The minimum SpO2 during sleep was 77.0%.  moderate snoring was noted during this study.  CARDIAC DATA The 2 lead EKG demonstrated sinus rhythm. The mean heart rate was 96.3 beats per minute. Other EKG findings include: None.  LEG MOVEMENT DATA The total PLMS were 0 with a resulting PLMS index of 0.0. Associated arousal with leg movement index was 0.1.  IMPRESSIONS 1. Mild to moderate obstructive sleep apnea is noted with this recording.  AutoPap 8-14 is recommended. 2. Abnormal sleep architecture with absent REM sleep and increased N2 non-REM sleep is also noted.   Delano Metz, MD Diplomate, American Board of Sleep Medicine.  ELECTRONICALLY SIGNED ON:  10/22/2020, 4:17 PM [place] PH: [phone]   FX: [phone] [place]

## 2020-10-23 ENCOUNTER — Other Ambulatory Visit (HOSPITAL_COMMUNITY)
Admission: RE | Admit: 2020-10-23 | Discharge: 2020-10-23 | Disposition: A | Discharge status: Home / Self Care | Payer: Medicaid Other | Source: Ambulatory Visit | Attending: Cardiology | Admitting: Cardiology

## 2020-10-23 DIAGNOSIS — Z79899 Other long term (current) drug therapy: Secondary | ICD-10-CM | POA: Diagnosis not present

## 2020-10-23 LAB — BASIC METABOLIC PANEL
Anion gap: 7 (ref 5–15)
BUN: 18 mg/dL (ref 6–20)
CO2: 22 mmol/L (ref 22–32)
Calcium: 8.6 mg/dL — ABNORMAL LOW (ref 8.9–10.3)
Chloride: 106 mmol/L (ref 98–111)
Creatinine, Ser: 0.75 mg/dL (ref 0.44–1.00)
GFR, Estimated: >60 mL/min (ref >60)
Glucose, Bld: 265 mg/dL — ABNORMAL HIGH (ref 70–99)
Potassium: 3.8 mmol/L (ref 3.5–5.1)
Sodium: 135 mmol/L (ref 135–145)

## 2020-10-24 ENCOUNTER — Other Ambulatory Visit: Payer: Self-pay

## 2020-10-24 ENCOUNTER — Encounter: Payer: Self-pay | Admitting: Physician Assistant

## 2020-10-24 ENCOUNTER — Ambulatory Visit (INDEPENDENT_AMBULATORY_CARE_PROVIDER_SITE_OTHER): Payer: Medicaid Other | Admitting: Physician Assistant

## 2020-10-24 VITALS — BP 118/72 | HR 80 | Ht 67.0 in | Wt 233.0 lb

## 2020-10-24 DIAGNOSIS — I2510 Atherosclerotic heart disease of native coronary artery without angina pectoris: Secondary | ICD-10-CM | POA: Diagnosis not present

## 2020-10-24 DIAGNOSIS — E785 Hyperlipidemia, unspecified: Secondary | ICD-10-CM | POA: Diagnosis not present

## 2020-10-24 DIAGNOSIS — E1165 Type 2 diabetes mellitus with hyperglycemia: Secondary | ICD-10-CM

## 2020-10-24 DIAGNOSIS — G894 Chronic pain syndrome: Secondary | ICD-10-CM

## 2020-10-24 DIAGNOSIS — Z79899 Other long term (current) drug therapy: Secondary | ICD-10-CM

## 2020-10-24 DIAGNOSIS — I5181 Takotsubo syndrome: Secondary | ICD-10-CM

## 2020-10-24 MED ORDER — ENTRESTO 49-51 MG PO TABS: 1.0000 | ORAL_TABLET | Freq: Two times a day (BID) | ORAL | 11 refills | Status: DC

## 2020-10-24 NOTE — Patient Instructions (Addendum)
Medication Instructions:  Your physician has recommended you make the following change in your medication:   Increase Entresto to 49-51 mg Two Times Daily ( You have been given samples today).   *If you need a refill on your cardiac medications before your next appointment, please call your pharmacy*   Lab Work: Your physician recommends that you return for lab work in: 2 Weeks   If you have labs (blood work) drawn today and your tests are completely normal, you will receive your results only by: [name] MyChart Message (if you have MyChart) OR . A paper copy in the mail If you have any lab test that is abnormal or we need to change your treatment, we will call you to review the results.   Testing/Procedures: Thank you for choosing [facility]!     Follow-Up: At [facility], you and your health needs are our priority.  As part of our continuing mission to provide you with exceptional heart care, we have created designated Provider Care Teams.  These Care Teams include your primary Cardiologist (physician) and Advanced Practice Providers (APPs -  Physician Assistants and Nurse Practitioners) who all work together to provide you with the care you need, when you need it.  We recommend signing up for the patient portal called "MyChart".  Sign up information is provided on this After Visit Summary.  MyChart is used to connect with patients for Virtual Visits (Telemedicine).  Patients are able to view lab/test results, encounter notes, upcoming appointments, etc.  Non-urgent messages can be sent to your provider as well.   To learn more about what you can do with MyChart, go to [email or web address].    Your next appointment:   2 week(s)  The format for your next appointment:   In Person  Provider:   Ermalinda Barrios, PA-C   Other Instructions Thank you for choosing [facility]!

## 2020-11-01 ENCOUNTER — Telehealth: Payer: Self-pay | Admitting: Advanced Practice Midwife

## 2020-11-01 NOTE — Telephone Encounter (Signed)
Patient called stating that she came into the office not to long ago for a BV and she was given medication for this, but she states that her Provider also given her Diflucan just incase it was a yeast infection. Pt states that she is not having a different type of discharge and she is needing a refill of the Diflucan. Please contact pt

## 2020-11-01 NOTE — Telephone Encounter (Signed)
Pt has an appt scheduled. [facility]

## 2020-11-06 ENCOUNTER — Other Ambulatory Visit: Payer: Self-pay

## 2020-11-06 ENCOUNTER — Other Ambulatory Visit (HOSPITAL_COMMUNITY)
Admission: RE | Admit: 2020-11-06 | Discharge: 2020-11-06 | Disposition: A | Discharge status: Home / Self Care | Payer: Medicaid Other | Source: Ambulatory Visit | Attending: Physician Assistant | Admitting: Physician Assistant

## 2020-11-06 ENCOUNTER — Encounter: Payer: Self-pay | Admitting: Nutrition

## 2020-11-06 ENCOUNTER — Encounter: Payer: Medicaid Other | Attending: "Endocrinology | Admitting: Nutrition

## 2020-11-06 VITALS — Ht 67.0 in | Wt 230.0 lb

## 2020-11-06 DIAGNOSIS — E1159 Type 2 diabetes mellitus with other circulatory complications: Secondary | ICD-10-CM

## 2020-11-06 DIAGNOSIS — Z79899 Other long term (current) drug therapy: Secondary | ICD-10-CM | POA: Diagnosis present

## 2020-11-06 DIAGNOSIS — E782 Mixed hyperlipidemia: Secondary | ICD-10-CM | POA: Insufficient documentation

## 2020-11-06 DIAGNOSIS — I10 Essential (primary) hypertension: Secondary | ICD-10-CM | POA: Diagnosis present

## 2020-11-06 DIAGNOSIS — G459 Transient cerebral ischemic attack, unspecified: Secondary | ICD-10-CM | POA: Insufficient documentation

## 2020-11-06 DIAGNOSIS — I252 Old myocardial infarction: Secondary | ICD-10-CM | POA: Diagnosis present

## 2020-11-06 LAB — BASIC METABOLIC PANEL
Anion gap: 8 (ref 5–15)
BUN: 30 mg/dL — ABNORMAL HIGH (ref 6–20)
CO2: 21 mmol/L — ABNORMAL LOW (ref 22–32)
Calcium: 9 mg/dL (ref 8.9–10.3)
Chloride: 105 mmol/L (ref 98–111)
Creatinine, Ser: 0.64 mg/dL (ref 0.44–1.00)
GFR, Estimated: >60 mL/min (ref >60)
Glucose, Bld: 294 mg/dL — ABNORMAL HIGH (ref 70–99)
Potassium: 4.4 mmol/L (ref 3.5–5.1)
Sodium: 134 mmol/L — ABNORMAL LOW (ref 135–145)

## 2020-11-06 NOTE — Progress Notes (Signed)
Medical Nutrition Therapy:  Appt start time: 2863 end time:  1400  Assessment:  Primary concerns today: Diabetes Type 2, Obesity, heart attack, Hyperlipidemia and HTN.  Sees Dr.[name], Endocrinology.  Last A1C 11.1%  Taking Metformin 1000 mg BID, Tresiba 70 units at night , Ozempic and has been taking 10 units of  Humalog with meals plus sliding scale. 7 day 254 mg/dl. 14 day avg 300 mg/dl.30 day 226 mg/dl. She notes her BS are high due to the emotional stress she is going through with her ex husband and her son. She notes she has gotten back into some bad habits with eating  See therapist Dec 14 th and psychiatrist on the Dec the 21 st. Feels like her emotions and stress are effecting her the most with her blood sugars and eating habits.[place] Scrape like she is having a lot depression due to missing her sister. She thinks she needs an increase in her depression medication.  Feels like she has been eating more carbs. Has been eating snacks between meals.  Has been getting stronger and not using a cane or walker now.  Willing to get back on track with meals and foods to improve her blood sugars.  Lab Results  Component Value Date   HGBA1C 11.1 (H) 09/16/2020   CMP Latest Ref Rng & Units 10/23/2020 09/22/2020 09/21/2020  Glucose 70 - 99 mg/dL 265(H) 127(H) 99  BUN 6 - 20 mg/dL 18 14 10   Creatinine 0.44 - 1.00 mg/dL 0.75 0.77 0.75  Sodium 135 - 145 mmol/L 135 138 140  Potassium 3.5 - 5.1 mmol/L 3.8 3.5 3.8  Chloride 98 - 111 mmol/L 106 104 106  CO2 22 - 32 mmol/L 22 23 23   Calcium 8.9 - 10.3 mg/dL 8.6(L) 9.0 9.2  Total Protein 6.5 - 8.1 g/dL - 6.2(L) 6.4(L)  Total Bilirubin 0.3 - 1.2 mg/dL - 0.7 0.9  Alkaline Phos 38 - 126 U/L - 145(H) 171(H)  AST 15 - 41 U/L - 24 36  ALT 0 - 44 U/L - 105(H) 160(H)     Lab Results  Component Value Date   HGBA1C 11.1 (H) 09/16/2020   CMP Latest Ref Rng & Units 10/23/2020 09/22/2020 09/21/2020  Glucose 70 - 99 mg/dL 265(H) 127(H) 99  BUN 6 - 20  mg/dL 18 14 10   Creatinine 0.44 - 1.00 mg/dL 0.75 0.77 0.75  Sodium 135 - 145 mmol/L 135 138 140  Potassium 3.5 - 5.1 mmol/L 3.8 3.5 3.8  Chloride 98 - 111 mmol/L 106 104 106  CO2 22 - 32 mmol/L 22 23 23   Calcium 8.9 - 10.3 mg/dL 8.6(L) 9.0 9.2  Total Protein 6.5 - 8.1 g/dL - 6.2(L) 6.4(L)  Total Bilirubin 0.3 - 1.2 mg/dL - 0.7 0.9  Alkaline Phos 38 - 126 U/L - 145(H) 171(H)  AST 15 - 41 U/L - 24 36  ALT 0 - 44 U/L - 105(H) 160(H)   Lipid Panel     Component Value Date/Time   CHOL 104 09/21/2020 0134   TRIG 170 (H) 09/21/2020 0134   HDL 18 (L) 09/21/2020 0134   CHOLHDL 5.8 09/21/2020 0134   VLDL 34 09/21/2020 0134   LDLCALC 52 09/21/2020 0134    Preferred Learning Style:   No preference indicated   Learning Readiness:   Ready  Change in progress   MEDICATIONS:    DIETARY INTAKE:  24-hr recall:  B ( AM): equate protein shakes  L)  Same as dinner , pintos 1/2 c and  collards, water D) Meatloaf, fresh collards,  Water Beverages: water  Usual physical activity:  Just  Walking 10 minutes a day around her parking lot.  Estimated energy needs: 1200  calories 135 g carbohydrates 90 g protein 33 g fat  Progress Towards Goal(s):  In progress.   Nutritional Diagnosis:  NB-1.1 Food and nutrition-related knowledge deficit As related to Diabetes Type 2, CVD.  As evidenced by A1C 11.1% and recent HA and CVA.[name]    Intervention:  Nutrition and Diabetes education provided on My Plate, CHO counting, meal planning, portion sizes, timing of meals, avoiding snacks between meals unless having a low blood sugar, target ranges for A1C and blood sugars, signs/symptoms and treatment of hyper/hypoglycemia, monitoring blood sugars, taking medications as prescribed, benefits of exercising 30 minutes per day and prevention of complications of DM.  Goals  Drink 5- 16 oz bottles of water per day. Cut out snacks between meals Don't eat past 7 pm. Increase lower carb vegetables. Increase  walking as tolerated. Get FBS less than 150 and less than 150 at bedtime.  Teaching Method Utilized:  Visual Auditory Hands on  Handouts given during visit include:  The Plate Method  Meal Plan Card  Diabetes Instructions.     Barriers to learning/adherence to lifestyle change: None  Demonstrated degree of understanding via:  Teach Back   Monitoring/Evaluation:  Dietary intake, exercise,  and body weight in 1 month.. Recommend to try to order the Elenor Legato since she doesn't have phone compatible with [facility].

## 2020-11-06 NOTE — Patient Instructions (Addendum)
Goals  Drink 5- 16 oz bottles of water per day. Cut out snacks between meals Don't eat past 7 pm. Increase lower carb vegetables. Increase walking as tolerated. Get FBS less than 150 and less than 150 at bedtime.

## 2020-11-07 ENCOUNTER — Encounter: Payer: Self-pay | Admitting: Student

## 2020-11-07 ENCOUNTER — Ambulatory Visit (INDEPENDENT_AMBULATORY_CARE_PROVIDER_SITE_OTHER): Payer: Medicaid Other | Admitting: Student

## 2020-11-07 VITALS — BP 90/62 | HR 98 | Ht 67.0 in | Wt 230.0 lb

## 2020-11-07 DIAGNOSIS — E1165 Type 2 diabetes mellitus with hyperglycemia: Secondary | ICD-10-CM

## 2020-11-07 DIAGNOSIS — I5042 Chronic combined systolic (congestive) and diastolic (congestive) heart failure: Secondary | ICD-10-CM

## 2020-11-07 DIAGNOSIS — I10 Essential (primary) hypertension: Secondary | ICD-10-CM | POA: Diagnosis not present

## 2020-11-07 DIAGNOSIS — I2510 Atherosclerotic heart disease of native coronary artery without angina pectoris: Secondary | ICD-10-CM

## 2020-11-07 DIAGNOSIS — E785 Hyperlipidemia, unspecified: Secondary | ICD-10-CM
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
## 2020-11-07 NOTE — Patient Instructions (Signed)
Medication Instructions:  Your physician recommends that you continue on your current medications as directed. Please refer to the Current Medication list given to you today.  *If you need a refill on your cardiac medications before your next appointment, please call your pharmacy*   Lab Work: None today If you have labs (blood work) drawn today and your tests are completely normal, you will receive your results only by: [name] MyChart Message (if you have MyChart) OR . A paper copy in the mail If you have any lab test that is abnormal or we need to change your treatment, we will call you to review the results.   Testing/Procedures: Your physician has requested that you have an echocardiogram ( IN 2 MONTHS). Echocardiography is a painless test that uses sound waves to create images of your heart. It provides your doctor with information about the size and shape of your heart and how well your heart's chambers and valves are working. This procedure takes approximately one hour. There are no restrictions for this procedure.     Follow-Up: At [facility], you and your health needs are our priority.  As part of our continuing mission to provide you with exceptional heart care, we have created designated Provider Care Teams.  These Care Teams include your primary Cardiologist (physician) and Advanced Practice Providers (APPs -  Physician Assistants and Nurse Practitioners) who all work together to provide you with the care you need, when you need it.  We recommend signing up for the patient portal called "MyChart".  Sign up information is provided on this After Visit Summary.  MyChart is used to connect with patients for Virtual Visits (Telemedicine).  Patients are able to view lab/test results, encounter notes, upcoming appointments, etc.  Non-urgent messages can be sent to your provider as well.   To learn more about what you can do with MyChart, go to [email or web address].    Your next  appointment:   2 month(s)  The format for your next appointment:   In Person  Provider:   Carlyle Dolly, MD, Bernerd Pho, PA-C or Ermalinda Barrios, PA-C   Other Instructions None      Thank you for choosing [place] !

## 2020-11-08 ENCOUNTER — Ambulatory Visit: Payer: Medicaid Other | Admitting: Advanced Practice Midwife

## 2020-11-10 ENCOUNTER — Telehealth: Payer: Self-pay

## 2020-11-10 ENCOUNTER — Other Ambulatory Visit: Payer: Self-pay | Admitting: "Endocrinology

## 2020-11-10 MED ORDER — ONETOUCH VERIO VI STRP: ORAL_STRIP | 2 refills | Status: AC

## 2020-11-10 NOTE — Telephone Encounter (Signed)
Patient called and lost her meter with her insulin in it. She asked if we could give her samples. We did not have any humalog or novolog, per Dr Dorris Fetch he said to give her 2 boxes of Lyumjev.

## 2020-11-10 NOTE — Telephone Encounter (Signed)
I gave patient a One Touch Verio meter and some strips from the office. Can you call her in some strips for that? [employer]. She said she is testing 5 times a day and not using the dexcom

## 2020-11-10 NOTE — Telephone Encounter (Signed)
done

## 2020-11-13 ENCOUNTER — Ambulatory Visit: Payer: Medicaid Other | Admitting: Nutrition

## 2020-11-14 ENCOUNTER — Encounter: Payer: Self-pay | Admitting: Nutrition

## 2020-11-15 ENCOUNTER — Ambulatory Visit (INDEPENDENT_AMBULATORY_CARE_PROVIDER_SITE_OTHER): Payer: Medicaid Other | Admitting: Advanced Practice Midwife

## 2020-11-15 ENCOUNTER — Other Ambulatory Visit: Payer: Self-pay

## 2020-11-15 ENCOUNTER — Encounter: Payer: Self-pay | Admitting: Advanced Practice Midwife

## 2020-11-15 VITALS — BP 111/70 | HR 111 | Wt 243.8 lb

## 2020-11-15 DIAGNOSIS — N898 Other specified noninflammatory disorders of vagina: Secondary | ICD-10-CM

## 2020-11-15 DIAGNOSIS — R300 Dysuria: Secondary | ICD-10-CM

## 2020-11-15 DIAGNOSIS — B3731 Acute candidiasis of vulva and vagina: Secondary | ICD-10-CM

## 2020-11-15 DIAGNOSIS — B373 Candidiasis of vulva and vagina: Secondary | ICD-10-CM | POA: Diagnosis not present

## 2020-11-15 LAB — POCT URINALYSIS DIPSTICK
Blood, UA: NEGATIVE
Glucose, UA: NEGATIVE
Ketones, UA: NEGATIVE
Nitrite, UA: NEGATIVE
Protein, UA: NEGATIVE

## 2020-11-15 LAB — POCT URINALYSIS DIPSTICK OB
Blood, UA: NEGATIVE
Glucose, UA: NEGATIVE
Ketones, UA: NEGATIVE
Leukocytes, UA: NEGATIVE
Nitrite, UA: NEGATIVE
POC,PROTEIN,UA: NEGATIVE

## 2020-11-15 LAB — POCT WET PREP (WET MOUNT): Trichomonas Wet Prep HPF POC: ABSENT

## 2020-11-15 MED ORDER — FLUCONAZOLE 150 MG PO TABS: 150.0000 mg | ORAL_TABLET | Freq: Once | ORAL | 3 refills | Status: AC
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
## 2020-11-15 NOTE — Patient Instructions (Signed)
Vaginal Yeast Infection, Adult  Vaginal yeast infection is a condition that causes vaginal discharge as well as soreness, swelling, and redness (inflammation) of the vagina. This is a common condition. Some women get this infection frequently. What are the causes? This condition is caused by a change in the normal balance of the yeast (candida) and bacteria that live in the vagina. This change causes an overgrowth of yeast, which causes the inflammation. What increases the risk? The condition is more likely to develop in women who:  Take antibiotic medicines.  Have diabetes.  Take birth control pills.  Are pregnant.  Douche often.  Have a weak body defense system (immune system).  Have been taking steroid medicines for a long time.  Frequently wear tight clothing. What are the signs or symptoms? Symptoms of this condition include:  White, thick, creamy vaginal discharge.  Swelling, itching, redness, and irritation of the vagina. The lips of the vagina (vulva) may be affected as well.  Pain or a burning feeling while urinating.  Pain during sex. How is this diagnosed? This condition is diagnosed based on:  Your medical history.  A physical exam.  A pelvic exam. Your health care provider will examine a sample of your vaginal discharge under a microscope. Your health care provider may send this sample for testing to confirm the diagnosis. How is this treated? This condition is treated with medicine. Medicines may be over-the-counter or prescription. You may be told to use one or more of the following:  Medicine that is taken by mouth (orally).  Medicine that is applied as a cream (topically).  Medicine that is inserted directly into the vagina (suppository). Follow these instructions at home:  Lifestyle  Do not have sex until your health care provider approves. Tell your sex partner that you have a yeast infection. That person should go to his or her health care  provider and ask if they should also be treated.  Do not wear tight clothes, such as pantyhose or tight pants.  Wear breathable cotton underwear. General instructions  Take or apply over-the-counter and prescription medicines only as told by your health care provider.  Eat more yogurt. This may help to keep your yeast infection from returning.  Do not use tampons until your health care provider approves.  Try taking a sitz bath to help with discomfort. This is a warm water bath that is taken while you are sitting down. The water should only come up to your hips and should cover your buttocks. Do this 3-4 times per day or as told by your health care provider.  Do not douche.  If you have diabetes, keep your blood sugar levels under control.  Keep all follow-up visits as told by your health care provider. This is important. Contact a health care provider if:  You have a fever.  Your symptoms go away and then return.  Your symptoms do not get better with treatment.  Your symptoms get worse.  You have new symptoms.  You develop blisters in or around your vagina.  You have blood coming from your vagina and it is not your menstrual period.  You develop pain in your abdomen. Summary  Vaginal yeast infection is a condition that causes discharge as well as soreness, swelling, and redness (inflammation) of the vagina.  This condition is treated with medicine. Medicines may be over-the-counter or prescription.  Take or apply over-the-counter and prescription medicines only as told by your health care provider.  Do not douche.  Do not have sex or use tampons until your health care provider approves.  Contact a health care provider if your symptoms do not get better with treatment or your symptoms go away and then return. This information is not intended to replace advice given to you by your health care provider. Make sure you discuss any questions you have with your health care  provider. Document Revised: 06/18/2019 Document Reviewed: 04/06/2018 Elsevier Patient Education  [place].

## 2020-11-27 ENCOUNTER — Telehealth: Payer: Self-pay

## 2020-11-27 NOTE — Telephone Encounter (Signed)
Entresto approved through [facility] from 10/21/20 through 10/22/2021

## 2020-11-28 ENCOUNTER — Telehealth: Payer: Self-pay

## 2020-11-28 DIAGNOSIS — E1159 Type 2 diabetes mellitus with other circulatory complications: Secondary | ICD-10-CM

## 2020-11-28 MED ORDER — LYUMJEV KWIKPEN 100 UNIT/ML ~~LOC~~ SOPN: 10.0000 [IU] | PEN_INJECTOR | Freq: Three times a day (TID) | SUBCUTANEOUS | 1 refills | Status: DC

## 2020-11-28 NOTE — Telephone Encounter (Signed)
She is asking for a RX to be sent in to [facility]

## 2020-11-28 NOTE — Telephone Encounter (Signed)
Sent in script

## 2020-11-28 NOTE — Telephone Encounter (Signed)
Pt said she needs a refill on the insulin you gave her in the office last visit. She said it was a sample. She said it is not humalog. She thinks it started with a " L ". I was not sure and did not see it on the sign in sheet near the samples

## 2020-11-28 NOTE — Telephone Encounter (Signed)
It is Lyumjev , I think we have some.

## 2020-12-03 ENCOUNTER — Encounter: Payer: Self-pay | Admitting: Obstetrics & Gynecology
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
## 2020-12-13 ENCOUNTER — Other Ambulatory Visit (HOSPITAL_COMMUNITY)
Admission: RE | Admit: 2020-12-13 | Discharge: 2020-12-13 | Disposition: A | Discharge status: Home / Self Care | Payer: Medicaid Other | Source: Ambulatory Visit | Attending: Physician Assistant | Admitting: Physician Assistant

## 2020-12-13 ENCOUNTER — Ambulatory Visit (INDEPENDENT_AMBULATORY_CARE_PROVIDER_SITE_OTHER): Payer: Medicaid Other | Admitting: "Endocrinology

## 2020-12-13 ENCOUNTER — Other Ambulatory Visit: Payer: Self-pay | Admitting: "Endocrinology

## 2020-12-13 ENCOUNTER — Encounter: Payer: Self-pay | Admitting: Physician Assistant

## 2020-12-13 ENCOUNTER — Other Ambulatory Visit: Payer: Self-pay

## 2020-12-13 ENCOUNTER — Encounter: Payer: Medicaid Other | Attending: "Endocrinology | Admitting: Nutrition

## 2020-12-13 ENCOUNTER — Encounter: Payer: Self-pay | Admitting: Nutrition

## 2020-12-13 ENCOUNTER — Ambulatory Visit (INDEPENDENT_AMBULATORY_CARE_PROVIDER_SITE_OTHER): Payer: Medicaid Other | Admitting: Physician Assistant

## 2020-12-13 ENCOUNTER — Ambulatory Visit: Payer: Medicaid Other | Admitting: "Endocrinology

## 2020-12-13 ENCOUNTER — Encounter: Payer: Self-pay | Admitting: "Endocrinology

## 2020-12-13 VITALS — BP 118/88 | HR 104 | Resp 16 | Ht 67.0 in | Wt 219.6 lb

## 2020-12-13 VITALS — Wt 238.0 lb

## 2020-12-13 VITALS — BP 118/80 | HR 99 | Ht 67.0 in | Wt 238.0 lb

## 2020-12-13 DIAGNOSIS — G459 Transient cerebral ischemic attack, unspecified: Secondary | ICD-10-CM | POA: Insufficient documentation

## 2020-12-13 DIAGNOSIS — E785 Hyperlipidemia, unspecified: Secondary | ICD-10-CM

## 2020-12-13 DIAGNOSIS — E782 Mixed hyperlipidemia: Secondary | ICD-10-CM | POA: Diagnosis not present

## 2020-12-13 DIAGNOSIS — I252 Old myocardial infarction: Secondary | ICD-10-CM | POA: Insufficient documentation

## 2020-12-13 DIAGNOSIS — R0602 Shortness of breath: Secondary | ICD-10-CM | POA: Diagnosis not present

## 2020-12-13 DIAGNOSIS — I5181 Takotsubo syndrome: Secondary | ICD-10-CM

## 2020-12-13 DIAGNOSIS — I10 Essential (primary) hypertension: Secondary | ICD-10-CM

## 2020-12-13 DIAGNOSIS — I5022 Chronic systolic (congestive) heart failure: Secondary | ICD-10-CM

## 2020-12-13 DIAGNOSIS — E1159 Type 2 diabetes mellitus with other circulatory complications: Secondary | ICD-10-CM

## 2020-12-13 DIAGNOSIS — G894 Chronic pain syndrome: Secondary | ICD-10-CM

## 2020-12-13 DIAGNOSIS — I2510 Atherosclerotic heart disease of native coronary artery without angina pectoris: Secondary | ICD-10-CM

## 2020-12-13 DIAGNOSIS — Z7189 Other specified counseling: Secondary | ICD-10-CM | POA: Diagnosis present

## 2020-12-13 DIAGNOSIS — Z9119 Patient's noncompliance with other medical treatment and regimen: Secondary | ICD-10-CM | POA: Diagnosis not present

## 2020-12-13 DIAGNOSIS — I739 Peripheral vascular disease, unspecified: Secondary | ICD-10-CM

## 2020-12-13 DIAGNOSIS — Z91199 Patient's noncompliance with other medical treatment and regimen due to unspecified reason: Secondary | ICD-10-CM

## 2020-12-13 LAB — COMPREHENSIVE METABOLIC PANEL
ALT: 21 U/L (ref 0–44)
AST: 17 U/L (ref 15–41)
Albumin: 3.5 g/dL (ref 3.5–5.0)
Alkaline Phosphatase: 83 U/L (ref 38–126)
Anion gap: 8 (ref 5–15)
BUN: 18 mg/dL (ref 6–20)
CO2: 28 mmol/L (ref 22–32)
Calcium: 9 mg/dL (ref 8.9–10.3)
Chloride: 100 mmol/L (ref 98–111)
Creatinine, Ser: 0.66 mg/dL (ref 0.44–1.00)
GFR, Estimated: >60 mL/min (ref >60)
Glucose, Bld: 179 mg/dL — ABNORMAL HIGH (ref 70–99)
Potassium: 4.3 mmol/L (ref 3.5–5.1)
Sodium: 136 mmol/L (ref 135–145)
Total Bilirubin: 0.7 mg/dL (ref 0.3–1.2)
Total Protein: 7.2 g/dL (ref 6.5–8.1)

## 2020-12-13 LAB — POCT GLYCOSYLATED HEMOGLOBIN (HGB A1C): HbA1c, POC (controlled diabetic range): 10.8 % — AB (ref 0.0–7.0)

## 2020-12-13 LAB — TSH: TSH: 1.798 u[IU]/mL (ref 0.350–4.500)

## 2020-12-13 LAB — CBC
HCT: 41.4 % (ref 36.0–46.0)
Hemoglobin: 13 g/dL (ref 12.0–15.0)
MCH: 26.7 pg (ref 26.0–34.0)
MCHC: 31.4 g/dL (ref 30.0–36.0)
MCV: 85.2 fL (ref 80.0–100.0)
Platelets: 299 10*3/uL (ref 150–400)
RBC: 4.86 MIL/uL (ref 3.87–5.11)
RDW: 14.3 % (ref 11.5–15.5)
WBC: 9.5 10*3/uL (ref 4.0–10.5)
nRBC: 0 % (ref 0.0–0.2)

## 2020-12-13 MED ORDER — DEXCOM G6 RECEIVER DEVI: 1.0000 | 0 refills | Status: DC | PRN

## 2020-12-13 MED ORDER — DEXCOM G6 SENSOR MISC: 4.0000 | 2 refills | Status: DC

## 2020-12-13 MED ORDER — METOPROLOL SUCCINATE ER 50 MG PO TB24: 50.0000 mg | ORAL_TABLET | Freq: Every day | ORAL | 3 refills | Status: DC

## 2020-12-13 MED ORDER — DEXCOM G6 TRANSMITTER MISC: 1.0000 | 1 refills | Status: DC
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
## 2020-12-13 NOTE — Patient Instructions (Addendum)
Medication Instructions:  1. Increase your metoprolol succinate (Toprol XL) to 50 mg by mouth daily.  *If you need a refill on your cardiac medications before your next appointment, please call your pharmacy*   Lab Work: CBC, CMET and a TSH today If you have labs (blood work) drawn today and your tests are completely normal, you will receive your results only by: [name] MyChart Message (if you have MyChart) OR . A paper copy in the mail If you have any lab test that is abnormal or we need to change your treatment, we will call you to review the results.   Testing/Procedures: None   Follow-Up: At [facility], you and your health needs are our priority.  As part of our continuing mission to provide you with exceptional heart care, we have created designated Provider Care Teams.  These Care Teams include your primary Cardiologist (physician) and Advanced Practice Providers (APPs -  Physician Assistants and Nurse Practitioners) who all work together to provide you with the care you need, when you need it.    Your next appointment:   January 09, 2021 at 12:30 PM  The format for your next appointment:   In Person  Provider:   Ermalinda Barrios, PA-C   Other Instructions  [id number] Address (click for [employer] result) [address] [place], [place] [address] Contact Info [phone]; Register Online Other Info DHHS-sponsored event, Able to test children

## 2020-12-13 NOTE — Patient Instructions (Signed)
Goals set by patient.  Eat breakfast by 8 am, lunch 12-2 and dinner 5-7 pm. Drink 6 bottles of water per day- 100 oz. Increase more lower carb vegetables. Cut out snacks-veggies only or water. Test blood sugar before meals and take meal time insulin as prescribed. Keep working with therapist.

## 2020-12-13 NOTE — Progress Notes (Signed)
Medical Nutrition Therapy:  Appt start time: 1157 end time:  1400 This visit was completed via telephone due to the COVID-19 pandemic.   I spoke with Yolanda [name] and verified that I was speaking with the correct person with two patient identifiers (full name and date of birth).   I discussed the limitations related to this kind of visit and the patient is willing to proceed.  Assessment:  Primary concerns today: Diabetes Type 2, Obesity, heart attack, Hyperlipidemia and HTN.  Sees Dr.[name], Endocrinology.  Last A1C 11.1%  Admits she is not eating right. Has gained 8 lbs. She isn't testing her blood sugars 4 times per day like she was prescribed to do. Tresiba 70 units with Humalog 10 units plus sliding scale. BS before lunch: 183 mg/dl  11 units. Ate a bowl of soup., 6 crackers.,water 16 oz She notes she needs to get back on time schedule. She notes she has a problem self harming by not eating and not taking her medication. due to depressed- had a therapy session started yesterday.   She doesn't sleep at night. She tends to eat at night. She bought some more peppers to munch on at night instead of ice cream and other snackst.  She doesn't have a car to drive. Uses transportation.  She notes she is motivated to get back on track and take better care of herself. She likes her new therapist.  Taking Metformin 1000 mg BID, Tresiba 70 units at night , Ozempic and has been taking 10 units of  Humalog with meals plus sliding scale.  Saw Dr. Dorris Fetch today in office. Going to see if she can get a dexcom. Lab Results  Component Value Date   HGBA1C 10.8 (A) 12/13/2020   CMP Latest Ref Rng & Units 12/13/2020 11/06/2020 10/23/2020  Glucose 70 - 99 mg/dL 179(H) 294(H) 265(H)  BUN 6 - 20 mg/dL 18 30(H) 18  Creatinine 0.44 - 1.00 mg/dL 0.66 0.64 0.75  Sodium 135 - 145 mmol/L 136 134(L) 135  Potassium 3.5 - 5.1 mmol/L 4.3 4.4 3.8  Chloride 98 - 111 mmol/L 100 105 106  CO2 22 - 32 mmol/L 28 21(L) 22   Calcium 8.9 - 10.3 mg/dL 9.0 9.0 8.6(L)  Total Protein 6.5 - 8.1 g/dL 7.2 - -  Total Bilirubin 0.3 - 1.2 mg/dL 0.7 - -  Alkaline Phos 38 - 126 U/L 83 - -  AST 15 - 41 U/L 17 - -  ALT 0 - 44 U/L 21 - -    Lipid Panel     Component Value Date/Time   CHOL 104 09/21/2020 0134   TRIG 170 (H) 09/21/2020 0134   HDL 18 (L) 09/21/2020 0134   CHOLHDL 5.8 09/21/2020 0134   VLDL 34 09/21/2020 0134   LDLCALC 52 09/21/2020 0134    Preferred Learning Style:   No preference indicated   Learning Readiness:   Ready  Change in progress   MEDICATIONS:    DIETARY INTAKE:  24-hr recall:  B ( AM): equate protein shakes  L)  Same as dinner , pintos 1/2 c and collards, water D) Meatloaf, fresh collards,  Water Beverages: water  Usual physical activity:  Just  Walking 10 minutes a day around her parking lot.  Estimated energy needs: 1200  calories 135 g carbohydrates 90 g protein 33 g fat  Progress Towards Goal(s):  In progress.   Nutritional Diagnosis:  NB-1.1 Food and nutrition-related knowledge deficit As related to Diabetes Type 2, CVD.  As  evidenced by A1C 11.1% and recent HA and CVA.[name]    Intervention:  Nutrition and Diabetes education provided on My Plate, CHO counting, meal planning, portion sizes, timing of meals, avoiding snacks between meals unless having a low blood sugar, target ranges for A1C and blood sugars, signs/symptoms and treatment of hyper/hypoglycemia, monitoring blood sugars, taking medications as prescribed, benefits of exercising 30 minutes per day and prevention of complications of DM.   Goals set by patient.  Eat breakfast by 8 am, lunch 12-2 and dinner 5-7 pm. Drink 6 bottles of water per day- 100 oz. Increase more lower carb vegetables. Cut out snacks-veggies only or water. Test blood sugar before meals and take meal time insulin as prescribed. Keep working with therapist.  Teaching Method Utilized:  Visual Auditory Hands on  Handouts given  during visit include:  The Plate Method  Meal Plan Card  Diabetes Instructions.     Barriers to learning/adherence to lifestyle change: None  Demonstrated degree of understanding via:  Teach Back   Monitoring/Evaluation:  Dietary intake, exercise,  and body weight in 2 month.[name]

## 2020-12-13 NOTE — Patient Instructions (Signed)
                                     Advice for Weight Management  -For most of us the best way to lose weight is by diet management. Generally speaking, diet management means consuming less calories intentionally which over time brings about progressive weight loss.  This can be achieved more effectively by restricting carbohydrate consumption to the minimum possible.  So, it is critically important to know your numbers: how much calorie you are consuming and how much calorie you need. More importantly, our carbohydrates sources should be unprocessed or minimally processed complex starch food items.   Sometimes, it is important to balance nutrition by increasing protein intake (animal or plant source), fruits, and vegetables.  -Sticking to a routine mealtime to eat 3 meals a day and avoiding unnecessary snacks is shown to have a big role in weight control. Under normal circumstances, the only time we lose real weight is when we are hungry, so allow hunger to take place- hunger means no food between meal times, only water.  It is not advisable to starve.   -It is better to avoid simple carbohydrates including: Cakes, Sweet Desserts, Ice Cream, Soda (diet and regular), Sweet Tea, Candies, Chips, Cookies, Store Bought Juices, Alcohol in Excess of  1-2 drinks a day, Lemonade,  Artificial Sweeteners, Doughnuts, Coffee Creamers, "Sugar-free" Products, etc, etc.  This is not a complete list.....    -Consulting with certified diabetes educators is proven to provide you with the most accurate and current information on diet.  Also, you may be  interested in discussing diet options/exchanges , we can schedule a visit with Margrit Crumpton, RDN, CDE for individualized nutrition education.  -Exercise: If you are able: 30 -60 minutes a day ,4 days a week, or 150 minutes a week.  The longer the better.  Combine stretch, strength, and aerobic activities.  If you were told in the  past that you have high risk for cardiovascular diseases, you may seek evaluation by your heart doctor prior to initiating moderate to intense exercise programs.                                  Additional Care Considerations for Diabetes   -Diabetes  is a chronic disease.  The most important care consideration is regular follow-up with your diabetes care provider with the goal being avoiding or delaying its complications and to take advantage of advances in medications and technology.    -Type 2 diabetes is known to coexist with other important comorbidities such as high blood pressure and high cholesterol.  It is critical to control not only the diabetes but also the high blood pressure and high cholesterol to minimize and delay the risk of complications including coronary artery disease, stroke, amputations, blindness, etc.    - Studies showed that people with diabetes will benefit from a class of medications known as ACE inhibitors and statins.  Unless there are specific reasons not to be on these medications, the standard of care is to consider getting one from these groups of medications at an optimal doses.  These medications are generally considered safe and proven to help protect the heart and the kidneys.    - People with diabetes are encouraged to initiate and maintain regular follow-up with eye doctors, foot   doctors, dentists , and if necessary heart and kidney doctors.     - It is highly recommended that people with diabetes quit smoking or stay away from smoking, and get yearly  flu vaccine and pneumonia vaccine at least every 5 years.  One other important lifestyle recommendation is to ensure adequate sleep - at least 6-7 hours of uninterrupted sleep at night.  -Exercise: If you are able: 30 -60 minutes a day, 4 days a week, or 150 minutes a week.  The longer the better.  Combine stretch, strength, and aerobic activities.  If you were told in the past that you have high risk for  cardiovascular diseases, you may seek evaluation by your heart doctor prior to initiating moderate to intense exercise programs.          

## 2020-12-14 ENCOUNTER — Other Ambulatory Visit: Payer: Medicaid Other

## 2020-12-14 DIAGNOSIS — Z20822 Contact with and (suspected) exposure to covid-19: Secondary | ICD-10-CM

## 2020-12-14 NOTE — Addendum Note (Signed)
Addended by: Levonne Hubert on: 12/14/2020 10:34 AM   Modules accepted: Orders

## 2020-12-17 LAB — NOVEL CORONAVIRUS, NAA: SARS-CoV-2, NAA: DETECTED — AB

## 2020-12-18 ENCOUNTER — Telehealth: Payer: Self-pay | Admitting: Infectious Diseases

## 2020-12-18 NOTE — Telephone Encounter (Signed)
Called to discuss with patient about COVID-19 symptoms and the use of one of the available treatments for those with mild to moderate Covid symptoms and at a high risk of hospitalization.  Pt appears to qualify for outpatient treatment due to co-morbid conditions and/or a member of an at-risk group in accordance with the FDA Emergency Use Authorization.    Symptom onset: undocumented  Vaccinated: undocumented  Booster?  Immunocompromised? no Qualifiers: DM, BMI 37  Unable to reach pt - lvm and sent mychart messate   [employer]
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
## 2021-01-08 ENCOUNTER — Ambulatory Visit (HOSPITAL_COMMUNITY): Admission: RE | Admit: 2021-01-08 | Payer: Medicaid Other | Source: Ambulatory Visit

## 2021-01-08 ENCOUNTER — Encounter: Payer: Medicaid Other | Admitting: Vascular Surgery

## 2021-01-09 ENCOUNTER — Ambulatory Visit: Payer: Medicaid Other | Admitting: Physician Assistant

## 2021-01-09 DIAGNOSIS — I5022 Chronic systolic (congestive) heart failure: Secondary | ICD-10-CM

## 2021-01-09 DIAGNOSIS — I2510 Atherosclerotic heart disease of native coronary artery without angina pectoris: Secondary | ICD-10-CM

## 2021-01-09 DIAGNOSIS — E785 Hyperlipidemia, unspecified: Secondary | ICD-10-CM

## 2021-01-09 DIAGNOSIS — E1159 Type 2 diabetes mellitus with other circulatory complications: Secondary | ICD-10-CM

## 2021-01-09 DIAGNOSIS — G894 Chronic pain syndrome: Secondary | ICD-10-CM

## 2021-01-09 DIAGNOSIS — I5181 Takotsubo syndrome: Secondary | ICD-10-CM

## 2021-01-11 ENCOUNTER — Ambulatory Visit (HOSPITAL_COMMUNITY)
Admission: RE | Admit: 2021-01-11 | Discharge: 2021-01-11 | Disposition: A | Discharge status: Home / Self Care | Payer: Medicaid Other | Source: Ambulatory Visit | Attending: Student | Admitting: Student

## 2021-01-11 ENCOUNTER — Other Ambulatory Visit: Payer: Self-pay

## 2021-01-11 DIAGNOSIS — I5042 Chronic combined systolic (congestive) and diastolic (congestive) heart failure: Secondary | ICD-10-CM | POA: Insufficient documentation

## 2021-01-11 LAB — ECHOCARDIOGRAM COMPLETE
AR max vel: 1.96 cm2
AV Area VTI: 1.87 cm2
AV Area mean vel: 2.08 cm2
AV Mean grad: 5.6 mmHg
AV Peak grad: 10.1 mmHg
Ao pk vel: 1.59 m/s
Area-P 1/2: 4.57 cm2
S' Lateral: 2.4 cm

## 2021-01-11 NOTE — Progress Notes (Signed)
*  PRELIMINARY RESULTS* Echocardiogram 2D Echocardiogram has been performed.  Yolanda [name] 01/11/2021, 9:23 AM

## 2021-01-12 ENCOUNTER — Telehealth: Payer: Self-pay

## 2021-01-12 NOTE — Telephone Encounter (Signed)
Patient was contacted and she verbalized understanding of result note. She said she has no questions or concerns at this time and will keep follow up appointment on 2/14.

## 2021-01-12 NOTE — Telephone Encounter (Signed)
-----   Message from Erma Heritage, [place] sent at 01/12/2021  4:34 PM EST ----- Doristine Devoid news! Please let the patient know the pumping function of her heart has normalized! Her EF was previously at 20-25% by echo in 09/2020, now normalized to 55-60%. Mild thickness of the heart muscle but no significant wall motion abnormalities. No significant valve abnormalities. Keep scheduled follow-up on 2/14 and will be reviewed in detail at that time.
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
## 2021-01-15 ENCOUNTER — Ambulatory Visit: Payer: Medicaid Other | Admitting: Family Medicine

## 2021-01-15 ENCOUNTER — Ambulatory Visit: Payer: Medicaid Other | Admitting: Physician Assistant

## 2021-01-15 DIAGNOSIS — I10 Essential (primary) hypertension: Secondary | ICD-10-CM

## 2021-01-15 DIAGNOSIS — R000 Tachycardia, unspecified: Secondary | ICD-10-CM

## 2021-01-15 DIAGNOSIS — E782 Mixed hyperlipidemia: Secondary | ICD-10-CM

## 2021-01-15 DIAGNOSIS — R0600 Dyspnea, unspecified: Secondary | ICD-10-CM

## 2021-01-15 DIAGNOSIS — I5181 Takotsubo syndrome: Secondary | ICD-10-CM

## 2021-01-31 ENCOUNTER — Other Ambulatory Visit: Payer: Medicaid Other | Admitting: Advanced Practice Midwife

## 2021-02-09 ENCOUNTER — Other Ambulatory Visit: Payer: Self-pay | Admitting: *Deleted

## 2021-02-09 DIAGNOSIS — I739 Peripheral vascular disease, unspecified: Secondary | ICD-10-CM

## 2021-02-12 ENCOUNTER — Ambulatory Visit (INDEPENDENT_AMBULATORY_CARE_PROVIDER_SITE_OTHER): Payer: Medicaid Other | Admitting: Vascular Surgery

## 2021-02-12 ENCOUNTER — Ambulatory Visit (INDEPENDENT_AMBULATORY_CARE_PROVIDER_SITE_OTHER): Payer: Medicaid Other

## 2021-02-12 ENCOUNTER — Encounter: Payer: Self-pay | Admitting: Vascular Surgery

## 2021-02-12 ENCOUNTER — Other Ambulatory Visit: Payer: Self-pay

## 2021-02-12 VITALS — BP 111/78 | HR 96 | Temp 98.3°F | Resp 18 | Ht 67.0 in | Wt 238.0 lb

## 2021-02-12 DIAGNOSIS — I739 Peripheral vascular disease, unspecified: Secondary | ICD-10-CM | POA: Diagnosis not present
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
## 2021-02-15 ENCOUNTER — Ambulatory Visit: Payer: Medicaid Other | Admitting: Student
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
## 2021-02-19 ENCOUNTER — Encounter: Payer: Self-pay | Admitting: Internal Medicine

## 2021-02-19 ENCOUNTER — Other Ambulatory Visit: Payer: Self-pay

## 2021-02-19 ENCOUNTER — Ambulatory Visit: Payer: Medicaid Other | Admitting: Internal Medicine

## 2021-02-19 VITALS — BP 120/72 | HR 116 | Resp 16 | Ht 67.0 in | Wt 230.0 lb

## 2021-02-19 DIAGNOSIS — M2540 Effusion, unspecified joint: Secondary | ICD-10-CM

## 2021-02-19 DIAGNOSIS — M5441 Lumbago with sciatica, right side: Secondary | ICD-10-CM | POA: Diagnosis not present

## 2021-02-19 DIAGNOSIS — G8929 Other chronic pain: Secondary | ICD-10-CM

## 2021-02-19 DIAGNOSIS — G5601 Carpal tunnel syndrome, right upper limb: Secondary | ICD-10-CM | POA: Diagnosis not present

## 2021-02-19 DIAGNOSIS — E669 Obesity, unspecified: Secondary | ICD-10-CM | POA: Insufficient documentation

## 2021-02-19 DIAGNOSIS — M720 Palmar fascial fibromatosis [Dupuytren]: Secondary | ICD-10-CM | POA: Diagnosis not present

## 2021-02-19 DIAGNOSIS — E559 Vitamin D deficiency, unspecified: Secondary | ICD-10-CM | POA: Insufficient documentation

## 2021-02-19 DIAGNOSIS — L409 Psoriasis, unspecified: Secondary | ICD-10-CM | POA: Insufficient documentation

## 2021-02-19 NOTE — Patient Instructions (Addendum)
You may have some joint inflammation related to the psoriasis but I do not see any clear evidence of this on exam today. This could be because the [facility] medication can also reduce this type of inflammation. We can follow up again if you notice increases in swelling or of the nodules or contractures.  I am checking sedimentation rate which is a marker of inflammation which may help for monitoring future changes.  I am checking your vitamin D which has been low in the past and can worsen psoriatic arthritis and risk for bone fractures  Your right hand has early dupuytren's contracture   Dupuytren's Contracture Dupuytren's contracture is a condition in which tissue under the skin of the palm becomes thick. This causes one or more of the fingers to curl inward (contract) toward the palm. After a while, the fingers may not be able to straighten out. This condition affects some or all of the fingers and the palm of the hand. This condition may affect one or both hands. Dupuytren's contracture is a long-term (chronic) condition that develops (progresses) slowly over time. There is no cure, but symptoms can be managed and progression can be slowed with treatment. This condition is usually not dangerous or painful, but it can interfere with everyday tasks. What are the causes? This condition is caused by tissue (fascia) in the palm that gets thicker and tighter. When the fascia thickens, it pulls on the cords of tissue (tendons) that control finger movement. This causes the fingers to contract. The cause of fascia thickening is not known. However, the condition is often passed along from parent to child (inherited).   What increases the risk? The following factors may make you more likely to develop this condition:  Being 26 years of age or older.  Being female.  Having a family history of this condition.  Using tobacco products, including cigarettes, chewing tobacco, and e-cigarettes.  Drinking  alcohol excessively.  Having diabetes.  Having a seizure disorder. What are the signs or symptoms? Early symptoms of this condition may include:  Thick, puckered skin on the hand.  One or more lumps (nodules) on the palm. Nodules may be tender when they first appear, but they are generally painless. Later symptoms of this condition may include:  Thick cords of tissue in the palm.  Fingers curled up toward the palm.  Inability to straighten the fingers into their normal position. Though this condition is usually painless, you may have discomfort when holding or grabbing objects.   How is this diagnosed? This condition is diagnosed with a physical exam, which may include:  Looking at your hands and feeling your palms. This is to check for thickened fascia and nodules.  Measuring finger motion.  Doing the [place] tabletop test. You may be asked to try to put your hand on a surface, with your palm down and your fingers straight out. How is this treated? There is no cure for this condition, but treatment can relieve discomfort and make symptoms more manageable. Treatment options may include:  Physical therapy. This can strengthen your hand and increase flexibility.  Occupational therapy. This can help you with everyday tasks that may be more difficult because of your condition.  Shots (injections). Substances may be injected into your hand, such as: ? Medicines that help to decrease swelling (corticosteroids). ? Proteins (collagenase) to weaken thick tissue. After a collagenase injection, your health care provider may stretch your fingers.  Needle aponeurotomy. A needle is pushed through the skin  and into the fascia. Moving the needle against the fascia can weaken or break up the thick tissue.  Surgery. This may be needed if your condition causes discomfort or interferes with everyday activities. Physical therapy is usually needed after surgery. No treatment is guaranteed to cure  this condition. Recurrence of symptoms is common. Follow these instructions at home: Hand care  Take these actions to help protect your hand from possible injury: ? Use tools that have padded grips. ? Wear protective gloves while you work with your hands. ? Avoid repetitive hand movements. General instructions  Take over-the-counter and prescription medicines only as told by your health care provider.  Manage any other conditions that you have, such as diabetes.  If physical therapy was prescribed, do exercises as told by your health care provider.  Do not use any products that contain nicotine or tobacco, such as cigarettes, e-cigarettes, and chewing tobacco. If you need help quitting, ask your health care provider.  If you drink alcohol: ? Limit how much you use to:  0-1 drink a day for women.  0-2 drinks a day for men. ? Be aware of how much alcohol is in your drink. In the [place] one drink equals one 12 oz bottle of beer (355 mL), one 5 oz glass of wine (148 mL), or one 1 oz glass of hard liquor (44 mL).  Keep all follow-up visits as told by your health care provider. This is important. Contact a health care provider if:  You develop new symptoms, or your symptoms get worse.  You have pain that gets worse or does not get better with medicine.  You have difficulty or discomfort with everyday tasks.  You develop numbness or tingling. Get help right away if:  You have severe pain.  Your fingers change color or become unusually cold. Summary  Dupuytren's contracture is a condition in which tissue under the skin of the palm becomes thick.  This condition is caused by tissue (fascia) that thickens. When it thickens, it pulls on the cords of tissue (tendons) that control finger movement and makes the fingers to contract.  You are more likely to develop this condition if you are a man, are over 57 years of age, have a family history of the condition, and drink a lot of  alcohol.  This condition can be treated with physical and occupational therapy, injections, and surgery.  Follow instructions about how to care for your hand. Get help right away if you have severe pain or your fingers change color or become cold.

## 2021-02-20 LAB — VITAMIN D 25 HYDROXY (VIT D DEFICIENCY, FRACTURES): Vit D, 25-Hydroxy: 16 ng/mL — ABNORMAL LOW (ref 30–100)

## 2021-02-20 LAB — SEDIMENTATION RATE: Sed Rate: 9 mm/h (ref 0–20)

## 2021-02-21 ENCOUNTER — Other Ambulatory Visit: Payer: Self-pay | Admitting: "Endocrinology

## 2021-02-27 ENCOUNTER — Encounter: Payer: Medicaid Other | Attending: "Endocrinology | Admitting: Nutrition

## 2021-02-27 DIAGNOSIS — E1159 Type 2 diabetes mellitus with other circulatory complications: Secondary | ICD-10-CM | POA: Insufficient documentation

## 2021-02-27 DIAGNOSIS — G459 Transient cerebral ischemic attack, unspecified: Secondary | ICD-10-CM | POA: Insufficient documentation

## 2021-02-27 DIAGNOSIS — I252 Old myocardial infarction: Secondary | ICD-10-CM | POA: Insufficient documentation

## 2021-02-27 DIAGNOSIS — E782 Mixed hyperlipidemia: Secondary | ICD-10-CM | POA: Insufficient documentation

## 2021-02-27 DIAGNOSIS — I10 Essential (primary) hypertension: Secondary | ICD-10-CM | POA: Insufficient documentation

## 2021-02-27 NOTE — Progress Notes (Signed)
Test for inflammation is normal suggesting her skyrizi treatment is doing an adequate job of controlling the autoimmune disease and do not recommend additional medicine at this time. The vitamin D level is deficient I recommend her to start taking 2000 units daily or if she is already on a supplement then to increase this.

## 2021-02-28 ENCOUNTER — Telehealth: Payer: Self-pay

## 2021-02-28 NOTE — Telephone Encounter (Signed)
Patient left a voicemail stating she was returning your call regarding her labwork results.

## 2021-03-09 ENCOUNTER — Ambulatory Visit: Payer: Medicaid Other | Admitting: Student

## 2021-03-13 ENCOUNTER — Other Ambulatory Visit: Payer: Self-pay

## 2021-03-13 ENCOUNTER — Encounter: Payer: Self-pay | Admitting: "Endocrinology

## 2021-03-13 ENCOUNTER — Ambulatory Visit (INDEPENDENT_AMBULATORY_CARE_PROVIDER_SITE_OTHER): Payer: Medicaid Other | Admitting: "Endocrinology

## 2021-03-13 VITALS — BP 114/78 | HR 100 | Ht 67.0 in | Wt 230.0 lb

## 2021-03-13 DIAGNOSIS — E559 Vitamin D deficiency, unspecified: Secondary | ICD-10-CM | POA: Diagnosis not present

## 2021-03-13 DIAGNOSIS — I10 Essential (primary) hypertension: Secondary | ICD-10-CM | POA: Diagnosis not present

## 2021-03-13 DIAGNOSIS — E782 Mixed hyperlipidemia: Secondary | ICD-10-CM

## 2021-03-13 DIAGNOSIS — E1159 Type 2 diabetes mellitus with other circulatory complications: Secondary | ICD-10-CM | POA: Diagnosis not present

## 2021-03-13 LAB — POCT GLYCOSYLATED HEMOGLOBIN (HGB A1C): HbA1c, POC (controlled diabetic range): 10.7 % — AB (ref 0.0–7.0)

## 2021-03-13 MED ORDER — DEXCOM G6 SENSOR MISC: 4.0000 | 2 refills | Status: DC

## 2021-03-13 MED ORDER — VITAMIN D (ERGOCALCIFEROL) 1.25 MG (50000 UNIT) PO CAPS: 50000.0000 [IU] | ORAL_CAPSULE | ORAL | 0 refills | Status: DC

## 2021-03-13 MED ORDER — DEXCOM G6 RECEIVER DEVI: 1.0000 | 0 refills | Status: DC | PRN

## 2021-03-13 MED ORDER — DEXCOM G6 TRANSMITTER MISC: 1.0000 | 1 refills | Status: DC
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
## 2021-03-13 NOTE — Patient Instructions (Signed)
                                     Advice for Weight Management  -For most of us the best way to lose weight is by diet management. Generally speaking, diet management means consuming less calories intentionally which over time brings about progressive weight loss.  This can be achieved more effectively by restricting carbohydrate consumption to the minimum possible.  So, it is critically important to know your numbers: how much calorie you are consuming and how much calorie you need. More importantly, our carbohydrates sources should be unprocessed or minimally processed complex starch food items.   Sometimes, it is important to balance nutrition by increasing protein intake (animal or plant source), fruits, and vegetables.  -Sticking to a routine mealtime to eat 3 meals a day and avoiding unnecessary snacks is shown to have a big role in weight control. Under normal circumstances, the only time we lose real weight is when we are hungry, so allow hunger to take place- hunger means no food between meal times, only water.  It is not advisable to starve.   -It is better to avoid simple carbohydrates including: Cakes, Sweet Desserts, Ice Cream, Soda (diet and regular), Sweet Tea, Candies, Chips, Cookies, Store Bought Juices, Alcohol in Excess of  1-2 drinks a day, Lemonade,  Artificial Sweeteners, Doughnuts, Coffee Creamers, "Sugar-free" Products, etc, etc.  This is not a complete list.....    -Consulting with certified diabetes educators is proven to provide you with the most accurate and current information on diet.  Also, you may be  interested in discussing diet options/exchanges , we can schedule a visit with Yolanda [name], RDN, CDE for individualized nutrition education.  -Exercise: If you are able: 30 -60 minutes a day ,4 days a week, or 150 minutes a week.  The longer the better.  Combine stretch, strength, and aerobic activities.  If you were told in the  past that you have high risk for cardiovascular diseases, you may seek evaluation by your heart doctor prior to initiating moderate to intense exercise programs.                                  Additional Care Considerations for Diabetes   -Diabetes  is a chronic disease.  The most important care consideration is regular follow-up with your diabetes care provider with the goal being avoiding or delaying its complications and to take advantage of advances in medications and technology.    -Type 2 diabetes is known to coexist with other important comorbidities such as high blood pressure and high cholesterol.  It is critical to control not only the diabetes but also the high blood pressure and high cholesterol to minimize and delay the risk of complications including coronary artery disease, stroke, amputations, blindness, etc.    - Studies showed that people with diabetes will benefit from a class of medications known as ACE inhibitors and statins.  Unless there are specific reasons not to be on these medications, the standard of care is to consider getting one from these groups of medications at an optimal doses.  These medications are generally considered safe and proven to help protect the heart and the kidneys.    - People with diabetes are encouraged to initiate and maintain regular follow-up with eye doctors, foot   doctors, dentists , and if necessary heart and kidney doctors.     - It is highly recommended that people with diabetes quit smoking or stay away from smoking, and get yearly  flu vaccine and pneumonia vaccine at least every 5 years.  One other important lifestyle recommendation is to ensure adequate sleep - at least 6-7 hours of uninterrupted sleep at night.  -Exercise: If you are able: 30 -60 minutes a day, 4 days a week, or 150 minutes a week.  The longer the better.  Combine stretch, strength, and aerobic activities.  If you were told in the past that you have high risk for  cardiovascular diseases, you may seek evaluation by your heart doctor prior to initiating moderate to intense exercise programs.          

## 2021-03-15 ENCOUNTER — Other Ambulatory Visit: Payer: Self-pay | Admitting: "Endocrinology

## 2021-03-17 NOTE — Progress Notes (Deleted)
   WELL-WOMAN EXAMINATION Patient name: Yolanda [name] MRN [id number]  Date of birth: 1970/12/09 Chief Complaint:   No chief complaint on file.  History of Present Illness:   Yolanda [name] is a 50 y.o. [phone] Caucasian female being seen today for a routine well-woman exam and follow up regarding: ***  -Urge incontinence: previously Rx for Toviaz. Today she notes: ***   No LMP recorded. (Menstrual status: Irregular Periods). Denies issues with her menses The current method of family planning is tubal ligation.   Last pap 01/2020- Pap neg, HPV pos, 16/18 neg, []  repeat cotest today.  Last mammogram: 06/2020. Last colonoscopy: []  needs referral  Depression screen [facility] 2/9 09/28/2020 02/15/2020 06/11/2016 04/16/2016 03/06/2016  Decreased Interest 0 0 0 0 0  Down, Depressed, Hopeless 0 0 0 0 0  PHQ - 2 Score 0 0 0 0 0      Review of Systems:   Pertinent items are noted in HPI Denies any headaches, blurred vision, fatigue, shortness of breath, chest pain, abdominal pain, bowel movements, urination, or intercourse unless otherwise stated above.  Pertinent History Reviewed:  Reviewed past medical,surgical, social and family history.  Reviewed problem list, medications and allergies. Physical Assessment:  There were no vitals filed for this visit.There is no height or weight on file to calculate BMI.        Physical Examination:   General appearance - well appearing, and in no distress  Mental status - alert, oriented to person, place, and time  Psych:  She has a normal mood and affect  Skin - warm and dry, normal color, no suspicious lesions noted  Chest - effort normal, all lung fields clear to auscultation bilaterally  Heart - normal rate and regular rhythm  Neck:  midline trachea, no thyromegaly or nodules  Breasts - breasts appear normal, no suspicious masses, no skin or nipple changes or  axillary nodes  Abdomen - soft, nontender, nondistended, no masses or organomegaly  Pelvic  - VULVA: normal appearing vulva with no masses, tenderness or lesions  VAGINA: normal appearing vagina with normal color and discharge, no lesions  CERVIX: normal appearing cervix without discharge or lesions, no CMT  Thin prep pap is {Desc; done/not:10129} *** HR HPV cotesting  UTERUS: uterus is felt to be normal size, shape, consistency and nontender   ADNEXA: No adnexal masses or tenderness noted.  Rectal - normal rectal, good sphincter tone, no masses felt. Hemoccult: ***  Extremities:  No swelling or varicosities noted  Chaperone: {Chaperone:19197::"N/A","Latisha Cresenzo","Janet Young","Amanda Andrews","Peggy Dones","Nicole Jones","Angel Neas"}     Assessment & Plan:  1) Well-Woman Exam ***  2) ***  No orders of the defined types were placed in this encounter.   Meds: No orders of the defined types were placed in this encounter.   Follow-up: No follow-ups on file.   [name], DO Attending [place], [facility] for [employer], [place]

## 2021-03-21 ENCOUNTER — Other Ambulatory Visit: Payer: Medicaid Other | Admitting: Obstetrics & Gynecology
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
## 2021-04-02 ENCOUNTER — Ambulatory Visit (INDEPENDENT_AMBULATORY_CARE_PROVIDER_SITE_OTHER): Payer: Medicaid Other | Admitting: Physician Assistant

## 2021-04-02 ENCOUNTER — Other Ambulatory Visit: Payer: Self-pay

## 2021-04-02 ENCOUNTER — Other Ambulatory Visit: Payer: Medicaid Other | Admitting: Adult Health

## 2021-04-02 ENCOUNTER — Encounter: Payer: Self-pay | Admitting: Physician Assistant

## 2021-04-02 VITALS — BP 122/64 | HR 106 | Ht 67.0 in | Wt 236.8 lb

## 2021-04-02 DIAGNOSIS — E785 Hyperlipidemia, unspecified: Secondary | ICD-10-CM | POA: Diagnosis not present

## 2021-04-02 DIAGNOSIS — G894 Chronic pain syndrome: Secondary | ICD-10-CM

## 2021-04-02 DIAGNOSIS — I2510 Atherosclerotic heart disease of native coronary artery without angina pectoris: Secondary | ICD-10-CM | POA: Diagnosis not present

## 2021-04-02 DIAGNOSIS — I5181 Takotsubo syndrome: Secondary | ICD-10-CM | POA: Diagnosis not present

## 2021-04-02 DIAGNOSIS — I5022 Chronic systolic (congestive) heart failure: Secondary | ICD-10-CM | POA: Diagnosis not present

## 2021-04-02 DIAGNOSIS — E1159 Type 2 diabetes mellitus with other circulatory complications: Secondary | ICD-10-CM

## 2021-04-02 MED ORDER — METOPROLOL SUCCINATE ER 50 MG PO TB24: 75.0000 mg | ORAL_TABLET | Freq: Every day | ORAL | 3 refills | Status: AC

## 2021-04-02 NOTE — Patient Instructions (Signed)
Medication Instructions:  Your physician has recommended you make the following change in your medication:   Increase Toprol XL to 75 mg Daily   *If you need a refill on your cardiac medications before your next appointment, please call your pharmacy*   Lab Work: NONE   If you have labs (blood work) drawn today and your tests are completely normal, you will receive your results only by: [name] MyChart Message (if you have MyChart) OR . A paper copy in the mail If you have any lab test that is abnormal or we need to change your treatment, we will call you to review the results.   Testing/Procedures: NONE    Follow-Up: At [facility], you and your health needs are our priority.  As part of our continuing mission to provide you with exceptional heart care, we have created designated Provider Care Teams.  These Care Teams include your primary Cardiologist (physician) and Advanced Practice Providers (APPs -  Physician Assistants and Nurse Practitioners) who all work together to provide you with the care you need, when you need it.  We recommend signing up for the patient portal called "MyChart".  Sign up information is provided on this After Visit Summary.  MyChart is used to connect with patients for Virtual Visits (Telemedicine).  Patients are able to view lab/test results, encounter notes, upcoming appointments, etc.  Non-urgent messages can be sent to your provider as well.   To learn more about what you can do with MyChart, go to [email or web address].    Your next appointment:    Next available   The format for your next appointment:   In Person  Provider:   Carlyle Dolly, MD   Other Instructions Thank you for choosing [facility]!

## 2021-04-13 ENCOUNTER — Telehealth: Payer: Self-pay | Admitting: *Deleted

## 2021-04-13 NOTE — Telephone Encounter (Signed)
Myrbetriq 50 mg has been approved. PA # [id number]. Good 04/13/21-04/08/22. Pt and [place] aware. [place]

## 2021-04-16 ENCOUNTER — Other Ambulatory Visit (HOSPITAL_COMMUNITY)
Admission: RE | Admit: 2021-04-16 | Discharge: 2021-04-16 | Disposition: A | Discharge status: Home / Self Care | Payer: Medicaid Other | Source: Ambulatory Visit | Attending: Obstetrics & Gynecology | Admitting: Obstetrics & Gynecology

## 2021-04-16 ENCOUNTER — Other Ambulatory Visit (INDEPENDENT_AMBULATORY_CARE_PROVIDER_SITE_OTHER): Payer: Medicaid Other | Admitting: *Deleted

## 2021-04-16 ENCOUNTER — Other Ambulatory Visit: Payer: Self-pay

## 2021-04-16 DIAGNOSIS — N898 Other specified noninflammatory disorders of vagina: Secondary | ICD-10-CM

## 2021-04-16 NOTE — Progress Notes (Signed)
Chart reviewed for nurse visit. Agree with plan of care.  Estill Dooms, NP 04/16/2021 12:13 PM

## 2021-04-16 NOTE — Progress Notes (Signed)
   NURSE VISIT- VAGINITIS/STD  SUBJECTIVE:  Yolanda [name] is a 50 y.o. [phone] GYN patientfemale here for a vaginal swab for vaginitis screening, STD screen.  She reports the following symptoms: vaginal itching and vaginal discharge with odor for 6 months. Denies abnormal vaginal bleeding, significant pelvic pain, fever, or UTI symptoms.  OBJECTIVE:  There were no vitals taken for this visit.  Appears well, in no apparent distress  ASSESSMENT: Vaginal swab for vaginitis screening & STD screening  PLAN: Self-collected vaginal probe for Gonorrhea, Chlamydia, Trichomonas, Bacterial Vaginosis, Yeast sent to lab Treatment: to be determined once results are received Follow-up as needed if symptoms persist/worsen, or new symptoms develop  Levy Pupa  04/16/2021 11:19 AM

## 2021-04-17 ENCOUNTER — Other Ambulatory Visit: Payer: Self-pay | Admitting: Adult Health

## 2021-04-17 LAB — CERVICOVAGINAL ANCILLARY ONLY
Bacterial Vaginitis (gardnerella): POSITIVE — AB
Candida Glabrata: NEGATIVE
Candida Vaginitis: NEGATIVE
Chlamydia: NEGATIVE
Comment: NEGATIVE
Comment: NEGATIVE
Comment: NEGATIVE
Comment: NEGATIVE
Comment: NEGATIVE
Comment: NORMAL
Neisseria Gonorrhea: NEGATIVE
Trichomonas: NEGATIVE

## 2021-04-17 MED ORDER — METRONIDAZOLE 500 MG PO TABS: 500.0000 mg | ORAL_TABLET | Freq: Two times a day (BID) | ORAL | 0 refills | Status: DC

## 2021-04-17 NOTE — Progress Notes (Signed)
+  BV on vaginal swab will rx flagyl

## 2021-05-07 ENCOUNTER — Ambulatory Visit (HOSPITAL_COMMUNITY): Payer: Medicaid Other

## 2021-05-14 ENCOUNTER — Other Ambulatory Visit (HOSPITAL_COMMUNITY)
Admission: RE | Admit: 2021-05-14 | Discharge: 2021-05-14 | Disposition: A | Discharge status: Home / Self Care | Payer: Medicaid Other | Source: Ambulatory Visit | Attending: Adult Health | Admitting: Adult Health

## 2021-05-14 ENCOUNTER — Ambulatory Visit (HOSPITAL_COMMUNITY): Payer: Medicaid Other | Attending: Neurology | Admitting: Physical Therapy

## 2021-05-14 ENCOUNTER — Ambulatory Visit (INDEPENDENT_AMBULATORY_CARE_PROVIDER_SITE_OTHER): Payer: Medicaid Other | Admitting: Adult Health

## 2021-05-14 ENCOUNTER — Other Ambulatory Visit: Payer: Self-pay

## 2021-05-14 ENCOUNTER — Encounter: Payer: Self-pay | Admitting: Adult Health

## 2021-05-14 ENCOUNTER — Encounter (HOSPITAL_COMMUNITY): Payer: Self-pay | Admitting: Physical Therapy

## 2021-05-14 VITALS — BP 110/74 | HR 103 | Ht 67.0 in | Wt 238.0 lb

## 2021-05-14 DIAGNOSIS — M6281 Muscle weakness (generalized): Secondary | ICD-10-CM

## 2021-05-14 DIAGNOSIS — Z01419 Encounter for gynecological examination (general) (routine) without abnormal findings: Secondary | ICD-10-CM | POA: Insufficient documentation

## 2021-05-14 DIAGNOSIS — R2689 Other abnormalities of gait and mobility: Secondary | ICD-10-CM | POA: Diagnosis present

## 2021-05-14 DIAGNOSIS — R87810 Cervical high risk human papillomavirus (HPV) DNA test positive: Secondary | ICD-10-CM

## 2021-05-14 DIAGNOSIS — N898 Other specified noninflammatory disorders of vagina: Secondary | ICD-10-CM | POA: Diagnosis not present

## 2021-05-14 DIAGNOSIS — M79604 Pain in right leg: Secondary | ICD-10-CM

## 2021-05-14 DIAGNOSIS — R29898 Other symptoms and signs involving the musculoskeletal system: Secondary | ICD-10-CM | POA: Diagnosis present

## 2021-05-14 DIAGNOSIS — Z1231 Encounter for screening mammogram for malignant neoplasm of breast: Secondary | ICD-10-CM | POA: Diagnosis not present

## 2021-05-14 DIAGNOSIS — M545 Low back pain, unspecified: Secondary | ICD-10-CM | POA: Insufficient documentation

## 2021-05-14 DIAGNOSIS — N926 Irregular menstruation, unspecified: Secondary | ICD-10-CM

## 2021-05-14 DIAGNOSIS — Z1211 Encounter for screening for malignant neoplasm of colon: Secondary | ICD-10-CM | POA: Diagnosis not present

## 2021-05-14 DIAGNOSIS — N951 Menopausal and female climacteric states: Secondary | ICD-10-CM

## 2021-05-14 LAB — HEMOCCULT GUIAC POC 1CARD (OFFICE): Fecal Occult Blood, POC: NEGATIVE

## 2021-05-14 NOTE — Progress Notes (Signed)
Patient ID: Yolanda [name], female   DOB: 1971/05/28, 50 y.o.   MRN: [id number] History of Present Illness: Yolanda [name] is a 50 year old white female, married, G2P0111, in for a well woman gyn exam and pap. PCP is Dr Cindie Laroche.   Current Medications, Allergies, Past Medical History, Past Surgical History, Family History and Social History were reviewed in [employer] record.     Review of Systems: Patient denies any headaches, hearing loss, fatigue, blurred vision, shortness of breath, chest pain, abdominal pain, problems with bowel movements, urination, or intercourse(not active, he is in jail). No joint pain or mood swings.  Has had sticky vaginal discharge for about 6 months with occasional odor, and was treated for BV Periods not regular Always hot   Physical Exam:BP 110/74 (BP Location: Left Arm, Patient Position: Sitting, Cuff Size: Normal)   Pulse (!) 103   Ht 5' 7"  (1.702 m)   Wt 238 lb (108 kg)   LMP 04/10/2021 (Approximate)   BMI 37.28 kg/m   General:  Well developed, well nourished, no acute distress Skin:  Warm and dry Neck:  Midline trachea, normal thyroid, good ROM, no lymphadenopathy Lungs; Clear to auscultation bilaterally Breast:  No dominant palpable mass, retraction, or nipple discharge Cardiovascular: Regular rate and rhythm Abdomen:  Soft, non tender, no hepatosplenomegaly Pelvic:  External genitalia is normal in appearance, no lesions.  The vagina is normal in appearance.Has creamy discharge, without odor. Urethra has no lesions or masses. The cervix is bulbous.Pap with HR HPV genotyping and GC/CHL performed  Uterus is felt to be normal size, shape, and contour.  No adnexal masses or tenderness noted.Bladder is non tender, no masses felt. Rectal: Good sphincter tone, no polyps, or hemorrhoids felt.  Hemoccult negative. Extremities/musculoskeletal:  No swelling or varicosities noted, no clubbing or cyanosis Psych:  No mood changes, alert and  cooperative,seems happy AA is 0 Fall risk is moderate, going to PT for right leg Depression screen [facility] 2/9 05/14/2021 09/28/2020 02/15/2020  Decreased Interest 3 0 0  Down, Depressed, Hopeless 2 0 0  PHQ - 2 Score 5 0 0  Altered sleeping 3 - -  Tired, decreased energy 1 - -  Change in appetite 1 - -  Feeling bad or failure about yourself  2 - -  Trouble concentrating 2 - -  Moving slowly or fidgety/restless 0 - -  Suicidal thoughts 0 - -  PHQ-9 Score 14 - -  Some recent data might be hidden    GAD 7 : Generalized Anxiety Score 05/14/2021  Nervous, Anxious, on Edge 2  Control/stop worrying 1  Worry too much - different things 1  Trouble relaxing 2  Restless 0  Easily annoyed or irritable 2  Afraid - awful might happen 0  Total GAD 7 Score 8    She is on meds.  Upstream - 05/14/21 1052       Pregnancy Intention Screening   Does the patient want to become pregnant in the next year? No    Does the patient's partner want to become pregnant in the next year? No    Would the patient like to discuss contraceptive options today? No      Contraception Wrap Up   Current Method Female Sterilization    End Method Female Sterilization    Contraception Counseling Provided No            Examination chaperoned by Tish RN   Impression and plan: 1. Encounter for routine gynecological  examination with Papanicolaou smear of cervix Pap sent Physical in 1 year Pap in 3 if normal   2. Encounter for screening fecal occult blood testing  3. Vaginal discharge   4. Screening mammogram for breast cancer Pt to call for mammogram in July  5. Papanicolaou smear of cervix with positive high risk human papilloma virus (HPV) test Pap sent  6. Irregular periods   7. Perimenopause
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
## 2021-05-15 ENCOUNTER — Encounter: Payer: Self-pay | Admitting: Cardiology

## 2021-05-15 ENCOUNTER — Ambulatory Visit (INDEPENDENT_AMBULATORY_CARE_PROVIDER_SITE_OTHER): Payer: Medicaid Other | Admitting: Cardiology

## 2021-05-15 VITALS — BP 122/74 | HR 96 | Ht 67.0 in | Wt 238.6 lb

## 2021-05-15 DIAGNOSIS — E782 Mixed hyperlipidemia: Secondary | ICD-10-CM | POA: Diagnosis not present

## 2021-05-15 DIAGNOSIS — I2510 Atherosclerotic heart disease of native coronary artery without angina pectoris: Secondary | ICD-10-CM | POA: Diagnosis not present

## 2021-05-15 DIAGNOSIS — I5181 Takotsubo syndrome: Secondary | ICD-10-CM
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
## 2021-05-15 NOTE — Patient Instructions (Signed)
Medication Instructions:  Your physician recommends that you continue on your current medications as directed. Please refer to the Current Medication list given to you today.  *If you need a refill on your cardiac medications before your next appointment, please call your pharmacy*   Lab Work: None today If you have labs (blood work) drawn today and your tests are completely normal, you will receive your results only by: [place] (if you have MyChart) OR A paper copy in the mail If you have any lab test that is abnormal or we need to change your treatment, we will call you to review the results.   Testing/Procedures: None today   Follow-Up: At [facility], you and your health needs are our priority.  As part of our continuing mission to provide you with exceptional heart care, we have created designated Provider Care Teams.  These Care Teams include your primary Cardiologist (physician) and Advanced Practice Providers (APPs -  Physician Assistants and Nurse Practitioners) who all work together to provide you with the care you need, when you need it.  We recommend signing up for the patient portal called "MyChart".  Sign up information is provided on this After Visit Summary.  MyChart is used to connect with patients for Virtual Visits (Telemedicine).  Patients are able to view lab/test results, encounter notes, upcoming appointments, etc.  Non-urgent messages can be sent to your provider as well.   To learn more about what you can do with MyChart, go to [email or web address].    Your next appointment:   6 month(s)  The format for your next appointment:   In Person  Provider:   Carlyle Dolly, MD   Other Instructions None

## 2021-05-16 ENCOUNTER — Other Ambulatory Visit: Payer: Self-pay

## 2021-05-16 ENCOUNTER — Ambulatory Visit (HOSPITAL_COMMUNITY): Payer: Medicaid Other | Admitting: Physical Therapy

## 2021-05-16 DIAGNOSIS — M79604 Pain in right leg: Secondary | ICD-10-CM | POA: Diagnosis not present

## 2021-05-16 DIAGNOSIS — M545 Low back pain, unspecified: Secondary | ICD-10-CM

## 2021-05-16 DIAGNOSIS — M6281 Muscle weakness (generalized): Secondary | ICD-10-CM

## 2021-05-16 DIAGNOSIS — R2689 Other abnormalities of gait and mobility: Secondary | ICD-10-CM

## 2021-05-16 DIAGNOSIS — R29898 Other symptoms and signs involving the musculoskeletal system: Secondary | ICD-10-CM

## 2021-05-16 LAB — CYTOLOGY - PAP
Chlamydia: NEGATIVE
Comment: NEGATIVE
Comment: NEGATIVE
Comment: NORMAL
Diagnosis: UNDETERMINED — AB
High risk HPV: NEGATIVE
Neisseria Gonorrhea: NEGATIVE
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
## 2021-05-17 ENCOUNTER — Encounter: Payer: Self-pay | Admitting: Adult Health

## 2021-05-17 ENCOUNTER — Other Ambulatory Visit: Payer: Self-pay | Admitting: Adult Health

## 2021-05-17 DIAGNOSIS — R87610 Atypical squamous cells of undetermined significance on cytologic smear of cervix (ASC-US): Secondary | ICD-10-CM | POA: Insufficient documentation

## 2021-05-17 HISTORY — DX: Atypical squamous cells of undetermined significance on cytologic smear of cervix (ASC-US): R87.610

## 2021-05-17 MED ORDER — METRONIDAZOLE 0.75 % VA GEL: 1.0000 | Freq: Every day | VAGINAL | 0 refills | Status: DC

## 2021-05-22 ENCOUNTER — Other Ambulatory Visit: Payer: Self-pay

## 2021-05-22 ENCOUNTER — Ambulatory Visit (HOSPITAL_COMMUNITY): Payer: Medicaid Other | Admitting: Physical Therapy

## 2021-05-22 DIAGNOSIS — M545 Low back pain, unspecified: Secondary | ICD-10-CM

## 2021-05-22 DIAGNOSIS — R29898 Other symptoms and signs involving the musculoskeletal system: Secondary | ICD-10-CM

## 2021-05-22 DIAGNOSIS — M6281 Muscle weakness (generalized): Secondary | ICD-10-CM

## 2021-05-22 DIAGNOSIS — M79604 Pain in right leg: Secondary | ICD-10-CM

## 2021-05-22 DIAGNOSIS — R2689 Other abnormalities of gait and mobility: Secondary | ICD-10-CM
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
## 2021-05-22 NOTE — Patient Instructions (Signed)
Hamstring Stretch (Sitting)    Sitting, extend one leg and place hands on same thigh for support. Keeping torso straight, lean forward, sliding hands down leg, until a stretch is felt in back of thigh. Hold _30___ seconds. Repeat with other leg.  Complete _3_ repetitions on each leg _2-3_ times a day.

## 2021-05-25 ENCOUNTER — Ambulatory Visit (HOSPITAL_COMMUNITY): Payer: Medicaid Other

## 2021-05-30 ENCOUNTER — Ambulatory Visit (HOSPITAL_COMMUNITY): Payer: Medicaid Other

## 2021-05-30 ENCOUNTER — Other Ambulatory Visit: Payer: Self-pay

## 2021-05-30 ENCOUNTER — Encounter (HOSPITAL_COMMUNITY): Payer: Self-pay

## 2021-05-30 DIAGNOSIS — M6281 Muscle weakness (generalized): Secondary | ICD-10-CM

## 2021-05-30 DIAGNOSIS — R29898 Other symptoms and signs involving the musculoskeletal system: Secondary | ICD-10-CM

## 2021-05-30 DIAGNOSIS — R2689 Other abnormalities of gait and mobility: Secondary | ICD-10-CM

## 2021-05-30 DIAGNOSIS — M545 Low back pain, unspecified: Secondary | ICD-10-CM

## 2021-05-30 DIAGNOSIS — M79604 Pain in right leg: Secondary | ICD-10-CM
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
## 2021-05-31 ENCOUNTER — Ambulatory Visit (HOSPITAL_COMMUNITY): Payer: Medicaid Other | Admitting: Physical Therapy

## 2021-06-05 ENCOUNTER — Ambulatory Visit (HOSPITAL_COMMUNITY): Payer: Medicaid Other | Attending: Neurology

## 2021-06-05 ENCOUNTER — Encounter (HOSPITAL_COMMUNITY): Payer: Self-pay

## 2021-06-05 ENCOUNTER — Other Ambulatory Visit: Payer: Self-pay

## 2021-06-05 DIAGNOSIS — M545 Low back pain, unspecified: Secondary | ICD-10-CM | POA: Diagnosis present

## 2021-06-05 DIAGNOSIS — M6281 Muscle weakness (generalized): Secondary | ICD-10-CM | POA: Diagnosis present

## 2021-06-05 DIAGNOSIS — R29898 Other symptoms and signs involving the musculoskeletal system: Secondary | ICD-10-CM

## 2021-06-05 DIAGNOSIS — R2689 Other abnormalities of gait and mobility: Secondary | ICD-10-CM | POA: Diagnosis present

## 2021-06-05 DIAGNOSIS — M79604 Pain in right leg: Secondary | ICD-10-CM
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
## 2021-06-07 ENCOUNTER — Ambulatory Visit (HOSPITAL_COMMUNITY): Payer: Medicaid Other

## 2021-06-07 ENCOUNTER — Encounter (HOSPITAL_COMMUNITY): Payer: Self-pay

## 2021-06-07 ENCOUNTER — Other Ambulatory Visit: Payer: Self-pay

## 2021-06-07 DIAGNOSIS — M6281 Muscle weakness (generalized): Secondary | ICD-10-CM

## 2021-06-07 DIAGNOSIS — M79604 Pain in right leg: Secondary | ICD-10-CM

## 2021-06-07 DIAGNOSIS — R2689 Other abnormalities of gait and mobility: Secondary | ICD-10-CM

## 2021-06-07 DIAGNOSIS — M545 Low back pain, unspecified: Secondary | ICD-10-CM

## 2021-06-07 DIAGNOSIS — R29898 Other symptoms and signs involving the musculoskeletal system: Secondary | ICD-10-CM
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
## 2021-06-12 ENCOUNTER — Encounter (HOSPITAL_COMMUNITY): Payer: Medicaid Other | Admitting: Physical Therapy

## 2021-06-12 ENCOUNTER — Telehealth (HOSPITAL_COMMUNITY): Payer: Self-pay | Admitting: Physical Therapy

## 2021-06-12 NOTE — Telephone Encounter (Signed)
Pt did not show for appointment. Called and left message regarding missed appointment and reminder for next appointment on Thursday.  Teena Irani, PTA/CLT [phone]

## 2021-06-14 ENCOUNTER — Ambulatory Visit (HOSPITAL_COMMUNITY): Payer: Medicaid Other | Admitting: Physical Therapy

## 2021-06-14 ENCOUNTER — Encounter (HOSPITAL_COMMUNITY): Payer: Self-pay

## 2021-06-14 ENCOUNTER — Telehealth (HOSPITAL_COMMUNITY): Payer: Self-pay | Admitting: Physical Therapy

## 2021-06-14 NOTE — Telephone Encounter (Signed)
Pt did not show (2nd NS).  Left detailed message regarding NS policy and she will need to schedule remaining appt 1 at a time.  Informed of next appt on Tuesday 7/19 at 9:15 and would be discharged per policy if she fails to show for this one.  Teena Irani, PTA/CLT [phone]

## 2021-06-18 ENCOUNTER — Other Ambulatory Visit: Payer: Self-pay

## 2021-06-18 ENCOUNTER — Ambulatory Visit (HOSPITAL_COMMUNITY)
Admission: RE | Admit: 2021-06-18 | Discharge: 2021-06-18 | Disposition: A | Discharge status: Home / Self Care | Payer: Medicaid Other | Source: Ambulatory Visit | Attending: Adult Health | Admitting: Adult Health

## 2021-06-18 DIAGNOSIS — Z1231 Encounter for screening mammogram for malignant neoplasm of breast: Secondary | ICD-10-CM | POA: Diagnosis not present

## 2021-06-19 ENCOUNTER — Encounter (HOSPITAL_COMMUNITY): Payer: Self-pay | Admitting: Physical Therapy

## 2021-06-19 ENCOUNTER — Ambulatory Visit (HOSPITAL_COMMUNITY): Payer: Medicaid Other | Admitting: Physical Therapy

## 2021-06-19 DIAGNOSIS — M6281 Muscle weakness (generalized): Secondary | ICD-10-CM

## 2021-06-19 DIAGNOSIS — M545 Low back pain, unspecified: Secondary | ICD-10-CM

## 2021-06-19 DIAGNOSIS — M79604 Pain in right leg: Secondary | ICD-10-CM

## 2021-06-19 DIAGNOSIS — R29898 Other symptoms and signs involving the musculoskeletal system: Secondary | ICD-10-CM

## 2021-06-19 DIAGNOSIS — R2689 Other abnormalities of gait and mobility: Secondary | ICD-10-CM
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
## 2021-06-21 ENCOUNTER — Encounter (HOSPITAL_COMMUNITY): Payer: Medicaid Other | Admitting: Physical Therapy

## 2021-06-22 ENCOUNTER — Encounter (HOSPITAL_COMMUNITY): Payer: Medicaid Other | Admitting: Physical Therapy

## 2021-06-25 ENCOUNTER — Telehealth: Payer: Self-pay

## 2021-06-25 MED ORDER — TRESIBA FLEXTOUCH 200 UNIT/ML ~~LOC~~ SOPN: 80.0000 [IU] | PEN_INJECTOR | Freq: Every day | SUBCUTANEOUS | 0 refills | Status: DC

## 2021-06-25 NOTE — Telephone Encounter (Signed)
Patient states that her PCP is no longer practicing, she said that he had her on [place] and she is needing a RX written from [place] and will need a PA on it. She is having the Pharmacy sending it over to [place]. Please Advise

## 2021-06-26 ENCOUNTER — Encounter (HOSPITAL_COMMUNITY): Payer: Medicaid Other | Admitting: Physical Therapy

## 2021-06-26 ENCOUNTER — Encounter (HOSPITAL_COMMUNITY): Payer: Medicaid Other

## 2021-06-26 NOTE — Telephone Encounter (Signed)
It is requiring a PA, information in your box,

## 2021-06-26 NOTE — Telephone Encounter (Signed)
Noted, will start PA.

## 2021-06-27 ENCOUNTER — Other Ambulatory Visit: Payer: Self-pay

## 2021-06-27 DIAGNOSIS — E1159 Type 2 diabetes mellitus with other circulatory complications: Secondary | ICD-10-CM

## 2021-06-27 MED ORDER — INSULIN GLARGINE (2 UNIT DIAL) 300 UNIT/ML ~~LOC~~ SOPN
80.0000 [IU] | PEN_INJECTOR | Freq: Every day | SUBCUTANEOUS | 0 refills | Status: DC
Start: 2021-06-27 — End: 2021-06-29

## 2021-06-28 ENCOUNTER — Encounter (HOSPITAL_COMMUNITY): Payer: Medicaid Other | Admitting: Physical Therapy

## 2021-06-29 ENCOUNTER — Other Ambulatory Visit: Payer: Self-pay

## 2021-06-29 DIAGNOSIS — E1159 Type 2 diabetes mellitus with other circulatory complications: Secondary | ICD-10-CM

## 2021-06-29 MED ORDER — INSULIN DETEMIR 100 UNIT/ML FLEXPEN
80.0000 [IU] | PEN_INJECTOR | Freq: Every day | SUBCUTANEOUS | 0 refills | Status: DC
Start: 2021-06-29 — End: 2022-04-11

## 2021-06-29 NOTE — Telephone Encounter (Signed)
Tried to change pt to toujeo max which also requires a PA. Insurance will cover levemir, will send in a Rx for levemir.

## 2021-07-02 ENCOUNTER — Encounter (HOSPITAL_COMMUNITY): Payer: Medicaid Other

## 2021-07-03 ENCOUNTER — Encounter (HOSPITAL_COMMUNITY): Payer: Medicaid Other | Admitting: Physical Therapy

## 2021-07-04 ENCOUNTER — Encounter (HOSPITAL_COMMUNITY): Payer: Medicaid Other

## 2021-07-13 ENCOUNTER — Ambulatory Visit: Payer: Medicaid Other | Admitting: "Endocrinology

## 2021-07-13 ENCOUNTER — Telehealth: Payer: Self-pay

## 2021-07-13 NOTE — Telephone Encounter (Signed)
Per Dr Posey [facility] will not take this patient for Primary Care 07/13/21

## 2021-07-16 ENCOUNTER — Other Ambulatory Visit (HOSPITAL_COMMUNITY)
Admission: RE | Admit: 2021-07-16 | Discharge: 2021-07-16 | Disposition: A | Discharge status: Home / Self Care | Payer: Medicaid Other | Source: Ambulatory Visit | Attending: Obstetrics & Gynecology | Admitting: Obstetrics & Gynecology

## 2021-07-16 ENCOUNTER — Other Ambulatory Visit: Payer: Self-pay

## 2021-07-16 ENCOUNTER — Other Ambulatory Visit (INDEPENDENT_AMBULATORY_CARE_PROVIDER_SITE_OTHER): Payer: Medicaid Other

## 2021-07-16 DIAGNOSIS — N898 Other specified noninflammatory disorders of vagina: Secondary | ICD-10-CM

## 2021-07-16 NOTE — Progress Notes (Addendum)
   NURSE VISIT- VAGINITIS  SUBJECTIVE:  Yolanda [name] is a 50 y.o. [id number] GYN patientfemale here for a vaginal swab for vaginitis screening.  She reports the following symptoms: burning, discharge described as white, clear, and malodorous, local irritation, pain, and vulvar itching for 1 weeks. Denies abnormal vaginal bleeding, significant pelvic pain, fever, or UTI symptoms.  OBJECTIVE:  There were no vitals taken for this visit.  Appears well, in no apparent distress  ASSESSMENT: Vaginal swab for vaginitis screening  PLAN: Self-collected vaginal probe for Bacterial Vaginosis, Yeast sent to lab Treatment: to be determined once results are received Follow-up as needed if symptoms persist/worsen, or new symptoms develop  Narissa Beaufort A Haley Roza  07/16/2021 3:59 PM   Chart reviewed for nurse visit. Agree with plan of care.  Roma Schanz, [place] 07/16/2021 4:44 PM

## 2021-07-18 LAB — CERVICOVAGINAL ANCILLARY ONLY
Bacterial Vaginitis (gardnerella): NEGATIVE
Candida Glabrata: POSITIVE — AB
Candida Vaginitis: NEGATIVE
Comment: NEGATIVE
Comment: NEGATIVE
Comment: NEGATIVE

## 2021-07-19 ENCOUNTER — Other Ambulatory Visit: Payer: Self-pay | Admitting: Women's Health

## 2021-07-19 ENCOUNTER — Telehealth: Payer: Self-pay | Admitting: Women's Health

## 2021-07-19 MED ORDER — BORIC ACID VAGINAL 600 MG VA SUPP: 600.0000 mg | Freq: Every day | VAGINAL | 0 refills | Status: DC

## 2021-07-19 NOTE — Telephone Encounter (Signed)
Patient called and said that medicaid doesn't cover medicine that was prescribed. Wanting to know if something else can be called in that will be covered by her insurance. She said her primary pharmacy is [facility].

## 2021-08-03 DIAGNOSIS — J302 Other seasonal allergic rhinitis: Secondary | ICD-10-CM | POA: Insufficient documentation

## 2021-08-03 LAB — HEPATIC FUNCTION PANEL
ALT: 13 (ref 7–35)
AST: 10 — AB (ref 13–35)
Alkaline Phosphatase: 124 (ref 25–125)
Bilirubin, Total: 0.2

## 2021-08-03 LAB — BASIC METABOLIC PANEL
BUN: 20 (ref 4–21)
CO2: 19 (ref 13–22)
Chloride: 103 (ref 99–108)
Creatinine: 0.7 (ref 0.5–1.1)
Glucose: 312
Potassium: 4.4 (ref 3.4–5.3)
Sodium: 137 (ref 137–147)

## 2021-08-03 LAB — LIPID PANEL
Cholesterol: 168 (ref 0–200)
HDL: 27 — AB (ref 35–70)
LDL Cholesterol: 45
Triglycerides: 672 — AB (ref 40–160)

## 2021-08-03 LAB — COMPREHENSIVE METABOLIC PANEL
Albumin: 3.8 (ref 3.5–5.0)
Calcium: 9.2 (ref 8.7–10.7)
Globulin: 2.4

## 2021-08-03 LAB — VITAMIN D 25 HYDROXY (VIT D DEFICIENCY, FRACTURES): Vit D, 25-Hydroxy: 11.5

## 2021-08-03 LAB — TSH: TSH: 1.12 (ref 0.41–5.90)

## 2021-08-08 ENCOUNTER — Encounter (HOSPITAL_BASED_OUTPATIENT_CLINIC_OR_DEPARTMENT_OTHER): Payer: Medicaid Other | Admitting: Physician Assistant

## 2021-08-16 ENCOUNTER — Ambulatory Visit: Payer: Medicaid Other | Admitting: "Endocrinology

## 2021-08-20 ENCOUNTER — Other Ambulatory Visit: Payer: Self-pay | Admitting: Obstetrics & Gynecology

## 2021-09-01 LAB — COLOGUARD: COLOGUARD: NEGATIVE

## 2021-09-01 LAB — EXTERNAL GENERIC LAB PROCEDURE: COLOGUARD: NEGATIVE

## 2021-09-25 ENCOUNTER — Other Ambulatory Visit: Payer: Self-pay

## 2021-09-25 ENCOUNTER — Encounter (HOSPITAL_BASED_OUTPATIENT_CLINIC_OR_DEPARTMENT_OTHER): Payer: Medicaid Other | Attending: Internal Medicine | Admitting: Internal Medicine

## 2021-09-25 DIAGNOSIS — E11621 Type 2 diabetes mellitus with foot ulcer: Secondary | ICD-10-CM | POA: Insufficient documentation

## 2021-09-25 NOTE — Progress Notes (Signed)
Yolanda [name], Yolanda [name] ([id number]) Visit Report for 09/25/2021 Abuse/Suicide Risk Screen Details Patient Name: Date of Service: Yolanda [name], Yolanda [name] 09/25/2021 9:00 [name] M Medical Record Number: [id number] Patient Account Number: [id number] Date of Birth/Sex: Treating RN: 08/02/1971 (50 y.o. [name] Primary Care [name]: [name] Other Clinician: Referring [name]: Treating [name] [name] Weeks in Treatment: 0 Abuse/Suicide Risk Screen Items Answer ABUSE RISK SCREEN: Has anyone close to you tried to hurt or harm you recentlyo No Do you feel uncomfortable with anyone in your familyo No Has anyone forced you do things that you didnt want to doo No Electronic Signature(s) Signed: 09/25/2021 4:59:36 PM By: [name] Entered By: [name] on 09/25/2021 08:56:20 -------------------------------------------------------------------------------- Activities of Daily Living Details Patient Name: Date of Service: Yolanda [name], Yolanda [name] 09/25/2021 9:00 [name] M Medical Record Number: [id number] Patient Account Number: [id number] Date of Birth/Sex: Treating RN: 11/05/1971 (50 y.o. [name] Primary Care Haneen Bernales: [name] Other Clinician: Referring [name] Treating [name] [name] Weeks in Treatment: 0 Activities of Daily Living Items Answer Activities of Daily Living (Please select one for each item) Drive Automobile Completely Able T Medications ake Completely Able Use T elephone Completely Able Care for Appearance Completely Able Use T oilet Completely Able Bath / Shower Completely Able Dress Self Completely Able Feed Self Completely Able Walk Completely Able Get In / Out Bed Completely Able Housework Completely Able Prepare Meals Completely [place] for Self Completely Able Electronic Signature(s) Signed: 09/25/2021 4:59:36 PM By: [name] Entered By: [name] on 09/25/2021 08:56:47 -------------------------------------------------------------------------------- Education Screening Details Patient Name: Date of Service: Yolanda [name]. 09/25/2021 9:00 [name] M Medical Record Number: [id number] Patient Account Number: [id number] Date of Birth/Sex: Treating RN: 11-08-1971 (51 y.o. [name] Primary Care Bedelia Pong: [name] Other Clinician: Referring [name] Treating [name] [name] in Treatment: 0 Primary Learner Assessed: Patient Learning Preferences/Education Level/Primary Language Learning Preference: Explanation, Demonstration, Printed Material Highest Education Level: High School Preferred Language: English Cognitive Barrier Language Barrier: No Translator Needed: No Memory Deficit: No Emotional Barrier: No Cultural/Religious Beliefs Affecting Medical Care: No Physical Barrier Impaired Vision: Yes Glasses Impaired Hearing: No Decreased Hand dexterity: No Knowledge/Comprehension Knowledge Level: Medium Comprehension Level: Medium Ability to understand written instructions: Medium Ability to understand verbal instructions: Medium Motivation Anxiety Level: Calm Cooperation: Cooperative Education Importance: Acknowledges Need Interest in Health Problems: Asks Questions Perception: Coherent Willingness to Engage in Self-Management Medium Activities: Readiness to Engage in Self-Management Medium Activities: Electronic Signature(s) Signed: 09/25/2021 4:59:36 PM By: [name] Entered By: [name] on 09/25/2021 08:57:58 -------------------------------------------------------------------------------- Fall Risk Assessment Details Patient Name: Date of Service: Yolanda [name]. 09/25/2021 9:00 [name] M Medical Record Number: [id number] Patient Account Number: [id number] Date of Birth/Sex: Treating RN: 1971/04/11 (50 y.o. [name] Primary Care Anselm Aumiller: [name] Other Clinician: Referring [name]: Treating [name] [name] Weeks in Treatment: 0 Fall Risk Assessment Items Have you had 2 or more falls in the last 12 monthso 0 No Have you had any fall that resulted in injury in the last 12 monthso 0 No FALLS RISK SCREEN History of falling - immediate or within 3 months 25 Yes Secondary diagnosis (Do you have 2 or more medical diagnoseso) 15 Yes Ambulatory aid None/bed rest/wheelchair/nurse 0 Yes Crutches/cane/walker 0 No Furniture 0 No Intravenous therapy Access/Saline/Heparin Lock 0 No Gait/Transferring Normal/ bed rest/ wheelchair 0 No Weak (  short steps with or without shuffle, stooped but able to lift head while walking, may seek 10 Yes support from furniture) Impaired (short steps with shuffle, may have difficulty arising from chair, head down, impaired 0 No balance) Mental Status Oriented to own ability 0 Yes Electronic Signature(s) Signed: 09/25/2021 4:59:36 PM By: [name] Entered By: [name] on 09/25/2021 08:57:12 -------------------------------------------------------------------------------- Foot Assessment Details Patient Name: Date of Service: Yolanda [name]. 09/25/2021 9:00 [name] M Medical Record Number: [id number] Patient Account Number: [id number] Date of Birth/Sex: Treating RN: Apr 11, 1971 (50 y.o. [name] Primary Care [name] [name] Other Clinician: Referring [name] Treating [name] [name] Weeks in Treatment: 0 Foot Assessment Items Site Locations + = Sensation present, - = Sensation absent, C = Callus, U = Ulcer R = Redness, W = Warmth, M = Maceration, PU = Pre-ulcerative lesion F = Fissure, S = Swelling, D = Dryness Assessment Right: Left: Other Deformity: No No Prior Foot Ulcer: No No Prior Amputation: No No Charcot Joint: No  No Ambulatory Status: Ambulatory Without Help Gait: Steady Electronic Signature(s) Signed: 09/25/2021 4:59:36 PM By: [name] Entered By: [name] on 09/25/2021 08:59:43 -------------------------------------------------------------------------------- Nutrition Risk Screening Details Patient Name: Date of Service: Yolanda [name], Yolanda [name] 09/25/2021 9:00 [name] M Medical Record Number: [id number] Patient Account Number: [id number] Date of Birth/Sex: Treating RN: 1971/06/04 (50 y.o. [name] Primary Care Milos Milligan: [name] Other Clinician: Referring Denisse Whitenack: Treating Shawnice Tilmon/Extender: [name] Weeks in Treatment: 0 Height (in): 67 Weight (lbs): 220 Body Mass Index (BMI): 34.5 Nutrition Risk Screening Items Score Screening NUTRITION RISK SCREEN: I have an illness or condition that made me change the kind and/or amount of food I eat 0 No I eat fewer than two meals per day 0 No I eat few fruits and vegetables, or milk products 0 No I have three or more drinks of beer, liquor or wine almost every day 0 No I have tooth or mouth problems that make it hard for me to eat 0 No I don't always have enough money to buy the food I need 0 No I eat alone most of the time 0 No I take three or more different prescribed or over-the-counter drugs [name] day 1 Yes Without wanting to, I have lost or gained 10 pounds in the last six months 0 No I am not always physically able to shop, cook and/or feed myself 0 No Nutrition Protocols Good Risk Protocol 0 No interventions needed Moderate Risk Protocol High Risk Proctocol Risk Level: Good Risk Score: 1 Electronic Signature(s) Signed: 09/25/2021 4:59:36 PM By: [name] Entered By: [name] on 09/25/2021 08:58:11

## 2021-09-28 ENCOUNTER — Ambulatory Visit (HOSPITAL_COMMUNITY)
Admission: RE | Admit: 2021-09-28 | Discharge: 2021-09-28 | Disposition: A | Discharge status: Home / Self Care | Payer: Medicaid Other | Source: Ambulatory Visit | Attending: Internal Medicine | Admitting: Internal Medicine

## 2021-09-28 ENCOUNTER — Other Ambulatory Visit (HOSPITAL_COMMUNITY): Payer: Self-pay

## 2021-09-28 ENCOUNTER — Other Ambulatory Visit: Payer: Self-pay

## 2021-09-28 DIAGNOSIS — L97519 Non-pressure chronic ulcer of other part of right foot with unspecified severity: Secondary | ICD-10-CM | POA: Insufficient documentation

## 2021-09-28 DIAGNOSIS — E13621 Other specified diabetes mellitus with foot ulcer: Secondary | ICD-10-CM | POA: Diagnosis present

## 2021-10-02 ENCOUNTER — Encounter (HOSPITAL_BASED_OUTPATIENT_CLINIC_OR_DEPARTMENT_OTHER): Payer: Medicaid Other | Admitting: Internal Medicine

## 2021-10-10 ENCOUNTER — Other Ambulatory Visit: Payer: Self-pay

## 2021-10-10 ENCOUNTER — Encounter (HOSPITAL_BASED_OUTPATIENT_CLINIC_OR_DEPARTMENT_OTHER): Payer: Medicaid Other | Attending: Internal Medicine | Admitting: Internal Medicine

## 2021-10-10 DIAGNOSIS — G4733 Obstructive sleep apnea (adult) (pediatric): Secondary | ICD-10-CM | POA: Insufficient documentation

## 2021-10-10 DIAGNOSIS — E11621 Type 2 diabetes mellitus with foot ulcer: Secondary | ICD-10-CM | POA: Insufficient documentation

## 2021-10-10 DIAGNOSIS — L97518 Non-pressure chronic ulcer of other part of right foot with other specified severity: Secondary | ICD-10-CM | POA: Insufficient documentation

## 2021-10-10 DIAGNOSIS — E1142 Type 2 diabetes mellitus with diabetic polyneuropathy: Secondary | ICD-10-CM | POA: Insufficient documentation
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
## 2021-10-12 NOTE — Progress Notes (Signed)
OLUWASEUN, BRUYERE ([id number]) Visit Report for 10/10/2021 Arrival Information Details Patient Name: Date of Service: CALENA, SALEM 10/10/2021 10:30 A M Medical Record Number: [id number] Patient Account Number: [id number] Date of Birth/Sex: Treating RN: 07-21-1971 (49 y.o. Nancy Fetter Primary Care Jadarian Mckay: Pablo Lawrence Other Clinician: Referring Royelle Hinchman: Treating Sarrinah Gardin/Extender: [name] in Treatment: 2 Visit Information History Since Last Visit Added or deleted any medications: No Patient Arrived: Kasandra Knudsen Any new allergies or adverse reactions: No Arrival Time: 11:00 Had a fall or experienced change in No Accompanied By: self activities of daily living that may affect Transfer Assistance: None risk of falls: Patient Identification Verified: Yes Signs or symptoms of abuse/neglect since last visito No Secondary Verification Process Completed: Yes Hospitalized since last visit: No Patient Requires Transmission-Based Precautions: No Implantable device outside of the clinic excluding No Patient Has Alerts: Yes cellular tissue based products placed in the center Patient Alerts: Patient on Blood Thinner since last visit: Has Dressing in Place as Prescribed: Yes Pain Present Now: No Electronic Signature(s) Signed: 10/10/2021 1:53:08 PM By: Sandre Kitty Entered By: Sandre Kitty on 10/10/2021 11:01:07 -------------------------------------------------------------------------------- Encounter Discharge Information Details Patient Name: Date of Service: [name] 10/10/2021 10:30 A M Medical Record Number: [id number] Patient Account Number: [id number] Date of Birth/Sex: Treating RN: 1971/02/10 (50 y.o. Nancy Fetter Primary Care Ellory Khurana: Pablo Lawrence Other Clinician: Referring Allex Lapoint: Treating Zianna Dercole/Extender: [name] in Treatment: 2 Encounter Discharge Information Items Post  Procedure Vitals Discharge Condition: Stable Temperature (F): 98.1 Ambulatory Status: Ambulatory Pulse (bpm): 118 Discharge Destination: Home Respiratory Rate (breaths/min): 18 Transportation: Private Auto Blood Pressure (mmHg): 100/70 Accompanied By: alone Schedule Follow-up Appointment: Yes Clinical Summary of Care: Patient Declined Electronic Signature(s) Signed: 10/11/2021 5:34:00 PM By: Levan Hurst RN, BSN Entered By: Levan Hurst on 10/10/2021 16:05:52 -------------------------------------------------------------------------------- Lower Extremity Assessment Details Patient Name: Date of Service: [name] 10/10/2021 10:30 A M Medical Record Number: [id number] Patient Account Number: [id number] Date of Birth/Sex: Treating RN: 04/25/1971 (50 y.o. [name], [name] Primary Care Nadie Fiumara: Pablo Lawrence Other Clinician: Referring Zaylie Gisler: Treating Asiana Benninger/Extender: [name] Weeks in Treatment: 2 Edema Assessment Assessed: Shirlyn Goltz: No] Patrice Paradise: Yes] Edema: [Left: Ye] [Right: s] Calf Left: Right: Point of Measurement: 31 cm From Medial Instep 34 cm Ankle Left: Right: Point of Measurement: 9 cm From Medial Instep 22 cm Vascular Assessment Pulses: Dorsalis Pedis Palpable: [Right:Yes] Posterior Tibial Palpable: [Right:Yes] Electronic Signature(s) Signed: 10/12/2021 12:27:53 PM By: Rhae Hammock RN Entered By: Rhae Hammock on 10/10/2021 11:14:26 -------------------------------------------------------------------------------- Multi Wound Chart Details Patient Name: Date of Service: [name] 10/10/2021 10:30 A M Medical Record Number: [id number] Patient Account Number: [id number] Date of Birth/Sex: Treating RN: 1971/05/21 (50 y.o. Nancy Fetter Primary Care Aariyah Sampey: Pablo Lawrence Other Clinician: Referring Oluwadara Gorman: Treating Breeona Waid/Extender: [name] Weeks in  Treatment: 2 Vital Signs Height(in): 67 Pulse(bpm): 118 Weight(lbs): 220 Blood Pressure(mmHg): 100/70 Body Mass Index(BMI): 34 Temperature(F): 98.1 Respiratory Rate(breaths/min): 18 Photos: [1:Right, Plantar Foot] [N/A:N/A N/A] Wound Location: [1:Gradually Appeared] [N/A:N/A] Wounding Event: [1:Diabetic Wound/Ulcer of the Lower] [N/A:N/A] Primary Etiology: [1:Extremity Sleep Apnea, Congestive Heart] [N/A:N/A] Comorbid History: [1:Failure, Coronary Artery Disease, Hypertension, Myocardial Infarction, Type II Diabetes, Osteoarthritis, Neuropathy 09/01/2020] [N/A:N/A] Date Acquired: [1:2] [N/A:N/A] Weeks of Treatment: [1:Open] [N/A:N/A] Wound Status: [1:0.4x0.5x0.2] [N/A:N/A] Measurements L x W x D (cm) [1:0.157] [N/A:N/A] A (cm) : rea [1:0.031] [N/A:N/A] Volume (cm) : [1:90.50%] [N/A:N/A] % Reduction in A [1:rea: 93.70%] [N/A:N/A] % Reduction  in Volume: [1:Grade 1] [N/A:N/A] Classification: [1:Medium] [N/A:N/A] Exudate A mount: [1:Serosanguineous] [N/A:N/A] Exudate Type: [1:red, brown] [N/A:N/A] Exudate Color: [1:Distinct, outline attached] [N/A:N/A] Wound Margin: [1:Small (1-33%)] [N/A:N/A] Granulation A mount: [1:Red] [N/A:N/A] Granulation Quality: [1:Large (67-100%)] [N/A:N/A] Necrotic A mount: [1:Eschar] [N/A:N/A] Necrotic Tissue: [1:Fat Layer (Subcutaneous Tissue): Yes N/A] Exposed Structures: [1:Fascia: No Tendon: No Muscle: No Joint: No Bone: No Medium (34-66%)] [N/A:N/A] Epithelialization: [1:Debridement - Selective/Open Wound N/A] Debridement: Pre-procedure Verification/Time Out 11:24 [N/A:N/A] Taken: [1:Necrotic/Eschar, Callus] [N/A:N/A] Tissue Debrided: [1:Skin/Epidermis] [N/A:N/A] Level: [1:0.25] [N/A:N/A] Debridement A (sq cm): [1:rea Curette] [N/A:N/A] Instrument: [1:None] [N/A:N/A] Bleeding: [1:0] [N/A:N/A] Procedural Pain: [1:0] [N/A:N/A] Post Procedural Pain: [1:Procedure was tolerated well] [N/A:N/A] Debridement Treatment Response: [1:0.4x0.5x0.2]  [N/A:N/A] Post Debridement Measurements L x W x D (cm) [1:0.031] [N/A:N/A] Post Debridement Volume: (cm) [1:Debridement] [N/A:N/A] Treatment Notes Electronic Signature(s) Signed: 10/10/2021 4:30:00 PM By: Linton Ham MD Signed: 10/11/2021 5:34:00 PM By: Levan Hurst RN, BSN Entered By: Linton Ham on 10/10/2021 12:21:16 -------------------------------------------------------------------------------- Multi-Disciplinary Care Plan Details Patient Name: Date of Service: [name] 10/10/2021 10:30 A M Medical Record Number: [id number] Patient Account Number: [id number] Date of Birth/Sex: Treating RN: [birth year] (49 y.o. [name], [name] Primary Care Tessah Patchen: Pablo Lawrence Other Clinician: Referring Amoree Newlon: Treating Elester Apodaca/Extender: [name] in Treatment: 2 Active Inactive Abuse / Safety / Falls / Self Care Management Nursing Diagnoses: History of Falls Goals: Patient will remain injury free related to falls Date Initiated: 09/25/2021 Target Resolution Date: 10/23/2021 Goal Status: Active Interventions: Assess fall risk on admission and as needed Provide education on fall prevention Notes: Nutrition Nursing Diagnoses: Impaired glucose control: actual or potential Goals: Patient/caregiver verbalizes understanding of need to maintain therapeutic glucose control per primary care physician Date Initiated: 09/25/2021 Target Resolution Date: 10/23/2021 Goal Status: Active Interventions: Assess HgA1c results as ordered upon admission and as needed Provide education on elevated blood sugars and impact on wound healing Provide education on nutrition Notes: Wound/Skin Impairment Nursing Diagnoses: Impaired tissue integrity Goals: Patient/caregiver will verbalize understanding of skin care regimen Date Initiated: 09/25/2021 Target Resolution Date: 10/23/2021 Goal Status: Active Ulcer/skin breakdown will have a volume  reduction of 30% by week 4 Date Initiated: 09/25/2021 Target Resolution Date: 10/23/2021 Goal Status: Active Interventions: Assess patient/caregiver ability to obtain necessary supplies Assess patient/caregiver ability to perform ulcer/skin care regimen upon admission and as needed Assess ulceration(s) every visit Provide education on ulcer and skin care Treatment Activities: Topical wound management initiated : 09/25/2021 Notes: Electronic Signature(s) Signed: 10/12/2021 12:27:53 PM By: Rhae Hammock RN Entered By: Rhae Hammock on 10/10/2021 11:15:29 -------------------------------------------------------------------------------- Pain Assessment Details Patient Name: Date of Service: [name] 10/10/2021 10:30 A M Medical Record Number: [id number] Patient Account Number: [id number] Date of Birth/Sex: Treating RN: 24-Feb-1971 (50 y.o. Nancy Fetter Primary Care Carolyn Maniscalco: Pablo Lawrence Other Clinician: Referring Chanda Laperle: Treating Annjanette Wertenberger/Extender: [name] Weeks in Treatment: 2 Active Problems Location of Pain Severity and Description of Pain Patient Has Paino No Site Locations Pain Management and Medication Current Pain Management: Electronic Signature(s) Signed: 10/10/2021 1:53:08 PM By: Sandre Kitty Signed: 10/11/2021 5:34:00 PM By: Levan Hurst RN, BSN Entered By: Sandre Kitty on 10/10/2021 11:01:58 -------------------------------------------------------------------------------- Patient/Caregiver Education Details Patient Name: Date of Service: [name] 11/9/2022andnbsp10:30 A M Medical Record Number: [id number] Patient Account Number: [id number] Date of Birth/Gender: Treating RN: 11/25/1971 (50 y.o. [name], [name] Primary Care Physician: Pablo Lawrence Other Clinician: Referring Physician: Treating Physician/Extender: [name] in Treatment: 2 Education  Assessment  Education Provided To: Patient Education Topics Provided Elevated Blood Sugar/ Impact on Healing: Methods: Explain/Verbal Responses: State content correctly Electronic Signature(s) Signed: 10/12/2021 12:27:53 PM By: Rhae Hammock RN Entered By: Rhae Hammock on 10/10/2021 11:17:21 -------------------------------------------------------------------------------- Wound Assessment Details Patient Name: Date of Service: [name] 10/10/2021 10:30 A M Medical Record Number: [id number] Patient Account Number: [id number] Date of Birth/Sex: Treating RN: 1971/02/13 (50 y.o. Nancy Fetter Primary Care Kwabena Strutz: Pablo Lawrence Other Clinician: Referring Juluis Fitzsimmons: Treating Areen Trautner/Extender: [name] Weeks in Treatment: 2 Wound Status Wound Number: 1 Primary Diabetic Wound/Ulcer of the Lower Extremity Etiology: Wound Location: Right, Plantar Foot Wound Open Wounding Event: Gradually Appeared Status: Date Acquired: 09/01/2020 Comorbid Sleep Apnea, Congestive Heart Failure, Coronary Artery Disease, Weeks Of Treatment: 2 History: Hypertension, Myocardial Infarction, Type II Diabetes, Clustered Wound: No Osteoarthritis, Neuropathy Photos Wound Measurements Length: (cm) 0.4 Width: (cm) 0.5 Depth: (cm) 0.2 Area: (cm) 0.157 Volume: (cm) 0.031 % Reduction in Area: 90.5% % Reduction in Volume: 93.7% Epithelialization: Medium (34-66%) Tunneling: No Undermining: No Wound Description Classification: Grade 1 Wound Margin: Distinct, outline attached Exudate Amount: Medium Exudate Type: Serosanguineous Exudate Color: red, brown Foul Odor After Cleansing: No Slough/Fibrino No Wound Bed Granulation Amount: Small (1-33%) Exposed Structure Granulation Quality: Red Fascia Exposed: No Necrotic Amount: Large (67-100%) Fat Layer (Subcutaneous Tissue) Exposed: Yes Necrotic Quality: Eschar Tendon Exposed: No Muscle Exposed:  No Joint Exposed: No Bone Exposed: No Treatment Notes Wound #1 (Foot) Wound Laterality: Plantar, Right Cleanser Soap and Water Discharge Instruction: May shower and wash wound with dial antibacterial soap and water prior to dressing change. Peri-Wound Care Topical Primary Dressing KerraCel Ag Gelling Fiber Dressing, 2x2 in (silver alginate) Discharge Instruction: Apply silver alginate to wound bed as instructed Secondary Dressing Woven Gauze Sponges 2x2 in Discharge Instruction: Apply over primary dressing as directed. Optifoam Non-Adhesive Dressing, 4x4 in Discharge Instruction: Foam donut Secured With Conforming Stretch Gauze Bandage, Sterile 2x75 (in/in) Discharge Instruction: Secure with stretch gauze as directed. Compression Wrap Compression Stockings Add-Ons Electronic Signature(s) Signed: 10/11/2021 5:34:00 PM By: Levan Hurst RN, BSN Entered By: Levan Hurst on 10/10/2021 11:27:43 -------------------------------------------------------------------------------- Vitals Details Patient Name: Date of Service: [name] 10/10/2021 10:30 A M Medical Record Number: [id number] Patient Account Number: [id number] Date of Birth/Sex: Treating RN: Apr 17, 1971 (50 y.o. Nancy Fetter Primary Care Abelina Ketron: Pablo Lawrence Other Clinician: Referring Caliya Narine: Treating Riniyah Speich/Extender: [name] Weeks in Treatment: 2 Vital Signs Time Taken: 11:01 Temperature (F): 98.1 Height (in): 67 Pulse (bpm): 118 Weight (lbs): 220 Respiratory Rate (breaths/min): 18 Body Mass Index (BMI): 34.5 Blood Pressure (mmHg): 100/70 Reference Range: 80 - 120 mg / dl Electronic Signature(s) Signed: 10/10/2021 1:53:08 PM By: Sandre Kitty Entered By: Sandre Kitty on 10/10/2021 11:01:44

## 2021-10-16 ENCOUNTER — Encounter (HOSPITAL_BASED_OUTPATIENT_CLINIC_OR_DEPARTMENT_OTHER): Payer: Medicaid Other | Admitting: Internal Medicine

## 2021-10-16 DIAGNOSIS — E1142 Type 2 diabetes mellitus with diabetic polyneuropathy: Secondary | ICD-10-CM | POA: Diagnosis not present

## 2021-10-16 DIAGNOSIS — G4733 Obstructive sleep apnea (adult) (pediatric): Secondary | ICD-10-CM | POA: Diagnosis not present

## 2021-10-16 DIAGNOSIS — E11621 Type 2 diabetes mellitus with foot ulcer: Secondary | ICD-10-CM | POA: Diagnosis present

## 2021-10-16 DIAGNOSIS — L97518 Non-pressure chronic ulcer of other part of right foot with other specified severity: Secondary | ICD-10-CM | POA: Diagnosis not present

## 2021-10-30 ENCOUNTER — Encounter (HOSPITAL_BASED_OUTPATIENT_CLINIC_OR_DEPARTMENT_OTHER): Payer: Medicaid Other | Admitting: Internal Medicine

## 2021-10-30 ENCOUNTER — Other Ambulatory Visit: Payer: Self-pay

## 2021-10-30 DIAGNOSIS — E11621 Type 2 diabetes mellitus with foot ulcer: Secondary | ICD-10-CM | POA: Diagnosis not present

## 2021-10-30 LAB — GLUCOSE, CAPILLARY: Glucose-Capillary: 220 mg/dL — ABNORMAL HIGH (ref 70–99)

## 2021-10-30 NOTE — Progress Notes (Signed)
Yolanda [name], Yolanda [name] ([id number]) Visit Report for 10/30/2021 Arrival Information Details Patient Name: Date of Service: Yolanda [name], Yolanda [name] 10/30/2021 1:00 PM Medical Record Number: [id number] Patient Account Number: [id number] Date of Birth/Sex: Treating RN: 06-23-1971 (50 y.o. [name] Primary Care Dontel Harshberger: Pablo Lawrence Other Clinician: Referring Magdalene Tardiff: Treating Takeru Bose/Extender: Drue Second in Treatment: 5 Visit Information History Since Last Visit Added or deleted any medications: No Patient Arrived: Ambulatory Any new allergies or adverse reactions: No Arrival Time: 13:44 Had a fall or experienced change in No Transfer Assistance: None activities of daily living that may affect Patient Identification Verified: Yes risk of falls: Secondary Verification Process Completed: Yes Signs or symptoms of abuse/neglect since No Patient Requires Transmission-Based Precautions: No last visito Patient Has Alerts: Yes Hospitalized since last visit: No Patient Alerts: Patient on Blood Thinner Implantable device outside of the clinic No excluding cellular tissue based products placed in the center since last visit: Has Dressing in Place as Prescribed: Yes Has Footwear/Offloading in Place as Yes Prescribed: Right: Surgical Shoe with Pressure Relief Insole Pain Present Now: No Electronic Signature(s) Signed: 10/30/2021 5:43:30 PM By: [name] Entered By: [name] on 10/30/2021 13:47:21 -------------------------------------------------------------------------------- Encounter Discharge Information Details Patient Name: Date of Service: Yolanda Anchors A. 10/30/2021 1:00 PM Medical Record Number: [id number] Patient Account Number: [id number] Date of Birth/Sex: Treating RN: February 22, 1971 (50 y.o. [name] Primary Care Navpreet Szczygiel: Pablo Lawrence Other Clinician: Referring Burgandy Hackworth: Treating Mahiya Kercheval/Extender: Drue Second in Treatment: 5 Encounter Discharge Information Items Post Procedure Vitals Discharge Condition: Stable Temperature (F): 97.9 Ambulatory Status: Ambulatory Pulse (bpm): 93 Discharge Destination: Home Respiratory Rate (breaths/min): 18 Transportation: Private Auto Blood Pressure (mmHg): 98/66 Schedule Follow-up Appointment: Yes Clinical Summary of Care: Provided on 10/30/2021 Form Type Recipient Paper Patient Patient Electronic Signature(s) Signed: 10/30/2021 2:27:10 PM By: [name] Entered By: [name] on 10/30/2021 14:27:10 -------------------------------------------------------------------------------- Lower Extremity Assessment Details Patient Name: Date of Service: Yolanda [name], Yolanda [name] 10/30/2021 1:00 PM Medical Record Number: [id number] Patient Account Number: [id number] Date of Birth/Sex: Treating RN: 1971-03-06 (50 y.o. [name] Primary Care Soo Steelman: Pablo Lawrence Other Clinician: Referring Dawsyn Zurn: Treating Peyten Punches/Extender: Drue Second in Treatment: 5 Edema Assessment Assessed: Shirlyn Goltz: No] [Right: Yes] Edema: [Left: N] [Right: o] Calf Left: Right: Point of Measurement: 31 cm From Medial Instep 35 cm Ankle Left: Right: Point of Measurement: 9 cm From Medial Instep 22.5 cm Vascular Assessment Pulses: Dorsalis Pedis Palpable: [Right:Yes] Electronic Signature(s) Signed: 10/30/2021 5:43:30 PM By: [name] Entered By: [name] on 10/30/2021 13:55:36 -------------------------------------------------------------------------------- Multi Wound Chart Details Patient Name: Date of Service: Yolanda Anchors A. 10/30/2021 1:00 PM Medical Record Number: [id number] Patient Account Number: [id number] Date of Birth/Sex: Treating RN: 06-Oct-1971 (50 y.o. F) Primary Care Manpreet Strey: Pablo Lawrence Other Clinician: Referring Arkin Imran: Treating Journee Kohen/Extender: Drue Second in Treatment: 5 Vital Signs Height(in): 67 Pulse(bpm): 93 Weight(lbs): 220 Blood Pressure(mmHg): 98/66 Body Mass Index(BMI): 34 Temperature(F): 97.9 Respiratory Rate(breaths/min): 18 Photos: [1:Right, Plantar Foot] [N/A:N/A N/A] Wound Location: [1:Gradually Appeared] [N/A:N/A] Wounding Event: [1:Diabetic Wound/Ulcer of the Lower] [N/A:N/A] Primary Etiology: [1:Extremity Sleep Apnea, Congestive Heart] [N/A:N/A] Comorbid History: [1:Failure, Coronary Artery Disease, Hypertension, Myocardial Infarction, Type II Diabetes, Osteoarthritis, Neuropathy 09/01/2020] [N/A:N/A] Date Acquired: [1:5] [N/A:N/A] Weeks of Treatment: [1:Open] [N/A:N/A] Wound Status: [1:1.5x1.5x0.3] [N/A:N/A] Measurements L x W x D (cm) [1:1.767] [N/A:N/A] A (cm) : rea [1:0.53] [N/A:N/A] Volume (cm) : [1:-7.20%] [N/A:N/A] % Reduction in A [1:rea: -7.10%] [N/A:N/A] % Reduction in Volume: [  1:7] Starting Position 1 (o'clock): [1:9] Ending Position 1 (o'clock): [1:0.9] Maximum Distance 1 (cm): [1:Yes] [N/A:N/A] Undermining: [1:Grade 1] [N/A:N/A] Classification: [1:Medium] [N/A:N/A] Exudate A mount: [1:Serosanguineous] [N/A:N/A] Exudate Type: [1:red, brown] [N/A:N/A] Exudate Color: [1:Distinct, outline attached] [N/A:N/A] Wound Margin: [1:Large (67-100%)] [N/A:N/A] Granulation A mount: [1:Red] [N/A:N/A] Granulation Quality: [1:Small (1-33%)] [N/A:N/A] Necrotic A mount: [1:Eschar] [N/A:N/A] Necrotic Tissue: [1:Fat Layer (Subcutaneous Tissue): Yes N/A] Exposed Structures: [1:Fascia: No Tendon: No Muscle: No Joint: No Bone: No Medium (34-66%)] [N/A:N/A] Epithelialization: [1:Debridement - Excisional] [N/A:N/A] Debridement: Pre-procedure Verification/Time Out 13:59 [N/A:N/A] Taken: [1:Other] [N/A:N/A] Pain Control: [1:Subcutaneous] [N/A:N/A] Tissue Debrided: [1:Skin/Subcutaneous Tissue] [N/A:N/A] Level: [1:2.25] [N/A:N/A] Debridement A (sq cm): [1:rea Blade, Forceps]  [N/A:N/A] Instrument: [1:Minimum] [N/A:N/A] Bleeding: [1:Pressure] [N/A:N/A] Hemostasis A chieved: [1:Procedure was tolerated well] [N/A:N/A] Debridement Treatment Response: [1:1.5x1.5x0.3] [N/A:N/A] Post Debridement Measurements L x W x D (cm) [1:0.53] [N/A:N/A] Post Debridement Volume: (cm) [1:Debridement] [N/A:N/A] Treatment Notes Electronic Signature(s) Signed: 10/30/2021 5:06:47 PM By: Linton Ham MD Entered By: Linton Ham on 10/30/2021 14:09:23 -------------------------------------------------------------------------------- Multi-Disciplinary Care Plan Details Patient Name: Date of Service: Yolanda Anchors A. 10/30/2021 1:00 PM Medical Record Number: [id number] Patient Account Number: [id number] Date of Birth/Sex: Treating RN: 1971/04/05 (50 y.o. [name] Primary Care Rondell Frick: Pablo Lawrence Other Clinician: Referring Rainna Nearhood: Treating Teasha Murrillo/Extender: Drue Second in Treatment: 5 Active Inactive Nutrition Nursing Diagnoses: Impaired glucose control: actual or potential Goals: Patient/caregiver verbalizes understanding of need to maintain therapeutic glucose control per primary care physician Date Initiated: 09/25/2021 Target Resolution Date: 11/27/2021 Goal Status: Active Interventions: Assess HgA1c results as ordered upon admission and as needed Provide education on elevated blood sugars and impact on wound healing Provide education on nutrition Notes: 10/30/21: Glucose control ongoing. Wound/Skin Impairment Nursing Diagnoses: Impaired tissue integrity Goals: Patient/caregiver will verbalize understanding of skin care regimen Date Initiated: 09/25/2021 Target Resolution Date: 11/20/2021 Goal Status: Active Ulcer/skin breakdown will have a volume reduction of 30% by week 4 Date Initiated: 09/25/2021 Target Resolution Date: 11/20/2021 Goal Status: Active Interventions: Assess patient/caregiver ability to  obtain necessary supplies Assess patient/caregiver ability to perform ulcer/skin care regimen upon admission and as needed Assess ulceration(s) every visit Provide education on ulcer and skin care Treatment Activities: Topical wound management initiated : 09/25/2021 Notes: 10/30/21: Wound not yet at 30%, undermining noted Electronic Signature(s) Signed: 10/30/2021 5:43:30 PM By: [name] Entered By: [name] on 10/30/2021 13:56:56 -------------------------------------------------------------------------------- Pain Assessment Details Patient Name: Date of Service: Yolanda [name], Yolanda [name] 10/30/2021 1:00 PM Medical Record Number: [id number] Patient Account Number: [id number] Date of Birth/Sex: Treating RN: 1971/03/23 (50 y.o. [name] Primary Care Kijana Cromie: Pablo Lawrence Other Clinician: Referring Shamya Macfadden: Treating Caine Barfield/Extender: Drue Second in Treatment: 5 Active Problems Location of Pain Severity and Description of Pain Patient Has Paino No Site Locations Pain Management and Medication Current Pain Management: Electronic Signature(s) Signed: 10/30/2021 5:43:30 PM By: [name] Entered By: [name] on 10/30/2021 13:48:55 -------------------------------------------------------------------------------- Patient/Caregiver Education Details Patient Name: Date of Service: Yolanda [name] 11/29/2022andnbsp1:00 PM Medical Record Number: [id number] Patient Account Number: [id number] Date of Birth/Gender: Treating RN: 09/05/1971 (49 y.o. [name] Primary Care Physician: Pablo Lawrence Other Clinician: Referring Physician: Treating Physician/Extender: Drue Second in Treatment: 5 Education Assessment Education Provided To: Patient Education Topics Provided Elevated Blood Sugar/ Impact on Healing: Methods: Explain/Verbal, Printed Responses: State content  correctly Offloading: Methods: Explain/Verbal, Printed Responses: State content correctly Wound/Skin Impairment: Methods: Explain/Verbal, Printed Responses: State content correctly Electronic Signature(s) Signed: 10/30/2021 5:43:30 PM By: Onnie Boer,  Lenna Sciara By: [name] on 10/30/2021 13:44:46 -------------------------------------------------------------------------------- Wound Assessment Details Patient Name: Date of Service: Yolanda [name], Yolanda [name] 10/30/2021 1:00 PM Medical Record Number: [id number] Patient Account Number: [id number] Date of Birth/Sex: Treating RN: [birth year] (50 y.o. [name] Primary Care Kendra Woolford: Pablo Lawrence Other Clinician: Referring Yehuda Printup: Treating Riko Lumsden/Extender: Drue Second in Treatment: 5 Wound Status Wound Number: 1 Primary Diabetic Wound/Ulcer of the Lower Extremity Etiology: Wound Location: Right, Plantar Foot Wound Open Wounding Event: Gradually Appeared Status: Date Acquired: 09/01/2020 Comorbid Sleep Apnea, Congestive Heart Failure, Coronary Artery Disease, Weeks Of Treatment: 5 History: Hypertension, Myocardial Infarction, Type II Diabetes, Clustered Wound: No Osteoarthritis, Neuropathy Photos Wound Measurements Length: (cm) 1.5 Width: (cm) 1.5 Depth: (cm) 0.3 Area: (cm) 1.767 Volume: (cm) 0.53 % Reduction in Area: -7.2% % Reduction in Volume: -7.1% Epithelialization: Medium (34-66%) Tunneling: No Undermining: Yes Starting Position (o'clock): 7 Ending Position (o'clock): 9 Maximum Distance: (cm) 0.9 Wound Description Classification: Grade 1 Wound Margin: Distinct, outline attached Exudate Amount: Medium Exudate Type: Serosanguineous Exudate Color: red, brown Foul Odor After Cleansing: No Slough/Fibrino No Wound Bed Granulation Amount: Large (67-100%) Exposed Structure Granulation Quality: Red Fascia Exposed: No Necrotic Amount: Small (1-33%) Fat Layer (Subcutaneous  Tissue) Exposed: Yes Necrotic Quality: Eschar Tendon Exposed: No Muscle Exposed: No Joint Exposed: No Bone Exposed: No Treatment Notes Wound #1 (Foot) Wound Laterality: Plantar, Right Cleanser Soap and Water Discharge Instruction: May shower and wash wound with dial antibacterial soap and water prior to dressing change. Peri-Wound Care Topical Primary Dressing KerraCel Ag Gelling Fiber Dressing, 2x2 in (silver alginate) Discharge Instruction: Apply silver alginate to wound bed as instructed Secondary Dressing Woven Gauze Sponges 2x2 in Discharge Instruction: Apply over primary dressing as directed. Optifoam Non-Adhesive Dressing, 4x4 in Discharge Instruction: Foam donut Secured With Conforming Stretch Gauze Bandage, Sterile 2x75 (in/in) Discharge Instruction: Secure with stretch gauze as directed. Compression Wrap Compression Stockings Add-Ons Electronic Signature(s) Signed: 10/30/2021 5:43:30 PM By: [name] Entered By: [name] on 10/30/2021 13:58:26 -------------------------------------------------------------------------------- Vitals Details Patient Name: Date of Service: Yolanda Anchors A. 10/30/2021 1:00 PM Medical Record Number: [id number] Patient Account Number: [id number] Date of Birth/Sex: Treating RN: [birth year] (50 y.o. [name] Primary Care Ilario Dhaliwal: Pablo Lawrence Other Clinician: Referring Harlea Goetzinger: Treating Lyndle Pang/Extender: Drue Second in Treatment: 5 Vital Signs Time Taken: 13:47 Temperature (F): 97.9 Height (in): 67 Pulse (bpm): 93 Weight (lbs): 220 Respiratory Rate (breaths/min): 18 Body Mass Index (BMI): 34.5 Blood Pressure (mmHg): 98/66 Capillary Blood Glucose (mg/dl): 220 Reference Range: 80 - 120 mg / dl Electronic Signature(s) Signed: 10/30/2021 2:25:58 PM By: [name] Entered By: [name] on 10/30/2021 14:25:58
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
## 2021-11-06 ENCOUNTER — Encounter (HOSPITAL_BASED_OUTPATIENT_CLINIC_OR_DEPARTMENT_OTHER): Payer: Medicaid Other | Admitting: Internal Medicine

## 2021-11-09 ENCOUNTER — Ambulatory Visit: Payer: Medicaid Other | Admitting: Cardiology

## 2021-11-13 ENCOUNTER — Other Ambulatory Visit: Payer: Self-pay

## 2021-11-13 ENCOUNTER — Encounter (HOSPITAL_BASED_OUTPATIENT_CLINIC_OR_DEPARTMENT_OTHER): Payer: Medicaid Other | Attending: Internal Medicine | Admitting: Internal Medicine

## 2021-11-13 DIAGNOSIS — E1142 Type 2 diabetes mellitus with diabetic polyneuropathy: Secondary | ICD-10-CM | POA: Diagnosis not present

## 2021-11-13 DIAGNOSIS — E11621 Type 2 diabetes mellitus with foot ulcer: Secondary | ICD-10-CM | POA: Insufficient documentation

## 2021-11-13 DIAGNOSIS — L97512 Non-pressure chronic ulcer of other part of right foot with fat layer exposed: Secondary | ICD-10-CM | POA: Insufficient documentation
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
## 2021-11-13 NOTE — Progress Notes (Signed)
Yolanda [name] ([id number]) Visit Report for 11/13/2021 Arrival Information Details Patient Name: Date of Service: Yolanda [name] 11/13/2021 2:15 PM Medical Record Number: [id number] Patient Account Number: [id number] Date of Birth/Sex: Treating RN: January 17, 1971 (50 y.o. Benjamine Sprague, Briant Cedar Primary Care Leonila Speranza: Pablo Lawrence Other Clinician: Referring Mearl Olver: Treating Jory Tanguma/Extender: Drue Second in Treatment: 7 Visit Information History Since Last Visit Added or deleted any medications: No Patient Arrived: Ambulatory Any new allergies or adverse reactions: No Arrival Time: 14:27 Had a fall or experienced change in No Accompanied By: alone activities of daily living that may affect Transfer Assistance: None risk of falls: Patient Requires Transmission-Based Precautions: No Signs or symptoms of abuse/neglect since No Patient Has Alerts: Yes last visito Patient Alerts: Patient on Blood Thinner Hospitalized since last visit: No Implantable device outside of the clinic No excluding cellular tissue based products placed in the center since last visit: Has Dressing in Place as Prescribed: Yes Has Footwear/Offloading in Place as Yes Prescribed: Right: Surgical Shoe with Pressure Relief Insole Pain Present Now: No Electronic Signature(s) Signed: 11/13/2021 4:51:20 PM By: Levan Hurst RN, BSN Entered By: Levan Hurst on 11/13/2021 14:30:07 -------------------------------------------------------------------------------- Encounter Discharge Information Details Patient Name: Date of Service: Yolanda [name] 11/13/2021 2:15 PM Medical Record Number: [id number] Patient Account Number: [id number] Date of Birth/Sex: Treating RN: 1971-10-25 (50 y.o. Nancy Fetter Primary Care Avyon Herendeen: Pablo Lawrence Other Clinician: Referring Ranetta Armacost: Treating Caffie Sotto/Extender: Drue Second in Treatment: 7 Encounter Discharge  Information Items Post Procedure Vitals Discharge Condition: Stable Temperature (F): 97.8 Ambulatory Status: Ambulatory Pulse (bpm): 99 Discharge Destination: Home Respiratory Rate (breaths/min): 18 Transportation: Private Auto Blood Pressure (mmHg): 108/73 Accompanied By: alone Schedule Follow-up Appointment: Yes Clinical Summary of Care: Patient Declined Electronic Signature(s) Signed: 11/13/2021 4:51:20 PM By: Levan Hurst RN, BSN Entered By: Levan Hurst on 11/13/2021 16:28:41 -------------------------------------------------------------------------------- Lower Extremity Assessment Details Patient Name: Date of Service: Yolanda [name] 11/13/2021 2:15 PM Medical Record Number: [id number] Patient Account Number: [id number] Date of Birth/Sex: Treating RN: 1971/09/20 (50 y.o. Nancy Fetter Primary Care Othello Sgroi: Pablo Lawrence Other Clinician: Referring Kenza Munar: Treating Kathyleen Radice/Extender: Drue Second in Treatment: 7 Edema Assessment Assessed: Shirlyn Goltz: No] [Right: No] Edema: [Left: N] [Right: o] Calf Left: Right: Point of Measurement: 31 cm From Medial Instep 35 cm Ankle Left: Right: Point of Measurement: 9 cm From Medial Instep 22.5 cm Vascular Assessment Pulses: Dorsalis Pedis Palpable: [Right:Yes] Electronic Signature(s) Signed: 11/13/2021 4:51:20 PM By: Levan Hurst RN, BSN Entered By: Levan Hurst on 11/13/2021 14:31:27 -------------------------------------------------------------------------------- Multi Wound Chart Details Patient Name: Date of Service: Yolanda [name] 11/13/2021 2:15 PM Medical Record Number: [id number] Patient Account Number: [id number] Date of Birth/Sex: Treating RN: 02-19-1971 (50 y.o. Nancy Fetter Primary Care Chauntel Windsor: Pablo Lawrence Other Clinician: Referring Timi Reeser: Treating Thelda Gagan/Extender: Drue Second in Treatment: 7 Vital Signs Height(in):  31 Capillary Blood Glucose(mg/dl): 490 Weight(lbs): 220 Pulse(bpm): 99 Body Mass Index(BMI): 34 Blood Pressure(mmHg): 108/73 Temperature(F): 97.8 Respiratory Rate(breaths/min): 18 Photos: [1:No Photos Right, Plantar Foot] [N/A:N/A N/A] Wound Location: [1:Gradually Appeared] [N/A:N/A] Wounding Event: [1:Diabetic Wound/Ulcer of the Lower] [N/A:N/A] Primary Etiology: [1:Extremity Sleep Apnea, Congestive Heart] [N/A:N/A] Comorbid History: [1:Failure, Coronary Artery Disease, Hypertension, Myocardial Infarction, Type II Diabetes, Osteoarthritis, Neuropathy 09/01/2020] [N/A:N/A] Date Acquired: [1:7] [N/A:N/A] Weeks of Treatment: [1:Open] [N/A:N/A] Wound Status: [1:0.4x0.4x0.2] [N/A:N/A] Measurements L x W x D (cm) [1:0.126] [N/A:N/A] A (cm) : rea [1:0.025] [N/A:N/A] Volume (cm) : [1:92.40%] [N/A:N/A] % Reduction in A [1:rea: 94.90%] [N/A:N/A] %  Reduction in Volume: [1:Grade 1] [N/A:N/A] Classification: [1:Medium] [N/A:N/A] Exudate A mount: [1:Serosanguineous] [N/A:N/A] Exudate Type: [1:red, brown] [N/A:N/A] Exudate Color: [1:Distinct, outline attached] [N/A:N/A] Wound Margin: [1:Large (67-100%)] [N/A:N/A] Granulation A mount: [1:Pink] [N/A:N/A] Granulation Quality: [1:Small (1-33%)] [N/A:N/A] Necrotic A mount: [1:Fat Layer (Subcutaneous Tissue): Yes N/A] Exposed Structures: [1:Fascia: No Tendon: No Muscle: No Joint: No Bone: No Medium (34-66%)] [N/A:N/A] Epithelialization: [1:Debridement - Selective/Open Wound N/A] Debridement: Pre-procedure Verification/Time Out 14:38 [N/A:N/A] Taken: [1:Callus] [N/A:N/A] Tissue Debrided: [1:Skin/Epidermis] [N/A:N/A] Level: [1:0.16] [N/A:N/A] Debridement A (sq cm): [1:rea Curette] [N/A:N/A] Instrument: [1:Minimum] [N/A:N/A] Bleeding: [1:Pressure] [N/A:N/A] Hemostasis A chieved: [1:0] [N/A:N/A] Procedural Pain: [1:0] [N/A:N/A] Post Procedural Pain: [1:Procedure was tolerated well] [N/A:N/A] Debridement Treatment Response: [1:0.4x0.4x0.2]  [N/A:N/A] Post Debridement Measurements L x W x D (cm) [1:0.025] [N/A:N/A] Post Debridement Volume: (cm) [1:Debridement] [N/A:N/A] Treatment Notes Electronic Signature(s) Signed: 11/13/2021 4:30:32 PM By: Linton Ham MD Signed: 11/13/2021 4:51:20 PM By: Levan Hurst RN, BSN Entered By: Linton Ham on 11/13/2021 14:42:01 -------------------------------------------------------------------------------- Multi-Disciplinary Care Plan Details Patient Name: Date of Service: Yolanda [name], Yolanda [name] 11/13/2021 2:15 PM Medical Record Number: [id number] Patient Account Number: [id number] Date of Birth/Sex: Treating RN: 03/21/1971 (50 y.o. Nancy Fetter Primary Care Susano Cleckler: Pablo Lawrence Other Clinician: Referring Landree Fernholz: Treating Bralynn Donado/Extender: Drue Second in Treatment: 7 Active Inactive Nutrition Nursing Diagnoses: Impaired glucose control: actual or potential Goals: Patient/caregiver verbalizes understanding of need to maintain therapeutic glucose control per primary care physician Date Initiated: 09/25/2021 Target Resolution Date: 11/27/2021 Goal Status: Active Interventions: Assess HgA1c results as ordered upon admission and as needed Provide education on elevated blood sugars and impact on wound healing Provide education on nutrition Notes: 10/30/21: Glucose control ongoing. Wound/Skin Impairment Nursing Diagnoses: Impaired tissue integrity Goals: Patient/caregiver will verbalize understanding of skin care regimen Date Initiated: 09/25/2021 Target Resolution Date: 11/20/2021 Goal Status: Active Ulcer/skin breakdown will have a volume reduction of 30% by week 4 Date Initiated: 09/25/2021 Target Resolution Date: 11/20/2021 Goal Status: Active Interventions: Assess patient/caregiver ability to obtain necessary supplies Assess patient/caregiver ability to perform ulcer/skin care regimen upon admission and as needed Assess  ulceration(s) every visit Provide education on ulcer and skin care Treatment Activities: Topical wound management initiated : 09/25/2021 Notes: 10/30/21: Wound not yet at 30%, undermining noted Electronic Signature(s) Signed: 11/13/2021 4:51:20 PM By: Levan Hurst RN, BSN Entered By: Levan Hurst on 11/13/2021 14:38:07 -------------------------------------------------------------------------------- Pain Assessment Details Patient Name: Date of Service: Yolanda [name] 11/13/2021 2:15 PM Medical Record Number: [id number] Patient Account Number: [id number] Date of Birth/Sex: Treating RN: 1971-09-11 (50 y.o. Nancy Fetter Primary Care Adreena Willits: Pablo Lawrence Other Clinician: Referring Blia Totman: Treating Stevee Valenta/Extender: Drue Second in Treatment: 7 Active Problems Location of Pain Severity and Description of Pain Patient Has Paino No Site Locations Pain Management and Medication Current Pain Management: Electronic Signature(s) Signed: 11/13/2021 4:51:20 PM By: Levan Hurst RN, BSN Entered By: Levan Hurst on 11/13/2021 14:31:06 -------------------------------------------------------------------------------- Patient/Caregiver Education Details Patient Name: Date of Service: Yolanda [name] 12/13/2022andnbsp2:15 PM Medical Record Number: [id number] Patient Account Number: [id number] Date of Birth/Gender: Treating RN: Sep 30, 1971 (50 y.o. Nancy Fetter Primary Care Physician: Pablo Lawrence Other Clinician: Referring Physician: Treating Physician/Extender: Drue Second in Treatment: 7 Education Assessment Education Provided To: Patient Education Topics Provided Wound/Skin Impairment: Methods: Explain/Verbal Responses: State content correctly Electronic Signature(s) Signed: 11/13/2021 4:51:20 PM By: Levan Hurst RN, BSN Entered By: Levan Hurst on 11/13/2021  14:38:39 -------------------------------------------------------------------------------- Wound Assessment Details Patient Name: Date of Service: [name] 11/13/2021 2:15  PM Medical Record Number: [id number] Patient Account Number: [id number] Date of Birth/Sex: Treating RN: Apr 27, 1971 (50 y.o. Nancy Fetter Primary Care Eiza Canniff: Pablo Lawrence Other Clinician: Referring Tarren Sabree: Treating Elva Breaker/Extender: Drue Second in Treatment: 7 Wound Status Wound Number: 1 Primary Diabetic Wound/Ulcer of the Lower Extremity Etiology: Wound Location: Right, Plantar Foot Wound Open Wounding Event: Gradually Appeared Status: Date Acquired: 09/01/2020 Comorbid Sleep Apnea, Congestive Heart Failure, Coronary Artery Disease, Weeks Of Treatment: 7 History: Hypertension, Myocardial Infarction, Type II Diabetes, Clustered Wound: No Osteoarthritis, Neuropathy Wound Measurements Length: (cm) 0.4 Width: (cm) 0.4 Depth: (cm) 0.2 Area: (cm) 0.126 Volume: (cm) 0.025 % Reduction in Area: 92.4% % Reduction in Volume: 94.9% Epithelialization: Medium (34-66%) Tunneling: No Undermining: No Wound Description Classification: Grade 1 Wound Margin: Distinct, outline attached Exudate Amount: Medium Exudate Type: Serosanguineous Exudate Color: red, brown Foul Odor After Cleansing: No Slough/Fibrino Yes Wound Bed Granulation Amount: Large (67-100%) Exposed Structure Granulation Quality: Pink Fascia Exposed: No Necrotic Amount: Small (1-33%) Fat Layer (Subcutaneous Tissue) Exposed: Yes Necrotic Quality: Adherent Slough Tendon Exposed: No Muscle Exposed: No Joint Exposed: No Bone Exposed: No Treatment Notes Wound #1 (Foot) Wound Laterality: Plantar, Right Cleanser Soap and Water Discharge Instruction: May shower and wash wound with dial antibacterial soap and water prior to dressing change. Peri-Wound Care Topical Primary Dressing KerraCel Ag  Gelling Fiber Dressing, 2x2 in (silver alginate) Discharge Instruction: Apply silver alginate to wound bed as instructed Secondary Dressing Woven Gauze Sponges 2x2 in Discharge Instruction: Apply over primary dressing as directed. Optifoam Non-Adhesive Dressing, 4x4 in Discharge Instruction: Foam donut Secured With Conforming Stretch Gauze Bandage, Sterile 2x75 (in/in) Discharge Instruction: Secure with stretch gauze as directed. Compression Wrap Compression Stockings Add-Ons Electronic Signature(s) Signed: 11/13/2021 4:51:20 PM By: Levan Hurst RN, BSN Entered By: Levan Hurst on 11/13/2021 14:35:10 -------------------------------------------------------------------------------- Vitals Details Patient Name: Date of Service: Yolanda [name] 11/13/2021 2:15 PM Medical Record Number: [id number] Patient Account Number: [id number] Date of Birth/Sex: Treating RN: 06-Apr-1971 (50 y.o. Nancy Fetter Primary Care Ewald Beg: Pablo Lawrence Other Clinician: Referring Lillard Bailon: Treating Samanatha Brammer/Extender: Drue Second in Treatment: 7 Vital Signs Time Taken: 14:27 Temperature (F): 97.8 Height (in): 67 Pulse (bpm): 99 Weight (lbs): 220 Respiratory Rate (breaths/min): 18 Body Mass Index (BMI): 34.5 Blood Pressure (mmHg): 108/73 Capillary Blood Glucose (mg/dl): 490 Reference Range: 80 - 120 mg / dl Notes glucose per pt report Electronic Signature(s) Signed: 11/13/2021 4:51:20 PM By: Levan Hurst RN, BSN Entered By: Levan Hurst on 11/13/2021 14:30:37

## 2021-11-21 ENCOUNTER — Other Ambulatory Visit: Payer: Self-pay | Admitting: Family Medicine

## 2021-11-21 ENCOUNTER — Other Ambulatory Visit: Payer: Self-pay

## 2021-11-21 ENCOUNTER — Ambulatory Visit (HOSPITAL_COMMUNITY)
Admission: RE | Admit: 2021-11-21 | Discharge: 2021-11-21 | Disposition: A | Discharge status: Home / Self Care | Payer: Medicaid Other | Source: Ambulatory Visit | Attending: Family Medicine | Admitting: Family Medicine

## 2021-11-21 ENCOUNTER — Other Ambulatory Visit (HOSPITAL_COMMUNITY): Payer: Self-pay | Admitting: Family Medicine

## 2021-11-21 DIAGNOSIS — R2990 Unspecified symptoms and signs involving the nervous system: Secondary | ICD-10-CM

## 2021-11-22 ENCOUNTER — Other Ambulatory Visit (HOSPITAL_COMMUNITY): Payer: Self-pay | Admitting: Nurse Practitioner

## 2021-11-22 ENCOUNTER — Encounter (HOSPITAL_BASED_OUTPATIENT_CLINIC_OR_DEPARTMENT_OTHER): Payer: Medicaid Other | Admitting: Internal Medicine

## 2021-11-22 DIAGNOSIS — E11621 Type 2 diabetes mellitus with foot ulcer: Secondary | ICD-10-CM | POA: Diagnosis not present

## 2021-11-22 DIAGNOSIS — R2990 Unspecified symptoms and signs involving the nervous system: Secondary | ICD-10-CM

## 2021-11-23 ENCOUNTER — Ambulatory Visit: Payer: Medicaid Other | Admitting: Obstetrics & Gynecology

## 2021-11-27 NOTE — Progress Notes (Signed)
DORNA, MALLET ([id number]) Visit Report for 11/22/2021 Debridement Details Patient Name: Date of Service: RAMONITA, KOENIG 11/22/2021 10:00 A M Medical Record Number: [id number] Patient Account Number: [id number] Date of Birth/Sex: Treating RN: 15-Nov-1971 (50 y.o. Nancy Fetter Primary Care Provider: Pablo Lawrence Other Clinician: Referring Provider: Treating Provider/Extender: Drue Second in Treatment: 8 Debridement Performed for Assessment: Wound #1 Right,Plantar Foot Performed By: Physician Ricard Dillon., MD Debridement Type: Debridement Severity of Tissue Pre Debridement: Fat layer exposed Level of Consciousness (Pre-procedure): Awake and Alert Pre-procedure Verification/Time Out Yes - 10:30 Taken: Start Time: 10:30 Pain Control: Lidocaine T Area Debrided (L x W): otal 0.2 (cm) x 0.2 (cm) = 0.04 (cm) Tissue and other material debrided: Viable, Non-Viable, Callus, Subcutaneous, Skin: Dermis Level: Skin/Subcutaneous Tissue Debridement Description: Excisional Instrument: Curette Bleeding: Minimum Hemostasis Achieved: Pressure End Time: 10:30 Procedural Pain: 0 Post Procedural Pain: 0 Response to Treatment: Procedure was tolerated well Level of Consciousness (Post- Awake and Alert procedure): Post Debridement Measurements of Total Wound Length: (cm) 0.2 Width: (cm) 0.2 Depth: (cm) 0.4 Volume: (cm) 0.013 Character of Wound/Ulcer Post Debridement: Improved Severity of Tissue Post Debridement: Fat layer exposed Post Procedure Diagnosis Same as Pre-procedure Electronic Signature(s) Signed: 11/22/2021 3:43:54 PM By: Linton Ham MD Signed: 11/22/2021 4:57:12 PM By: Levan Hurst RN, BSN Entered By: Linton Ham on 11/22/2021 10:40:25 -------------------------------------------------------------------------------- Physical Exam Details Patient Name: Date of Service: [name] 11/22/2021 10:00 A M Medical Record  Number: [id number] Patient Account Number: [id number] Date of Birth/Sex: Treating RN: 08/07/1971 (50 y.o. Nancy Fetter Primary Care Provider: Pablo Lawrence Other Clinician: Referring Provider: Treating Provider/Extender: Drue Second in Treatment: 8 Constitutional Sitting or standing Blood Pressure is within target range for patient.. Pulse regular and within target range for patient.[name] Respirations regular, non-labored and within target range.. Temperature is normal and within the target range for the patient.[name] Appears in no distress. Notes Wound exam; right fourth metatarsal head. No difference in last time still having to remove thick callus and skin from around the wound margins. After I remove those the surface of the wound looks healthy there is no obvious infection this does not probe to bone. Electronic Signature(s) Signed: 11/22/2021 3:43:54 PM By: Linton Ham MD Entered By: Linton Ham on 11/22/2021 10:42:53 -------------------------------------------------------------------------------- Physician Orders Details Patient Name: Date of Service: [name] 11/22/2021 10:00 A M Medical Record Number: [id number] Patient Account Number: [id number] Date of Birth/Sex: Treating RN: August 22, 1971 (50 y.o. Benjaman Lobe Primary Care Provider: Pablo Lawrence Other Clinician: Referring Provider: Treating Provider/Extender: Drue Second in Treatment: 8 Verbal / Phone Orders: No Diagnosis Coding Follow-up Appointments ppointment in 1 week. - with Dr. Dellia Nims Return A Bathing/ Shower/ Hygiene May shower and wash wound with soap and water. - when changing dressing, do not get dressing wet. Off-Loading Other: - ForeFoot Offloading Shoe Additional Orders / Instructions Follow Nutritious Diet - -Monitor and Control Blood Sugar -High Protein Diet Wound Treatment Wound #1 - Foot Wound Laterality: Plantar,  Right Cleanser: Soap and Water Every Other Day/30 Days Discharge Instructions: May shower and wash wound with dial antibacterial soap and water prior to dressing change. Prim Dressing: PolyMem Silver Non-Adhesive Dressing, 4.25x4.25 in Every Other Day/30 Days ary Discharge Instructions: Apply to wound bed as instructed Secondary Dressing: Woven Gauze Sponges 2x2 in Every Other Day/30 Days Discharge Instructions: Apply over primary dressing as directed. Secondary Dressing: Optifoam Non-Adhesive Dressing, 4x4 in Every Other Day/30 Days Discharge Instructions:  Foam donut Secured With: Conforming Stretch Gauze Bandage, Sterile 2x75 (in/in) Every Other Day/30 Days Discharge Instructions: Secure with stretch gauze as directed. Electronic Signature(s) Signed: 11/22/2021 3:43:54 PM By: Linton Ham MD Signed: 11/27/2021 1:16:02 PM By: Rhae Hammock RN Entered By: Rhae Hammock on 11/22/2021 10:40:50 -------------------------------------------------------------------------------- Problem List Details Patient Name: Date of Service: [name] 11/22/2021 10:00 A M Medical Record Number: [id number] Patient Account Number: [id number] Date of Birth/Sex: Treating RN: 1971-05-17 (50 y.o. Nancy Fetter Primary Care Provider: Pablo Lawrence Other Clinician: Referring Provider: Treating Provider/Extender: Drue Second in Treatment: 8 Active Problems ICD-10 Encounter Code Description Active Date MDM Diagnosis E11.621 Type 2 diabetes mellitus with foot ulcer 09/25/2021 No Yes L97.518 Non-pressure chronic ulcer of other part of right foot with other specified 09/25/2021 No Yes severity E11.42 Type 2 diabetes mellitus with diabetic polyneuropathy 09/25/2021 No Yes Inactive Problems Resolved Problems Electronic Signature(s) Signed: 11/22/2021 3:43:54 PM By: Linton Ham MD Entered By: Linton Ham on 11/22/2021  10:40:05 -------------------------------------------------------------------------------- Progress Note Details Patient Name: Date of Service: [name] 11/22/2021 10:00 A M Medical Record Number: [id number] Patient Account Number: [id number] Date of Birth/Sex: Treating RN: [birth year] (50 y.o. Nancy Fetter Primary Care Provider: Pablo Lawrence Other Clinician: Referring Provider: Treating Provider/Extender: Drue Second in Treatment: 8 Subjective Objective Constitutional Sitting or standing Blood Pressure is within target range for patient.. Pulse regular and within target range for patient.[name] Respirations regular, non-labored and within target range.. Temperature is normal and within the target range for the patient.[name] Appears in no distress. Vitals Time Taken: 10:15 AM, Height: 67 in, Weight: 220 lbs, BMI: 34.5, Temperature: 98.2 F, Pulse: 96 bpm, Respiratory Rate: 17 breaths/min, Blood Pressure: 125/73 mmHg. General Notes: Wound exam; right fourth metatarsal head. No difference in last time still having to remove thick callus and skin from around the wound margins. After I remove those the surface of the wound looks healthy there is no obvious infection this does not probe to bone. Integumentary (Hair, Skin) Wound #1 status is Open. Original cause of wound was Gradually Appeared. The date acquired was: 09/01/2020. The wound has been in treatment 8 weeks. The wound is located on the [place]. The wound measures 0.2cm length x 0.2cm width x 0.4cm depth; 0.031cm^2 area and 0.013cm^3 volume. There is Fat Layer (Subcutaneous Tissue) exposed. There is no tunneling noted, however, there is undermining starting at 12:00 and ending at 12:00 with a maximum distance of 0.7cm. There is a medium amount of serosanguineous drainage noted. The wound margin is distinct with the outline attached to the wound base. There is large (67-100%) pink granulation  within the wound bed. There is a small (1-33%) amount of necrotic tissue within the wound bed including Adherent Slough. Assessment Active Problems ICD-10 Type 2 diabetes mellitus with foot ulcer Non-pressure chronic ulcer of other part of right foot with other specified severity Type 2 diabetes mellitus with diabetic polyneuropathy Procedures Wound #1 Pre-procedure diagnosis of Wound #1 is a Diabetic Wound/Ulcer of the Lower Extremity located on the Right,Plantar Foot .Severity of Tissue Pre Debridement is: Fat layer exposed. There was a Excisional Skin/Subcutaneous Tissue Debridement with a total area of 0.04 sq cm performed by Ricard Dillon., MD. With the following instrument(s): Curette to remove Viable and Non-Viable tissue/material. Material removed includes Callus, Subcutaneous Tissue, and Skin: Dermis after achieving pain control using Lidocaine. No specimens were taken. A time out was conducted at 10:30, prior to the start of  the procedure. A Minimum amount of bleeding was controlled with Pressure. The procedure was tolerated well with a pain level of 0 throughout and a pain level of 0 following the procedure. Post Debridement Measurements: 0.2cm length x 0.2cm width x 0.4cm depth; 0.013cm^3 volume. Character of Wound/Ulcer Post Debridement is improved. Severity of Tissue Post Debridement is: Fat layer exposed. Post procedure Diagnosis Wound #1: Same as Pre-Procedure Plan Follow-up Appointments: Return Appointment in 1 week. - with Dr. Dellia Nims Bathing/ Shower/ Hygiene: May shower and wash wound with soap and water. - when changing dressing, do not get dressing wet. Off-Loading: Other: - ForeFoot Offloading Shoe Additional Orders / Instructions: Follow Nutritious Diet - -Monitor and Control Blood Sugar -High Protein Diet WOUND #1: - Foot Wound Laterality: Plantar, Right Cleanser: Soap and Water Every Other Day/30 Days Discharge Instructions: May shower and wash wound with dial  antibacterial soap and water prior to dressing change. Prim Dressing: PolyMem Silver Non-Adhesive Dressing, 4.25x4.25 in Every Other Day/30 Days ary Discharge Instructions: Apply to wound bed as instructed Secondary Dressing: Woven Gauze Sponges 2x2 in Every Other Day/30 Days Discharge Instructions: Apply over primary dressing as directed. Secondary Dressing: Optifoam Non-Adhesive Dressing, 4x4 in Every Other Day/30 Days Discharge Instructions: Foam donut Secured With: Conforming Stretch Gauze Bandage, Sterile 2x75 (in/in) Every Other Day/30 Days Discharge Instructions: Secure with stretch gauze as directed. 1. I change the primary dressing to polymen Ag 2. T the patient to prepare for a total contact cast perhaps next week or the week after old Electronic Signature(s) Signed: 11/22/2021 3:43:54 PM By: Linton Ham MD Entered By: Linton Ham on 11/22/2021 10:46:26 -------------------------------------------------------------------------------- SuperBill Details Patient Name: Date of Service: [name] 11/22/2021 Medical Record Number: [id number] Patient Account Number: [id number] Date of Birth/Sex: Treating RN: 1971/05/25 (50 y.o. Nancy Fetter Primary Care Provider: Pablo Lawrence Other Clinician: Referring Provider: Treating Provider/Extender: Drue Second in Treatment: 8 Diagnosis Coding ICD-10 Codes Code Description [phone] Type 2 diabetes mellitus with foot ulcer L97.518 Non-pressure chronic ulcer of other part of right foot with other specified severity E11.42 Type 2 diabetes mellitus with diabetic polyneuropathy Facility Procedures CPT4 Code: [id number] Description: [id number] - DEB SUBQ TISSUE 20 SQ CM/< ICD-10 Diagnosis Description L97.518 Non-pressure chronic ulcer of other part of right foot with other specified sev E11.621 Type 2 diabetes mellitus with foot ulcer Modifier: erity Quantity: 1 Physician Procedures : CPT4 Code  Description Modifier [id number] - WC PHYS SUBQ TISS 20 SQ CM ICD-10 Diagnosis Description L97.518 Non-pressure chronic ulcer of other part of right foot with other specified severity E11.621 Type 2 diabetes mellitus with foot ulcer Quantity: 1 Electronic Signature(s) Signed: 11/22/2021 3:43:54 PM By: Linton Ham MD Entered By: Linton Ham on 11/22/2021 10:46:46

## 2021-11-27 NOTE — Progress Notes (Signed)
Yolanda [name], Yolanda [name] ([id number]) Visit Report for 11/22/2021 Arrival Information Details Patient Name: Date of Service: Yolanda [name], Yolanda [name] 11/22/2021 10:00 A M Medical Record Number: [id number] Patient Account Number: [id number] Date of Birth/Sex: Treating RN: 1971/03/23 (50 y.o. [name], [name] Primary Care Khilee Hendricksen: Pablo Lawrence Other Clinician: Referring Suzi Hernan: Treating Kemiya Batdorf/Extender: Drue Second in Treatment: 8 Visit Information History Since Last Visit Added or deleted any medications: No Patient Arrived: Ambulatory Any new allergies or adverse reactions: No Arrival Time: 10:14 Had a fall or experienced change in No Accompanied By: self activities of daily living that may affect Transfer Assistance: None risk of falls: Patient Identification Verified: Yes Signs or symptoms of abuse/neglect since last visito No Secondary Verification Process Completed: Yes Hospitalized since last visit: No Patient Requires Transmission-Based Precautions: No Implantable device outside of the clinic excluding No Patient Has Alerts: Yes cellular tissue based products placed in the center Patient Alerts: Patient on Blood Thinner since last visit: Has Dressing in Place as Prescribed: Yes Pain Present Now: No Electronic Signature(s) Signed: 11/27/2021 1:16:02 PM By: Rhae Hammock RN Entered By: Rhae Hammock on 11/22/2021 10:15:18 -------------------------------------------------------------------------------- Encounter Discharge Information Details Patient Name: Date of Service: Yolanda Anchors A. 11/22/2021 10:00 A M Medical Record Number: [id number] Patient Account Number: [id number] Date of Birth/Sex: Treating RN: 04/01/1971 (50 y.o. [name], [name] Primary Care Laurell Coalson: Pablo Lawrence Other Clinician: Referring Darina Hartwell: Treating Kenyetta Fife/Extender: Drue Second in Treatment: 8 Encounter Discharge Information  Items Post Procedure Vitals Discharge Condition: Stable Temperature (F): 98.7 Ambulatory Status: Ambulatory Pulse (bpm): 74 Discharge Destination: Home Respiratory Rate (breaths/min): 17 Transportation: Private Auto Blood Pressure (mmHg): 124/70 Accompanied By: self Schedule Follow-up Appointment: Yes Clinical Summary of Care: Patient Declined Electronic Signature(s) Signed: 11/27/2021 1:16:02 PM By: Rhae Hammock RN Entered By: Rhae Hammock on 11/22/2021 11:06:03 -------------------------------------------------------------------------------- Lower Extremity Assessment Details Patient Name: Date of Service: Yolanda Anchors A. 11/22/2021 10:00 A M Medical Record Number: [id number] Patient Account Number: [id number] Date of Birth/Sex: Treating RN: 1971/10/11 (50 y.o. [name], [name] Primary Care Nakshatra Klose: Pablo Lawrence Other Clinician: Referring Madine Sarr: Treating Braylan Faul/Extender: Drue Second in Treatment: 8 Edema Assessment Assessed: Shirlyn Goltz: No] Patrice Paradise: Yes] Edema: [Left: N] [Right: o] Calf Left: Right: Point of Measurement: 31 cm From Medial Instep 35 cm Ankle Left: Right: Point of Measurement: 9 cm From Medial Instep 22.5 cm Vascular Assessment Pulses: Dorsalis Pedis Palpable: [Right:Yes] Posterior Tibial Palpable: [Right:Yes] Electronic Signature(s) Signed: 11/27/2021 1:16:02 PM By: Rhae Hammock RN Entered By: Rhae Hammock on 11/22/2021 10:20:57 -------------------------------------------------------------------------------- Multi Wound Chart Details Patient Name: Date of Service: Yolanda Anchors A. 11/22/2021 10:00 A M Medical Record Number: [id number] Patient Account Number: [id number] Date of Birth/Sex: Treating RN: 01/13/1971 (50 y.o. Nancy Fetter Primary Care Mariluz Crespo: Pablo Lawrence Other Clinician: Referring Kyriaki Moder: Treating Fred Hammes/Extender: Drue Second in  Treatment: 8 Vital Signs Height(in): 72 Pulse(bpm): 34 Weight(lbs): 220 Blood Pressure(mmHg): 125/73 Body Mass Index(BMI): 34 Temperature(F): 98.2 Respiratory Rate(breaths/min): 17 Photos: [1:Right, Plantar Foot] [N/A:N/A N/A] Wound Location: [1:Gradually Appeared] [N/A:N/A] Wounding Event: [1:Diabetic Wound/Ulcer of the Lower] [N/A:N/A] Primary Etiology: [1:Extremity Sleep Apnea, Congestive Heart] [N/A:N/A] Comorbid History: [1:Failure, Coronary Artery Disease, Hypertension, Myocardial Infarction, Type II Diabetes, Osteoarthritis, Neuropathy 09/01/2020] [N/A:N/A] Date Acquired: [1:8] [N/A:N/A] Weeks of Treatment: [1:Open] [N/A:N/A] Wound Status: [1:0.2x0.2x0.4] [N/A:N/A] Measurements L x W x D (cm) [1:0.031] [N/A:N/A] A (cm) : rea [1:0.013] [N/A:N/A] Volume (cm) : [1:98.10%] [N/A:N/A] % Reduction in A [1:rea: 97.40%] [N/A:N/A] % Reduction in Volume: [1:12] Starting  Position 1 (o'clock): [1:12] Ending Position 1 (o'clock): [1:0.7] Maximum Distance 1 (cm): [1:Yes] [N/A:N/A] Undermining: [1:Grade 2] [N/A:N/A] Classification: [1:Medium] [N/A:N/A] Exudate A mount: [1:Serosanguineous] [N/A:N/A] Exudate Type: [1:red, brown] [N/A:N/A] Exudate Color: [1:Distinct, outline attached] [N/A:N/A] Wound Margin: [1:Large (67-100%)] [N/A:N/A] Granulation A mount: [1:Pink] [N/A:N/A] Granulation Quality: [1:Small (1-33%)] [N/A:N/A] Necrotic A mount: [1:Fat Layer (Subcutaneous Tissue): Yes N/A] Exposed Structures: [1:Fascia: No Tendon: No Muscle: No Joint: No Bone: No Medium (34-66%)] [N/A:N/A] Epithelialization: [1:Debridement - Excisional] [N/A:N/A] Debridement: Pre-procedure Verification/Time Out 10:30 [N/A:N/A] Taken: [1:Lidocaine] [N/A:N/A] Pain Control: [1:Callus, Subcutaneous] [N/A:N/A] Tissue Debrided: [1:Skin/Subcutaneous Tissue] [N/A:N/A] Level: [1:0.04] [N/A:N/A] Debridement A (sq cm): [1:rea Curette] [N/A:N/A] Instrument: [1:Minimum] [N/A:N/A] Bleeding: [1:Pressure]  [N/A:N/A] Hemostasis A chieved: [1:0] [N/A:N/A] Procedural Pain: [1:0] [N/A:N/A] Post Procedural Pain: [1:Procedure was tolerated well] [N/A:N/A] Debridement Treatment Response: [1:0.2x0.2x0.4] [N/A:N/A] Post Debridement Measurements L x W x D (cm) [1:0.013] [N/A:N/A] Post Debridement Volume: (cm) [1:Debridement] [N/A:N/A] Treatment Notes Electronic Signature(s) Signed: 11/22/2021 3:43:54 PM By: Linton Ham MD Signed: 11/22/2021 4:57:12 PM By: Levan Hurst RN, BSN Entered By: Linton Ham on 11/22/2021 10:40:11 -------------------------------------------------------------------------------- Multi-Disciplinary Care Plan Details Patient Name: Date of Service: Yolanda Anchors A. 11/22/2021 10:00 A M Medical Record Number: [id number] Patient Account Number: [id number] Date of Birth/Sex: Treating RN: March 30, 1971 (50 y.o. [name], [name] Primary Care Terril Amaro: Pablo Lawrence Other Clinician: Referring Bevelyn Arriola: Treating Drayk Humbarger/Extender: Drue Second in Treatment: 8 Active Inactive Nutrition Nursing Diagnoses: Impaired glucose control: actual or potential Goals: Patient/caregiver verbalizes understanding of need to maintain therapeutic glucose control per primary care physician Date Initiated: 09/25/2021 Target Resolution Date: 11/27/2021 Goal Status: Active Interventions: Assess HgA1c results as ordered upon admission and as needed Provide education on elevated blood sugars and impact on wound healing Provide education on nutrition Notes: 10/30/21: Glucose control ongoing. Wound/Skin Impairment Nursing Diagnoses: Impaired tissue integrity Goals: Patient/caregiver will verbalize understanding of skin care regimen Date Initiated: 09/25/2021 Target Resolution Date: 11/20/2021 Goal Status: Active Ulcer/skin breakdown will have a volume reduction of 30% by week 4 Date Initiated: 09/25/2021 Target Resolution Date: 11/20/2021 Goal  Status: Active Interventions: Assess patient/caregiver ability to obtain necessary supplies Assess patient/caregiver ability to perform ulcer/skin care regimen upon admission and as needed Assess ulceration(s) every visit Provide education on ulcer and skin care Treatment Activities: Topical wound management initiated : 09/25/2021 Notes: 10/30/21: Wound not yet at 30%, undermining noted Electronic Signature(s) Signed: 11/27/2021 1:16:02 PM By: Rhae Hammock RN Entered By: Rhae Hammock on 11/22/2021 10:42:35 -------------------------------------------------------------------------------- Pain Assessment Details Patient Name: Date of Service: Yolanda Anchors A. 11/22/2021 10:00 A M Medical Record Number: [id number] Patient Account Number: [id number] Date of Birth/Sex: Treating RN: 27-Oct-1971 (50 y.o. [name], [name] Primary Care Deshannon Seide: Pablo Lawrence Other Clinician: Referring Yukie Bergeron: Treating Yee Gangi/Extender: Drue Second in Treatment: 8 Active Problems Location of Pain Severity and Description of Pain Patient Has Paino No Site Locations Pain Management and Medication Current Pain Management: Electronic Signature(s) Signed: 11/27/2021 1:16:02 PM By: Rhae Hammock RN Entered By: Rhae Hammock on 11/22/2021 10:16:04 -------------------------------------------------------------------------------- Patient/Caregiver Education Details Patient Name: Date of Service: Yolanda [name], [name] 12/22/2022andnbsp10:00 A M Medical Record Number: [id number] Patient Account Number: [id number] Date of Birth/Gender: Treating RN: 09/02/1971 (50 y.o. Benjaman Lobe Primary Care Physician: Pablo Lawrence Other Clinician: Referring Physician: Treating Physician/Extender: Drue Second in Treatment: 8 Education Assessment Education Provided To: Patient Education Topics Provided Elevated Blood Sugar/ Impact  on Healing: Nutrition: Wound/Skin Impairment: Engineer, maintenance) Signed: 11/27/2021 1:16:02 PM By: Rhae Hammock RN Entered By:  Rhae Hammock on 11/22/2021 11:02:55 -------------------------------------------------------------------------------- Wound Assessment Details Patient Name: Date of Service: Yolanda [name], Yolanda [name] 11/22/2021 10:00 A M Medical Record Number: [id number] Patient Account Number: [id number] Date of Birth/Sex: Treating RN: 07/07/1971 (50 y.o. [name], [name] Primary Care Maciel Kegg: Pablo Lawrence Other Clinician: Referring Shann Merrick: Treating Tashianna Broome/Extender: Drue Second in Treatment: 8 Wound Status Wound Number: 1 Primary Diabetic Wound/Ulcer of the Lower Extremity Etiology: Wound Location: Right, Plantar Foot Wound Open Wounding Event: Gradually Appeared Status: Date Acquired: 09/01/2020 Comorbid Sleep Apnea, Congestive Heart Failure, Coronary Artery Disease, Weeks Of Treatment: 8 History: Hypertension, Myocardial Infarction, Type II Diabetes, Clustered Wound: No Osteoarthritis, Neuropathy Photos Wound Measurements Length: (cm) 0.2 Width: (cm) 0.2 Depth: (cm) 0.4 Area: (cm) 0.031 Volume: (cm) 0.013 % Reduction in Area: 98.1% % Reduction in Volume: 97.4% Epithelialization: Medium (34-66%) Tunneling: No Undermining: Yes Starting Position (o'clock): 12 Ending Position (o'clock): 12 Maximum Distance: (cm) 0.7 Wound Description Classification: Grade 2 Wound Margin: Distinct, outline attached Exudate Amount: Medium Exudate Type: Serosanguineous Exudate Color: red, brown Foul Odor After Cleansing: No Slough/Fibrino Yes Wound Bed Granulation Amount: Large (67-100%) Exposed Structure Granulation Quality: Pink Fascia Exposed: No Necrotic Amount: Small (1-33%) Fat Layer (Subcutaneous Tissue) Exposed: Yes Necrotic Quality: Adherent Slough Tendon Exposed: No Muscle Exposed: No Joint Exposed: No Bone  Exposed: No Treatment Notes Wound #1 (Foot) Wound Laterality: Plantar, Right Cleanser Soap and Water Discharge Instruction: May shower and wash wound with dial antibacterial soap and water prior to dressing change. Peri-Wound Care Topical Primary Dressing PolyMem Silver Non-Adhesive Dressing, 4.25x4.25 in Discharge Instruction: Apply to wound bed as instructed Secondary Dressing Woven Gauze Sponges 2x2 in Discharge Instruction: Apply over primary dressing as directed. Optifoam Non-Adhesive Dressing, 4x4 in Discharge Instruction: Foam donut Secured With Conforming Stretch Gauze Bandage, Sterile 2x75 (in/in) Discharge Instruction: Secure with stretch gauze as directed. Compression Wrap Compression Stockings Add-Ons Electronic Signature(s) Signed: 11/27/2021 1:16:02 PM By: Rhae Hammock RN Entered By: Rhae Hammock on 11/22/2021 10:23:54 -------------------------------------------------------------------------------- Vitals Details Patient Name: Date of Service: Yolanda Anchors A. 11/22/2021 10:00 A M Medical Record Number: [id number] Patient Account Number: [id number] Date of Birth/Sex: Treating RN: 1971/05/24 (50 y.o. [name], [name] Primary Care Zailey Audia: Pablo Lawrence Other Clinician: Referring Rachna Schonberger: Treating Makiyla Linch/Extender: Drue Second in Treatment: 8 Vital Signs Time Taken: 10:15 Temperature (F): 98.2 Height (in): 67 Pulse (bpm): 96 Weight (lbs): 220 Respiratory Rate (breaths/min): 17 Body Mass Index (BMI): 34.5 Blood Pressure (mmHg): 125/73 Reference Range: 80 - 120 mg / dl Electronic Signature(s) Signed: 11/27/2021 1:16:02 PM By: Rhae Hammock RN Entered By: Rhae Hammock on 11/22/2021 10:15:56

## 2021-11-29 ENCOUNTER — Encounter (HOSPITAL_BASED_OUTPATIENT_CLINIC_OR_DEPARTMENT_OTHER): Payer: Medicaid Other | Admitting: Internal Medicine

## 2021-12-03 ENCOUNTER — Other Ambulatory Visit: Payer: Self-pay | Admitting: "Endocrinology

## 2021-12-05 ENCOUNTER — Encounter (HOSPITAL_COMMUNITY): Payer: Self-pay

## 2021-12-05 ENCOUNTER — Ambulatory Visit (HOSPITAL_COMMUNITY)
Admission: RE | Admit: 2021-12-05 | Discharge: 2021-12-05 | Disposition: A | Discharge status: Home / Self Care | Payer: Medicaid Other | Source: Ambulatory Visit | Attending: Nurse Practitioner | Admitting: Nurse Practitioner

## 2021-12-05 ENCOUNTER — Other Ambulatory Visit: Payer: Self-pay

## 2021-12-05 DIAGNOSIS — R2990 Unspecified symptoms and signs involving the nervous system: Secondary | ICD-10-CM

## 2021-12-13 ENCOUNTER — Inpatient Hospital Stay (HOSPITAL_COMMUNITY)
Admission: EM | Admit: 2021-12-13 | Discharge: 2021-12-15 | DRG: 638 | Disposition: A | Discharge status: Home / Self Care | Payer: Medicaid Other | Attending: Internal Medicine | Admitting: Internal Medicine

## 2021-12-13 ENCOUNTER — Emergency Department (HOSPITAL_COMMUNITY): Payer: Medicaid Other

## 2021-12-13 ENCOUNTER — Encounter (HOSPITAL_COMMUNITY): Payer: Self-pay | Admitting: *Deleted

## 2021-12-13 ENCOUNTER — Other Ambulatory Visit: Payer: Self-pay

## 2021-12-13 ENCOUNTER — Encounter (HOSPITAL_BASED_OUTPATIENT_CLINIC_OR_DEPARTMENT_OTHER): Payer: Medicaid Other | Admitting: Internal Medicine

## 2021-12-13 DIAGNOSIS — E1169 Type 2 diabetes mellitus with other specified complication: Secondary | ICD-10-CM | POA: Diagnosis not present

## 2021-12-13 DIAGNOSIS — I110 Hypertensive heart disease with heart failure: Secondary | ICD-10-CM | POA: Diagnosis present

## 2021-12-13 DIAGNOSIS — Z9989 Dependence on other enabling machines and devices: Secondary | ICD-10-CM

## 2021-12-13 DIAGNOSIS — Z882 Allergy status to sulfonamides status: Secondary | ICD-10-CM

## 2021-12-13 DIAGNOSIS — G40909 Epilepsy, unspecified, not intractable, without status epilepticus: Secondary | ICD-10-CM | POA: Diagnosis present

## 2021-12-13 DIAGNOSIS — Z8249 Family history of ischemic heart disease and other diseases of the circulatory system: Secondary | ICD-10-CM

## 2021-12-13 DIAGNOSIS — Z79899 Other long term (current) drug therapy: Secondary | ICD-10-CM

## 2021-12-13 DIAGNOSIS — Z6832 Body mass index (BMI) 32.0-32.9, adult: Secondary | ICD-10-CM | POA: Diagnosis not present

## 2021-12-13 DIAGNOSIS — Z20822 Contact with and (suspected) exposure to covid-19: Secondary | ICD-10-CM | POA: Diagnosis present

## 2021-12-13 DIAGNOSIS — Z833 Family history of diabetes mellitus: Secondary | ICD-10-CM | POA: Diagnosis not present

## 2021-12-13 DIAGNOSIS — Z83438 Family history of other disorder of lipoprotein metabolism and other lipidemia: Secondary | ICD-10-CM | POA: Diagnosis not present

## 2021-12-13 DIAGNOSIS — Z7984 Long term (current) use of oral hypoglycemic drugs: Secondary | ICD-10-CM

## 2021-12-13 DIAGNOSIS — Z818 Family history of other mental and behavioral disorders: Secondary | ICD-10-CM | POA: Diagnosis not present

## 2021-12-13 DIAGNOSIS — Z794 Long term (current) use of insulin: Secondary | ICD-10-CM | POA: Diagnosis not present

## 2021-12-13 DIAGNOSIS — Z888 Allergy status to other drugs, medicaments and biological substances status: Secondary | ICD-10-CM

## 2021-12-13 DIAGNOSIS — Z7982 Long term (current) use of aspirin: Secondary | ICD-10-CM | POA: Diagnosis not present

## 2021-12-13 DIAGNOSIS — E785 Hyperlipidemia, unspecified: Secondary | ICD-10-CM | POA: Diagnosis present

## 2021-12-13 DIAGNOSIS — L89890 Pressure ulcer of other site, unstageable: Secondary | ICD-10-CM | POA: Diagnosis present

## 2021-12-13 DIAGNOSIS — E1100 Type 2 diabetes mellitus with hyperosmolarity without nonketotic hyperglycemic-hyperosmolar coma (NKHHC): Principal | ICD-10-CM | POA: Diagnosis present

## 2021-12-13 DIAGNOSIS — E1110 Type 2 diabetes mellitus with ketoacidosis without coma: Secondary | ICD-10-CM | POA: Diagnosis not present

## 2021-12-13 DIAGNOSIS — Z9114 Patient's other noncompliance with medication regimen: Secondary | ICD-10-CM

## 2021-12-13 DIAGNOSIS — F319 Bipolar disorder, unspecified: Secondary | ICD-10-CM | POA: Diagnosis present

## 2021-12-13 DIAGNOSIS — K219 Gastro-esophageal reflux disease without esophagitis: Secondary | ICD-10-CM | POA: Diagnosis present

## 2021-12-13 DIAGNOSIS — I10 Essential (primary) hypertension: Secondary | ICD-10-CM | POA: Diagnosis present

## 2021-12-13 DIAGNOSIS — G4733 Obstructive sleep apnea (adult) (pediatric): Secondary | ICD-10-CM | POA: Diagnosis present

## 2021-12-13 DIAGNOSIS — E860 Dehydration: Secondary | ICD-10-CM | POA: Diagnosis present

## 2021-12-13 DIAGNOSIS — E871 Hypo-osmolality and hyponatremia: Secondary | ICD-10-CM

## 2021-12-13 DIAGNOSIS — E119 Type 2 diabetes mellitus without complications: Secondary | ICD-10-CM

## 2021-12-13 DIAGNOSIS — R739 Hyperglycemia, unspecified: Secondary | ICD-10-CM | POA: Diagnosis not present

## 2021-12-13 DIAGNOSIS — I5022 Chronic systolic (congestive) heart failure: Secondary | ICD-10-CM | POA: Diagnosis present

## 2021-12-13 LAB — URINALYSIS, ROUTINE W REFLEX MICROSCOPIC
Bilirubin Urine: NEGATIVE
Glucose, UA: >=500 mg/dL — AB
Ketones, ur: NEGATIVE mg/dL
Nitrite: NEGATIVE
Protein, ur: NEGATIVE mg/dL
Specific Gravity, Urine: 1.007 (ref 1.005–1.030)
pH: 5 (ref 5.0–8.0)

## 2021-12-13 LAB — BASIC METABOLIC PANEL
Anion gap: 11 (ref 5–15)
Anion gap: 8 (ref 5–15)
BUN: 13 mg/dL (ref 6–20)
BUN: 9 mg/dL (ref 6–20)
CO2: 17 mmol/L — ABNORMAL LOW (ref 22–32)
CO2: 23 mmol/L (ref 22–32)
Calcium: 8.8 mg/dL — ABNORMAL LOW (ref 8.9–10.3)
Calcium: 8.6 mg/dL — ABNORMAL LOW (ref 8.9–10.3)
Chloride: 94 mmol/L — ABNORMAL LOW (ref 98–111)
Chloride: 100 mmol/L (ref 98–111)
Creatinine, Ser: 0.68 mg/dL (ref 0.44–1.00)
Creatinine, Ser: 0.64 mg/dL (ref 0.44–1.00)
GFR, Estimated: >60 mL/min (ref >60)
GFR, Estimated: >60 mL/min (ref >60)
Glucose, Bld: 527 mg/dL (ref 70–99)
Glucose, Bld: 198 mg/dL — ABNORMAL HIGH (ref 70–99)
Potassium: 4.1 mmol/L (ref 3.5–5.1)
Potassium: 3.9 mmol/L (ref 3.5–5.1)
Sodium: 122 mmol/L — ABNORMAL LOW (ref 135–145)
Sodium: 131 mmol/L — ABNORMAL LOW (ref 135–145)

## 2021-12-13 LAB — BETA-HYDROXYBUTYRIC ACID: Beta-Hydroxybutyric Acid: 0.46 mmol/L — ABNORMAL HIGH (ref 0.05–0.27)

## 2021-12-13 LAB — CBC WITH DIFFERENTIAL/PLATELET
Abs Immature Granulocytes: 0.1 10*3/uL — ABNORMAL HIGH (ref 0.00–0.07)
Basophils Absolute: 0.1 10*3/uL (ref 0.0–0.1)
Basophils Relative: 0 %
Eosinophils Absolute: 0 10*3/uL (ref 0.0–0.5)
Eosinophils Relative: 0 %
HCT: 41.1 % (ref 36.0–46.0)
Hemoglobin: 13.9 g/dL (ref 12.0–15.0)
Immature Granulocytes: 1 %
Lymphocytes Relative: 24 %
Lymphs Abs: 3.5 10*3/uL (ref 0.7–4.0)
MCH: 27.4 pg (ref 26.0–34.0)
MCHC: 33.8 g/dL (ref 30.0–36.0)
MCV: 80.9 fL (ref 80.0–100.0)
Monocytes Absolute: 0.9 10*3/uL (ref 0.1–1.0)
Monocytes Relative: 6 %
Neutro Abs: 9.8 10*3/uL — ABNORMAL HIGH (ref 1.7–7.7)
Neutrophils Relative %: 69 %
Platelets: 352 10*3/uL (ref 150–400)
RBC: 5.08 MIL/uL (ref 3.87–5.11)
RDW: 14.1 % (ref 11.5–15.5)
WBC: 14.3 10*3/uL — ABNORMAL HIGH (ref 4.0–10.5)
nRBC: 0 % (ref 0.0–0.2)

## 2021-12-13 LAB — CBG MONITORING, ED
Glucose-Capillary: 507 mg/dL (ref 70–99)
Glucose-Capillary: 486 mg/dL — ABNORMAL HIGH (ref 70–99)
Glucose-Capillary: 297 mg/dL — ABNORMAL HIGH (ref 70–99)
Glucose-Capillary: 382 mg/dL — ABNORMAL HIGH (ref 70–99)
Glucose-Capillary: 224 mg/dL — ABNORMAL HIGH (ref 70–99)
Glucose-Capillary: 254 mg/dL — ABNORMAL HIGH (ref 70–99)
Glucose-Capillary: 202 mg/dL — ABNORMAL HIGH (ref 70–99)
Glucose-Capillary: 204 mg/dL — ABNORMAL HIGH (ref 70–99)
Glucose-Capillary: 165 mg/dL — ABNORMAL HIGH (ref 70–99)
Glucose-Capillary: 188 mg/dL — ABNORMAL HIGH (ref 70–99)

## 2021-12-13 LAB — BLOOD GAS, VENOUS
Acid-base deficit: 1.2 mmol/L (ref 0.0–2.0)
Bicarbonate: 22.4 mmol/L (ref 20.0–28.0)
Drawn by: 6352
FIO2: 21
O2 Saturation: 37.6 %
Patient temperature: 36.6
pCO2, Ven: 34.9 mmHg — ABNORMAL LOW (ref 44.0–60.0)
pH, Ven: 7.425 (ref 7.250–7.430)
pO2, Ven: <31 mmHg — CL (ref 32.0–45.0)

## 2021-12-13 LAB — GLUCOSE, CAPILLARY: Glucose-Capillary: 221 mg/dL — ABNORMAL HIGH (ref 70–99)

## 2021-12-13 LAB — LIPASE, BLOOD: Lipase: 39 U/L (ref 11–51)

## 2021-12-13 LAB — RESP PANEL BY RT-PCR (FLU A&B, COVID) ARPGX2
Influenza A by PCR: NEGATIVE
Influenza B by PCR: NEGATIVE
SARS Coronavirus 2 by RT PCR: NEGATIVE

## 2021-12-13 LAB — POC URINE PREG, ED: Preg Test, Ur: NEGATIVE

## 2021-12-13 MED ORDER — METOPROLOL SUCCINATE ER 50 MG PO TB24
75.0000 mg | ORAL_TABLET | Freq: Every day | ORAL | Status: DC
  Administered 2021-12-13 – 2021-12-15 (×3): 75 mg via ORAL
  Filled 2021-12-13 (×3): qty 1

## 2021-12-13 MED ORDER — ENOXAPARIN SODIUM 40 MG/0.4ML IJ SOSY
40.0000 mg | PREFILLED_SYRINGE | INTRAMUSCULAR | Status: DC
  Administered 2021-12-14 – 2021-12-15 (×2): 40 mg via SUBCUTANEOUS
  Filled 2021-12-13 (×2): qty 0.4

## 2021-12-13 MED ORDER — IBUPROFEN 400 MG PO TABS
600.0000 mg | ORAL_TABLET | Freq: Once | ORAL | Status: AC
  Administered 2021-12-13: 600 mg via ORAL
  Filled 2021-12-13: qty 2

## 2021-12-13 MED ORDER — POTASSIUM CHLORIDE 10 MEQ/100ML IV SOLN
10.0000 meq | INTRAVENOUS | Status: AC
  Administered 2021-12-13 (×2): 10 meq via INTRAVENOUS
  Filled 2021-12-13 (×2): qty 100

## 2021-12-13 MED ORDER — ATORVASTATIN CALCIUM 40 MG PO TABS
40.0000 mg | ORAL_TABLET | Freq: Every day | ORAL | Status: DC
  Administered 2021-12-13 – 2021-12-14 (×2): 40 mg via ORAL
  Filled 2021-12-13 (×2): qty 1

## 2021-12-13 MED ORDER — ASPIRIN EC 81 MG PO TBEC
81.0000 mg | DELAYED_RELEASE_TABLET | Freq: Every day | ORAL | Status: DC
  Administered 2021-12-14 – 2021-12-15 (×2): 81 mg via ORAL
  Filled 2021-12-13 (×2): qty 1

## 2021-12-13 MED ORDER — INSULIN DETEMIR 100 UNIT/ML ~~LOC~~ SOLN
25.0000 [IU] | Freq: Every day | SUBCUTANEOUS | Status: DC
  Administered 2021-12-13: 25 [IU] via SUBCUTANEOUS
  Filled 2021-12-13 (×2): qty 0.25

## 2021-12-13 MED ORDER — DEXTROSE IN LACTATED RINGERS 5 % IV SOLN: INTRAVENOUS | Status: DC

## 2021-12-13 MED ORDER — ACETAMINOPHEN 650 MG RE SUPP: 650.0000 mg | Freq: Four times a day (QID) | RECTAL | Status: DC | PRN

## 2021-12-13 MED ORDER — LACTATED RINGERS IV BOLUS
20.0000 mL/kg | Freq: Once | INTRAVENOUS | Status: AC
  Administered 2021-12-13: 1876 mL via INTRAVENOUS

## 2021-12-13 MED ORDER — ACETAMINOPHEN 325 MG PO TABS
650.0000 mg | ORAL_TABLET | Freq: Four times a day (QID) | ORAL | Status: DC | PRN
  Administered 2021-12-14: 650 mg via ORAL
  Filled 2021-12-13: qty 2

## 2021-12-13 MED ORDER — ONDANSETRON HCL 4 MG/2ML IJ SOLN: 4.0000 mg | Freq: Four times a day (QID) | INTRAMUSCULAR | Status: DC | PRN

## 2021-12-13 MED ORDER — SODIUM CHLORIDE 0.9 % IV SOLN: INTRAVENOUS | Status: AC

## 2021-12-13 MED ORDER — SACUBITRIL-VALSARTAN 49-51 MG PO TABS
1.0000 | ORAL_TABLET | Freq: Two times a day (BID) | ORAL | Status: DC
  Administered 2021-12-13 – 2021-12-15 (×4): 1 via ORAL
  Filled 2021-12-13 (×4): qty 1

## 2021-12-13 MED ORDER — LACTATED RINGERS IV SOLN: INTRAVENOUS | Status: DC

## 2021-12-13 MED ORDER — POLYETHYLENE GLYCOL 3350 17 G PO PACK: 17.0000 g | PACK | Freq: Every day | ORAL | Status: DC | PRN

## 2021-12-13 MED ORDER — INSULIN ASPART 100 UNIT/ML IJ SOLN
0.0000 [IU] | INTRAMUSCULAR | Status: DC
  Administered 2021-12-13 – 2021-12-14 (×2): 5 [IU] via SUBCUTANEOUS
  Administered 2021-12-14: 8 [IU] via SUBCUTANEOUS

## 2021-12-13 MED ORDER — ONDANSETRON HCL 4 MG PO TABS: 4.0000 mg | ORAL_TABLET | Freq: Four times a day (QID) | ORAL | Status: DC | PRN

## 2021-12-13 MED ORDER — DEXTROSE 50 % IV SOLN: 0.0000 mL | INTRAVENOUS | Status: DC | PRN

## 2021-12-13 MED ORDER — ZIPRASIDONE HCL 80 MG PO CAPS
80.0000 mg | ORAL_CAPSULE | Freq: Every evening | ORAL | Status: DC
  Administered 2021-12-14: 80 mg via ORAL
  Filled 2021-12-13 (×3): qty 1

## 2021-12-13 MED ORDER — SPIRONOLACTONE 12.5 MG HALF TABLET
12.5000 mg | ORAL_TABLET | Freq: Every day | ORAL | Status: DC
Start: 2021-12-14 — End: 2021-12-15
  Administered 2021-12-14 – 2021-12-15 (×2): 12.5 mg via ORAL
  Filled 2021-12-13 (×4): qty 1

## 2021-12-13 MED ORDER — INSULIN REGULAR(HUMAN) IN NACL 100-0.9 UT/100ML-% IV SOLN
INTRAVENOUS | Status: DC
  Administered 2021-12-13: 19 [IU]/h via INTRAVENOUS
  Filled 2021-12-13: qty 100

## 2021-12-13 NOTE — ED Triage Notes (Signed)
States she was seen at [facility] and referred  here for treatment of hyperglycemia

## 2021-12-13 NOTE — ED Notes (Signed)
Pt's blood sugar is 224. Nurse notified.
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
## 2021-12-13 NOTE — ED Provider Notes (Signed)
[facility] EMERGENCY DEPARTMENT Provider Note   CSN: [id number] Arrival date & time: 12/13/21  1435     History  Chief Complaint  Patient presents with   Hyperglycemia    Yolanda [name] is a 51 y.o. female.   Hyperglycemia Associated symptoms: abdominal pain, fever, increased thirst, polyuria, shortness of breath and weakness   Associated symptoms: no dysuria     Pt is a 51 y/o female - hx of DM, on insulin and pills - out of meds for 4 days - went to doctor in clinic b/c of cough and SOB - flu exposure - temp of 100.4 several days ago - had CBG > 500 in office and sent here - noted to be tachycardic.  Has diarrhea, no vomiting  Home Medications Prior to Admission medications   Medication Sig Start Date End Date Taking? Authorizing Provider  aspirin EC 81 MG EC tablet Take 1 tablet (81 mg total) by mouth daily. Swallow whole. 09/23/20  Yes Charlynne Cousins, MD  atorvastatin (LIPITOR) 40 MG tablet Take 40 mg by mouth at bedtime. 06/21/20  Yes [provider]  clonazePAM (KLONOPIN) 0.5 MG tablet Take 0.5 mg by mouth 2 (two) times daily as needed for anxiety.  07/04/20  Yes [provider]  HUMALOG 100 UNIT/ML injection INJECT 10-16 UNITS TOTAL (0.1 - 0.16 MLSSUBCUTANEOUSLY INTO THE SKIN 3 TIMES A DAY 03/15/21  Yes [name], Marella Chimes, MD  insulin detemir (LEVEMIR) 100 UNIT/ML FlexPen Inject 80 Units into the skin at bedtime. 06/29/21  Yes Reardon, Juanetta Beets, NP  losartan (COZAAR) 25 MG tablet Take 25 mg by mouth daily.   Yes [provider]  metFORMIN (GLUCOPHAGE) 1000 MG tablet Take 1 tablet (1,000 mg total) by mouth 2 (two) times daily with a meal. 09/27/20  Yes [name], Marella Chimes, MD  methocarbamol (ROBAXIN) 500 MG tablet Take 500 mg by mouth 3 (three) times daily. 02/14/21  Yes [provider]  metoprolol succinate (TOPROL-XL) 50 MG 24 hr tablet Take 1.5 tablets (75 mg total) by mouth daily. Take with or immediately following a meal. 04/02/21  12/13/21 Yes Imogene Burn, PA-C  metroNIDAZOLE (METROGEL VAGINAL) 0.75 % vaginal gel Place 1 Applicatorful vaginally at bedtime. Patient not taking: Reported on 12/13/2021 05/17/21   Estill Dooms, NP  MYRBETRIQ 50 MG TB24 tablet TAKE ONE TABLET (50MG TOTAL) BY MOUTH DAILY AT BEDTIME Patient taking differently: Take 50 mg by mouth at bedtime. 08/20/21  Yes Florian Buff, MD  omeprazole (PRILOSEC) 40 MG capsule Take 40 mg by mouth daily. 08/10/20  Yes [provider]  Oxycodone HCl 10 MG TABS Take 10 mg by mouth every 6 (six) hours as needed for pain. 06/10/20  Yes [provider]  OZEMPIC, 0.25 OR 0.5 MG/DOSE, 2 MG/1.5ML SOPN Inject 0.5 mg into the skin once a week. Sunday 07/04/20  Yes [provider]  PARoxetine (PAXIL) 30 MG tablet Take 60 mg by mouth at bedtime.  06/21/20  Yes [provider]  pregabalin (LYRICA) 300 MG capsule Take 300 mg by mouth every 12 (twelve) hours. 06/03/20  Yes [provider]  sacubitril-valsartan (ENTRESTO) 49-51 MG Take 1 tablet by mouth 2 (two) times daily. 10/24/20  Yes Imogene Burn, PA-C  spironolactone (ALDACTONE) 25 MG tablet Take 0.5 tablets (12.5 mg total) by mouth daily. 09/23/20  Yes Charlynne Cousins, MD  TROKENDI XR 200 MG CP24 Take 200 mg by mouth daily.  07/04/20  Yes [provider]  Boric Acid Vaginal 600 MG SUPP Place 600 mg vaginally at bedtime. X 2 weeks. DO NOT TAKE BY MOUTH. 07/19/21   Roma Schanz, CNM  cetirizine (ZYRTEC) 10 MG tablet Take 10 mg by mouth daily. 09/01/21   [provider]  DULoxetine (CYMBALTA) 30 MG capsule Take 30 mg by mouth daily. Patient not taking: Reported on 12/13/2021 02/14/21   [provider]  glucose blood (ONETOUCH VERIO) test strip Test glucose 5 times a day 11/10/20   Cassandria Anger, MD  metroNIDAZOLE (FLAGYL) 500 MG tablet Take 1 tablet (500 mg total) by mouth 2 (two) times daily. Patient not taking: Reported on 05/14/2021  04/17/21   Estill Dooms, NP  Vitamin D, Ergocalciferol, (DRISDOL) 1.25 MG (50000 UNIT) CAPS capsule Take 1 capsule (50,000 Units total) by mouth every 7 (seven) days. Patient not taking: Reported on 12/13/2021 03/13/21   Cassandria Anger, MD  ziprasidone (GEODON) 80 MG capsule Take 80 mg by mouth every evening. 11/06/21   [provider]      Allergies    Abilify [aripiprazole] and Sulfa antibiotics    Review of Systems   Review of Systems  Constitutional:  Positive for chills and fever.  HENT:  Positive for rhinorrhea.   Eyes:  Negative for discharge and redness.  Respiratory:  Positive for cough and shortness of breath.   Gastrointestinal:  Positive for abdominal pain and diarrhea.  Endocrine: Positive for polydipsia and polyuria.  Genitourinary:  Negative for dysuria.  Musculoskeletal:  Negative for back pain.  Neurological:  Positive for weakness. Negative for numbness and headaches.  Hematological:  Negative for adenopathy.   Physical Exam Updated Vital Signs BP (!) 149/103 (BP Location: Right Arm)    Pulse (!) 125    Temp 98 F (36.7 C) (Oral)    Resp 18    Ht 1.702 m (5' 7" )    Wt 93.4 kg    SpO2 95%    BMI 32.26 kg/m  Physical Exam Constitutional:      General: She is in acute distress.     Appearance: She is ill-appearing. She is not diaphoretic.  HENT:     Head: Normocephalic and atraumatic.     Nose: No congestion or rhinorrhea.     Mouth/Throat:     Mouth: Mucous membranes are dry.  Eyes:     General: No scleral icterus.       Right eye: No discharge.        Left eye: No discharge.     Conjunctiva/sclera: Conjunctivae normal.     Pupils: Pupils are equal, round, and reactive to light.  Cardiovascular:     Rate and Rhythm: Regular rhythm. Tachycardia present.     Heart sounds: No murmur heard. Pulmonary:     Breath sounds: No wheezing, rhonchi or rales.     Comments: tachypneic Abdominal:     General: Abdomen is flat.     Tenderness: There  is abdominal tenderness (mid abd without guarding).  Musculoskeletal:        General: No swelling.     Cervical back: Normal range of motion.     Right lower leg: No edema.     Left lower leg: No edema.  Lymphadenopathy:     Cervical: No cervical adenopathy.  Skin:    Findings: No rash.  Neurological:     General: No focal deficit present.     Mental Status: She is alert.     Comments: Steady gait  Psychiatric:        Mood and Affect: Mood normal.    ED Results / Procedures / Treatments   Labs (all labs ordered are listed, but only abnormal results are displayed) Labs Reviewed  BASIC METABOLIC PANEL - Abnormal; Notable for the following components:      Result Value   Sodium 122 (*)    Chloride 94 (*)    CO2 17 (*)    Glucose, Bld 527 (*)    Calcium 8.8 (*)    All other components within normal limits  BETA-HYDROXYBUTYRIC ACID - Abnormal; Notable for the following components:   Beta-Hydroxybutyric Acid 0.46 (*)    All other components within normal limits  CBC WITH DIFFERENTIAL/PLATELET - Abnormal; Notable for the following components:   WBC 14.3 (*)    Neutro Abs 9.8 (*)    Abs Immature Granulocytes 0.10 (*)    All other components within normal limits  BLOOD GAS, VENOUS - Abnormal; Notable for the following components:   pCO2, Ven 34.9 (*)    pO2, Ven <31.0 (*)    All other components within normal limits  CBG MONITORING, ED - Abnormal; Notable for the following components:   Glucose-Capillary 507 (*)    All other components within normal limits  CBG MONITORING, ED - Abnormal; Notable for the following components:   Glucose-Capillary 486 (*)    All other components within normal limits  CBG MONITORING, ED - Abnormal; Notable for the following components:   Glucose-Capillary 382 (*)    All other components within normal limits  RESP PANEL BY RT-PCR (FLU A&B, COVID) [id number]  BASIC METABOLIC PANEL  BASIC METABOLIC PANEL  BASIC METABOLIC PANEL  BETA-HYDROXYBUTYRIC  ACID  URINALYSIS, ROUTINE W REFLEX MICROSCOPIC  LIPASE, BLOOD  POC URINE PREG, ED    EKG EKG Interpretation  Date/Time:  Thursday December 13 2021 15:32:49 EST Ventricular Rate:  111 PR Interval:  157 QRS Duration: 89 QT Interval:  351 QTC Calculation: 477 R Axis:   -38 Text Interpretation: Sinus tachycardia Left axis deviation Nonspecific T abnormalities, lateral leads since last tracing no significant change Confirmed by Noemi Chapel [phone]) on 12/13/2021 3:55:02 PM  Radiology DG Chest Portable 1 View  Result Date: 12/13/2021 CLINICAL DATA:  Cough, flu EXAM: PORTABLE CHEST 1 VIEW COMPARISON:  09/16/2020 FINDINGS: Low lung volumes. Cardiac and mediastinal contours are within normal limits. No focal pulmonary opacity. No pleural effusion or pneumothorax. No acute osseous abnormality. IMPRESSION: No acute cardiopulmonary process. Electronically Signed   By: Merilyn Baba M.D.   On: 12/13/2021 15:44    Procedures .Critical Care Performed by: Noemi Chapel, MD Authorized by: Noemi Chapel, MD   Critical care provider statement:    Critical care time (minutes):  45   Critical care time was exclusive of:  Separately billable procedures and treating other patients and teaching time   Critical care was necessary to treat or prevent imminent or life-threatening deterioration of the following conditions:  Metabolic crisis and endocrine crisis   Critical care was time spent personally by me on the following activities:  Development of treatment plan with patient or surrogate, discussions with consultants, evaluation of patient's response to treatment, examination of patient, ordering and review of laboratory studies, ordering and review of radiographic studies, ordering and performing treatments and interventions, pulse oximetry, re-evaluation of patient's condition, review of old charts and obtaining history from patient or surrogate   I assumed direction of critical care for this patient from  another provider in  my specialty: no     Care discussed with: admitting provider   Comments:          Medications Ordered in ED Medications  insulin regular, human (MYXREDLIN) 100 units/ 100 mL infusion (12 Units/hr Intravenous Rate/Dose Change 12/13/21 1635)  lactated ringers infusion ( Intravenous New Bag/Given 12/13/21 1633)  dextrose 5 % in lactated ringers infusion (0 mLs Intravenous Hold 12/13/21 1606)  dextrose 50 % solution 0-50 mL (has no administration in time range)  potassium chloride 10 mEq in 100 mL IVPB (10 mEq Intravenous New Bag/Given 12/13/21 1602)  lactated ringers bolus 1,876 mL (1,876 mLs Intravenous New Bag/Given 12/13/21 1537)    ED Course/ Medical Decision Making/ A&P                           Medical Decision Making  This patient presents to the ED for concern of tachycardia / flu / DKA, this involves an extensive number of treatment options, and is a complaint that carries with it a high risk of complications and morbidity.  The differential diagnosis includes flu, infection, UTI, sepsis, non compliant DKA, covid   Co morbidities that complicate the patient evaluation  DM, obesity, hypertension, medical non compiance   Additional history obtained:  Additional history obtained from spouse at bedside, prior office visits External records from outside source obtained and reviewed including known diabetic, seems to follow up regularly.   Lab Tests:  I Ordered, and personally interpreted labs.  The pertinent results include:  covid testing and labs / fluids / ketones.   Imaging Studies ordered:  I ordered imaging studies including CXR - portable  I independently visualized and interpreted imaging which showed no findings of pneumonia or other pulmonary infiltrates or abnormal mediastinum I agree with the radiologist interpretation   Cardiac Monitoring:  The patient was maintained on a cardiac monitor.  I personally viewed and interpreted the cardiac  monitored which showed an underlying rhythm of: sinus tachycardia   Medicines ordered and prescription drug management:  I ordered medication including IV fluids for dehydration and insulin drip for hyperglycemia and diabetic ketoacidosis.  Potassium repletion was given as the potassium is less than 5 Reevaluation of the patient after these medicines showed that the patient improved I have reviewed the patients home medicines and have made adjustments as needed   Test Considered:  CT scan of the chest no pulmonary embolism seem less likely   Critical Interventions:  Insulin drip, IV fluid resuscitation   Consultations Obtained:  I requested consultation with the hospitalist with whom I discussed the findings and treatment plan of the patient at length - they recommend: Admission to the hospital and staying on insulin drip   Problem List / ED Course:  Hyponatremia, this is likely pseudohyponatremia secondary to the diabetes, she was given normal saline infusion and bolus Hyperglycemia, associated with ketones in the serum as well as a CO2 of 17, likely mild diabetic ketoacidosis    Reevaluation:  After the interventions noted above, I reevaluated the patient and found that they have :improved   Social Determinants of Health:  Noncompliant with medication   Dispostion:  After consideration of the diagnostic results and the patients response to treatment, I feel that the patent would benefit from admission to the hospital.          Final Clinical Impression(s) / ED Diagnoses Final diagnoses:  Diabetic ketoacidosis without coma associated with type 2 diabetes mellitus ([place])  Hyponatremia  Dehydration     Noemi Chapel, MD 12/13/21 1715

## 2021-12-14 DIAGNOSIS — I10 Essential (primary) hypertension: Secondary | ICD-10-CM

## 2021-12-14 DIAGNOSIS — E871 Hypo-osmolality and hyponatremia: Secondary | ICD-10-CM

## 2021-12-14 DIAGNOSIS — E860 Dehydration: Secondary | ICD-10-CM

## 2021-12-14 DIAGNOSIS — E1100 Type 2 diabetes mellitus with hyperosmolarity without nonketotic hyperglycemic-hyperosmolar coma (NKHHC): Secondary | ICD-10-CM

## 2021-12-14 LAB — HEMOGLOBIN A1C
Hgb A1c MFr Bld: 10.5 % — ABNORMAL HIGH (ref 4.8–5.6)
Mean Plasma Glucose: 254.65 mg/dL

## 2021-12-14 LAB — BASIC METABOLIC PANEL
Anion gap: 8 (ref 5–15)
BUN: 8 mg/dL (ref 6–20)
CO2: 22 mmol/L (ref 22–32)
Calcium: 8.6 mg/dL — ABNORMAL LOW (ref 8.9–10.3)
Chloride: 104 mmol/L (ref 98–111)
Creatinine, Ser: 0.61 mg/dL (ref 0.44–1.00)
GFR, Estimated: >60 mL/min (ref >60)
Glucose, Bld: 284 mg/dL — ABNORMAL HIGH (ref 70–99)
Potassium: 3.6 mmol/L (ref 3.5–5.1)
Sodium: 134 mmol/L — ABNORMAL LOW (ref 135–145)

## 2021-12-14 LAB — GLUCOSE, CAPILLARY
Glucose-Capillary: 204 mg/dL — ABNORMAL HIGH (ref 70–99)
Glucose-Capillary: 220 mg/dL — ABNORMAL HIGH (ref 70–99)
Glucose-Capillary: 220 mg/dL — ABNORMAL HIGH (ref 70–99)
Glucose-Capillary: 221 mg/dL — ABNORMAL HIGH (ref 70–99)
Glucose-Capillary: 225 mg/dL — ABNORMAL HIGH (ref 70–99)
Glucose-Capillary: 252 mg/dL — ABNORMAL HIGH (ref 70–99)
Glucose-Capillary: 268 mg/dL — ABNORMAL HIGH (ref 70–99)
Glucose-Capillary: 296 mg/dL — ABNORMAL HIGH (ref 70–99)

## 2021-12-14 LAB — CBC
HCT: 39.2 % (ref 36.0–46.0)
Hemoglobin: 12.5 g/dL (ref 12.0–15.0)
MCH: 26.3 pg (ref 26.0–34.0)
MCHC: 31.9 g/dL (ref 30.0–36.0)
MCV: 82.5 fL (ref 80.0–100.0)
Platelets: 280 10*3/uL (ref 150–400)
RBC: 4.75 MIL/uL (ref 3.87–5.11)
RDW: 14.3 % (ref 11.5–15.5)
WBC: 10.8 10*3/uL — ABNORMAL HIGH (ref 4.0–10.5)
nRBC: 0 % (ref 0.0–0.2)

## 2021-12-14 LAB — HIV ANTIBODY (ROUTINE TESTING W REFLEX): HIV Screen 4th Generation wRfx: NONREACTIVE

## 2021-12-14 MED ORDER — PANTOPRAZOLE SODIUM 40 MG PO TBEC
40.0000 mg | DELAYED_RELEASE_TABLET | Freq: Every day | ORAL | Status: DC
  Administered 2021-12-14 – 2021-12-15 (×2): 40 mg via ORAL
  Filled 2021-12-14 (×2): qty 1

## 2021-12-14 MED ORDER — METFORMIN HCL 500 MG PO TABS
1000.0000 mg | ORAL_TABLET | Freq: Two times a day (BID) | ORAL | Status: DC
  Administered 2021-12-14 – 2021-12-15 (×2): 1000 mg via ORAL
  Filled 2021-12-14 (×2): qty 2

## 2021-12-14 MED ORDER — INSULIN ASPART 100 UNIT/ML IJ SOLN
0.0000 [IU] | Freq: Three times a day (TID) | INTRAMUSCULAR | Status: DC
  Administered 2021-12-14: 8 [IU] via SUBCUTANEOUS
  Administered 2021-12-14: 5 [IU] via SUBCUTANEOUS
  Administered 2021-12-15: 8 [IU] via SUBCUTANEOUS
  Administered 2021-12-15: 15 [IU] via SUBCUTANEOUS

## 2021-12-14 MED ORDER — OXYCODONE HCL 5 MG PO TABS: 10.0000 mg | ORAL_TABLET | Freq: Four times a day (QID) | ORAL | Status: DC | PRN

## 2021-12-14 MED ORDER — INSULIN ASPART 100 UNIT/ML IJ SOLN
0.0000 [IU] | Freq: Every day | INTRAMUSCULAR | Status: DC
  Administered 2021-12-14: 2 [IU] via SUBCUTANEOUS

## 2021-12-14 MED ORDER — INSULIN DETEMIR 100 UNIT/ML ~~LOC~~ SOLN
20.0000 [IU] | Freq: Two times a day (BID) | SUBCUTANEOUS | Status: DC
  Filled 2021-12-14 (×3): qty 0.2

## 2021-12-14 MED ORDER — NYSTATIN 100000 UNIT/GM EX POWD
Freq: Three times a day (TID) | CUTANEOUS | Status: DC
  Filled 2021-12-14: qty 15

## 2021-12-14 MED ORDER — INSULIN DETEMIR 100 UNIT/ML ~~LOC~~ SOLN
30.0000 [IU] | Freq: Two times a day (BID) | SUBCUTANEOUS | Status: DC
  Administered 2021-12-14 (×2): 30 [IU] via SUBCUTANEOUS
  Filled 2021-12-14 (×3): qty 0.3

## 2021-12-14 MED ORDER — SEMAGLUTIDE(0.25 OR 0.5MG/DOS) 2 MG/1.5ML ~~LOC~~ SOPN: 0.5000 mg | PEN_INJECTOR | SUBCUTANEOUS | Status: DC

## 2021-12-14 MED ORDER — INSULIN ASPART 100 UNIT/ML IJ SOLN
4.0000 [IU] | Freq: Three times a day (TID) | INTRAMUSCULAR | Status: DC
  Administered 2021-12-14 (×2): 4 [IU] via SUBCUTANEOUS

## 2021-12-14 MED ORDER — INSULIN DETEMIR 100 UNIT/ML ~~LOC~~ SOLN
25.0000 [IU] | Freq: Two times a day (BID) | SUBCUTANEOUS | Status: DC
  Filled 2021-12-14 (×3): qty 0.25

## 2021-12-14 MED ORDER — PAROXETINE HCL 20 MG PO TABS
60.0000 mg | ORAL_TABLET | Freq: Every day | ORAL | Status: DC
  Administered 2021-12-14: 60 mg via ORAL
  Filled 2021-12-14: qty 3

## 2021-12-14 MED ORDER — CLONAZEPAM 0.5 MG PO TABS: 0.5000 mg | ORAL_TABLET | Freq: Two times a day (BID) | ORAL | Status: DC | PRN

## 2021-12-14 MED ORDER — PREGABALIN 75 MG PO CAPS
300.0000 mg | ORAL_CAPSULE | Freq: Two times a day (BID) | ORAL | Status: DC
  Administered 2021-12-14 – 2021-12-15 (×3): 300 mg via ORAL
  Filled 2021-12-14 (×3): qty 4

## 2021-12-14 MED ORDER — LOSARTAN POTASSIUM 50 MG PO TABS
25.0000 mg | ORAL_TABLET | Freq: Every day | ORAL | Status: DC
  Administered 2021-12-14 – 2021-12-15 (×2): 25 mg via ORAL
  Filled 2021-12-14 (×2): qty 1

## 2021-12-14 NOTE — Progress Notes (Signed)
[facility] PROGRESS NOTE    Progress Note  Yolanda [name]  NWG:[id number] DOB: [birth year] DOA: 12/13/2021 PCP: Pablo Lawrence, NP     Brief Narrative:   Yolanda [name] is an 51 y.o. female past medical history of systolic heart failure, diabetes mellitus type 2, bipolar disorder and seizure comes in with a productive cough and difficulty breathing a temperature of 100.3, she relates she was so ill she did not take her insulin.  In the ED was found to be tachycardic with a white count of 14 blood glucose of 450, bicarb of 17 anion gap of 11 chest x-ray with no acute abnormalities.   Assessment/Plan:   Hyperglycemic hyperosmolar nonketotic state in the setting of diabetes mellitus type 2: Due to noncompliant with her medications. Anion gap of 11, bicarb 17. She was started on an insulin drip and IV fluids.  Has now been transitioned to subcutaneous insulin. Her blood glucose still 200 we will increase her long-acting insulin to twice a day. Allow a diet change her sliding scale to moderate she is obese. If she tolerates her diet and her blood glucose is better controlled.  She can be discharged this afternoon.  Dyspnea: Subjective, satting greater 95% on room air not tachycardic not tachypneic, could be due to to her hyperglycemia. She she appears to be euvolemic to mildly dehydrated although physical exam is hard due to her body habitus. Chest x-ray showed no acute findings, has remained afebrile, leukocytosis is improving off antibiotics. SARS-CoV-2 and influenza PCR were negative. No source of infection identified.  Pseudohyponatremia: Corrected to 131, continue normal saline.  Bipolar disorder: Continue current home medication paroxetine, clonazepam, ziprasidone.  Essential hypertension: Resume her Entresto, metoprolol and Aldactone  Pressure ulcer present on admission unstageable RN Pressure Injury Documentation: Pressure Injury 09/16/20 Toe (Comment  which one)  Anterior;Right Unstageable - Full thickness tissue loss in which the base of the injury is covered by slough (yellow, tan, gray, green or brown) and/or eschar (tan, brown or black) in the wound bed. yello (Active)  09/16/20 0237  Location: Toe (Comment  which one)  Location Orientation: Anterior;Right  Staging: Unstageable - Full thickness tissue loss in which the base of the injury is covered by slough (yellow, tan, gray, green or brown) and/or eschar (tan, brown or black) in the wound bed.  Wound Description (Comments): yello sloth persent.  Present on Admission: Yes     Pressure Injury 09/16/20 Toe (Comment  which one) Anterior;Left Unstageable - Full thickness tissue loss in which the base of the injury is covered by slough (yellow, tan, gray, green or brown) and/or eschar (tan, brown or black) in the wound bed. unable (Active)  09/16/20 0237  Location: Toe (Comment  which one)  Location Orientation: Anterior;Left  Staging: Unstageable - Full thickness tissue loss in which the base of the injury is covered by slough (yellow, tan, gray, green or brown) and/or eschar (tan, brown or black) in the wound bed.  Wound Description (Comments): unable to stage due to scab in place  Present on Admission: Yes   Morbid obesity: Estimated body mass index is 32.66 kg/m as calculated from the following:   Height as of this encounter: 5' 7"  (1.702 m).   Weight as of this encounter: 94.6 kg. Counseled  DVT prophylaxis: Lovenox Family Communication:none Status is: Inpatient  Remains inpatient appropriate because: Hyperglycemic hyperosmolar nonketotic state due to noncompliance        Code Status:     Code Status Orders  (  From admission, onward)           Start     Ordered   12/13/21 2316  Full code  Continuous        12/13/21 2315           Code Status History     Date Active Date Inactive Code Status Order ID Comments User Context   09/16/2020 0549 09/22/2020 1739 Full Code  [id number]  Zierle-Ghosh, [place] B, DO ED         IV Access:   Peripheral IV   Procedures and diagnostic studies:   DG Chest Portable 1 View  Result Date: 12/13/2021 CLINICAL DATA:  Cough, flu EXAM: PORTABLE CHEST 1 VIEW COMPARISON:  09/16/2020 FINDINGS: Low lung volumes. Cardiac and mediastinal contours are within normal limits. No focal pulmonary opacity. No pleural effusion or pneumothorax. No acute osseous abnormality. IMPRESSION: No acute cardiopulmonary process. Electronically Signed   By: Merilyn Baba M.D.   On: 12/13/2021 15:44     Medical Consultants:   None.   Subjective:    Yolanda [name] feels a lot better than yesterday tolerating her diet.  Objective:    Vitals:   12/13/21 2230 12/13/21 2323 12/14/21 0009 12/14/21 0401  BP: (!) 145/87 133/77  129/75  Pulse: 98 (!) 104  83  Resp: 19 20  18   Temp: 98.3 F (36.8 C) 98.6 F (37 C)  98.2 F (36.8 C)  TempSrc: Oral Oral  Oral  SpO2: 96% 97% 95%   Weight:  94.6 kg    Height:       SpO2: 95 %   Intake/Output Summary (Last 24 hours) at 12/14/2021 0827 Last data filed at 12/14/2021 0300 Gross per 24 hour  Intake 2385.48 ml  Output --  Net 2385.48 ml   Filed Weights   12/13/21 1509 12/13/21 1636 12/13/21 2323  Weight: 93.8 kg 93.4 kg 94.6 kg    Exam: General exam: In no acute distress. Respiratory system: Good air movement and clear to auscultation. Cardiovascular system: S1 & S2 heard, RRR. No JVD. Gastrointestinal system: Abdomen is nondistended, soft and nontender.  Extremities: No pedal edema. Skin: No rashes, lesions or ulcers Psychiatry: Judgement and insight appear normal. Mood & affect appropriate.    Data Reviewed:    Labs: Basic Metabolic Panel: Recent Labs  Lab 12/13/21 1542 12/13/21 1909 12/14/21 0518  NA 122* 131* 134*  K 4.1 3.9 3.6  CL 94* 100 104  CO2 17* 23 22  GLUCOSE 527* 198* 284*  BUN 13 9 8   CREATININE 0.68 0.64 0.61  CALCIUM 8.8* 8.6* 8.6*   GFR Estimated  Creatinine Clearance: 100.4 mL/min (by C-G formula based on SCr of 0.61 mg/dL). Liver Function Tests: No results for input(s): AST, ALT, ALKPHOS, BILITOT, PROT, ALBUMIN in the last 168 hours. Recent Labs  Lab 12/13/21 1542  LIPASE 39   No results for input(s): AMMONIA in the last 168 hours. Coagulation profile No results for input(s): INR, PROTIME in the last 168 hours. COVID-19 Labs  No results for input(s): DDIMER, FERRITIN, LDH, CRP in the last 72 hours.  Lab Results  Component Value Date   SARSCOV2NAA NEGATIVE 12/13/2021   SARSCOV2NAA Detected (A) 12/14/2020   SARSCOV2NAA NEGATIVE 09/16/2020   [place] NEGATIVE 09/16/2020    CBC: Recent Labs  Lab 12/13/21 1542 12/14/21 0518  WBC 14.3* 10.8*  NEUTROABS 9.8*  --   HGB 13.9 12.5  HCT 41.1 39.2  MCV 80.9 82.5  PLT 352 280  Cardiac Enzymes: No results for input(s): CKTOTAL, CKMB, CKMBINDEX, TROPONINI in the last 168 hours. BNP (last 3 results) No results for input(s): PROBNP in the last 8760 hours. CBG: Recent Labs  Lab 12/13/21 2153 12/13/21 2320 12/14/21 0008 12/14/21 0408 12/14/21 0719  GLUCAP 188* 221* 225* 252* 221*   D-Dimer: No results for input(s): DDIMER in the last 72 hours. Hgb A1c: Recent Labs    12/14/21 0518  HGBA1C 10.5*   Lipid Profile: No results for input(s): CHOL, HDL, LDLCALC, TRIG, CHOLHDL, LDLDIRECT in the last 72 hours. Thyroid function studies: No results for input(s): TSH, T4TOTAL, T3FREE, THYROIDAB in the last 72 hours.  Invalid input(s): FREET3 Anemia work up: No results for input(s): VITAMINB12, FOLATE, FERRITIN, TIBC, IRON, RETICCTPCT in the last 72 hours. Sepsis Labs: Recent Labs  Lab 12/13/21 1542 12/14/21 0518  WBC 14.3* 10.8*   Microbiology Recent Results (from the past 240 hour(s))  Resp Panel by RT-PCR (Flu A&B, Covid) Nasopharyngeal Swab     Status: None   Collection Time: 12/13/21  3:41 PM   Specimen: Nasopharyngeal Swab; Nasopharyngeal(NP) swabs in  vial transport medium  Result Value Ref Range Status   SARS Coronavirus 2 by RT PCR NEGATIVE NEGATIVE Final    Comment: (NOTE) SARS-CoV-2 target nucleic acids are NOT DETECTED.  The SARS-CoV-2 RNA is generally detectable in upper respiratory specimens during the acute phase of infection. The lowest concentration of SARS-CoV-2 viral copies this assay can detect is 138 copies/mL. A negative result does not preclude SARS-Cov-2 infection and should not be used as the sole basis for treatment or other patient management decisions. A negative result may occur with  improper specimen collection/handling, submission of specimen other than nasopharyngeal swab, presence of viral mutation(s) within the areas targeted by this assay, and inadequate number of viral copies(<138 copies/mL). A negative result must be combined with clinical observations, patient history, and epidemiological information. The expected result is Negative.  Fact Sheet for Patients:  EntrepreneurPulse.com.au  Fact Sheet for Healthcare Providers:  IncredibleEmployment.be  This test is no t yet approved or cleared by the [place] FDA and  has been authorized for detection and/or diagnosis of SARS-CoV-2 by FDA under an Emergency Use Authorization (EUA). This EUA will remain  in effect (meaning this test can be used) for the duration of the COVID-19 declaration under Section 564(b)(1) of the Act, 21 U.S.C.section 360bbb-3(b)(1), unless the authorization is terminated  or revoked sooner.       Influenza A by PCR NEGATIVE NEGATIVE Final   Influenza B by PCR NEGATIVE NEGATIVE Final    Comment: (NOTE) The Xpert Xpress SARS-CoV-2/FLU/RSV plus assay is intended as an aid in the diagnosis of influenza from Nasopharyngeal swab specimens and should not be used as a sole basis for treatment. Nasal washings and aspirates are unacceptable for Xpert Xpress SARS-CoV-2/FLU/RSV testing.  Fact  Sheet for Patients: EntrepreneurPulse.com.au  Fact Sheet for Healthcare Providers: IncredibleEmployment.be  This test is not yet approved or cleared by the [place] FDA and has been authorized for detection and/or diagnosis of SARS-CoV-2 by FDA under an Emergency Use Authorization (EUA). This EUA will remain in effect (meaning this test can be used) for the duration of the COVID-19 declaration under Section 564(b)(1) of the Act, 21 U.S.C. section 360bbb-3(b)(1), unless the authorization is terminated or revoked.  Performed at [facility], [address] [place], [place] [address]      Medications:    aspirin EC  81 mg Oral Daily   atorvastatin  40 mg Oral QHS   enoxaparin (LOVENOX) injection  40 mg Subcutaneous Q24H   insulin aspart  0-15 Units Subcutaneous Q4H   insulin detemir  25 Units Subcutaneous QHS   metoprolol succinate  75 mg Oral Daily   sacubitril-valsartan  1 tablet Oral BID   spironolactone  12.5 mg Oral Daily   ziprasidone  80 mg Oral QPM   Continuous Infusions:  sodium chloride 100 mL/hr at 12/13/21 2322      LOS: 1 day   Charlynne Cousins  [facility]  12/14/2021, 8:27 AM

## 2021-12-14 NOTE — Progress Notes (Addendum)
Inpatient Diabetes Program Recommendations  AACE/ADA: New Consensus Statement on Inpatient Glycemic Control (2015)  Target Ranges:  Prepandial:   less than 140 mg/dL      Peak postprandial:   less than 180 mg/dL (1-2 hours)      Critically ill patients:  140 - 180 mg/dL   Lab Results  Component Value Date   GLUCAP 221 (H) 12/14/2021   HGBA1C 10.5 (H) 12/14/2021    Review of Glycemic Control  Latest Reference Range & Units 12/13/21 19:54 12/13/21 21:01 12/13/21 21:53 12/13/21 23:20 12/14/21 00:08 12/14/21 04:08 12/14/21 07:19  Glucose-Capillary 70 - 99 mg/dL 204 (H) 165 (H) 188 (H) 221 (H) 225 (H) 252 (H) 221 (H)   Diabetes history: DM 2 Outpatient Diabetes medications:  Levemir 80 units q HS, Humalog 10-16 units tid with meals, Metformin 1000 mg bid, Ozempic 0.5 mg weekly Current orders for Inpatient glycemic control:  Levemir 30 units bid, Novolog 4 units tid with meals, Semaglutide 0.5 weekly, Novolog moderate tid with meals and HS  Inpatient Diabetes Program Recommendations:    Agree with current orders.  A1C indicates poor control of diabetes at home.   Thanks,  Adah Perl, RN, BC-ADM Inpatient Diabetes Coordinator Pager [phone]  (8a-5p)  Addendum:  Spoke with patient by phone and she states that her prescriptions had run out, which was why she could not get her medications.  We discussed current A1C of 10.5% and she states that she thinks her last A1C was 11%.  Briefly discussed importance of taking medications even when sick and mentioning to PCP a "sick day" plan for when she is not eating or unable to eat.  Patient states that when she checks her blood sugars they are usually in the 100 range and she is concerned about current blood sugar of 296 mg/dL.  Explained that patient was not started on her full dose of home medications, but that doses have been increased.  Anticipate that blood sugars will improve as she just got second dose of Levemir at 1010 am .  We also  discussed monitoring.  Patient administers 3-5 shots per day and may benefit from CGM monitoring?  She states that her endocrinologist had mentioned this in the past but her phone was unable to run the program however she has a new phone now.  Encouraged patient to discuss with her PCP and see about getting CGM to assist with monitoring. Pt. Verbalized understanding.

## 2021-12-14 NOTE — Progress Notes (Signed)
Patient admitted for hyperglycemia. BS Q4 has been requiring insulin coverage. No issues with pain. Patient does have dry cough and rhonchi breath sounds noted. Stated she was "exposed to flu."

## 2021-12-14 NOTE — TOC Progression Note (Signed)
°  Transition of Care [facility]) Screening Note   Patient Details  Name: Yolanda [name] Date of Birth: 25-Feb-1971   Transition of Care [facility]) CM/SW Contact:    Shade Flood, LCSW Phone Number: 12/14/2021, 11:25 AM    Transition of Care Department [facility]) has reviewed patient and no TOC needs have been identified at this time. We will continue to monitor patient advancement through interdisciplinary progression rounds. If new patient transition needs arise, please place a TOC consult.

## 2021-12-14 NOTE — Progress Notes (Signed)
Pt setup on cpap auto titrate with medium full face mask plugged in red outlet. Pt tol well

## 2021-12-15 DIAGNOSIS — E1100 Type 2 diabetes mellitus with hyperosmolarity without nonketotic hyperglycemic-hyperosmolar coma (NKHHC): Principal | ICD-10-CM

## 2021-12-15 DIAGNOSIS — E1110 Type 2 diabetes mellitus with ketoacidosis without coma: Secondary | ICD-10-CM

## 2021-12-15 LAB — GLUCOSE, CAPILLARY
Glucose-Capillary: 247 mg/dL — ABNORMAL HIGH (ref 70–99)
Glucose-Capillary: 254 mg/dL — ABNORMAL HIGH (ref 70–99)
Glucose-Capillary: 367 mg/dL — ABNORMAL HIGH (ref 70–99)
Glucose-Capillary: 148 mg/dL — ABNORMAL HIGH (ref 70–99)

## 2021-12-15 MED ORDER — INSULIN DETEMIR 100 UNIT/ML ~~LOC~~ SOLN
50.0000 [IU] | Freq: Two times a day (BID) | SUBCUTANEOUS | Status: DC
  Administered 2021-12-15: 50 [IU] via SUBCUTANEOUS
  Filled 2021-12-15 (×3): qty 0.5

## 2021-12-15 MED ORDER — NYSTATIN 100000 UNIT/GM EX POWD: Freq: Three times a day (TID) | CUTANEOUS | 0 refills | Status: DC

## 2021-12-15 MED ORDER — INSULIN ASPART 100 UNIT/ML IJ SOLN
8.0000 [IU] | Freq: Three times a day (TID) | INTRAMUSCULAR | Status: DC
  Administered 2021-12-15 (×2): 8 [IU] via SUBCUTANEOUS

## 2021-12-15 NOTE — Progress Notes (Signed)
Pt discharged to home. DC instructions given. No concerns voiced. Pt left unit in wheelchair pushed by nurse tech Lattie Haw. Left in stable condition.

## 2021-12-15 NOTE — Discharge Summary (Signed)
Physician Discharge Summary  Yolanda [name] NWG:[id number] DOB: 1971/08/02 DOA: 12/13/2021  PCP: Pablo Lawrence, NP  Admit date: 12/13/2021 Discharge date: 12/15/2021  Admitted From: home Disposition:  home  Recommendations for Outpatient Follow-up:  Follow up with PCP in 1-2 weeks Please obtain BMP/CBC in one week   Home Health:No Equipment/Devices:none  Discharge Condition:Stable CODE STATUS:Full Diet recommendation: Heart Healthy  Brief/Interim Summary: 51 y.o. female past medical history of systolic heart failure, diabetes mellitus type 2, bipolar disorder and seizure comes in with a productive cough and difficulty breathing a temperature of 100.3, she relates she was so ill she did not take her insulin.  In the ED was found to be tachycardic with a white count of 14 blood glucose of 450, bicarb of 17 anion gap of 11 chest x-ray with no acute abnormalities.  Discharge Diagnoses:  Principal Problem:   Hyperosmolar hyperglycemic state (HHS) ([place]) Active Problems:   Essential hypertension, benign   Bipolar 1 disorder (HCC)   Diabetes mellitus ([place])   OSA on CPAP   Seizure disorder (HCC)   Hyperglycemia  Hyperglycemic hyperosmolar nonketotic state in the setting of diabetes mellitus type 2: Likely due to noncompliance with her medication, she relates she stopped taking her medication because she was not feeling well, she was sick for the flu and SARS-CoV-2 which were negative she has no signs of infection she had a mild leukocytosis on admission which resolved with treatment of honk. On admission she was started on IV insulin  then transition to subcutaneous insulin with oral hypoglycemic agents and her blood glucose was improved.  Dyspnea: Subjective she was not tachypneic satting greater 95% on room air not tachycardic question due to hyperglycemia, she apparently mildly dehydrated on physical exam she is started on IV fluid SARS-CoV-2 and influenza PCR were negative no  source of infection were identified. Her leukocytosis resolved she remained afebrile throughout her hospital stay. Leukocytosis likely reactive in the setting of hyperglycemic hyperosmolar nonketotic state.  Pseudohyponatremia: Corrected for her sodium was 131 2 started on normal saline.  Bipolar disorder: No changes made to her medication.  Central hypertension: No changes made to her medication continue current regimen.  Pressure ulcer present on admission unstageable  Discharge Instructions   Allergies as of 12/15/2021       Reactions   Abilify [aripiprazole] Other (See Comments)   Altered mental status    Sulfa Antibiotics Hives, Rash, Other (See Comments)   unknown        Medication List     TAKE these medications    acetaminophen 500 MG tablet Commonly known as: TYLENOL Take 1,500 mg by mouth every 6 (six) hours as needed.   aspirin 81 MG EC tablet Take 1 tablet (81 mg total) by mouth daily. Swallow whole.   atorvastatin 40 MG tablet Commonly known as: LIPITOR Take 40 mg by mouth at bedtime.   Boric Acid Vaginal 600 MG Supp Place 600 mg vaginally at bedtime. X 2 weeks. DO NOT TAKE BY MOUTH.   clonazePAM 0.5 MG tablet Commonly known as: KLONOPIN Take 0.5 mg by mouth 2 (two) times daily as needed for anxiety.   Entresto 49-51 MG Generic drug: sacubitril-valsartan Take 1 tablet by mouth 2 (two) times daily.   HumaLOG 100 UNIT/ML injection Generic drug: insulin lispro INJECT 10-16 UNITS TOTAL (0.1 - 0.16 MLSSUBCUTANEOUSLY INTO THE SKIN 3 TIMES A DAY   insulin detemir 100 UNIT/ML FlexPen Commonly known as: LEVEMIR Inject 80 Units into the skin at bedtime.  What changed:  how much to take when to take this additional instructions   losartan 25 MG tablet Commonly known as: COZAAR Take 25 mg by mouth daily.   metFORMIN 1000 MG tablet Commonly known as: GLUCOPHAGE Take 1 tablet (1,000 mg total) by mouth 2 (two) times daily with a meal.    methocarbamol 500 MG tablet Commonly known as: ROBAXIN Take 500 mg by mouth 3 (three) times daily.   metoprolol succinate 50 MG 24 hr tablet Commonly known as: TOPROL-XL Take 1.5 tablets (75 mg total) by mouth daily. Take with or immediately following a meal.   metroNIDAZOLE 0.75 % vaginal gel Commonly known as: METROGEL VAGINAL Place 1 Applicatorful vaginally at bedtime.   metroNIDAZOLE 500 MG tablet Commonly known as: FLAGYL Take 1 tablet (500 mg total) by mouth 2 (two) times daily.   Myrbetriq 50 MG Tb24 tablet Generic drug: mirabegron ER TAKE ONE TABLET (50MG TOTAL) BY MOUTH DAILY AT BEDTIME What changed: See the new instructions.   nystatin powder Commonly known as: MYCOSTATIN/NYSTOP Apply topically 3 (three) times daily.   omeprazole 40 MG capsule Commonly known as: PRILOSEC Take 40 mg by mouth daily.   OneTouch Verio test strip Generic drug: glucose blood Test glucose 5 times a day   Oxycodone HCl 10 MG Tabs Take 10 mg by mouth every 6 (six) hours as needed for pain.   Ozempic (0.25 or 0.5 MG/DOSE) 2 MG/1.5ML Sopn Generic drug: Semaglutide(0.25 or 0.5MG/DOS) Inject 0.5 mg into the skin once a week. Sunday   PARoxetine 30 MG tablet Commonly known as: PAXIL Take 60 mg by mouth at bedtime.   pregabalin 300 MG capsule Commonly known as: LYRICA Take 300 mg by mouth every 12 (twelve) hours.   spironolactone 25 MG tablet Commonly known as: ALDACTONE Take 0.5 tablets (12.5 mg total) by mouth daily.   Trokendi XR 200 MG Cp24 Generic drug: Topiramate ER Take 200 mg by mouth daily.   Vitamin D (Ergocalciferol) 1.25 MG (50000 UNIT) Caps capsule Commonly known as: DRISDOL Take 1 capsule (50,000 Units total) by mouth every 7 (seven) days.   ziprasidone 80 MG capsule Commonly known as: GEODON Take 80 mg by mouth every evening.        Allergies  Allergen Reactions   Abilify [Aripiprazole] Other (See Comments)    Altered mental status    Sulfa  Antibiotics Hives, Rash and Other (See Comments)    unknown    Consultations: none   Procedures/Studies: MR BRAIN WO CONTRAST  Result Date: 11/21/2021 CLINICAL DATA:  Stroke-like symptoms R29.90 (ICD-10-CM) EXAM: MRI HEAD WITHOUT CONTRAST TECHNIQUE: Multiplanar, multiecho pulse sequences of the brain and surrounding structures were obtained without intravenous contrast. COMPARISON:  CT head Apr 09, 2011. FINDINGS: Brain: No acute infarction, hemorrhage, hydrocephalus, extra-axial collection or mass lesion. Mild atrophy with likely ex vacuo ventricular dilation, not substantially changed from 2012. Mild scattered T2/FLAIR hyperintensities in the white matter, nonspecific but considered within normal limits for patient age. Vascular: Major arterial flow voids are maintained at the skull base. Skull and upper cervical spine: Normal marrow signal. Hyperostosis frontalis. Sinuses/Orbits: Very mild paranasal sinus mucosal thickening. Unremarkable orbits. Other: Fluid in the left petrous apex. Otherwise, trace left mastoid fluid. IMPRESSION: 1. No evidence of acute intracranial abnormality.  No acute infarct. 2. Mild atrophy. 3. Left petrous apex fluid. Electronically Signed   By: Margaretha Sheffield M.D.   On: 11/21/2021 18:16   DG Chest Portable 1 View  Result Date: 12/13/2021 CLINICAL DATA:  Cough, flu EXAM: PORTABLE CHEST 1 VIEW COMPARISON:  09/16/2020 FINDINGS: Low lung volumes. Cardiac and mediastinal contours are within normal limits. No focal pulmonary opacity. No pleural effusion or pneumothorax. No acute osseous abnormality. IMPRESSION: No acute cardiopulmonary process. Electronically Signed   By: Merilyn Baba M.D.   On: 12/13/2021 15:44    Subjective: No complaints  Discharge Exam: Vitals:   12/14/21 2316 12/15/21 0411  BP:  112/75  Pulse: 76 71  Resp: 16 19  Temp:  98.1 F (36.7 C)  SpO2: 93% 96%   Vitals:   12/14/21 1246 12/14/21 2200 12/14/21 2316 12/15/21 0411  BP: (!) 154/97  (!) 156/91  112/75  Pulse: 85 70 76 71  Resp: 17  16 19   Temp: 98.1 F (36.7 C) 98.6 F (37 C)  98.1 F (36.7 C)  TempSrc: Oral Oral    SpO2: 98% 97% 93% 96%  Weight:      Height:        General: Pt is alert, awake, not in acute distress Cardiovascular: RRR, S1/S2 +, no rubs, no gallops Respiratory: CTA bilaterally, no wheezing, no rhonchi Abdominal: Soft, NT, ND, bowel sounds + Extremities: no edema, no cyanosis    The results of significant diagnostics from this hospitalization (including imaging, microbiology, ancillary and laboratory) are listed below for reference.     Microbiology: Recent Results (from the past 240 hour(s))  Resp Panel by RT-PCR (Flu A&B, Covid) Nasopharyngeal Swab     Status: None   Collection Time: 12/13/21  3:41 PM   Specimen: Nasopharyngeal Swab; Nasopharyngeal(NP) swabs in vial transport medium  Result Value Ref Range Status   SARS Coronavirus 2 by RT PCR NEGATIVE NEGATIVE Final    Comment: (NOTE) SARS-CoV-2 target nucleic acids are NOT DETECTED.  The SARS-CoV-2 RNA is generally detectable in upper respiratory specimens during the acute phase of infection. The lowest concentration of SARS-CoV-2 viral copies this assay can detect is 138 copies/mL. A negative result does not preclude SARS-Cov-2 infection and should not be used as the sole basis for treatment or other patient management decisions. A negative result may occur with  improper specimen collection/handling, submission of specimen other than nasopharyngeal swab, presence of viral mutation(s) within the areas targeted by this assay, and inadequate number of viral copies(<138 copies/mL). A negative result must be combined with clinical observations, patient history, and epidemiological information. The expected result is Negative.  Fact Sheet for Patients:  EntrepreneurPulse.com.au  Fact Sheet for Healthcare Providers:   IncredibleEmployment.be  This test is no t yet approved or cleared by the [place] FDA and  has been authorized for detection and/or diagnosis of SARS-CoV-2 by FDA under an Emergency Use Authorization (EUA). This EUA will remain  in effect (meaning this test can be used) for the duration of the COVID-19 declaration under Section 564(b)(1) of the Act, 21 U.S.C.section 360bbb-3(b)(1), unless the authorization is terminated  or revoked sooner.       Influenza A by PCR NEGATIVE NEGATIVE Final   Influenza B by PCR NEGATIVE NEGATIVE Final    Comment: (NOTE) The Xpert Xpress SARS-CoV-2/FLU/RSV plus assay is intended as an aid in the diagnosis of influenza from Nasopharyngeal swab specimens and should not be used as a sole basis for treatment. Nasal washings and aspirates are unacceptable for Xpert Xpress SARS-CoV-2/FLU/RSV testing.  Fact Sheet for Patients: EntrepreneurPulse.com.au  Fact Sheet for Healthcare Providers: IncredibleEmployment.be  This test is not yet approved or cleared by the [place] and has been authorized  for detection and/or diagnosis of SARS-CoV-2 by FDA under an Emergency Use Authorization (EUA). This EUA will remain in effect (meaning this test can be used) for the duration of the COVID-19 declaration under Section 564(b)(1) of the Act, 21 U.S.C. section 360bbb-3(b)(1), unless the authorization is terminated or revoked.  Performed at [facility], [address], [place], [place] [address]      Labs: BNP (last 3 results) No results for input(s): BNP in the last 8760 hours. Basic Metabolic Panel: Recent Labs  Lab 12/13/21 1542 12/13/21 1909 12/14/21 0518  NA 122* 131* 134*  K 4.1 3.9 3.6  CL 94* 100 104  CO2 17* 23 22  GLUCOSE 527* 198* 284*  BUN 13 9 8   CREATININE 0.68 0.64 0.61  CALCIUM 8.8* 8.6* 8.6*   Liver Function Tests: No results for input(s): AST, ALT, ALKPHOS, BILITOT,  PROT, ALBUMIN in the last 168 hours. Recent Labs  Lab 12/13/21 1542  LIPASE 39   No results for input(s): AMMONIA in the last 168 hours. CBC: Recent Labs  Lab 12/13/21 1542 12/14/21 0518  WBC 14.3* 10.8*  NEUTROABS 9.8*  --   HGB 13.9 12.5  HCT 41.1 39.2  MCV 80.9 82.5  PLT 352 280   Cardiac Enzymes: No results for input(s): CKTOTAL, CKMB, CKMBINDEX, TROPONINI in the last 168 hours. BNP: Invalid input(s): POCBNP CBG: Recent Labs  Lab 12/14/21 1618 12/14/21 2023 12/14/21 2232 12/14/21 2357 12/15/21 0412  GLUCAP 204* 220* 220* 268* 247*   D-Dimer No results for input(s): DDIMER in the last 72 hours. Hgb A1c Recent Labs    12/14/21 0518  HGBA1C 10.5*   Lipid Profile No results for input(s): CHOL, HDL, LDLCALC, TRIG, CHOLHDL, LDLDIRECT in the last 72 hours. Thyroid function studies No results for input(s): TSH, T4TOTAL, T3FREE, THYROIDAB in the last 72 hours.  Invalid input(s): FREET3 Anemia work up No results for input(s): VITAMINB12, FOLATE, FERRITIN, TIBC, IRON, RETICCTPCT in the last 72 hours. Urinalysis    Component Value Date/Time   COLORURINE STRAW (A) 12/13/2021 1734   APPEARANCEUR CLEAR 12/13/2021 1734   LABSPEC 1.007 12/13/2021 1734   PHURINE 5.0 12/13/2021 1734   GLUCOSEU >=500 (A) 12/13/2021 1734   HGBUR SMALL (A) 12/13/2021 1734   BILIRUBINUR NEGATIVE 12/13/2021 1734   KETONESUR NEGATIVE 12/13/2021 1734   PROTEINUR NEGATIVE 12/13/2021 1734   UROBILINOGEN 0.2 05/22/2012 1256   NITRITE NEGATIVE 12/13/2021 1734   LEUKOCYTESUR SMALL (A) 12/13/2021 1734   Sepsis Labs Invalid input(s): PROCALCITONIN,  WBC,  LACTICIDVEN Microbiology Recent Results (from the past 240 hour(s))  Resp Panel by RT-PCR (Flu A&B, Covid) Nasopharyngeal Swab     Status: None   Collection Time: 12/13/21  3:41 PM   Specimen: Nasopharyngeal Swab; Nasopharyngeal(NP) swabs in vial transport medium  Result Value Ref Range Status   SARS Coronavirus 2 by RT PCR NEGATIVE  NEGATIVE Final    Comment: (NOTE) SARS-CoV-2 target nucleic acids are NOT DETECTED.  The SARS-CoV-2 RNA is generally detectable in upper respiratory specimens during the acute phase of infection. The lowest concentration of SARS-CoV-2 viral copies this assay can detect is 138 copies/mL. A negative result does not preclude SARS-Cov-2 infection and should not be used as the sole basis for treatment or other patient management decisions. A negative result may occur with  improper specimen collection/handling, submission of specimen other than nasopharyngeal swab, presence of viral mutation(s) within the areas targeted by this assay, and inadequate number of viral copies(<138 copies/mL). A negative result must be combined  with clinical observations, patient history, and epidemiological information. The expected result is Negative.  Fact Sheet for Patients:  EntrepreneurPulse.com.au  Fact Sheet for Healthcare Providers:  IncredibleEmployment.be  This test is no t yet approved or cleared by the [place] FDA and  has been authorized for detection and/or diagnosis of SARS-CoV-2 by FDA under an Emergency Use Authorization (EUA). This EUA will remain  in effect (meaning this test can be used) for the duration of the COVID-19 declaration under Section 564(b)(1) of the Act, 21 U.S.C.section 360bbb-3(b)(1), unless the authorization is terminated  or revoked sooner.       Influenza A by PCR NEGATIVE NEGATIVE Final   Influenza B by PCR NEGATIVE NEGATIVE Final    Comment: (NOTE) The Xpert Xpress SARS-CoV-2/FLU/RSV plus assay is intended as an aid in the diagnosis of influenza from Nasopharyngeal swab specimens and should not be used as a sole basis for treatment. Nasal washings and aspirates are unacceptable for Xpert Xpress SARS-CoV-2/FLU/RSV testing.  Fact Sheet for Patients: EntrepreneurPulse.com.au  Fact Sheet for Healthcare  Providers: IncredibleEmployment.be  This test is not yet approved or cleared by the [place] FDA and has been authorized for detection and/or diagnosis of SARS-CoV-2 by FDA under an Emergency Use Authorization (EUA). This EUA will remain in effect (meaning this test can be used) for the duration of the COVID-19 declaration under Section 564(b)(1) of the Act, 21 U.S.C. section 360bbb-3(b)(1), unless the authorization is terminated or revoked.  Performed at [facility], [address] [place], [place] [address]     SIGNED:   Charlynne Cousins, MD  [facility] 12/15/2021, 7:18 AM Pager   If 7PM-7AM, please contact night-coverage [email or web address] Password [email or web address]

## 2021-12-17 ENCOUNTER — Other Ambulatory Visit: Payer: Self-pay | Admitting: "Endocrinology

## 2021-12-20 ENCOUNTER — Other Ambulatory Visit: Payer: Self-pay

## 2021-12-20 ENCOUNTER — Ambulatory Visit: Payer: Medicaid Other | Admitting: Obstetrics & Gynecology

## 2021-12-20 ENCOUNTER — Ambulatory Visit
Admission: EM | Admit: 2021-12-20 | Discharge: 2021-12-20 | Disposition: A | Discharge status: Home / Self Care | Payer: Medicaid Other | Attending: Family Medicine | Admitting: Family Medicine

## 2021-12-20 ENCOUNTER — Encounter: Payer: Self-pay | Admitting: Emergency Medicine

## 2021-12-20 DIAGNOSIS — R519 Headache, unspecified: Secondary | ICD-10-CM | POA: Diagnosis not present

## 2021-12-20 DIAGNOSIS — J0100 Acute maxillary sinusitis, unspecified: Secondary | ICD-10-CM | POA: Diagnosis not present

## 2021-12-20 DIAGNOSIS — R0981 Nasal congestion: Secondary | ICD-10-CM | POA: Diagnosis not present

## 2021-12-20 DIAGNOSIS — R000 Tachycardia, unspecified: Secondary | ICD-10-CM

## 2021-12-20 MED ORDER — AMOXICILLIN-POT CLAVULANATE 875-125 MG PO TABS: 1.0000 | ORAL_TABLET | Freq: Two times a day (BID) | ORAL | 0 refills | Status: DC

## 2021-12-20 MED ORDER — GUAIFENESIN ER 600 MG PO TB12
600.0000 mg | ORAL_TABLET | Freq: Two times a day (BID) | ORAL | 0 refills | Status: DC
Start: 2021-12-20 — End: 2022-04-11
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
## 2021-12-20 NOTE — ED Triage Notes (Signed)
C/o headache, nasal congestion that is runny down back of throat.  States she was recently hospitalized due to blood sugar.  Negative for covid and flu while in hospital.  Was released on Saturday.  States she had this headache while she was hospitalized.  States she feels nauseated.

## 2021-12-21 LAB — COVID-19, FLU A+B NAA
Influenza A, NAA: NOT DETECTED
Influenza B, NAA: NOT DETECTED
SARS-CoV-2, NAA: NOT DETECTED

## 2021-12-28 ENCOUNTER — Ambulatory Visit (INDEPENDENT_AMBULATORY_CARE_PROVIDER_SITE_OTHER): Payer: Medicaid Other | Admitting: Cardiology

## 2021-12-28 ENCOUNTER — Encounter (HOSPITAL_COMMUNITY): Payer: Self-pay | Admitting: Emergency Medicine

## 2021-12-28 ENCOUNTER — Other Ambulatory Visit: Payer: Self-pay

## 2021-12-28 ENCOUNTER — Encounter: Payer: Self-pay | Admitting: Cardiology

## 2021-12-28 ENCOUNTER — Emergency Department (HOSPITAL_COMMUNITY)
Admission: EM | Admit: 2021-12-28 | Discharge: 2021-12-28 | Disposition: A | Discharge status: Home / Self Care | Payer: Medicaid Other | Attending: Emergency Medicine | Admitting: Emergency Medicine

## 2021-12-28 VITALS — BP 80/50 | HR 100 | Ht 67.0 in | Wt 215.0 lb

## 2021-12-28 DIAGNOSIS — Z79899 Other long term (current) drug therapy: Secondary | ICD-10-CM | POA: Insufficient documentation

## 2021-12-28 DIAGNOSIS — I2510 Atherosclerotic heart disease of native coronary artery without angina pectoris: Secondary | ICD-10-CM

## 2021-12-28 DIAGNOSIS — I5181 Takotsubo syndrome: Secondary | ICD-10-CM | POA: Diagnosis not present

## 2021-12-28 DIAGNOSIS — Z7984 Long term (current) use of oral hypoglycemic drugs: Secondary | ICD-10-CM | POA: Insufficient documentation

## 2021-12-28 DIAGNOSIS — I110 Hypertensive heart disease with heart failure: Secondary | ICD-10-CM | POA: Insufficient documentation

## 2021-12-28 DIAGNOSIS — R197 Diarrhea, unspecified: Secondary | ICD-10-CM | POA: Insufficient documentation

## 2021-12-28 DIAGNOSIS — I509 Heart failure, unspecified: Secondary | ICD-10-CM | POA: Diagnosis not present

## 2021-12-28 DIAGNOSIS — Z794 Long term (current) use of insulin: Secondary | ICD-10-CM | POA: Insufficient documentation

## 2021-12-28 DIAGNOSIS — R1012 Left upper quadrant pain: Secondary | ICD-10-CM | POA: Diagnosis not present

## 2021-12-28 DIAGNOSIS — I959 Hypotension, unspecified: Secondary | ICD-10-CM

## 2021-12-28 DIAGNOSIS — Z7982 Long term (current) use of aspirin: Secondary | ICD-10-CM | POA: Diagnosis not present

## 2021-12-28 DIAGNOSIS — E119 Type 2 diabetes mellitus without complications: Secondary | ICD-10-CM | POA: Diagnosis not present

## 2021-12-28 LAB — COMPREHENSIVE METABOLIC PANEL
ALT: 14 U/L (ref 0–44)
AST: 13 U/L — ABNORMAL LOW (ref 15–41)
Albumin: 2.8 g/dL — ABNORMAL LOW (ref 3.5–5.0)
Alkaline Phosphatase: 79 U/L (ref 38–126)
Anion gap: 7 (ref 5–15)
BUN: 15 mg/dL (ref 6–20)
CO2: 24 mmol/L (ref 22–32)
Calcium: 8.2 mg/dL — ABNORMAL LOW (ref 8.9–10.3)
Chloride: 107 mmol/L (ref 98–111)
Creatinine, Ser: 0.8 mg/dL (ref 0.44–1.00)
GFR, Estimated: >60 mL/min (ref >60)
Glucose, Bld: 284 mg/dL — ABNORMAL HIGH (ref 70–99)
Potassium: 4.3 mmol/L (ref 3.5–5.1)
Sodium: 138 mmol/L (ref 135–145)
Total Bilirubin: 0.2 mg/dL — ABNORMAL LOW (ref 0.3–1.2)
Total Protein: 6.1 g/dL — ABNORMAL LOW (ref 6.5–8.1)

## 2021-12-28 LAB — CBC WITH DIFFERENTIAL/PLATELET
Abs Immature Granulocytes: 0.07 10*3/uL (ref 0.00–0.07)
Basophils Absolute: 0.1 10*3/uL (ref 0.0–0.1)
Basophils Relative: 1 %
Eosinophils Absolute: 0.1 10*3/uL (ref 0.0–0.5)
Eosinophils Relative: 1 %
HCT: 37.8 % (ref 36.0–46.0)
Hemoglobin: 11.5 g/dL — ABNORMAL LOW (ref 12.0–15.0)
Immature Granulocytes: 1 %
Lymphocytes Relative: 26 %
Lymphs Abs: 2 10*3/uL (ref 0.7–4.0)
MCH: 26.5 pg (ref 26.0–34.0)
MCHC: 30.4 g/dL (ref 30.0–36.0)
MCV: 87.1 fL (ref 80.0–100.0)
Monocytes Absolute: 0.5 10*3/uL (ref 0.1–1.0)
Monocytes Relative: 6 %
Neutro Abs: 5 10*3/uL (ref 1.7–7.7)
Neutrophils Relative %: 65 %
Platelets: 297 10*3/uL (ref 150–400)
RBC: 4.34 MIL/uL (ref 3.87–5.11)
RDW: 13.7 % (ref 11.5–15.5)
WBC: 7.8 10*3/uL (ref 4.0–10.5)
nRBC: 0 % (ref 0.0–0.2)

## 2021-12-28 LAB — URINALYSIS, ROUTINE W REFLEX MICROSCOPIC
Bilirubin Urine: NEGATIVE
Glucose, UA: 250 mg/dL — AB
Hgb urine dipstick: NEGATIVE
Ketones, ur: NEGATIVE mg/dL
Nitrite: NEGATIVE
Protein, ur: NEGATIVE mg/dL
Specific Gravity, Urine: 1.015 (ref 1.005–1.030)
pH: 5.5 (ref 5.0–8.0)

## 2021-12-28 LAB — URINALYSIS, MICROSCOPIC (REFLEX)

## 2021-12-28 LAB — LIPASE, BLOOD: Lipase: 26 U/L (ref 11–51)

## 2021-12-28 MED ORDER — SODIUM CHLORIDE 0.9 % IV BOLUS
1000.0000 mL | Freq: Once | INTRAVENOUS | Status: AC
  Administered 2021-12-28: 1000 mL via INTRAVENOUS

## 2021-12-28 MED ORDER — SODIUM CHLORIDE 0.9 % IV BOLUS: 500.0000 mL | Freq: Once | INTRAVENOUS | Status: DC

## 2021-12-28 NOTE — ED Provider Notes (Signed)
[facility] EMERGENCY DEPARTMENT Provider Note   CSN: [id number] Arrival date & time: 12/28/21  1130     History  Chief Complaint  Patient presents with   Hypotension    Yolanda [name] is a 51 y.o. female with PMHx HTN, Dyslipidemia, Diabetes with recent admission for DKA, GERD, CHF/Takutsubo Cardiomyopathy with normal EF who presents to the ED today from Dr. Nelly Laurence office for hypotension. Pt reports she had follow up visit with Dr. Harl Bowie today however was noted to be hypotensive prompting ED visit.   Per chart review: Blood pressure 80/50 at office today.   Pt mentions she has had mild amount of diarrhea that began yesterday. She endorses 3 BMs yesterday. Otherwise she has been feeling well and has not additional complaints. Denies nausea, vomiting, abdominal pain.   Pt was seen at [facility] ED on 1/23 after she was found to be hypotensive at PCP visit the same day. Workup reassuring at that time. She had a CT A/P done with findings of constipation. She was provided with an enema with successful BM in the ED. It does appear she was complaining of urinary retention at that time - found to have only 45 CC on bladder scan. Foley catheter was offered however pt declined. She reports since then she has been doing well at home and urinating normally.   Pt recently finished Augmentin Rx for sinus infection yesterday.   The history is provided by the patient and medical records.      Home Medications Prior to Admission medications   Medication Sig Start Date End Date Taking? Authorizing Provider  metroNIDAZOLE (METROGEL VAGINAL) 0.75 % vaginal gel Place 1 Applicatorful vaginally at bedtime. Patient not taking: Reported on 12/13/2021 05/17/21   Estill Dooms, NP  acetaminophen (TYLENOL) 500 MG tablet Take 1,500 mg by mouth every 6 (six) hours as needed.    [provider]  aspirin EC 81 MG EC tablet Take 1 tablet (81 mg total) by mouth daily. Swallow whole. 09/23/20   Charlynne Cousins, MD  atorvastatin (LIPITOR) 40 MG tablet Take 40 mg by mouth at bedtime. 06/21/20   [provider]  Boric Acid Vaginal 600 MG SUPP Place 600 mg vaginally at bedtime. X 2 weeks. DO NOT TAKE BY MOUTH. 07/19/21   Roma Schanz, CNM  clonazePAM (KLONOPIN) 0.5 MG tablet Take 0.5 mg by mouth 2 (two) times daily as needed for anxiety.  07/04/20   [provider]  glucose blood (ONETOUCH VERIO) test strip Test glucose 5 times a day 11/10/20   Cassandria Anger, MD  guaiFENesin (MUCINEX) 600 MG 12 hr tablet Take 1 tablet (600 mg total) by mouth 2 (two) times daily. 12/20/21   Volney American, PA-C  HUMALOG 100 UNIT/ML injection INJECT 10-16 UNITS TOTAL (0.1 - 0.16 MLSSUBCUTANEOUSLY INTO THE SKIN 3 TIMES A DAY 03/15/21   Cassandria Anger, MD  insulin detemir (LEVEMIR) 100 UNIT/ML FlexPen Inject 80 Units into the skin at bedtime. Patient taking differently: Inject 50 Units into the skin 2 (two) times daily. Spoke to patient by phone and she states that she was taking Levemir 50 units twice a day 06/29/21   Brita Romp, NP  losartan (COZAAR) 25 MG tablet Take 25 mg by mouth daily.    [provider]  metFORMIN (GLUCOPHAGE) 1000 MG tablet Take 1 tablet (1,000 mg total) by mouth 2 (two) times daily with a meal. 09/27/20   [name], Marella Chimes, MD  methocarbamol (ROBAXIN)  500 MG tablet Take 500 mg by mouth 3 (three) times daily. 02/14/21   [provider]  metoprolol succinate (TOPROL-XL) 50 MG 24 hr tablet Take 1.5 tablets (75 mg total) by mouth daily. Take with or immediately following a meal. 04/02/21 12/28/21  Imogene Burn, PA-C  metroNIDAZOLE (FLAGYL) 500 MG tablet Take 1 tablet (500 mg total) by mouth 2 (two) times daily. Patient not taking: Reported on 05/14/2021 04/17/21   Estill Dooms, NP  MYRBETRIQ 50 MG TB24 tablet TAKE ONE TABLET (50MG TOTAL) BY MOUTH DAILY AT BEDTIME Patient taking differently: Take 50 mg by mouth at  bedtime. 08/20/21   Florian Buff, MD  nystatin (MYCOSTATIN/NYSTOP) powder Apply topically 3 (three) times daily. 12/15/21   Charlynne Cousins, MD  omeprazole (PRILOSEC) 40 MG capsule Take 40 mg by mouth daily. 08/10/20   [provider]  Oxycodone HCl 10 MG TABS Take 10 mg by mouth every 6 (six) hours as needed for pain. 06/10/20   [provider]  OZEMPIC, 0.25 OR 0.5 MG/DOSE, 2 MG/1.5ML SOPN Inject 0.5 mg into the skin once a week. Sunday 07/04/20   [provider]  PARoxetine (PAXIL) 30 MG tablet Take 60 mg by mouth at bedtime.  06/21/20   [provider]  pregabalin (LYRICA) 300 MG capsule Take 300 mg by mouth every 12 (twelve) hours. 06/03/20   [provider]  sacubitril-valsartan (ENTRESTO) 49-51 MG Take 1 tablet by mouth 2 (two) times daily. 10/24/20   Imogene Burn, PA-C  spironolactone (ALDACTONE) 25 MG tablet Take 0.5 tablets (12.5 mg total) by mouth daily. 09/23/20   Charlynne Cousins, MD  TROKENDI XR 200 MG CP24 Take 200 mg by mouth daily.  07/04/20   [provider]  Vitamin D, Ergocalciferol, (DRISDOL) 1.25 MG (50000 UNIT) CAPS capsule Take 1 capsule (50,000 Units total) by mouth every 7 (seven) days. Patient not taking: Reported on 12/13/2021 03/13/21   Cassandria Anger, MD  ziprasidone (GEODON) 80 MG capsule Take 80 mg by mouth every evening. 11/06/21   [provider]      Allergies    Abilify [aripiprazole] and Sulfa antibiotics    Review of Systems   Review of Systems  Constitutional:  Negative for chills and fever.  Gastrointestinal:  Positive for diarrhea. Negative for abdominal pain, nausea and vomiting.  Genitourinary:  Negative for difficulty urinating.  Neurological:  Negative for dizziness and light-headedness.  All other systems reviewed and are negative.  Physical Exam Updated Vital Signs BP 111/77    Pulse (!) 105    Temp 97.9 F (36.6 C) (Oral)    Resp 17    Ht 5' 7"  (1.702 m)    Wt 99.2 kg     SpO2 98%    BMI 34.27 kg/m  Physical Exam Vitals and nursing note reviewed.  Constitutional:      Appearance: She is not ill-appearing.  HENT:     Head: Normocephalic and atraumatic.     Mouth/Throat:     Mouth: Mucous membranes are dry.  Eyes:     Conjunctiva/sclera: Conjunctivae normal.  Cardiovascular:     Rate and Rhythm: Normal rate and regular rhythm.     Pulses: Normal pulses.  Pulmonary:     Effort: Pulmonary effort is normal.     Breath sounds: Normal breath sounds. No wheezing, rhonchi or rales.  Abdominal:     Palpations: Abdomen is soft.     Tenderness: There is abdominal tenderness. There  is no guarding or rebound.     Comments: Very mild LUQ TTP  Musculoskeletal:     Cervical back: Neck supple.  Skin:    General: Skin is warm and dry.  Neurological:     Mental Status: She is alert.    ED Results / Procedures / Treatments   Labs (all labs ordered are listed, but only abnormal results are displayed) Labs Reviewed  CBC WITH DIFFERENTIAL/PLATELET - Abnormal; Notable for the following components:      Result Value   Hemoglobin 11.5 (*)    All other components within normal limits  COMPREHENSIVE METABOLIC PANEL - Abnormal; Notable for the following components:   Glucose, Bld 284 (*)    Calcium 8.2 (*)    Total Protein 6.1 (*)    Albumin 2.8 (*)    AST 13 (*)    Total Bilirubin 0.2 (*)    All other components within normal limits  URINALYSIS, ROUTINE W REFLEX MICROSCOPIC - Abnormal; Notable for the following components:   Glucose, UA 250 (*)    Leukocytes,Ua TRACE (*)    All other components within normal limits  URINALYSIS, MICROSCOPIC (REFLEX) - Abnormal; Notable for the following components:   Bacteria, UA FEW (*)    All other components within normal limits  LIPASE, BLOOD    EKG None  Radiology No results found.  Procedures Procedures    Medications Ordered in ED Medications  sodium chloride 0.9 % bolus 1,000 mL (1,000 mLs Intravenous  Bolus 12/28/21 1155)    ED Course/ Medical Decision Making/ A&P                           Medical Decision Making 50 year old female who presents to the ED today from Dr. Zetta Bills office for hypotension.  Found to be hypotensive at 80/50 at regular cardiology office visit today.  On arrival to the ED patient's blood pressure 91/49.  She took her blood pressure medications this morning including metoprolol and losartan.  Incidentally it does appear patient had similar issues last week when she went to PCPs office.  She was sent to the ED at [facility] for further evaluation however work-up reassuring at that time besides constipation she was treated with enema and discharged home.  Patient admits to diarrhea that began yesterday.  She reports 3 episodes yesterday.  No abdominal pain, nausea, vomiting she has been trying to stay hydrated at home.  My exam she has some very mild left upper quadrant abdominal tenderness palpation without rebound or guarding.  We will start with labs at this time including CBC, CMP, lipase and provide fluid bolus in the ED today she is noted to have dry mucous membranes on exam. Attending physician Dr. Roderic Palau evaluated pt as well and agrees with plan.   Workup overall reassuring. Most recent blood pressure 111/77. Given recent ED visit for same 1 week ago question if she needs to be on less BP meds. Have recommended decreasing metoprolol from 75 mg to 50 mg daily and to keep log of BP. Pt to follow back up with PCP/cardiology for same. She is in agreement with plan and stable for discharge home.   Problems Addressed: Hypotension, unspecified hypotension type: acute illness or injury  Amount and/or Complexity of Data Reviewed Labs: ordered.    Details: CBC without leukocytosis. Hgb 11.5.  CMP with glucose 284. Bicarb 24. No gap. No other electrolyte abnormalities.  Lipase WNL at 26 U/A with trace  leuks and few bacteria however no nitrites and 0-5 WBC per HPF. Not  consistent with UTI          Final Clinical Impression(s) / ED Diagnoses Final diagnoses:  Hypotension, unspecified hypotension type    Rx / DC Orders ED Discharge Orders     None        Discharge Instructions      Please follow up with your PCP and cardiology for further evaluation of your low blood pressure.  It is recommended to you decrease your metoprolol from 75 mg to 50 mg (1 tablet) daily and to keep a log of your blood pressure readings to take with to your next appointment.  Drink plenty of fluids to stay hydrated.   Return to the ED for any new/worsening symptoms including low blood pressure readings at home, dizziness, lightheadedness, passing out.        Eustaquio Maize, PA-C 12/28/21 1534    Milton Ferguson, MD 12/29/21 [phone]

## 2021-12-28 NOTE — ED Triage Notes (Signed)
Pt arrives from Dr. Nelly Laurence office d/t hypotension with systolic in 01'V and diarrhea starting yesterday.

## 2021-12-28 NOTE — Discharge Instructions (Signed)
Please follow up with your PCP and cardiology for further evaluation of your low blood pressure.  It is recommended to you decrease your metoprolol from 75 mg to 50 mg (1 tablet) daily and to keep a log of your blood pressure readings to take with to your next appointment.  Drink plenty of fluids to stay hydrated.   Return to the ED for any new/worsening symptoms including low blood pressure readings at home, dizziness, lightheadedness, passing out.
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
## 2021-12-28 NOTE — ED Notes (Signed)
Informed pt a urine sample is needed, to let me know when she has to use the restroom

## 2022-01-03 ENCOUNTER — Other Ambulatory Visit: Payer: Self-pay

## 2022-01-03 ENCOUNTER — Encounter (HOSPITAL_BASED_OUTPATIENT_CLINIC_OR_DEPARTMENT_OTHER): Payer: Medicaid Other | Attending: Internal Medicine | Admitting: Internal Medicine

## 2022-01-03 DIAGNOSIS — G40909 Epilepsy, unspecified, not intractable, without status epilepticus: Secondary | ICD-10-CM | POA: Insufficient documentation

## 2022-01-03 DIAGNOSIS — E1142 Type 2 diabetes mellitus with diabetic polyneuropathy: Secondary | ICD-10-CM | POA: Diagnosis not present

## 2022-01-03 DIAGNOSIS — I252 Old myocardial infarction: Secondary | ICD-10-CM | POA: Insufficient documentation

## 2022-01-03 DIAGNOSIS — L97518 Non-pressure chronic ulcer of other part of right foot with other specified severity: Secondary | ICD-10-CM | POA: Insufficient documentation

## 2022-01-03 DIAGNOSIS — G4733 Obstructive sleep apnea (adult) (pediatric): Secondary | ICD-10-CM | POA: Diagnosis not present

## 2022-01-03 DIAGNOSIS — E11621 Type 2 diabetes mellitus with foot ulcer: Secondary | ICD-10-CM | POA: Diagnosis not present

## 2022-01-03 NOTE — Progress Notes (Signed)
Yolanda [name] ([id number]) Visit Report for 01/03/2022 Arrival Information Details Patient Name: Date of Service: Yolanda [name], Yolanda [name] 01/03/2022 12:30 PM Medical Record Number: [id number] Patient Account Number: [id number] Date of Birth/Sex: Treating RN: [birth year] (51 y.o. Elam Dutch Primary Care Amro Winebarger: Pablo Lawrence Other Clinician: Referring Aracelie Addis: Treating Danuta Huseman/Extender: Drue Second in Treatment: 14 Visit Information History Since Last Visit Added or deleted any medications: No Patient Arrived: Ambulatory Any new allergies or adverse reactions: No Arrival Time: 12:48 Had a fall or experienced change in No Accompanied By: self activities of daily living that may affect Transfer Assistance: None risk of falls: Patient Identification Verified: Yes Signs or symptoms of abuse/neglect since last visito No Secondary Verification Process Completed: Yes Hospitalized since last visit: Yes Patient Requires Transmission-Based Precautions: No Implantable device outside of the clinic excluding No Patient Has Alerts: Yes cellular tissue based products placed in the center Patient Alerts: Patient on Blood Thinner since last visit: Has Dressing in Place as Prescribed: Yes Pain Present Now: Yes Electronic Signature(s) Signed: 01/03/2022 6:25:59 PM By: Baruch Gouty RN, BSN Entered By: Baruch Gouty on 01/03/2022 12:50:35 -------------------------------------------------------------------------------- Encounter Discharge Information Details Patient Name: Date of Service: Yolanda Anchors A. 01/03/2022 12:30 PM Medical Record Number: [id number] Patient Account Number: [id number] Date of Birth/Sex: Treating RN: Nov 13, 1971 (51 y.o. Elam Dutch Primary Care Cerria Randhawa: Pablo Lawrence Other Clinician: Referring Barnett Elzey: Treating Saren Corkern/Extender: Drue Second in Treatment: 14 Encounter Discharge Information Items  Post Procedure Vitals Discharge Condition: Stable Temperature (F): 98.7 Ambulatory Status: Ambulatory Pulse (bpm): 73 Discharge Destination: Home Respiratory Rate (breaths/min): 18 Transportation: Private Auto Blood Pressure (mmHg): 107/71 Accompanied By: self Schedule Follow-up Appointment: Yes Clinical Summary of Care: Patient Declined Electronic Signature(s) Signed: 01/03/2022 6:25:59 PM By: Baruch Gouty RN, BSN Entered By: Baruch Gouty on 01/03/2022 13:25:34 -------------------------------------------------------------------------------- Lower Extremity Assessment Details Patient Name: Date of Service: Yolanda [name] 01/03/2022 12:30 PM Medical Record Number: [id number] Patient Account Number: [id number] Date of Birth/Sex: Treating RN: 11-27-1971 (51 y.o. Elam Dutch Primary Care Halsey Hammen: Pablo Lawrence Other Clinician: Referring Lashondra Vaquerano: Treating Sherard Sutch/Extender: Drue Second in Treatment: 14 Edema Assessment Assessed: Shirlyn Goltz: No] Patrice Paradise: No] Edema: [Left: N] [Right: o] Calf Left: Right: Point of Measurement: 31 cm From Medial Instep 35 cm Ankle Left: Right: Point of Measurement: 9 cm From Medial Instep 22.5 cm Vascular Assessment Pulses: Dorsalis Pedis Palpable: [Right:No] Electronic Signature(s) Signed: 01/03/2022 6:25:59 PM By: Baruch Gouty RN, BSN Entered By: Baruch Gouty on 01/03/2022 12:56:36 -------------------------------------------------------------------------------- Multi Wound Chart Details Patient Name: Date of Service: Yolanda Anchors A. 01/03/2022 12:30 PM Medical Record Number: [id number] Patient Account Number: [id number] Date of Birth/Sex: Treating RN: 07-Dec-1970 (51 y.o. Elam Dutch Primary Care Gerrell Tabet: Pablo Lawrence Other Clinician: Referring Lekita Kerekes: Treating Caelynn Marshman/Extender: Drue Second in Treatment: 14 Vital Signs Height(in): 67 Capillary Blood  Glucose(mg/dl): 300 Weight(lbs): 220 Pulse(bpm): 123 Body Mass Index(BMI): 34.5 Blood Pressure(mmHg): 101/71 Temperature(F): 98.7 Respiratory Rate(breaths/min): 18 Photos: [1:Right, Plantar Foot] [N/A:N/A N/A] Wound Location: [1:Gradually Appeared] [N/A:N/A] Wounding Event: [1:Diabetic Wound/Ulcer of the Lower] [N/A:N/A] Primary Etiology: [1:Extremity Sleep Apnea, Congestive Heart] [N/A:N/A] Comorbid History: [1:Failure, Coronary Artery Disease, Hypertension, Myocardial Infarction, Type II Diabetes, Osteoarthritis, Neuropathy 09/01/2020] [N/A:N/A] Date Acquired: [1:14] [N/A:N/A] Weeks of Treatment: [1:Open] [N/A:N/A] Wound Status: [1:No] [N/A:N/A] Wound Recurrence: [1:0.3x0.4x0.3] [N/A:N/A] Measurements L x W x D (cm) [1:0.094] [N/A:N/A] A (cm) : rea [1:0.028] [N/A:N/A] Volume (cm) : [1:94.30%] [N/A:N/A] % Reduction in A [1:rea: 94.30%] [N/A:N/A] % Reduction in  Volume: [1:Grade 2] [N/A:N/A] Classification: [1:Medium] [N/A:N/A] Exudate A mount: [1:Serosanguineous] [N/A:N/A] Exudate Type: [1:red, brown] [N/A:N/A] Exudate Color: [1:Thickened] [N/A:N/A] Wound Margin: [1:Large (67-100%)] [N/A:N/A] Granulation A mount: [1:Red, Pink] [N/A:N/A] Granulation Quality: [1:None Present (0%)] [N/A:N/A] Necrotic A mount: [1:Fat Layer (Subcutaneous Tissue): Yes N/A] Exposed Structures: [1:Fascia: No Tendon: No Muscle: No Joint: No Bone: No Small (1-33%)] [N/A:N/A] Epithelialization: [1:Debridement - Excisional] [N/A:N/A] Debridement: Pre-procedure Verification/Time Out 13:05 [N/A:N/A] Taken: [1:Lidocaine 5% topical ointment] [N/A:N/A] Pain Control: [1:Callus, Subcutaneous] [N/A:N/A] Tissue Debrided: [1:Skin/Subcutaneous Tissue] [N/A:N/A] Level: [1:0.16] [N/A:N/A] Debridement A (sq cm): [1:rea Curette] [N/A:N/A] Instrument: [1:Minimum] [N/A:N/A] Bleeding: [1:Pressure] [N/A:N/A] Hemostasis A chieved: [1:2] [N/A:N/A] Procedural Pain: [1:0] [N/A:N/A] Post Procedural Pain: [1:Procedure was  tolerated well] [N/A:N/A] Debridement Treatment Response: [1:0.4x0.4x0.2] [N/A:N/A] Post Debridement Measurements L x W x D (cm) [1:0.025] [N/A:N/A] Post Debridement Volume: (cm) [1:Debridement] [N/A:N/A] Treatment Notes Electronic Signature(s) Signed: 01/03/2022 4:50:25 PM By: Linton Ham MD Signed: 01/03/2022 6:25:59 PM By: Baruch Gouty RN, BSN Entered By: Linton Ham on 01/03/2022 13:18:42 -------------------------------------------------------------------------------- Multi-Disciplinary Care Plan Details Patient Name: Date of Service: IOLANI, TWILLEY 01/03/2022 12:30 PM Medical Record Number: [id number] Patient Account Number: [id number] Date of Birth/Sex: Treating RN: 1971/11/20 (51 y.o. Elam Dutch Primary Care Aking Klabunde: Pablo Lawrence Other Clinician: Referring Armelia Penton: Treating Xzaria Teo/Extender: Drue Second in Treatment: [place] reviewed with physician Active Inactive Nutrition Nursing Diagnoses: Impaired glucose control: actual or potential Goals: Patient/caregiver verbalizes understanding of need to maintain therapeutic glucose control per primary care physician Date Initiated: 09/25/2021 Target Resolution Date: 01/31/2022 Goal Status: Active Interventions: Assess HgA1c results as ordered upon admission and as needed Provide education on elevated blood sugars and impact on wound healing Provide education on nutrition Treatment Activities: Education provided on Nutrition : 11/22/2021 Notes: 10/30/21: Glucose control ongoing. Wound/Skin Impairment Nursing Diagnoses: Impaired tissue integrity Goals: Patient/caregiver will verbalize understanding of skin care regimen Date Initiated: 09/25/2021 T arget Resolution Date: 01/31/2022 Goal Status: Active Ulcer/skin breakdown will have a volume reduction of 30% by week 4 Date Initiated: 09/25/2021 Date Inactivated: 01/03/2022 Target Resolution Date:  11/20/2021 Unmet Reason: uncontrolled diabetes, Goal Status: Unmet hospitalization Interventions: Assess patient/caregiver ability to obtain necessary supplies Assess patient/caregiver ability to perform ulcer/skin care regimen upon admission and as needed Assess ulceration(s) every visit Provide education on ulcer and skin care Treatment Activities: Topical wound management initiated : 09/25/2021 Notes: 10/30/21: Wound not yet at 30%, undermining noted Electronic Signature(s) Signed: 01/03/2022 6:25:59 PM By: Baruch Gouty RN, BSN Entered By: Baruch Gouty on 01/03/2022 13:08:50 -------------------------------------------------------------------------------- Pain Assessment Details Patient Name: Date of Service: Yolanda Anchors A. 01/03/2022 12:30 PM Medical Record Number: [id number] Patient Account Number: [id number] Date of Birth/Sex: Treating RN: [birth year] (51 y.o. Elam Dutch Primary Care Darik Massing: Pablo Lawrence Other Clinician: Referring Sharlena Kristensen: Treating Van Seymore/Extender: Drue Second in Treatment: 14 Active Problems Location of Pain Severity and Description of Pain Patient Has Paino Yes Site Locations Pain Location: Pain Location: Pain in Ulcers With Dressing Change: Yes Duration of the Pain. Constant / Intermittento Intermittent Rate the pain. Current Pain Level: 0 Worst Pain Level: 9 Least Pain Level: 0 Character of Pain Describe the Pain: Other: hurts to walk Pain Management and Medication Current Pain Management: Medication: Yes How does your wound impact your activities of daily livingo Sleep: No Bathing: No Appetite: No Relationship With Others: No Bladder Continence: No Emotions: No Bowel Continence: No Work: No Toileting: No Drive: No Dressing: No Hobbies: No Electronic Signature(s) Signed: 01/03/2022 6:25:59 PM By:  Baruch Gouty RN, BSN Entered By: Baruch Gouty on 01/03/2022  12:53:55 -------------------------------------------------------------------------------- Patient/Caregiver Education Details Patient Name: Date of Service: KELLSIE, GRINDLE 2/2/2023andnbsp12:30 PM Medical Record Number: [id number] Patient Account Number: [id number] Date of Birth/Gender: Treating RN: 01-06-1971 (51 y.o. Elam Dutch Primary Care Physician: Pablo Lawrence Other Clinician: Referring Physician: Treating Physician/Extender: Drue Second in Treatment: 14 Education Assessment Education Provided To: Patient Education Topics Provided Offloading: Methods: Explain/Verbal Responses: Reinforcements needed, State content correctly Wound/Skin Impairment: Methods: Explain/Verbal Responses: Reinforcements needed, State content correctly Electronic Signature(s) Signed: 01/03/2022 6:25:59 PM By: Baruch Gouty RN, BSN Entered By: Baruch Gouty on 01/03/2022 13:09:35 -------------------------------------------------------------------------------- Wound Assessment Details Patient Name: Date of Service: KESHONNA, VALVO 01/03/2022 12:30 PM Medical Record Number: [id number] Patient Account Number: [id number] Date of Birth/Sex: Treating RN: Jan 21, 1971 (51 y.o. Elam Dutch Primary Care Denette Hass: Pablo Lawrence Other Clinician: Referring Kenniel Bergsma: Treating Akhila Mahnken/Extender: Drue Second in Treatment: 14 Wound Status Wound Number: 1 Primary Diabetic Wound/Ulcer of the Lower Extremity Etiology: Wound Location: Right, Plantar Foot Wound Open Wounding Event: Gradually Appeared Status: Date Acquired: 09/01/2020 Comorbid Sleep Apnea, Congestive Heart Failure, Coronary Artery Disease, Weeks Of Treatment: 14 History: Hypertension, Myocardial Infarction, Type II Diabetes, Clustered Wound: No Osteoarthritis, Neuropathy Photos Wound Measurements Length: (cm) 0.3 Width: (cm) 0.4 Depth: (cm) 0.3 Area: (cm)  0.094 Volume: (cm) 0.028 % Reduction in Area: 94.3% % Reduction in Volume: 94.3% Epithelialization: Small (1-33%) Tunneling: No Undermining: No Wound Description Classification: Grade 2 Wound Margin: Thickened Exudate Amount: Medium Exudate Type: Serosanguineous Exudate Color: red, brown Foul Odor After Cleansing: No Slough/Fibrino Yes Wound Bed Granulation Amount: Large (67-100%) Exposed Structure Granulation Quality: Red, Pink Fascia Exposed: No Necrotic Amount: None Present (0%) Fat Layer (Subcutaneous Tissue) Exposed: Yes Tendon Exposed: No Muscle Exposed: No Joint Exposed: No Bone Exposed: No Treatment Notes Wound #1 (Foot) Wound Laterality: Plantar, Right Cleanser Soap and Water Discharge Instruction: May shower and wash wound with dial antibacterial soap and water prior to dressing change. Peri-Wound Care Topical Primary Dressing Hydrofera Blue Ready Foam, 2.5 x2.5 in Discharge Instruction: Apply to wound bed , cut to fit inside wound edges Secondary Dressing Woven Gauze Sponges 2x2 in Discharge Instruction: Apply over primary dressing as directed. Optifoam Non-Adhesive Dressing, 4x4 in Discharge Instruction: Foam donut Secured With Conforming Stretch Gauze Bandage, Sterile 2x75 (in/in) Discharge Instruction: Secure with stretch gauze as directed. Compression Wrap Compression Stockings Add-Ons Electronic Signature(s) Signed: 01/03/2022 6:25:59 PM By: Baruch Gouty RN, BSN Entered By: Baruch Gouty on 01/03/2022 12:57:07 -------------------------------------------------------------------------------- Vitals Details Patient Name: Date of Service: Yolanda Anchors A. 01/03/2022 12:30 PM Medical Record Number: [id number] Patient Account Number: [id number] Date of Birth/Sex: Treating RN: 09-16-1971 (51 y.o. Elam Dutch Primary Care Valdemar Mcclenahan: Pablo Lawrence Other Clinician: Referring Elynore Dolinski: Treating Eran Mistry/Extender: Drue Second in Treatment: 14 Vital Signs Time Taken: 12:50 Temperature (F): 98.7 Height (in): 67 Pulse (bpm): 123 Source: Stated Respiratory Rate (breaths/min): 18 Weight (lbs): 220 Blood Pressure (mmHg): 101/71 Source: Stated Capillary Blood Glucose (mg/dl): 300 Body Mass Index (BMI): 34.5 Reference Range: 80 - 120 mg / dl Notes glucose per pt report, pt states ate candy last night Electronic Signature(s) Signed: 01/03/2022 6:25:59 PM By: Baruch Gouty RN, BSN Entered By: Baruch Gouty on 01/03/2022 12:52:50
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
## 2022-01-08 ENCOUNTER — Other Ambulatory Visit: Payer: Self-pay | Admitting: Physician Assistant

## 2022-01-16 ENCOUNTER — Encounter (HOSPITAL_BASED_OUTPATIENT_CLINIC_OR_DEPARTMENT_OTHER): Payer: Medicaid Other | Admitting: Internal Medicine

## 2022-02-04 ENCOUNTER — Other Ambulatory Visit: Payer: Self-pay

## 2022-02-04 ENCOUNTER — Encounter (HOSPITAL_BASED_OUTPATIENT_CLINIC_OR_DEPARTMENT_OTHER): Payer: Medicaid Other | Attending: Internal Medicine | Admitting: General Surgery

## 2022-02-04 DIAGNOSIS — I2510 Atherosclerotic heart disease of native coronary artery without angina pectoris: Secondary | ICD-10-CM | POA: Diagnosis not present

## 2022-02-04 DIAGNOSIS — M797 Fibromyalgia: Secondary | ICD-10-CM | POA: Insufficient documentation

## 2022-02-04 DIAGNOSIS — L97518 Non-pressure chronic ulcer of other part of right foot with other specified severity: Secondary | ICD-10-CM | POA: Diagnosis not present

## 2022-02-04 DIAGNOSIS — G4733 Obstructive sleep apnea (adult) (pediatric): Secondary | ICD-10-CM | POA: Insufficient documentation

## 2022-02-04 DIAGNOSIS — I10 Essential (primary) hypertension: Secondary | ICD-10-CM | POA: Diagnosis not present

## 2022-02-04 DIAGNOSIS — I252 Old myocardial infarction: Secondary | ICD-10-CM | POA: Diagnosis not present

## 2022-02-04 DIAGNOSIS — E1142 Type 2 diabetes mellitus with diabetic polyneuropathy: Secondary | ICD-10-CM | POA: Diagnosis not present

## 2022-02-04 DIAGNOSIS — E11621 Type 2 diabetes mellitus with foot ulcer: Secondary | ICD-10-CM | POA: Insufficient documentation

## 2022-02-04 DIAGNOSIS — M1990 Unspecified osteoarthritis, unspecified site: Secondary | ICD-10-CM | POA: Diagnosis not present

## 2022-02-04 DIAGNOSIS — G40909 Epilepsy, unspecified, not intractable, without status epilepticus: Secondary | ICD-10-CM | POA: Diagnosis not present
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
## 2022-02-05 NOTE — Progress Notes (Signed)
[name], [name] ([id number]) Visit Report for 02/04/2022 Arrival Information Details Patient Name: Date of Service: Yolanda [name], Yolanda [name] 02/04/2022 12:30 PM Medical Record Number: [id number] Patient Account Number: [id number] Date of Birth/Sex: Treating RN: 1970/12/27 (51 y.o. F) Primary Care Yareli Carthen: Pablo Lawrence Other Clinician: Referring Tzipora Mcinroy: Treating Marquinn Meschke/Extender: Glenice Laine in Treatment: 18 Visit Information History Since Last Visit Added or deleted any medications: No Patient Arrived: Ambulatory Any new allergies or adverse reactions: No Arrival Time: 12:37 Had a fall or experienced change in No Accompanied By: Self activities of daily living that may affect Transfer Assistance: None risk of falls: Patient Identification Verified: Yes Signs or symptoms of abuse/neglect since last visito No Secondary Verification Process Completed: Yes Hospitalized since last visit: No Patient Requires Transmission-Based Precautions: No Implantable device outside of the clinic excluding No Patient Has Alerts: Yes cellular tissue based products placed in the center Patient Alerts: Patient on Blood Thinner since last visit: Has Dressing in Place as Prescribed: Yes Pain Present Now: No Electronic Signature(s) Signed: 02/05/2022 10:00:41 AM By: Sandre Kitty Entered By: Sandre Kitty on 02/04/2022 12:38:24 -------------------------------------------------------------------------------- Encounter Discharge Information Details Patient Name: Date of Service: Yolanda Anchors A. 02/04/2022 12:30 PM Medical Record Number: [id number] Patient Account Number: [id number] Date of Birth/Sex: Treating RN: July 27, 1971 (51 y.o. Yolanda [name] Primary Care Randie Bloodgood: Pablo Lawrence Other Clinician: Referring Cynia Abruzzo: Treating Yaniv Lage/Extender: Glenice Laine in Treatment: 18 Encounter Discharge Information Items Post Procedure  Vitals Discharge Condition: Stable Temperature (F): 98.5 Ambulatory Status: Ambulatory Pulse (bpm): 96 Discharge Destination: Home Respiratory Rate (breaths/min): 18 Transportation: Private Auto Blood Pressure (mmHg): 123/84 Accompanied By: self Schedule Follow-up Appointment: Yes Clinical Summary of Care: Patient Declined Electronic Signature(s) Signed: 02/04/2022 7:06:16 PM By: Dellie Catholic RN Entered By: Dellie Catholic on 02/04/2022 19:05:57 -------------------------------------------------------------------------------- Lower Extremity Assessment Details Patient Name: Date of Service: Yolanda [name], Yolanda [name] 02/04/2022 12:30 PM Medical Record Number: [id number] Patient Account Number: [id number] Date of Birth/Sex: Treating RN: 1971/08/18 (51 y.o. Yolanda [name] Primary Care Francis Doenges: Pablo Lawrence Other Clinician: Referring Fransisco Messmer: Treating Luceal Hollibaugh/Extender: Glenice Laine in Treatment: 18 Edema Assessment Assessed: [Left: No] [Right: No] Edema: [Left: N] [Right: o] Calf Left: Right: Point of Measurement: 31 cm From Medial Instep 33.5 cm Ankle Left: Right: Point of Measurement: 9 cm From Medial Instep 23 cm Knee To Floor Left: Right: From Medial Instep 45 cm 45 cm Electronic Signature(s) Signed: 02/04/2022 7:06:16 PM By: Dellie Catholic RN Entered By: Dellie Catholic on 02/04/2022 12:46:28 -------------------------------------------------------------------------------- Multi Wound Chart Details Patient Name: Date of Service: Yolanda Anchors A. 02/04/2022 12:30 PM Medical Record Number: [id number] Patient Account Number: [id number] Date of Birth/Sex: Treating RN: Nov 08, 1971 (51 y.o. F) Primary Care Jaxsen Bernhart: Pablo Lawrence Other Clinician: Referring Santresa Levett: Treating Bryn Perkin/Extender: Glenice Laine in Treatment: 18 Vital Signs Height(in): 51 Capillary Blood Glucose(mg/dl): 186 Weight(lbs):  220 Pulse(bpm): 48 Body Mass Index(BMI): 34.5 Blood Pressure(mmHg): 123/84 Temperature(F): 98.5 Respiratory Rate(breaths/min): 18 Photos: [N/A:N/A] Right, Plantar Foot N/A N/A Wound Location: Gradually Appeared N/A N/A Wounding Event: Diabetic Wound/Ulcer of the Lower N/A N/A Primary Etiology: Extremity Sleep Apnea, Congestive Heart N/A N/A Comorbid History: Failure, Coronary Artery Disease, Hypertension, Myocardial Infarction, Type II Diabetes, Osteoarthritis, Neuropathy 09/01/2020 N/A N/A Date Acquired: 18 N/A N/A Weeks of Treatment: Open N/A N/A Wound Status: No N/A N/A Wound Recurrence: 0.2x0.2x0.3 N/A N/A Measurements L x W x D (cm) 0.031 N/A N/A A (cm) : rea 0.009 N/A N/A Volume (cm) : 98.10% N/A N/A %  Reduction in A rea: 98.20% N/A N/A % Reduction in Volume: Grade 2 N/A N/A Classification: Medium N/A N/A Exudate A mount: Serosanguineous N/A N/A Exudate Type: red, [name] N/A N/A Exudate Color: Thickened N/A N/A Wound Margin: Medium (34-66%) N/A N/A Granulation A mount: Red, Pink N/A N/A Granulation Quality: Medium (34-66%) N/A N/A Necrotic A mount: Fat Layer (Subcutaneous Tissue): Yes N/A N/A Exposed Structures: Fascia: No Tendon: No Muscle: No Joint: No Bone: No Small (1-33%) N/A N/A Epithelialization: Debridement - Excisional N/A N/A Debridement: Pre-procedure Verification/Time Out 12:57 N/A N/A Taken: Lidocaine 5% topical ointment N/A N/A Pain Control: Callus, Subcutaneous, Slough N/A N/A Tissue Debrided: Skin/Subcutaneous Tissue N/A N/A Level: 0.04 N/A N/A Debridement A (sq cm): rea Curette N/A N/A Instrument: Minimum N/A N/A Bleeding: Pressure N/A N/A Hemostasis A chieved: 0 N/A N/A Procedural Pain: 0 N/A N/A Post Procedural Pain: Procedure was tolerated well N/A N/A Debridement Treatment Response: 0.2x0.2x0.3 N/A N/A Post Debridement Measurements L x W x D (cm) 0.009 N/A N/A Post Debridement Volume:  (cm) Debridement N/A N/A Procedures Performed: Treatment Notes Electronic Signature(s) Signed: 02/04/2022 1:01:47 PM By: Fredirick Maudlin MD FACS Entered By: Fredirick Maudlin on 02/04/2022 13:01:46 -------------------------------------------------------------------------------- Multi-Disciplinary Care Plan Details Patient Name: Date of Service: Yolanda Anchors A. 02/04/2022 12:30 PM Medical Record Number: [id number] Patient Account Number: [id number] Date of Birth/Sex: Treating RN: 20-Apr-1971 (51 y.o. Yolanda [name] Primary Care Judeen Geralds: Pablo Lawrence Other Clinician: Referring Carleen Rhue: Treating Lenward Able/Extender: Glenice Laine in Treatment: [place] reviewed with physician Active Inactive Nutrition Nursing Diagnoses: Impaired glucose control: actual or potential Goals: Patient/caregiver verbalizes understanding of need to maintain therapeutic glucose control per primary care physician Date Initiated: 09/25/2021 Target Resolution Date: 03/04/2022 Goal Status: Active Interventions: Assess HgA1c results as ordered upon admission and as needed Provide education on elevated blood sugars and impact on wound healing Provide education on nutrition Treatment Activities: Education provided on Nutrition : 11/22/2021 Notes: 10/30/21: Glucose control ongoing. Wound/Skin Impairment Nursing Diagnoses: Impaired tissue integrity Goals: Patient/caregiver will verbalize understanding of skin care regimen Date Initiated: 09/25/2021 T arget Resolution Date: 03/04/2022 Goal Status: Active Ulcer/skin breakdown will have a volume reduction of 30% by week 4 Date Initiated: 09/25/2021 Date Inactivated: 01/03/2022 Target Resolution Date: 11/20/2021 Unmet Reason: uncontrolled diabetes, Goal Status: Unmet hospitalization Interventions: Assess patient/caregiver ability to obtain necessary supplies Assess patient/caregiver ability to perform  ulcer/skin care regimen upon admission and as needed Assess ulceration(s) every visit Provide education on ulcer and skin care Treatment Activities: Topical wound management initiated : 09/25/2021 Notes: 10/30/21: Wound not yet at 30%, undermining noted Electronic Signature(s) Signed: 02/04/2022 7:06:16 PM By: Dellie Catholic RN Entered By: Dellie Catholic on 02/04/2022 19:03:47 -------------------------------------------------------------------------------- Pain Assessment Details Patient Name: Date of Service: Yolanda [name], Yolanda [name] 02/04/2022 12:30 PM Medical Record Number: [id number] Patient Account Number: [id number] Date of Birth/Sex: Treating RN: September 15, 1971 (51 y.o. F) Primary Care Paije Goodhart: Pablo Lawrence Other Clinician: Referring Fabio Wah: Treating Angellynn Kimberlin/Extender: Glenice Laine in Treatment: 18 Active Problems Location of Pain Severity and Description of Pain Patient Has Paino No Site Locations Pain Management and Medication Current Pain Management: Electronic Signature(s) Signed: 02/05/2022 10:00:41 AM By: Sandre Kitty Entered By: Sandre Kitty on 02/04/2022 12:39:06 -------------------------------------------------------------------------------- Patient/Caregiver Education Details Patient Name: Date of Service: [name] 3/6/2023andnbsp12:30 PM Medical Record Number: [id number] Patient Account Number: [id number] Date of Birth/Gender: Treating RN: December 18, 1970 (51 y.o. Yolanda [name] Primary Care Physician: Pablo Lawrence Other Clinician: Referring Physician: Treating Physician/Extender: Glenice Laine  in Treatment: 18 Education Assessment Education Provided To: Patient Education Topics Provided Wound/Skin Impairment: Methods: Explain/Verbal Responses: Return demonstration correctly Electronic Signature(s) Signed: 02/04/2022 7:06:16 PM By: Dellie Catholic RN Entered By: Dellie Catholic on  02/04/2022 19:04:10 -------------------------------------------------------------------------------- Wound Assessment Details Patient Name: Date of Service: Yolanda [name], Yolanda [name] 02/04/2022 12:30 PM Medical Record Number: [id number] Patient Account Number: [id number] Date of Birth/Sex: Treating RN: [birth year] (51 y.o. F) Primary Care Lucresia Simic: Pablo Lawrence Other Clinician: Referring Dannelle Rhymes: Treating Kani Chauvin/Extender: Glenice Laine in Treatment: 18 Wound Status Wound Number: 1 Primary Diabetic Wound/Ulcer of the Lower Extremity Etiology: Wound Location: Right, Plantar Foot Wound Open Wounding Event: Gradually Appeared Status: Date Acquired: 09/01/2020 Comorbid Sleep Apnea, Congestive Heart Failure, Coronary Artery Disease, Weeks Of Treatment: 18 History: Hypertension, Myocardial Infarction, Type II Diabetes, Clustered Wound: No Osteoarthritis, Neuropathy Photos Wound Measurements Length: (cm) 0.2 Width: (cm) 0.2 Depth: (cm) 0.3 Area: (cm) 0.031 Volume: (cm) 0.009 % Reduction in Area: 98.1% % Reduction in Volume: 98.2% Epithelialization: Small (1-33%) Wound Description Classification: Grade 2 Wound Margin: Thickened Exudate Amount: Medium Exudate Type: Serosanguineous Exudate Color: red, [name] Foul Odor After Cleansing: No Slough/Fibrino Yes Wound Bed Granulation Amount: Medium (34-66%) Exposed Structure Granulation Quality: Red, Pink Fascia Exposed: No Necrotic Amount: Medium (34-66%) Fat Layer (Subcutaneous Tissue) Exposed: Yes Tendon Exposed: No Muscle Exposed: No Joint Exposed: No Bone Exposed: No Treatment Notes Wound #1 (Foot) Wound Laterality: Plantar, Right Cleanser Soap and Water Discharge Instruction: May shower and wash wound with dial antibacterial soap and water prior to dressing change. Peri-Wound Care Topical Primary Dressing Hydrofera Blue Ready Foam, 2.5 x2.5 in Discharge Instruction: Apply to wound bed , cut to  fit inside wound edges Secondary Dressing Woven Gauze Sponges 2x2 in Discharge Instruction: Apply over primary dressing as directed. Optifoam Non-Adhesive Dressing, 4x4 in Discharge Instruction: Foam donut Secured With Conforming Stretch Gauze Bandage, Sterile 2x75 (in/in) Discharge Instruction: Secure with stretch gauze as directed. Compression Wrap Compression Stockings Add-Ons Electronic Signature(s) Signed: 02/04/2022 7:06:16 PM By: Dellie Catholic RN Entered By: Dellie Catholic on 02/04/2022 12:45:08 -------------------------------------------------------------------------------- Vitals Details Patient Name: Date of Service: Yolanda Anchors A. 02/04/2022 12:30 PM Medical Record Number: [id number] Patient Account Number: [id number] Date of Birth/Sex: Treating RN: 1971/09/20 (51 y.o. F) Primary Care Charmion Hapke: Pablo Lawrence Other Clinician: Referring Aneudy Champlain: Treating Kennetta Pavlovic/Extender: Glenice Laine in Treatment: 18 Vital Signs Time Taken: 12:38 Temperature (F): 98.5 Height (in): 67 Pulse (bpm): 96 Weight (lbs): 220 Respiratory Rate (breaths/min): 18 Body Mass Index (BMI): 34.5 Blood Pressure (mmHg): 123/84 Capillary Blood Glucose (mg/dl): 186 Reference Range: 80 - 120 mg / dl Electronic Signature(s) Signed: 02/05/2022 10:00:41 AM By: Sandre Kitty Entered By: Sandre Kitty on 02/04/2022 12:38:57

## 2022-02-11 ENCOUNTER — Encounter (HOSPITAL_BASED_OUTPATIENT_CLINIC_OR_DEPARTMENT_OTHER): Payer: Medicaid Other | Admitting: General Surgery

## 2022-02-11 ENCOUNTER — Other Ambulatory Visit: Payer: Self-pay

## 2022-02-11 DIAGNOSIS — E11621 Type 2 diabetes mellitus with foot ulcer: Secondary | ICD-10-CM | POA: Diagnosis not present
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
## 2022-02-13 ENCOUNTER — Other Ambulatory Visit: Payer: Self-pay

## 2022-02-13 ENCOUNTER — Encounter (HOSPITAL_BASED_OUTPATIENT_CLINIC_OR_DEPARTMENT_OTHER): Payer: Medicaid Other | Admitting: General Surgery

## 2022-02-13 DIAGNOSIS — E11621 Type 2 diabetes mellitus with foot ulcer: Secondary | ICD-10-CM | POA: Diagnosis not present

## 2022-02-13 NOTE — Progress Notes (Signed)
[name], [name] ([id number]) ?Visit Report for 02/13/2022 ?Arrival Information Details ?Patient Name: Date of Service: ?[name], [name] 02/13/2022 11:00 A M ?Medical Record Number: [id number] ?Patient Account Number: [id number] ?Date of Birth/Sex: Treating RN: ?[birth year] (51 y.o. F) [name], [name] ?Primary Care Keylani Perlstein: Pablo Lawrence Other Clinician: ?Referring Destynie Toomey: ?Treating Esai Stecklein/Extender: Fredirick Maudlin ?Pablo Lawrence ?Weeks in Treatment: 20 ?Visit Information History Since Last Visit ?Added or deleted any medications: No ?Patient Arrived: Ambulatory ?Any new allergies or adverse reactions: No ?Arrival Time: 11:19 ?Had a fall or experienced change in No ?Accompanied By: self ?activities of daily living that may affect ?Transfer Assistance: None ?risk of falls: ?Patient Requires Transmission-Based Precautions: No ?Signs or symptoms of abuse/neglect since last visito No ?Patient Has Alerts: Yes ?Hospitalized since last visit: No ?Patient Alerts: Patient on Blood Thinner ?Implantable device outside of the clinic excluding No ?cellular tissue based products placed in the center ?since last visit: ?Has Dressing in Place as Prescribed: Yes ?Pain Present Now: No ?Electronic Signature(s) ?Signed: 02/13/2022 2:59:29 PM By: Dellie Catholic RN ?Entered By: Dellie Catholic on 02/13/2022 11:19:57 ?-------------------------------------------------------------------------------- ?Encounter Discharge Information Details ?Patient Name: Date of Service: ?[name], [name] 02/13/2022 11:00 A M ?Medical Record Number: [id number] ?Patient Account Number: [id number] ?Date of Birth/Sex: Treating RN: ?21-Aug-1971 (51 y.o. F) [name], [name] ?Primary Care Teodoro Jeffreys: Pablo Lawrence Other Clinician: ?Referring Kacper Cartlidge: ?Treating Maguadalupe Lata/Extender: Fredirick Maudlin ?Pablo Lawrence ?Weeks in Treatment: 20 ?Encounter Discharge Information Items ?Discharge Condition: Stable ?Ambulatory Status: Ambulatory ?Discharge Destination:  Home ?Transportation: Private Auto ?Accompanied By: self ?Schedule Follow-up Appointment: Yes ?Clinical Summary of Care: Patient Declined ?Electronic Signature(s) ?Signed: 02/13/2022 2:59:29 PM By: Dellie Catholic RN ?Entered By: Dellie Catholic on 02/13/2022 12:38:16 ?-------------------------------------------------------------------------------- ?Lower Extremity Assessment Details ?Patient Name: ?Date of Service: ?[name], [name] 02/13/2022 11:00 A M ?Medical Record Number: [id number] ?Patient Account Number: [id number] ?Date of Birth/Sex: ?Treating RN: ?October 03, 1971 (51 y.o. F) [name], [name] ?Primary Care Donjuan Robison: Pablo Lawrence ?Other Clinician: ?Referring Emmamarie Kluender: ?Treating Emanii Bugbee/Extender: Fredirick Maudlin ?Pablo Lawrence ?Weeks in Treatment: 20 ?Edema Assessment ?Assessed: [Left: No] [Right: No] ?Edema: [Left: N] [Right: o] ?Calf ?Left: Right: ?Point of Measurement: 31 cm From Medial Instep 33.8 cm ?Ankle ?Left: Right: ?Point of Measurement: 9 cm From Medial Instep 22.1 cm ?Electronic Signature(s) ?Signed: 02/13/2022 2:59:29 PM By: Dellie Catholic RN ?Entered By: Dellie Catholic on 02/13/2022 11:30:12 ?-------------------------------------------------------------------------------- ?Multi Wound Chart Details ?Patient Name: ?Date of Service: ?[name], [name] 02/13/2022 11:00 A M ?Medical Record Number: [id number] ?Patient Account Number: [id number] ?Date of Birth/Sex: ?Treating RN: ?[birth year] (51 y.o. F) ?Primary Care Ilyas Lipsitz: Pablo Lawrence ?Other Clinician: ?Referring Landry Kamath: ?Treating Gunda Maqueda/Extender: Fredirick Maudlin ?Pablo Lawrence ?Weeks in Treatment: 20 ?Vital Signs ?Height(in): 67 ?Pulse(bpm): 97 ?Weight(lbs): 220 ?Blood Pressure(mmHg): 112/79 ?Body Mass Index(BMI): 34.5 ?Temperature(??F): 98.5 ?Respiratory Rate(breaths/min): 18 ?Photos: [N/A:N/A] ?Right, Plantar Foot N/A N/A ?Wound Location: ?Gradually Appeared N/A N/A ?Wounding Event: ?Diabetic Wound/Ulcer of the Lower N/A N/A ?Primary  Etiology: ?Extremity ?Sleep Apnea, Congestive Heart N/A N/A ?Comorbid History: ?Failure, Coronary Artery Disease, ?Hypertension, Myocardial Infarction, ?Type II Diabetes, Osteoarthritis, ?Neuropathy ?09/01/2020 N/A N/A ?Date Acquired: ?26 N/A N/A ?Weeks of Treatment: ?Open N/A N/A ?Wound Status: ?No N/A N/A ?Wound Recurrence: ?0.1x0.1x0.3 N/A N/A ?Measurements L x W x D (cm) ?0.008 N/A N/A ?A (cm?) : ?rea ?0.002 N/A N/A ?Volume (cm?) : ?99.50% N/A N/A ?% Reduction in A rea: ?99.60% N/A N/A ?% Reduction in Volume: ?Grade 2 N/A N/A ?Classification: ?Medium N/A N/A ?Exudate A mount: ?Serosanguineous N/A N/A ?Exudate Type: ?red, brown N/A N/A ?Exudate Color: ?Thickened N/A  N/A ?Wound Margin: ?Medium (34-66%) N/A N/A ?Granulation A mount: ?Red, Pink N/A N/A ?Granulation Quality: ?Medium (34-66%) N/A N/A ?Necrotic A mount: ?Fat Layer (Subcutaneous Tissue): Yes N/A N/A ?Exposed Structures: ?Fascia: No ?Tendon: No ?Muscle: No ?Joint: No ?Bone: No ?Small (1-33%) N/A N/A ?Epithelialization: ?Treatment Notes ?Electronic Signature(s) ?Signed: 02/13/2022 11:56:16 AM By: Fredirick Maudlin MD FACS ?Entered By: Fredirick Maudlin on 02/13/2022 11:56:16 ?-------------------------------------------------------------------------------- ?Multi-Disciplinary Care Plan Details ?Patient Name: ?Date of Service: ?[name], [name] 02/13/2022 11:00 A M ?Medical Record Number: [id number] ?Patient Account Number: [id number] ?Date of Birth/Sex: ?Treating RN: ?05-16-1971 (51 y.o. F) [name], [name] ?Primary Care Adi Seales: Pablo Lawrence ?Other Clinician: ?Referring Tyresse Jayson: ?Treating Lorayne Getchell/Extender: Fredirick Maudlin ?Pablo Lawrence ?Weeks in Treatment: 20 ?Multidisciplinary Care Plan reviewed with physician ?Active Inactive ?Nutrition ?Nursing Diagnoses: ?Impaired glucose control: actual or potential ?Goals: ?Patient/caregiver verbalizes understanding of need to maintain therapeutic glucose control per primary care physician ?Date Initiated:  09/25/2021 ?Target Resolution Date: 03/04/2022 ?Goal Status: Active ?Interventions: ?Assess HgA1c results as ordered upon admission and as needed ?Provide education on elevated blood sugars and impact on wound healing ?Provide education on nutrition ?Treatment Activities: ?Education provided on Nutrition : 11/22/2021 ?Notes: ?10/30/21: Glucose control ongoing. ?Wound/Skin Impairment ?Nursing Diagnoses: ?Impaired tissue integrity ?Goals: ?Patient/caregiver will verbalize understanding of skin care regimen ?Date Initiated: 09/25/2021 ?T arget Resolution Date: 03/04/2022 ?Goal Status: Active ?Ulcer/skin breakdown will have a volume reduction of 30% by week 4 ?Date Initiated: 09/25/2021 ?Date Inactivated: 01/03/2022 ?Target Resolution Date: 11/20/2021 ?Unmet Reason: uncontrolled diabetes, ?Goal Status: Unmet ?hospitalization ?Interventions: ?Assess patient/caregiver ability to obtain necessary supplies ?Assess patient/caregiver ability to perform ulcer/skin care regimen upon admission and as needed ?Assess ulceration(s) every visit ?Provide education on ulcer and skin care ?Treatment Activities: ?Topical wound management initiated : 09/25/2021 ?Notes: ?10/30/21: Wound not yet at 30%, undermining noted ?Electronic Signature(s) ?Signed: 02/13/2022 2:59:29 PM By: Dellie Catholic RN ?Entered By: Dellie Catholic on 02/13/2022 12:36:49 ?-------------------------------------------------------------------------------- ?Pain Assessment Details ?Patient Name: ?Date of Service: ?[name], [name] 02/13/2022 11:00 A M ?Medical Record Number: [id number] ?Patient Account Number: [id number] ?Date of Birth/Sex: ?Treating RN: ?[birth year] (51 y.o. F) [name], [name] ?Primary Care Kimiye Strathman: Pablo Lawrence ?Other Clinician: ?Referring Aidaly Cordner: ?Treating Fawn Desrocher/Extender: Fredirick Maudlin ?Pablo Lawrence ?Weeks in Treatment: 20 ?Active Problems ?Location of Pain Severity and Description of Pain ?Patient Has Paino No ?Site Locations ?Pain  Management and Medication ?Current Pain Management: ?Electronic Signature(s) ?Signed: 02/13/2022 2:59:29 PM By: Dellie Catholic RN ?Signed: 02/13/2022 2:59:29 PM By: Dellie Catholic RN ?Entered By: Dellie Catholic on
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
## 2022-02-15 NOTE — Progress Notes (Signed)
Saintvil, Rori A. ([id number]) ?Visit Report for 02/11/2022 ?Arrival Information Details ?Patient Name: Date of Service: ?Yolanda [name], [name] 02/11/2022 12:30 PM ?Medical Record Number: [id number] ?Patient Account Number: [id number] ?Date of Birth/Sex: Treating RN: ?[birth year] (51 y.o. [name], [name] ?Primary Care Safia Panzer: Pablo Lawrence Other Clinician: ?Referring Schawn Byas: ?Treating Knolan Simien/Extender: Fredirick Maudlin ?Pablo Lawrence ?Weeks in Treatment: 19 ?Visit Information History Since Last Visit ?Added or deleted any medications: No ?Patient Arrived: Ambulatory ?Any new allergies or adverse reactions: No ?Arrival Time: 12:42 ?Had a fall or experienced change in No ?Accompanied By: self ?activities of daily living that may affect ?Transfer Assistance: None ?risk of falls: ?Patient Identification Verified: Yes ?Signs or symptoms of abuse/neglect since last visito No ?Secondary Verification Process Completed: Yes ?Hospitalized since last visit: No ?Patient Requires Transmission-Based Precautions: No ?Implantable device outside of the clinic excluding No ?Patient Has Alerts: Yes ?cellular tissue based products placed in the center ?Patient Alerts: Patient on Blood Thinner since last visit: ?Has Dressing in Place as Prescribed: Yes ?Pain Present Now: No ?Electronic Signature(s) ?Signed: 02/15/2022 12:57:05 PM By: Rhae Hammock RN ?Entered By: Rhae Hammock on 02/11/2022 12:42:34 ?-------------------------------------------------------------------------------- ?Encounter Discharge Information Details ?Patient Name: Date of Service: ?Yolanda [name], [name] 02/11/2022 12:30 PM ?Medical Record Number: [id number] ?Patient Account Number: [id number] ?Date of Birth/Sex: Treating RN: ?August 19, 1971 (51 y.o. F) [name], [name] ?Primary Care Dhillon Comunale: Pablo Lawrence Other Clinician: ?Referring Lorimer Tiberio: ?Treating Frederik Standley/Extender: Fredirick Maudlin ?Pablo Lawrence ?Weeks in Treatment: 19 ?Encounter Discharge Information  Items Post Procedure Vitals ?Discharge Condition: Stable ?Temperature (F): 98.8 ?Ambulatory Status: Ambulatory ?Pulse (bpm): 110 ?Discharge Destination: Home ?Respiratory Rate (breaths/min): 17 ?Transportation: Private Auto ?Blood Pressure (mmHg): 105/70 ?Accompanied By: self ?Schedule Follow-up Appointment: Yes ?Clinical Summary of Care: Patient Declined ?Electronic Signature(s) ?Signed: 02/11/2022 5:34:14 PM By: Dellie Catholic RN ?Entered By: Dellie Catholic on 02/11/2022 17:31:52 ?-------------------------------------------------------------------------------- ?Lower Extremity Assessment Details ?Patient Name: ?Date of Service: ?Yolanda [name], [name] 02/11/2022 12:30 PM ?Medical Record Number: [id number] ?Patient Account Number: [id number] ?Date of Birth/Sex: ?Treating RN: ?[birth year] (51 y.o. [name], [name] ?Primary Care Nyoka Alcoser: Pablo Lawrence ?Other Clinician: ?Referring Carman Essick: ?Treating Colby Catanese/Extender: Fredirick Maudlin ?Pablo Lawrence ?Weeks in Treatment: 19 ?Edema Assessment ?Assessed: [Left: No] [Right: Yes] ?Edema: [Left: N] [Right: o] ?Calf ?Left: Right: ?Point of Measurement: 31 cm From Medial Instep 33.8 cm ?Ankle ?Left: Right: ?Point of Measurement: 9 cm From Medial Instep 22 cm ?Vascular Assessment ?Pulses: ?Dorsalis Pedis ?Palpable: [Right:Yes] ?Posterior Tibial ?Palpable: [Right:Yes] ?Electronic Signature(s) ?Signed: 02/15/2022 12:57:05 PM By: Rhae Hammock RN ?Entered By: Rhae Hammock on 02/11/2022 12:48:11 ?-------------------------------------------------------------------------------- ?Multi Wound Chart Details ?Patient Name: ?Date of Service: ?Yolanda [name], [name] 02/11/2022 12:30 PM ?Medical Record Number: [id number] ?Patient Account Number: [id number] ?Date of Birth/Sex: ?Treating RN: ?Apr 30, 1971 (51 y.o. F) ?Primary Care Ridhima Golberg: Pablo Lawrence ?Other Clinician: ?Referring Adem Costlow: ?Treating Toneka Fullen/Extender: Fredirick Maudlin ?Pablo Lawrence ?Weeks in Treatment: 19 ?Vital  Signs ?Height(in): 67 ?Capillary Blood Glucose(mg/dl): 186 ?Weight(lbs): 220 ?Pulse(bpm): 110 ?Body Mass Index(BMI): 34.5 ?Blood Pressure(mmHg): 105/70 ?Temperature(??F): 98.8 ?Respiratory Rate(breaths/min): 17 ?Photos: [1:No Photos Right, Plantar Foot] [N/A:N/A N/A] ?Wound Location: [1:Gradually Appeared] [N/A:N/A] ?Wounding Event: [1:Diabetic Wound/Ulcer of the Lower] [N/A:N/A] ?Primary Etiology: [1:Extremity Sleep Apnea, Congestive Heart] [N/A:N/A] ?Comorbid History: [1:Failure, Coronary Artery Disease, Hypertension, Myocardial Infarction, Type II Diabetes, Osteoarthritis, Neuropathy 09/01/2020] [N/A:N/A] ?Date Acquired: [1:19] [N/A:N/A] ?Weeks of Treatment: [1:Open] [N/A:N/A] ?Wound Status: [1:No] [N/A:N/A] ?Wound Recurrence: [1:0.2x0.2x0.5] [N/A:N/A] ?Measurements L x W x D (cm) [1:0.031] [N/A:N/A] ?A (cm?) : ?rea [1:0.016] [N/A:N/A] ?Volume (cm?) : [1:98.10%] [N/A:N/A] ?% Reduction in A rea: [1:96.80%] [N/A:N/A] ?% Reduction  in Volume: [1:Grade 2] [N/A:N/A] ?Classification: [1:Medium] [N/A:N/A] ?Exudate A mount: [1:Serosanguineous] [N/A:N/A] ?Exudate Type: [1:red, brown] [N/A:N/A] ?Exudate Color: [1:Thickened] [N/A:N/A] ?Wound Margin: [1:Medium (34-66%)] [N/A:N/A] ?Granulation A mount: [1:Red, Pink] [N/A:N/A] ?Granulation Quality: [1:Medium (34-66%)] [N/A:N/A] ?Necrotic A mount: ?[1:Fat Layer (Subcutaneous Tissue): Yes N/A] ?Exposed Structures: ?[1:Fascia: No Tendon: No Muscle: No Joint: No Bone: No Small (1-33%)] [N/A:N/A] ?Treatment Notes ?Electronic Signature(s) ?Signed: 02/11/2022 12:59:54 PM By: Fredirick Maudlin MD FACS ?Entered By: Fredirick Maudlin on 02/11/2022 12:59:54 ?-------------------------------------------------------------------------------- ?Multi-Disciplinary Care Plan Details ?Patient Name: ?Date of Service: ?Yolanda [name], [name] 02/11/2022 12:30 PM ?Medical Record Number: [id number] ?Patient Account Number: [id number] ?Date of Birth/Sex: ?Treating RN: ?[birth year] (51 y.o. F) [name], [name] ?Primary  Care Renn Stille: Pablo Lawrence ?Other Clinician: ?Referring Evart Mcdonnell: ?Treating Benson Porcaro/Extender: Fredirick Maudlin ?Pablo Lawrence ?Weeks in Treatment: 19 ?Multidisciplinary Care Plan reviewed with physician ?Active Inactive ?Nutrition ?Nursing Diagnoses: ?Impaired glucose control: actual or potential ?Goals: ?Patient/caregiver verbalizes understanding of need to maintain therapeutic glucose control per primary care physician ?Date Initiated: 09/25/2021 ?Target Resolution Date: 03/04/2022 ?Goal Status: Active ?Interventions: ?Assess HgA1c results as ordered upon admission and as needed ?Provide education on elevated blood sugars and impact on wound healing ?Provide education on nutrition ?Treatment Activities: ?Education provided on Nutrition : 11/22/2021 ?Notes: ?10/30/21: Glucose control ongoing. ?Wound/Skin Impairment ?Nursing Diagnoses: ?Impaired tissue integrity ?Goals: ?Patient/caregiver will verbalize understanding of skin care regimen ?Date Initiated: 09/25/2021 ?T arget Resolution Date: 03/04/2022 ?Goal Status: Active ?Ulcer/skin breakdown will have a volume reduction of 30% by week 4 ?Date Initiated: 09/25/2021 ?Date Inactivated: 01/03/2022 ?Target Resolution Date: 11/20/2021 ?Unmet Reason: uncontrolled diabetes, ?Goal Status: Unmet ?hospitalization ?Interventions: ?Assess patient/caregiver ability to obtain necessary supplies ?Assess patient/caregiver ability to perform ulcer/skin care regimen upon admission and as needed ?Assess ulceration(s) every visit ?Provide education on ulcer and skin care ?Treatment Activities: ?Topical wound management initiated : 09/25/2021 ?Notes: ?10/30/21: Wound not yet at 30%, undermining noted ?Electronic Signature(s) ?Signed: 02/11/2022 5:34:14 PM By: Dellie Catholic RN ?Entered By: Dellie Catholic on 02/11/2022 17:28:11 ?-------------------------------------------------------------------------------- ?Pain Assessment Details ?Patient Name: ?Date of Service: ?Yolanda [name], [name]  02/11/2022 12:30 PM ?Medical Record Number: [id number] ?Patient Account Number: [id number] ?Date of Birth/Sex: ?Treating RN: ?July 25, 1971 (51 y.o. [name], [name] ?Primary Care Emmilia Sowder: Kari Baars, Dia Crawford

## 2022-02-19 ENCOUNTER — Other Ambulatory Visit: Payer: Self-pay

## 2022-02-19 ENCOUNTER — Encounter (HOSPITAL_BASED_OUTPATIENT_CLINIC_OR_DEPARTMENT_OTHER): Payer: Medicaid Other | Admitting: General Surgery

## 2022-02-19 DIAGNOSIS — E11621 Type 2 diabetes mellitus with foot ulcer: Secondary | ICD-10-CM | POA: Diagnosis not present

## 2022-02-20 NOTE — Progress Notes (Signed)
Dockery, Ferrin A. ([id number]) ?Visit Report for 02/19/2022 ?Arrival Information Details ?Patient Name: Date of Service: ?Avants, Netra A. 02/19/2022 12:30 PM ?Medical Record Number: [id number] ?Patient Account Number: [id number] ?Date of Birth/Sex: Treating RN: ?1971/10/29 (51 y.o. Benjamine Sprague, Shatara ?Primary Care Shayley Medlin: Pablo Lawrence Other Clinician: ?Referring Nimai Burbach: ?Treating Rina Adney/Extender: Fredirick Maudlin ?Pablo Lawrence ?Weeks in Treatment: 21 ?Visit Information History Since Last Visit ?Added or deleted any medications: No ?Patient Arrived: Ambulatory ?Any new allergies or adverse reactions: No ?Arrival Time: 12:47 ?Had a fall or experienced change in No ?Accompanied By: alone ?activities of daily living that may affect ?Transfer Assistance: None ?risk of falls: ?Patient Identification Verified: Yes ?Signs or symptoms of abuse/neglect since last visito No ?Secondary Verification Process Completed: Yes ?Hospitalized since last visit: No ?Patient Requires Transmission-Based Precautions: No ?Implantable device outside of the clinic excluding No ?Patient Has Alerts: Yes ?cellular tissue based products placed in the center ?Patient Alerts: Patient on Blood Thinner since last visit: ?Has Dressing in Place as Prescribed: Yes ?Has Footwear/Offloading in Place as Prescribed: Yes ?Right: T Contact Cast ?otal ?Pain Present Now: No ?Electronic Signature(s) ?Signed: 02/19/2022 5:54:13 PM By: Levan Hurst RN, BSN ?Entered By: Levan Hurst on 02/19/2022 12:48:24 ?-------------------------------------------------------------------------------- ?Encounter Discharge Information Details ?Patient Name: Date of Service: ?Brilliant, Brandolyn A. 02/19/2022 12:30 PM ?Medical Record Number: [id number] ?Patient Account Number: [id number] ?Date of Birth/Sex: Treating RN: ?1971/11/08 (51 y.o. Benjamine Sprague, Shatara ?Primary Care Giavana Rooke: Pablo Lawrence Other Clinician: ?Referring Langston Tuberville: ?Treating Patricia Fargo/Extender: Fredirick Maudlin ?Pablo Lawrence ?Weeks in Treatment: 21 ?Encounter Discharge Information Items Post Procedure Vitals ?Discharge Condition: Stable ?Temperature (F): 98.5 ?Ambulatory Status: Ambulatory ?Pulse (bpm): 105 ?Discharge Destination: Home ?Respiratory Rate (breaths/min): 18 ?Transportation: Private Auto ?Blood Pressure (mmHg): 114/76 ?Accompanied By: alone ?Schedule Follow-up Appointment: Yes ?Clinical Summary of Care: Patient Declined ?Electronic Signature(s) ?Signed: 02/19/2022 5:54:13 PM By: Levan Hurst RN, BSN ?Entered By: Levan Hurst on 02/19/2022 16:25:22 ?-------------------------------------------------------------------------------- ?Lower Extremity Assessment Details ?Patient Name: ?Date of Service: ?Veal, Kaysie A. 02/19/2022 12:30 PM ?Medical Record Number: [id number] ?Patient Account Number: [id number] ?Date of Birth/Sex: ?Treating RN: ?12/06/1970 (51 y.o. Benjamine Sprague, Shatara ?Primary Care Yaira Bernardi: Pablo Lawrence ?Other Clinician: ?Referring Carine Nordgren: ?Treating Dwayne Begay/Extender: Fredirick Maudlin ?Pablo Lawrence ?Weeks in Treatment: 21 ?Edema Assessment ?Assessed: [Left: No] [Right: No] ?Edema: [Left: N] [Right: o] ?Calf ?Left: Right: ?Point of Measurement: 31 cm From Medial Instep 32.4 cm ?Ankle ?Left: Right: ?Point of Measurement: 9 cm From Medial Instep 22 cm ?Vascular Assessment ?Pulses: ?Dorsalis Pedis ?Palpable: [Right:Yes] ?Electronic Signature(s) ?Signed: 02/19/2022 5:54:13 PM By: Levan Hurst RN, BSN ?Entered By: Levan Hurst on 02/19/2022 12:53:38 ?-------------------------------------------------------------------------------- ?Multi Wound Chart Details ?Patient Name: ?Date of Service: ?Noorani, Naida A. 02/19/2022 12:30 PM ?Medical Record Number: [id number] ?Patient Account Number: [id number] ?Date of Birth/Sex: ?Treating RN: ?Apr 04, 1971 (51 y.o. Benjamine Sprague, Shatara ?Primary Care Hiliary Osorto: Pablo Lawrence ?Other Clinician: ?Referring Davari Lopes: ?Treating Twylia Oka/Extender: Fredirick Maudlin ?Pablo Lawrence ?Weeks in Treatment: 21 ?Vital Signs ?Height(in): 67 ?Capillary Blood Glucose(mg/dl): 229 ?Weight(lbs): 220 ?Pulse(bpm): 105 ?Body Mass Index(BMI): 34.5 ?Blood Pressure(mmHg): 114/76 ?Temperature(??F): 98.5 ?Respiratory Rate(breaths/min): 18 ?Photos: [1:Right, Plantar Foot] [N/A:N/A N/A] ?Wound Location: [1:Gradually Appeared] [N/A:N/A] ?Wounding Event: [1:Diabetic Wound/Ulcer of the Lower] [N/A:N/A] ?Primary Etiology: [1:Extremity Sleep Apnea, Congestive Heart] [N/A:N/A] ?Comorbid History: [1:Failure, Coronary Artery Disease, Hypertension, Myocardial Infarction, Type II Diabetes, Osteoarthritis, Neuropathy 09/01/2020] [N/A:N/A] ?Date Acquired: [1:21] [N/A:N/A] ?Weeks of Treatment: [1:Open] [N/A:N/A] ?Wound Status: [1:No] [N/A:N/A] ?Wound Recurrence: [1:0.7x0.9x0.3] [N/A:N/A] ?Measurements L x W x D (cm) [1:0.495] [N/A:N/A] ?A (cm?) : ?rea [1:0.148] [N/A:N/A] ?Volume (cm?) : [  1:70.00%] [N/A:N/A] ?% Reduction in A [1:rea: 70.10%] [N/A:N/A] ?% Reduction in Volume: [1:Grade 2] [N/A:N/A] ?Classification: [1:Medium] [N/A:N/A] ?Exudate A mount: [1:Purulent] [N/A:N/A] ?Exudate Type: [1:yellow, brown, green] [N/A:N/A] ?Exudate Color: [1:Thickened] [N/A:N/A] ?Wound Margin: [1:Large (67-100%)] [N/A:N/A] ?Granulation A mount: [1:Pink] [N/A:N/A] ?Granulation Quality: [1:None Present (0%)] [N/A:N/A] ?Necrotic A mount: ?[1:Fat Layer (Subcutaneous Tissue): Yes N/A] ?Exposed Structures: ?[1:Fascia: No Tendon: No Muscle: No Joint: No Bone: No Medium (34-66%)] [N/A:N/A] ?Epithelialization: [1:Debridement - Selective/Open Wound N/A] ?Debridement: ?Pre-procedure Verification/Time Out 13:09 [N/A:N/A] ?Taken: [1:Lidocaine 5% topical ointment] [N/A:N/A] ?Pain Control: [1:Callus] [N/A:N/A] ?Tissue Debrided: [1:Non-Viable Tissue] [N/A:N/A] ?Level: [1:0.09] [N/A:N/A] ?Debridement A (sq cm): [1:rea Curette] [N/A:N/A] ?Instrument: [1:Minimum] [N/A:N/A] ?Bleeding: [1:Pressure] [N/A:N/A] ?Hemostasis A chieved: [1:2]  [N/A:N/A] ?Procedural Pain: [1:0] [N/A:N/A] ?Post Procedural Pain: [1:Procedure was tolerated well] [N/A:N/A] ?Debridement Treatment Response: [1:0.7x0.9x0.3] [N/A:N/A] ?Post Debridement Measurements L x ?W x D (cm) [1:0.148] [N/A:N/A] ?Post Debridement Volume: (cm?) [1:Debridement] [N/A:N/A] ?Treatment Notes ?Electronic Signature(s) ?Signed: 02/19/2022 1:22:26 PM By: Fredirick Maudlin MD FACS ?Signed: 02/19/2022 5:54:13 PM By: Levan Hurst RN, BSN ?Entered By: Fredirick Maudlin on 02/19/2022 13:22:26 ?-------------------------------------------------------------------------------- ?Multi-Disciplinary Care Plan Details ?Patient Name: ?Date of Service: ?Sakamoto, Anara A. 02/19/2022 12:30 PM ?Medical Record Number: [id number] ?Patient Account Number: [id number] ?Date of Birth/Sex: ?Treating RN: ?01/23/1971 (51 y.o. F) Sharyn Creamer ?Primary Care Britne Borelli: Pablo Lawrence ?Other Clinician: ?Referring Jozlynn Plaia: ?Treating Andrianna Manalang/Extender: Fredirick Maudlin ?Pablo Lawrence ?Weeks in Treatment: 21 ?Multidisciplinary Care Plan reviewed with physician ?Active Inactive ?Nutrition ?Nursing Diagnoses: ?Impaired glucose control: actual or potential ?Goals: ?Patient/caregiver verbalizes understanding of need to maintain therapeutic glucose control per primary care physician ?Date Initiated: 09/25/2021 ?Target Resolution Date: 03/04/2022 ?Goal Status: Active ?Interventions: ?Assess HgA1c results as ordered upon admission and as needed ?Provide education on elevated blood sugars and impact on wound healing ?Provide education on nutrition ?Treatment Activities: ?Education provided on Nutrition : 11/22/2021 ?Notes: ?10/30/21: Glucose control ongoing. ?Wound/Skin Impairment ?Nursing Diagnoses: ?Impaired tissue integrity ?Goals: ?Patient/caregiver will verbalize understanding of skin care regimen ?Date Initiated: 09/25/2021 ?T arget Resolution Date: 03/04/2022 ?Goal Status: Active ?Ulcer/skin breakdown will have a volume reduction of 30% by  week 4 ?Date Initiated: 09/25/2021 ?Date Inactivated: 01/03/2022 ?Target Resolution Date: 11/20/2021 ?Unmet Reason: uncontrolled diabetes, ?Goal Status: Unmet ?hospitalization ?Interventions: ?Assess patient/caregiver
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
## 2022-02-24 LAB — AEROBIC/ANAEROBIC CULTURE W GRAM STAIN (SURGICAL/DEEP WOUND): Gram Stain: NONE SEEN

## 2022-02-27 ENCOUNTER — Encounter (HOSPITAL_BASED_OUTPATIENT_CLINIC_OR_DEPARTMENT_OTHER): Payer: Medicaid Other | Admitting: General Surgery

## 2022-02-27 DIAGNOSIS — E11621 Type 2 diabetes mellitus with foot ulcer: Secondary | ICD-10-CM | POA: Diagnosis not present
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
## 2022-02-28 NOTE — Progress Notes (Signed)
[name], Yolanda Yolanda. ([id number]) ?Visit Report for 02/27/2022 ?Arrival Information Details ?Patient Name: Date of Service: ?[name], Yolanda Yolanda. 02/27/2022 12:30 PM ?Medical Record Number: [id number] ?Patient Account Number: [id number] ?Date of Birth/Sex: Treating RN: ?[birth year] (51 y.o. F) Yolanda [name] ?Primary Care Yolanda [name]: Yolanda [name] Other Clinician: ?Referring Yolanda [name]: ?Treating Yolanda [name]/Extender: Yolanda [name] ?Yolanda [name] ?Weeks in Treatment: 22 ?Visit Information History Since Last Visit ?Added or deleted any medications: No ?Patient Arrived: Ambulatory ?Any new allergies or adverse reactions: No ?Arrival Time: 12:48 ?Had Yolanda fall or experienced change in No ?Accompanied By: self ?activities of daily living that may affect ?Transfer Assistance: None ?risk of falls: ?Patient Identification Verified: Yes ?Signs or symptoms of abuse/neglect since last visito No ?Secondary Verification Process Completed: Yes ?Hospitalized since last visit: No ?Patient Requires Transmission-Based Precautions: No ?Implantable device outside of the clinic excluding No ?Patient Has Alerts: Yes ?cellular tissue based products placed in the center ?Patient Alerts: Patient on Blood Thinner since last visit: ?Has Dressing in Place as Prescribed: Yes ?Pain Present Now: No ?Electronic Signature(s) ?Signed: 02/28/2022 9:20:58 AM By: Sandre Kitty ?Entered By: Sandre Kitty on 02/27/2022 12:49:06 ?-------------------------------------------------------------------------------- ?Encounter Discharge Information Details ?Patient Name: Date of Service: ?[name], Yolanda Yolanda. 02/27/2022 12:30 PM ?Medical Record Number: [id number] ?Patient Account Number: [id number] ?Date of Birth/Sex: Treating RN: ?1971/02/28 (51 y.o. F) Yolanda [name] ?Primary Care Yolanda [name]: Yolanda [name] Other Clinician: ?Referring Yolanda [name]: ?Treating Yolanda [name]/Extender: Yolanda [name] ?Yolanda [name] ?Weeks in Treatment: 22 ?Encounter Discharge Information  Items ?Discharge Condition: Stable ?Ambulatory Status: Ambulatory ?Discharge Destination: Home ?Transportation: Other ?Accompanied By: self ?Schedule Follow-up Appointment: Yes ?Clinical Summary of Care: Patient Declined ?Electronic Signature(s) ?Signed: 02/27/2022 2:40:19 PM By: Dellie Catholic RN ?Entered By: Dellie Catholic on 02/27/2022 14:39:46 ?-------------------------------------------------------------------------------- ?Lower Extremity Assessment Details ?Patient Name: ?Date of Service: ?[name], Yolanda Yolanda. 02/27/2022 12:30 PM ?Medical Record Number: [id number] ?Patient Account Number: [id number] ?Date of Birth/Sex: ?Treating RN: ?05-Oct-1971 (51 y.o. F) Yolanda [name] ?Primary Care Nikyla Navedo: Yolanda [name] ?Other Clinician: ?Referring Yolanda [name]: ?Treating Yolanda [name]/Extender: Yolanda [name] ?Yolanda [name] ?Weeks in Treatment: 22 ?Edema Assessment ?Assessed: [Left: No] [Right: No] ?Edema: [Left: N] [Right: o] ?Calf ?Left: Right: ?Point of Measurement: 31 cm From Medial Instep 32 cm ?Ankle ?Left: Right: ?Point of Measurement: 9 cm From Medial Instep 22 cm ?Electronic Signature(s) ?Signed: 02/27/2022 2:40:19 PM By: Dellie Catholic RN ?Entered By: Dellie Catholic on 02/27/2022 13:11:25 ?-------------------------------------------------------------------------------- ?Multi Wound Chart Details ?Patient Name: ?Date of Service: ?[name], Yolanda Yolanda. 02/27/2022 12:30 PM ?Medical Record Number: [id number] ?Patient Account Number: [id number] ?Date of Birth/Sex: ?Treating RN: ?Apr 30, 1971 (51 y.o. F) Yolanda [name] ?Primary Care Yolanda [name]: Yolanda [name] ?Other Clinician: ?Referring Yolanda [name]: ?Treating Aanya Haynes/Extender: Yolanda [name] ?Yolanda [name] ?Weeks in Treatment: 22 ?Vital Signs ?Height(in): 67 ?Pulse(bpm): 97 ?Weight(lbs): 220 ?Blood Pressure(mmHg): 106/74 ?Body Mass Index(BMI): 34.5 ?Temperature(??F): 98.4 ?Respiratory Rate(breaths/min): 18 ?Photos: [N/Yolanda:N/Yolanda] ?Right, Plantar Foot N/Yolanda N/Yolanda ?Wound  Location: ?Gradually Appeared N/Yolanda N/Yolanda ?Wounding Event: ?Diabetic Wound/Ulcer of the Lower N/Yolanda N/Yolanda ?Primary Etiology: ?Extremity ?Sleep Apnea, Congestive Heart N/Yolanda N/Yolanda ?Comorbid History: ?Failure, Coronary Artery Disease, ?Hypertension, Yolanda [name], ?Type II Diabetes, Osteoarthritis, ?Neuropathy ?09/01/2020 N/Yolanda N/Yolanda ?Date Acquired: ?73 N/Yolanda N/Yolanda ?Weeks of Treatment: ?Open N/Yolanda N/Yolanda ?Wound Status: ?No N/Yolanda N/Yolanda ?Wound Recurrence: ?0.1x0.1x0.1 N/Yolanda N/Yolanda ?Measurements L x W x D (cm) ?0.008 N/Yolanda N/Yolanda ?Yolanda (cm?) : ?rea ?0.001 N/Yolanda N/Yolanda ?Volume (cm?) : ?99.50% N/Yolanda N/Yolanda ?% Reduction in Yolanda rea: ?99.80% N/Yolanda N/Yolanda ?% Reduction in Volume: ?Grade 2 N/Yolanda N/Yolanda ?Classification: ?Medium N/Yolanda N/Yolanda ?Exudate Yolanda mount: ?Purulent N/Yolanda N/Yolanda ?Exudate Type: ?yellow, brown, green  N/Yolanda N/Yolanda ?Exudate Color: ?Thickened N/Yolanda N/Yolanda ?Wound Margin: ?Large (67-100%) N/Yolanda N/Yolanda ?Granulation Yolanda mount: ?Pink N/Yolanda N/Yolanda ?Granulation Quality: ?None Present (0%) N/Yolanda N/Yolanda ?Necrotic Yolanda mount: ?Fat Layer (Subcutaneous Tissue): Yes N/Yolanda N/Yolanda ?Exposed Structures: ?Fascia: No ?Tendon: No ?Muscle: No ?Joint: No ?Bone: No ?Medium (34-66%) N/Yolanda N/Yolanda ?Epithelialization: ?T Contact Cast ?otal N/Yolanda N/Yolanda ?Procedures Performed: ?Treatment Notes ?Electronic Signature(s) ?Signed: 02/27/2022 1:40:34 PM By: Yolanda Maudlin MD FACS ?Signed: 02/27/2022 2:40:19 PM By: Dellie Catholic RN ?Entered By: Yolanda [name] on 02/27/2022 13:40:34 ?-------------------------------------------------------------------------------- ?Multi-Disciplinary Care Plan Details ?Patient Name: ?Date of Service: ?[name], Yolanda Yolanda. 02/27/2022 12:30 PM ?Medical Record Number: [id number] ?Patient Account Number: [id number] ?Date of Birth/Sex: ?Treating RN: ?09-Jul-1971 (51 y.o. F) Yolanda [name] ?Primary Care Yolanda [name]: Yolanda [name] ?Other Clinician: ?Referring Yolanda [name]: ?Treating Yolanda [name]/Extender: Yolanda [name] ?Yolanda [name] ?Weeks in Treatment: 22 ?Multidisciplinary Care Plan reviewed with physician ?Active  Inactive ?Nutrition ?Nursing Diagnoses: ?Impaired glucose control: actual or potential ?Goals: ?Patient/caregiver verbalizes understanding of need to maintain therapeutic glucose control per primary care physician ?Date Initiated: 09/25/2021 ?Target Resolution Date: 03/04/2022 ?Goal Status: Active ?Interventions: ?Assess HgA1c results as ordered upon admission and as needed ?Provide education on elevated blood sugars and impact on wound healing ?Provide education on nutrition ?Treatment Activities: ?Education provided on Nutrition : 11/22/2021 ?Notes: ?10/30/21: Glucose control ongoing. ?Wound/Skin Impairment ?Nursing Diagnoses: ?Impaired tissue integrity ?Goals: ?Patient/caregiver will verbalize understanding of skin care regimen ?Date Initiated: 09/25/2021 ?T arget Resolution Date: 03/04/2022 ?Goal Status: Active ?Ulcer/skin breakdown will have Yolanda volume reduction of 30% by week 4 ?Date Initiated: 09/25/2021 ?Date Inactivated: 01/03/2022 ?Target Resolution Date: 11/20/2021 ?Unmet Reason: uncontrolled diabetes, ?Goal Status: Unmet ?hospitalization ?Interventions: ?Assess patient/caregiver ability to obtain necessary supplies ?Assess patient/caregiver ability to perform ulcer/skin care regimen upon admission and as needed ?Assess ulceration(s) every visit ?Provide education on ulcer and skin care ?Treatment Activities: ?Topical wound management initiated : 09/25/2021 ?Notes: ?10/30/21: Wound not yet at 30%, undermining noted ?Electronic Signature(s) ?Signed: 02/27/2022 2:40:19 PM By: Dellie Catholic RN ?Entered By: Dellie Catholic on 02/27/2022 14:38:06 ?-------------------------------------------------------------------------------- ?Pain Assessment Details ?Patient Name: ?Date of Service: ?[name], Yolanda Yolanda. 02/27/2022 12:30 PM ?Medical Record Number: [id number] ?Patient Account Number: [id number] ?Date of Birth/Sex: ?Treating RN: ?05/06/1971 (51 y.o. F) Yolanda [name] ?Primary Care Willetta York: Yolanda [name] ?Other  Clinician: ?Referring Oretha Weismann: ?Treating Amelia Burgard/Extender: Yolanda [name] ?Yolanda [name] ?Weeks in Treatment: 22 ?Active Problems ?Location of Pain Severity and Description of Pain ?Patient Has Paino No ?Site Locations ?Pain Management and Medication

## 2022-03-06 ENCOUNTER — Encounter (HOSPITAL_BASED_OUTPATIENT_CLINIC_OR_DEPARTMENT_OTHER): Payer: Medicaid Other | Attending: General Surgery | Admitting: General Surgery
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
## 2022-03-07 NOTE — Progress Notes (Signed)
Siska, Yeimy A. ([id number]) ?Visit Report for 03/06/2022 ?Arrival Information Details ?Patient Name: Date of Service: ?Yolanda [name], [name] 03/06/2022 1:15 PM ?Medical Record Number: [id number] ?Patient Account Number: [id number] ?Date of Birth/Sex: Treating RN: ?1971/02/13 (51 y.o. F) Sharyn Creamer ?Primary Care Jelani Trueba: Pablo Lawrence Other Clinician: ?Referring Mykayla Brinton: ?Treating Jaymie Mckiddy/Extender: Fredirick Maudlin ?Pablo Lawrence ?Weeks in Treatment: 23 ?Visit Information History Since Last Visit ?Added or deleted any medications: No ?Patient Arrived: Ambulatory ?Any new allergies or adverse reactions: No ?Arrival Time: 13:38 ?Had a fall or experienced change in No ?Accompanied By: husband ?activities of daily living that may affect ?Transfer Assistance: None ?risk of falls: ?Patient Identification Verified: Yes ?Signs or symptoms of abuse/neglect since last visito No ?Secondary Verification Process Completed: Yes ?Hospitalized since last visit: No ?Patient Requires Transmission-Based Precautions: No ?Implantable device outside of the clinic excluding No ?Patient Has Alerts: Yes ?cellular tissue based products placed in the center ?Patient Alerts: Patient on Blood Thinner since last visit: ?Has Dressing in Place as Prescribed: Yes ?Has Footwear/Offloading in Place as Prescribed: Yes ?Right: T Contact Cast ?otal ?Pain Present Now: No ?Electronic Signature(s) ?Signed: 03/06/2022 5:44:01 PM By: Sharyn Creamer RN, BSN ?Entered By: Sharyn Creamer on 03/06/2022 14:01:38 ?-------------------------------------------------------------------------------- ?Encounter Discharge Information Details ?Patient Name: Date of Service: ?Yolanda [name], [name] 03/06/2022 1:15 PM ?Medical Record Number: [id number] ?Patient Account Number: [id number] ?Date of Birth/Sex: Treating RN: ?[birth year] (51 y.o. F) Sharyn Creamer ?Primary Care Chayil Gantt: Pablo Lawrence Other Clinician: ?Referring Raeonna Milo: ?Treating Violette Morneault/Extender: Fredirick Maudlin ?Pablo Lawrence ?Weeks in Treatment: 23 ?Encounter Discharge Information Items ?Discharge Condition: Stable ?Ambulatory Status: Ambulatory ?Discharge Destination: Home ?Transportation: Private Auto ?Accompanied By: husband ?Schedule Follow-up Appointment: Yes ?Clinical Summary of Care: Patient Declined ?Electronic Signature(s) ?Signed: 03/06/2022 5:44:01 PM By: Sharyn Creamer RN, BSN ?Entered By: Sharyn Creamer on 03/06/2022 17:13:57 ?-------------------------------------------------------------------------------- ?Lower Extremity Assessment Details ?Patient Name: ?Date of Service: ?Yolanda [name], [name] 03/06/2022 1:15 PM ?Medical Record Number: [id number] ?Patient Account Number: [id number] ?Date of Birth/Sex: ?Treating RN: ?11-01-1971 (51 y.o. F) Sharyn Creamer ?Primary Care Martino Tompson: Pablo Lawrence ?Other Clinician: ?Referring Carry Ortez: ?Treating Jaclin Finks/Extender: Fredirick Maudlin ?Pablo Lawrence ?Weeks in Treatment: 23 ?Edema Assessment ?Assessed: [Left: No] [Right: No] ?Edema: [Left: N] [Right: o] ?Calf ?Left: Right: ?Point of Measurement: 31 cm From Medial Instep 33.5 cm ?Ankle ?Left: Right: ?Point of Measurement: 9 cm From Medial Instep 22 cm ?Vascular Assessment ?Pulses: ?Dorsalis Pedis ?Palpable: [Right:Yes] ?Electronic Signature(s) ?Signed: 03/06/2022 5:44:01 PM By: Sharyn Creamer RN, BSN ?Entered By: Sharyn Creamer on 03/06/2022 13:54:50 ?-------------------------------------------------------------------------------- ?Multi Wound Chart Details ?Patient Name: ?Date of Service: ?Yolanda [name], [name] 03/06/2022 1:15 PM ?Medical Record Number: [id number] ?Patient Account Number: [id number] ?Date of Birth/Sex: ?Treating RN: ?1971/09/14 (51 y.o. F) [name], [name] ?Primary Care Akia Montalban: Pablo Lawrence ?Other Clinician: ?Referring Randell Teare: ?Treating Merik Mignano/Extender: Fredirick Maudlin ?Pablo Lawrence ?Weeks in Treatment: 23 ?Vital Signs ?Height(in): 67 ?Capillary Blood Glucose(mg/dl): 139 ?Weight(lbs):  220 ?Pulse(bpm): 105 ?Body Mass Index(BMI): 34.5 ?Blood Pressure(mmHg): 139/89 ?Temperature(??F): 98.7 ?Respiratory Rate(breaths/min): 18 ?Photos: [1:Right, Plantar Foot] [N/A:N/A N/A] ?Wound Location: [1:Gradually Appeared] [N/A:N/A] ?Wounding Event: [1:Diabetic Wound/Ulcer of the Lower] [N/A:N/A] ?Primary Etiology: [1:Extremity Sleep Apnea, Congestive Heart] [N/A:N/A] ?Comorbid History: [1:Failure, Coronary Artery Disease, Hypertension, Myocardial Infarction, Type II Diabetes, Osteoarthritis, Neuropathy 09/01/2020] [N/A:N/A] ?Date Acquired: [1:23] [N/A:N/A] ?Weeks of Treatment: [1:Open] [N/A:N/A] ?Wound Status: [1:No] [N/A:N/A] ?Wound Recurrence: [1:0x0x0] [N/A:N/A] ?Measurements L x W x D (cm) [1:0] [N/A:N/A] ?A (cm?) : ?rea [1:0] [N/A:N/A] ?Volume (cm?) : [1:100.00%] [N/A:N/A] ?% Reduction in A rea: [1:100.00%] [N/A:N/A] ?% Reduction in Volume: [1:Grade 2] [N/A:N/A] ?Classification: [  1:None Present] [N/A:N/A] ?Exudate A mount: [1:Thickened] [N/A:N/A] ?Wound Margin: [1:None Present (0%)] [N/A:N/A] ?Granulation A mount: [1:None Present (0%)] [N/A:N/A] ?Necrotic A mount: ?[1:Fascia: No] [N/A:N/A] ?Exposed Structures: ?[1:Fat Layer (Subcutaneous Tissue): No Tendon: No Muscle: No Joint: No Bone: No Large (67-100%)] [N/A:N/A] ?Treatment Notes ?Electronic Signature(s) ?Signed: 03/06/2022 2:05:29 PM By: Fredirick Maudlin MD FACS ?Signed: 03/07/2022 8:30:42 AM By: Dellie Catholic RN ?Entered By: Fredirick Maudlin on 03/06/2022 14:05:28 ?-------------------------------------------------------------------------------- ?Multi-Disciplinary Care Plan Details ?Patient Name: ?Date of Service: ?Yolanda [name], [name] 03/06/2022 1:15 PM ?Medical Record Number: [id number] ?Patient Account Number: [id number] ?Date of Birth/Sex: ?Treating RN: ?1971-09-27 (51 y.o. F) Sharyn Creamer ?Primary Care Yanci Bachtell: Pablo Lawrence ?Other Clinician: ?Referring Jiles Goya: ?Treating Fabiana Dromgoole/Extender: Fredirick Maudlin ?Pablo Lawrence ?Weeks in Treatment:  23 ?Multidisciplinary Care Plan reviewed with physician ?Active Inactive ?Electronic Signature(s) ?Signed: 03/06/2022 5:44:01 PM By: Sharyn Creamer RN, BSN ?Entered By: Sharyn Creamer on 03/06/2022 17:15:41 ?-------------------------------------------------------------------------------- ?Pain Assessment Details ?Patient Name: ?Date of Service: ?Yolanda [name], [name] 03/06/2022 1:15 PM ?Medical Record Number: [id number] ?Patient Account Number: [id number] ?Date of Birth/Sex: ?Treating RN: ?02/17/1971 (51 y.o. F) Sharyn Creamer ?Primary Care Florette Thai: ?Other Clinician: ?Pablo Lawrence ?Referring Adrien Dietzman: ?Treating Evalyne Cortopassi/Extender: Fredirick Maudlin ?Pablo Lawrence ?Weeks in Treatment: 23 ?Active Problems ?Location of Pain Severity and Description of Pain ?Patient Has Paino No ?Site Locations ?Pain Management and Medication ?Current Pain Management: ?Electronic Signature(s) ?Signed: 03/06/2022 5:44:01 PM By: Sharyn Creamer RN, BSN ?Entered By: Sharyn Creamer on 03/06/2022 13:42:19 ?-------------------------------------------------------------------------------- ?Patient/Caregiver Education Details ?Patient Name: ?Date of Service: ?Yolanda [name], [name] 4/5/2023andnbsp1:15 PM ?Medical Record Number: [id number] ?Patient Account Number: [id number] ?Date of Birth/Gender: ?Treating RN: ?17-Mar-1971 (51 y.o. F) Sharyn Creamer ?Primary Care Physician: Pablo Lawrence ?Other Clinician: ?Referring Physician: ?Treating Physician/Extender: Fredirick Maudlin ?Pablo Lawrence ?Weeks in Treatment: 23 ?Education Assessment ?Education Provided To: ?Patient ?Education Topics Provided ?Wound/Skin Impairment: ?Methods: Explain/Verbal ?Responses: State content correctly ?Electronic Signature(s) ?Signed: 03/06/2022 5:44:01 PM By: Sharyn Creamer RN, BSN ?Entered By: Sharyn Creamer on 03/06/2022 13:37:55 ?-------------------------------------------------------------------------------- ?Wound Assessment Details ?Patient Name: ?Date of Service: ?Yolanda [name],  [name] 03/06/2022 1:15 PM ?Medical Record Number: [id number] ?Patient Account Number: [id number] ?Date of Birth/Sex: ?Treating RN: ?Aug 18, 1971 (51 y.o. F) Sharyn Creamer ?Primary Care Jamieka Royle: Pablo Lawrence ?Other Clinician: ?R

## 2022-03-08 ENCOUNTER — Ambulatory Visit: Payer: Medicaid Other | Admitting: Cardiology

## 2022-03-13 ENCOUNTER — Ambulatory Visit (HOSPITAL_BASED_OUTPATIENT_CLINIC_OR_DEPARTMENT_OTHER): Payer: Medicaid Other | Admitting: General Surgery

## 2022-03-20 ENCOUNTER — Other Ambulatory Visit: Payer: Medicaid Other

## 2022-03-20 ENCOUNTER — Ambulatory Visit (INDEPENDENT_AMBULATORY_CARE_PROVIDER_SITE_OTHER): Payer: Medicaid Other | Admitting: Podiatry

## 2022-03-20 ENCOUNTER — Encounter: Payer: Self-pay | Admitting: Podiatry

## 2022-03-20 DIAGNOSIS — L97411 Non-pressure chronic ulcer of right heel and midfoot limited to breakdown of skin: Secondary | ICD-10-CM

## 2022-03-21 NOTE — Progress Notes (Signed)
Subjective:  ? ?Patient ID: Yolanda [name], female   DOB: 51 y.o.   MRN: [id number]  ? ?HPI ?Patient presents after having had ulceration of the right plantar foot that was reduced and eliminated by wound care but has left her with a prominent metatarsal and that worried that this could become chronic ulceration.  Patient is obese which is complicating factor does have diabetes and tries to be active ? ? ?Review of Systems  ?All other systems reviewed and are negative. ? ? ?   ?Objective:  ?Physical Exam ?Vitals and nursing note reviewed.  ?Constitutional:   ?   Appearance: She is well-developed.  ?Pulmonary:  ?   Effort: Pulmonary effort is normal.  ?Musculoskeletal:     ?   General: Normal range of motion.  ?Skin: ?   General: Skin is warm.  ?Neurological:  ?   Mental Status: She is alert.  ?  ?Neurovascular status found to be moderately reduced with diminished sharp dull vibratory.  I did note a prominence of the fourth metatarsal head right with previous ulcer that healed with keratotic tissue that is light in its nature ? ?   ?Assessment:  ?At risk patient with several complicating factors with plantarflexed metatarsal ? ?   ?Plan:  ?H&P reviewed and were good to have the pedorthist work with her to try to develop some offloading of this area.  Patient is worked on by the pedorthist will be seen back on an as-needed basis and if any breakdown of tissue were to occur it is to be seen back immediately ?   ? ? ?

## 2022-04-05 ENCOUNTER — Encounter (HOSPITAL_COMMUNITY): Payer: Self-pay

## 2022-04-05 ENCOUNTER — Inpatient Hospital Stay (HOSPITAL_COMMUNITY)
Admission: EM | Admit: 2022-04-05 | Discharge: 2022-04-11 | DRG: 871 | Disposition: A | Discharge status: Home Health | Payer: Medicare Other | Attending: Internal Medicine | Admitting: Internal Medicine

## 2022-04-05 ENCOUNTER — Other Ambulatory Visit: Payer: Self-pay

## 2022-04-05 ENCOUNTER — Emergency Department (HOSPITAL_COMMUNITY): Payer: Medicare Other

## 2022-04-05 DIAGNOSIS — R4182 Altered mental status, unspecified: Secondary | ICD-10-CM

## 2022-04-05 DIAGNOSIS — Z833 Family history of diabetes mellitus: Secondary | ICD-10-CM

## 2022-04-05 DIAGNOSIS — K219 Gastro-esophageal reflux disease without esophagitis: Secondary | ICD-10-CM | POA: Diagnosis present

## 2022-04-05 DIAGNOSIS — Z83438 Family history of other disorder of lipoprotein metabolism and other lipidemia: Secondary | ICD-10-CM

## 2022-04-05 DIAGNOSIS — I10 Essential (primary) hypertension: Secondary | ICD-10-CM | POA: Diagnosis present

## 2022-04-05 DIAGNOSIS — Z888 Allergy status to other drugs, medicaments and biological substances status: Secondary | ICD-10-CM

## 2022-04-05 DIAGNOSIS — R32 Unspecified urinary incontinence: Secondary | ICD-10-CM | POA: Diagnosis present

## 2022-04-05 DIAGNOSIS — Z882 Allergy status to sulfonamides status: Secondary | ICD-10-CM

## 2022-04-05 DIAGNOSIS — R197 Diarrhea, unspecified: Secondary | ICD-10-CM | POA: Diagnosis present

## 2022-04-05 DIAGNOSIS — Z20822 Contact with and (suspected) exposure to covid-19: Secondary | ICD-10-CM | POA: Diagnosis present

## 2022-04-05 DIAGNOSIS — Z9049 Acquired absence of other specified parts of digestive tract: Secondary | ICD-10-CM

## 2022-04-05 DIAGNOSIS — R7989 Other specified abnormal findings of blood chemistry: Secondary | ICD-10-CM

## 2022-04-05 DIAGNOSIS — I5032 Chronic diastolic (congestive) heart failure: Secondary | ICD-10-CM | POA: Diagnosis present

## 2022-04-05 DIAGNOSIS — I5022 Chronic systolic (congestive) heart failure: Secondary | ICD-10-CM

## 2022-04-05 DIAGNOSIS — N10 Acute tubulo-interstitial nephritis: Secondary | ICD-10-CM

## 2022-04-05 DIAGNOSIS — E441 Mild protein-calorie malnutrition: Secondary | ICD-10-CM | POA: Diagnosis present

## 2022-04-05 DIAGNOSIS — R739 Hyperglycemia, unspecified: Secondary | ICD-10-CM | POA: Diagnosis present

## 2022-04-05 DIAGNOSIS — I110 Hypertensive heart disease with heart failure: Secondary | ICD-10-CM | POA: Diagnosis present

## 2022-04-05 DIAGNOSIS — E782 Mixed hyperlipidemia: Secondary | ICD-10-CM | POA: Diagnosis present

## 2022-04-05 DIAGNOSIS — A419 Sepsis, unspecified organism: Principal | ICD-10-CM | POA: Diagnosis present

## 2022-04-05 DIAGNOSIS — Z7982 Long term (current) use of aspirin: Secondary | ICD-10-CM

## 2022-04-05 DIAGNOSIS — E669 Obesity, unspecified: Secondary | ICD-10-CM | POA: Diagnosis present

## 2022-04-05 DIAGNOSIS — I2510 Atherosclerotic heart disease of native coronary artery without angina pectoris: Secondary | ICD-10-CM | POA: Diagnosis present

## 2022-04-05 DIAGNOSIS — Z7984 Long term (current) use of oral hypoglycemic drugs: Secondary | ICD-10-CM

## 2022-04-05 DIAGNOSIS — E46 Unspecified protein-calorie malnutrition: Secondary | ICD-10-CM

## 2022-04-05 DIAGNOSIS — Z79899 Other long term (current) drug therapy: Secondary | ICD-10-CM

## 2022-04-05 DIAGNOSIS — M5136 Other intervertebral disc degeneration, lumbar region: Secondary | ICD-10-CM | POA: Diagnosis present

## 2022-04-05 DIAGNOSIS — F603 Borderline personality disorder: Secondary | ICD-10-CM | POA: Diagnosis present

## 2022-04-05 DIAGNOSIS — Z8249 Family history of ischemic heart disease and other diseases of the circulatory system: Secondary | ICD-10-CM

## 2022-04-05 DIAGNOSIS — E1159 Type 2 diabetes mellitus with other circulatory complications: Secondary | ICD-10-CM

## 2022-04-05 DIAGNOSIS — E876 Hypokalemia: Secondary | ICD-10-CM

## 2022-04-05 DIAGNOSIS — R6520 Severe sepsis without septic shock: Secondary | ICD-10-CM | POA: Diagnosis present

## 2022-04-05 DIAGNOSIS — Z818 Family history of other mental and behavioral disorders: Secondary | ICD-10-CM

## 2022-04-05 DIAGNOSIS — N390 Urinary tract infection, site not specified: Secondary | ICD-10-CM | POA: Diagnosis present

## 2022-04-05 DIAGNOSIS — E8809 Other disorders of plasma-protein metabolism, not elsewhere classified: Secondary | ICD-10-CM | POA: Diagnosis present

## 2022-04-05 DIAGNOSIS — Z6834 Body mass index (BMI) 34.0-34.9, adult: Secondary | ICD-10-CM

## 2022-04-05 DIAGNOSIS — E872 Acidosis, unspecified: Secondary | ICD-10-CM | POA: Diagnosis present

## 2022-04-05 DIAGNOSIS — F319 Bipolar disorder, unspecified: Secondary | ICD-10-CM

## 2022-04-05 DIAGNOSIS — I5042 Chronic combined systolic (congestive) and diastolic (congestive) heart failure: Secondary | ICD-10-CM | POA: Diagnosis present

## 2022-04-05 DIAGNOSIS — E1165 Type 2 diabetes mellitus with hyperglycemia: Secondary | ICD-10-CM | POA: Diagnosis present

## 2022-04-05 DIAGNOSIS — Z794 Long term (current) use of insulin: Secondary | ICD-10-CM

## 2022-04-05 DIAGNOSIS — G9341 Metabolic encephalopathy: Secondary | ICD-10-CM | POA: Diagnosis present

## 2022-04-05 DIAGNOSIS — G4730 Sleep apnea, unspecified: Secondary | ICD-10-CM | POA: Diagnosis present

## 2022-04-05 LAB — CBG MONITORING, ED: Glucose-Capillary: 387 mg/dL — ABNORMAL HIGH (ref 70–99)

## 2022-04-05 LAB — URINALYSIS, ROUTINE W REFLEX MICROSCOPIC
Bilirubin Urine: NEGATIVE
Glucose, UA: >=500 mg/dL — AB
Ketones, ur: 5 mg/dL — AB
Nitrite: POSITIVE — AB
Protein, ur: 30 mg/dL — AB
Specific Gravity, Urine: 1.007 (ref 1.005–1.030)
pH: 6 (ref 5.0–8.0)

## 2022-04-05 LAB — CBC
HCT: 37 % (ref 36.0–46.0)
Hemoglobin: 12.4 g/dL (ref 12.0–15.0)
MCH: 27 pg (ref 26.0–34.0)
MCHC: 33.5 g/dL (ref 30.0–36.0)
MCV: 80.6 fL (ref 80.0–100.0)
Platelets: 186 10*3/uL (ref 150–400)
RBC: 4.59 MIL/uL (ref 3.87–5.11)
RDW: 14.6 % (ref 11.5–15.5)
WBC: 14.2 10*3/uL — ABNORMAL HIGH (ref 4.0–10.5)
nRBC: 0 % (ref 0.0–0.2)

## 2022-04-05 LAB — COMPREHENSIVE METABOLIC PANEL
ALT: 20 U/L (ref 0–44)
AST: 17 U/L (ref 15–41)
Albumin: 3.3 g/dL — ABNORMAL LOW (ref 3.5–5.0)
Alkaline Phosphatase: 103 U/L (ref 38–126)
Anion gap: 9 (ref 5–15)
BUN: 10 mg/dL (ref 6–20)
CO2: 17 mmol/L — ABNORMAL LOW (ref 22–32)
Calcium: 8.6 mg/dL — ABNORMAL LOW (ref 8.9–10.3)
Chloride: 103 mmol/L (ref 98–111)
Creatinine, Ser: 0.87 mg/dL (ref 0.44–1.00)
GFR, Estimated: >60 mL/min (ref >60)
Glucose, Bld: 384 mg/dL — ABNORMAL HIGH (ref 70–99)
Potassium: 3.2 mmol/L — ABNORMAL LOW (ref 3.5–5.1)
Sodium: 129 mmol/L — ABNORMAL LOW (ref 135–145)
Total Bilirubin: 1.2 mg/dL (ref 0.3–1.2)
Total Protein: 7.2 g/dL (ref 6.5–8.1)

## 2022-04-05 LAB — PROTIME-INR
INR: 1.2 (ref 0.8–1.2)
Prothrombin Time: 15.4 seconds — ABNORMAL HIGH (ref 11.4–15.2)

## 2022-04-05 LAB — LACTIC ACID, PLASMA: Lactic Acid, Venous: 1.4 mmol/L (ref 0.5–1.9)

## 2022-04-05 LAB — APTT: aPTT: 31 seconds (ref 24–36)

## 2022-04-05 MED ORDER — SODIUM CHLORIDE 0.9 % IV BOLUS (SEPSIS)
1000.0000 mL | Freq: Once | INTRAVENOUS | Status: AC
  Administered 2022-04-05: 1000 mL via INTRAVENOUS

## 2022-04-05 MED ORDER — SODIUM CHLORIDE 0.9 % IV SOLN
2.0000 g | INTRAVENOUS | Status: DC
  Administered 2022-04-05 – 2022-04-08 (×4): 2 g via INTRAVENOUS
  Filled 2022-04-05 (×4): qty 20

## 2022-04-05 MED ORDER — LACTATED RINGERS IV SOLN: INTRAVENOUS | Status: DC

## 2022-04-05 NOTE — Sepsis Progress Note (Signed)
Monitoring for the code sepsis protocol. °

## 2022-04-05 NOTE — ED Provider Notes (Signed)
?  Provider Note ?MRN:  [id number]  ?Arrival date & time: 04/06/22    ?ED Course and Medical Decision Making  ?Assumed care from Dr. Sabra Heck at shift change. ? ?Some abnormal behavior/confusion recently and now with fever, tachycardia, suspect urosepsis.  Awaiting work-up. ? ?.Critical Care ?Performed by: Maudie Flakes, MD ?Authorized by: Maudie Flakes, MD  ? ?Critical care provider statement:  ?  Critical care time (minutes):  33 ?  Critical care was necessary to treat or prevent imminent or life-threatening deterioration of the following conditions:  Sepsis ?  Critical care was time spent personally by me on the following activities:  Development of treatment plan with patient or surrogate, discussions with consultants, evaluation of patient's response to treatment, examination of patient, ordering and review of laboratory studies, ordering and review of radiographic studies, ordering and performing treatments and interventions, pulse oximetry, re-evaluation of patient's condition and review of old charts ? ?Urinalysis suspicious for infection.  Admitting to medicine for further care. ? ?Final Clinical Impressions(s) / ED Diagnoses  ? ?  ICD-10-CM   ?1. Sepsis, due to unspecified organism, unspecified whether acute organ dysfunction present ([place])  A41.9   ?  ?  ?ED Discharge Orders   ? ? None  ? ?  ?  ?Discharge Instructions   ?None ?  ? ? ?Barth Kirks. Sedonia Small, MD ?[facility] Emergency Medicine ?[place] ?[email or web address] ? ?  ?Maudie Flakes, MD ?04/06/22 [phone] ? ?

## 2022-04-05 NOTE — ED Provider Notes (Signed)
?[facility] ?Provider Note ? ? ?CSN: [id number] ?Arrival date & time: 04/05/22  2139 ? ?  ? ?History ? ?Chief Complaint  ?Patient presents with  ? Altered Mental Status  ? ? ?Yolanda [name] is a 51 y.o. female. ? ? ?Altered Mental Status ? ?This patient is a 51 year old female, she has a history of insulin requiring diabetes, she is also on clonazepam, Entresto, Myrbetriq, she takes Lyrica, vitamin D and Geodon.  She is also on spironolactone.  She presents to the hospital tonight in the care of her husband, he reports that she was last seen normal a couple of days ago when he asked her to come pick him up from work, she was driving all over the place and not able to find him.  She ended up 20 miles away and seemingly very confused.  This confusion has worsened over the last couple of days and he states that she has had urinary incontinence all over the house.  She has not recently had any diarrhea and has not been short of breath.  Presenting with altered mental status this evening.  The patient was noted to be febrile on arrival to 103.2, heart rate of 145 and a blood pressure of 116/89, the patient is not able to give me any other information but does agree with what the husband is saying.  She seems to be saying the same things over and over ? ?Home Medications ?Prior to Admission medications   ?Medication Sig Start Date End Date Taking? Authorizing Provider  ?metroNIDAZOLE (METROGEL VAGINAL) 0.75 % vaginal gel Place 1 Applicatorful vaginally at bedtime. ?Patient not taking: Reported on 12/13/2021 05/17/21   Estill Dooms, NP  ?acetaminophen (TYLENOL) 500 MG tablet Take 1,500 mg by mouth every 6 (six) hours as needed.    [provider]  ?aspirin EC 81 MG EC tablet Take 1 tablet (81 mg total) by mouth daily. Swallow whole. 09/23/20   Charlynne Cousins, MD  ?atorvastatin (LIPITOR) 40 MG tablet Take 40 mg by mouth at bedtime. 06/21/20   [provider]  ?Boric Acid  Vaginal 600 MG SUPP Place 600 mg vaginally at bedtime. X 2 weeks. DO NOT TAKE BY MOUTH. 07/19/21   Roma Schanz, CNM  ?clonazePAM (KLONOPIN) 0.5 MG tablet Take 0.5 mg by mouth 2 (two) times daily as needed for anxiety.  07/04/20   [provider]  ?ENTRESTO 49-51 MG TAKE ONE TABLET BY MOUTH TWICE A DAY 01/08/22   Arnoldo Lenis, MD  ?glucose blood [facility] VERIO) test strip Test glucose 5 times a day 11/10/20   Cassandria Anger, MD  ?guaiFENesin (MUCINEX) 600 MG 12 hr tablet Take 1 tablet (600 mg total) by mouth 2 (two) times daily. 12/20/21   Volney American, PA-C  ?HUMALOG 100 UNIT/ML injection INJECT 10-16 UNITS TOTAL (0.1 - 0.16 MLSSUBCUTANEOUSLY INTO THE SKIN 3 TIMES A DAY 03/15/21   Cassandria Anger, MD  ?insulin detemir (LEVEMIR) 100 UNIT/ML FlexPen Inject 80 Units into the skin at bedtime. ?Patient taking differently: Inject 50 Units into the skin 2 (two) times daily. Spoke to patient by phone and she states that she was taking Levemir 50 units twice a day 06/29/21   Brita Romp, NP  ?losartan (COZAAR) 25 MG tablet Take 25 mg by mouth daily.    [provider]  ?metFORMIN (GLUCOPHAGE) 1000 MG tablet Take 1 tablet (1,000 mg total) by mouth 2 (two) times daily with a meal. 09/27/20  Cassandria Anger, MD  ?methocarbamol (ROBAXIN) 500 MG tablet Take 500 mg by mouth 3 (three) times daily. 02/14/21   [provider]  ?metoprolol succinate (TOPROL-XL) 50 MG 24 hr tablet Take 1.5 tablets (75 mg total) by mouth daily. Take with or immediately following a meal. 04/02/21 12/28/21  Imogene Burn, PA-C  ?metroNIDAZOLE (FLAGYL) 500 MG tablet Take 1 tablet (500 mg total) by mouth 2 (two) times daily. ?Patient not taking: Reported on 05/14/2021 04/17/21   Estill Dooms, NP  ?MYRBETRIQ 50 MG TB24 tablet TAKE ONE TABLET (50MG TOTAL) BY MOUTH DAILY AT BEDTIME ?Patient taking differently: Take 50 mg by mouth at bedtime. 08/20/21   Florian Buff, MD  ?nystatin  (MYCOSTATIN/NYSTOP) powder Apply topically 3 (three) times daily. 12/15/21   Charlynne Cousins, MD  ?omeprazole (PRILOSEC) 40 MG capsule Take 40 mg by mouth daily. 08/10/20   [provider]  ?Oxycodone HCl 10 MG TABS Take 10 mg by mouth every 6 (six) hours as needed for pain. 06/10/20   [provider]  ?OZEMPIC, 0.25 OR 0.5 MG/DOSE, 2 MG/1.5ML SOPN Inject 0.5 mg into the skin once a week. Sunday 07/04/20   [provider]  ?PARoxetine (PAXIL) 30 MG tablet Take 60 mg by mouth at bedtime.  06/21/20   [provider]  ?pregabalin (LYRICA) 300 MG capsule Take 300 mg by mouth every 12 (twelve) hours. 06/03/20   [provider]  ?spironolactone (ALDACTONE) 25 MG tablet Take 0.5 tablets (12.5 mg total) by mouth daily. 09/23/20   Charlynne Cousins, MD  ?TROKENDI XR 200 MG CP24 Take 200 mg by mouth daily.  07/04/20   [provider]  ?Vitamin D, Ergocalciferol, (DRISDOL) 1.25 MG (50000 UNIT) CAPS capsule Take 1 capsule (50,000 Units total) by mouth every 7 (seven) days. ?Patient not taking: Reported on 12/13/2021 03/13/21   Cassandria Anger, MD  ?ziprasidone (GEODON) 80 MG capsule Take 80 mg by mouth every evening. 11/06/21   [provider]  ?   ? ?Allergies    ?Abilify [aripiprazole] and Sulfa antibiotics   ? ?Review of Systems   ?Review of Systems  ?Unable to perform ROS: Acuity of condition  ? ?Physical Exam ?Updated Vital Signs ?BP 107/65   Pulse (!) 122   Temp (!) 103.2 ?F (39.6 ?C) (Oral)   Resp (!) 30   Ht 1.702 m (5' 7" )   Wt 99.8 kg   LMP 03/29/2022 (Approximate)   SpO2 93%   BMI 34.46 kg/m?  ?Physical Exam ?Vitals and nursing note reviewed.  ?Constitutional:   ?   General: She is in acute distress.  ?   Appearance: She is well-developed. She is ill-appearing.  ?HENT:  ?   Head: Normocephalic and atraumatic.  ?   Mouth/Throat:  ?   Pharynx: No oropharyngeal exudate.  ?Eyes:  ?   General: No scleral icterus.    ?   Right eye: No discharge.     ?    Left eye: No discharge.  ?   Conjunctiva/sclera: Conjunctivae normal.  ?   Pupils: Pupils are equal, round, and reactive to light.  ?Neck:  ?   Thyroid: No thyromegaly.  ?   Vascular: No JVD.  ?Cardiovascular:  ?   Rate and Rhythm: Regular rhythm. Tachycardia present.  ?   Heart sounds: Normal heart sounds. No murmur heard. ?  No friction rub. No gallop.  ?Pulmonary:  ?   Effort: Pulmonary effort is normal. No respiratory distress.  ?  Breath sounds: Normal breath sounds. No wheezing or rales.  ?Abdominal:  ?   General: Bowel sounds are normal. There is no distension.  ?   Palpations: Abdomen is soft. There is no mass.  ?   Tenderness: There is no abdominal tenderness.  ?Musculoskeletal:     ?   General: No tenderness. Normal range of motion.  ?   Cervical back: Normal range of motion and neck supple.  ?   Right lower leg: No edema.  ?   Left lower leg: No edema.  ?Lymphadenopathy:  ?   Cervical: No cervical adenopathy.  ?Skin: ?   General: Skin is warm and dry.  ?   Findings: No erythema or rash.  ?Neurological:  ?   Mental Status: She is alert.  ?   Coordination: Coordination normal.  ?   Comments: The patient is able to move all 4 extremities, she is able to squeeze my hands with both hands, she is able to do finger-nose-finger, she is able to lift both legs, she does not have any obvious facial droop and her speech seems to be clear though it is somewhat slowed and sometimes she is answering questions with incorrect answers or having flights of ideas and tangential thought, other times she is very quiet  ?Psychiatric:     ?   Behavior: Behavior normal.  ? ? ?ED Results / Procedures / Treatments   ?Labs ?(all labs ordered are listed, but only abnormal results are displayed) ?Labs Reviewed  ?COMPREHENSIVE METABOLIC PANEL - Abnormal; Notable for the following components:  ?    Result Value  ? Sodium 129 (*)   ? Potassium 3.2 (*)   ? CO2 17 (*)   ? Glucose, Bld 384 (*)   ? Calcium 8.6 (*)   ? Albumin 3.3 (*)   ?  All other components within normal limits  ?CBC - Abnormal; Notable for the following components:  ? WBC 14.2 (*)   ? All other components within normal limits  ?PROTIME-INR - Abnormal; Notable for the following component

## 2022-04-05 NOTE — ED Triage Notes (Signed)
Pt arrived via POV with altered mental status. Pt presents confused to self, place and situation. Pt unable to tell RN what hospital she is in or what year it is. Pts spouse reports Pt has been walking off and getting lost. Pts LKW this past Wednesday.  ?

## 2022-04-06 ENCOUNTER — Inpatient Hospital Stay (HOSPITAL_COMMUNITY): Payer: Medicare Other

## 2022-04-06 DIAGNOSIS — A419 Sepsis, unspecified organism: Secondary | ICD-10-CM | POA: Diagnosis present

## 2022-04-06 DIAGNOSIS — E872 Acidosis, unspecified: Secondary | ICD-10-CM | POA: Diagnosis present

## 2022-04-06 DIAGNOSIS — R410 Disorientation, unspecified: Secondary | ICD-10-CM | POA: Diagnosis not present

## 2022-04-06 DIAGNOSIS — R739 Hyperglycemia, unspecified: Secondary | ICD-10-CM

## 2022-04-06 DIAGNOSIS — F319 Bipolar disorder, unspecified: Secondary | ICD-10-CM | POA: Diagnosis present

## 2022-04-06 DIAGNOSIS — E441 Mild protein-calorie malnutrition: Secondary | ICD-10-CM | POA: Diagnosis present

## 2022-04-06 DIAGNOSIS — N10 Acute tubulo-interstitial nephritis: Secondary | ICD-10-CM | POA: Diagnosis present

## 2022-04-06 DIAGNOSIS — I110 Hypertensive heart disease with heart failure: Secondary | ICD-10-CM | POA: Diagnosis present

## 2022-04-06 DIAGNOSIS — I5032 Chronic diastolic (congestive) heart failure: Secondary | ICD-10-CM | POA: Diagnosis not present

## 2022-04-06 DIAGNOSIS — Z20822 Contact with and (suspected) exposure to covid-19: Secondary | ICD-10-CM | POA: Diagnosis present

## 2022-04-06 DIAGNOSIS — I5022 Chronic systolic (congestive) heart failure: Secondary | ICD-10-CM | POA: Diagnosis not present

## 2022-04-06 DIAGNOSIS — E8809 Other disorders of plasma-protein metabolism, not elsewhere classified: Secondary | ICD-10-CM

## 2022-04-06 DIAGNOSIS — R32 Unspecified urinary incontinence: Secondary | ICD-10-CM | POA: Diagnosis present

## 2022-04-06 DIAGNOSIS — R6520 Severe sepsis without septic shock: Secondary | ICD-10-CM | POA: Diagnosis present

## 2022-04-06 DIAGNOSIS — E46 Unspecified protein-calorie malnutrition: Secondary | ICD-10-CM

## 2022-04-06 DIAGNOSIS — I2510 Atherosclerotic heart disease of native coronary artery without angina pectoris: Secondary | ICD-10-CM | POA: Diagnosis present

## 2022-04-06 DIAGNOSIS — G4730 Sleep apnea, unspecified: Secondary | ICD-10-CM | POA: Diagnosis present

## 2022-04-06 DIAGNOSIS — F603 Borderline personality disorder: Secondary | ICD-10-CM | POA: Diagnosis present

## 2022-04-06 DIAGNOSIS — E782 Mixed hyperlipidemia: Secondary | ICD-10-CM | POA: Diagnosis present

## 2022-04-06 DIAGNOSIS — Z794 Long term (current) use of insulin: Secondary | ICD-10-CM | POA: Diagnosis not present

## 2022-04-06 DIAGNOSIS — K219 Gastro-esophageal reflux disease without esophagitis: Secondary | ICD-10-CM | POA: Diagnosis present

## 2022-04-06 DIAGNOSIS — E669 Obesity, unspecified: Secondary | ICD-10-CM | POA: Diagnosis present

## 2022-04-06 DIAGNOSIS — E1165 Type 2 diabetes mellitus with hyperglycemia: Secondary | ICD-10-CM | POA: Diagnosis present

## 2022-04-06 DIAGNOSIS — E876 Hypokalemia: Secondary | ICD-10-CM

## 2022-04-06 DIAGNOSIS — I10 Essential (primary) hypertension: Secondary | ICD-10-CM | POA: Diagnosis not present

## 2022-04-06 DIAGNOSIS — R197 Diarrhea, unspecified: Secondary | ICD-10-CM | POA: Diagnosis present

## 2022-04-06 DIAGNOSIS — G9341 Metabolic encephalopathy: Secondary | ICD-10-CM | POA: Diagnosis present

## 2022-04-06 DIAGNOSIS — I5042 Chronic combined systolic (congestive) and diastolic (congestive) heart failure: Secondary | ICD-10-CM | POA: Diagnosis present

## 2022-04-06 DIAGNOSIS — R7989 Other specified abnormal findings of blood chemistry: Secondary | ICD-10-CM

## 2022-04-06 DIAGNOSIS — N390 Urinary tract infection, site not specified: Secondary | ICD-10-CM

## 2022-04-06 DIAGNOSIS — Z79899 Other long term (current) drug therapy: Secondary | ICD-10-CM | POA: Diagnosis not present

## 2022-04-06 DIAGNOSIS — R4182 Altered mental status, unspecified: Secondary | ICD-10-CM

## 2022-04-06 DIAGNOSIS — M5136 Other intervertebral disc degeneration, lumbar region: Secondary | ICD-10-CM | POA: Diagnosis present

## 2022-04-06 LAB — COMPREHENSIVE METABOLIC PANEL
ALT: 15 U/L (ref 0–44)
AST: 14 U/L — ABNORMAL LOW (ref 15–41)
Albumin: 2.6 g/dL — ABNORMAL LOW (ref 3.5–5.0)
Alkaline Phosphatase: 81 U/L (ref 38–126)
Anion gap: 4 — ABNORMAL LOW (ref 5–15)
BUN: 9 mg/dL (ref 6–20)
CO2: 18 mmol/L — ABNORMAL LOW (ref 22–32)
Calcium: 7.6 mg/dL — ABNORMAL LOW (ref 8.9–10.3)
Chloride: 111 mmol/L (ref 98–111)
Creatinine, Ser: 0.77 mg/dL (ref 0.44–1.00)
GFR, Estimated: >60 mL/min (ref >60)
Glucose, Bld: 336 mg/dL — ABNORMAL HIGH (ref 70–99)
Potassium: 3 mmol/L — ABNORMAL LOW (ref 3.5–5.1)
Sodium: 133 mmol/L — ABNORMAL LOW (ref 135–145)
Total Bilirubin: 0.8 mg/dL (ref 0.3–1.2)
Total Protein: 5.6 g/dL — ABNORMAL LOW (ref 6.5–8.1)

## 2022-04-06 LAB — CBC
HCT: 31 % — ABNORMAL LOW (ref 36.0–46.0)
Hemoglobin: 10.2 g/dL — ABNORMAL LOW (ref 12.0–15.0)
MCH: 27 pg (ref 26.0–34.0)
MCHC: 32.9 g/dL (ref 30.0–36.0)
MCV: 82 fL (ref 80.0–100.0)
Platelets: 145 10*3/uL — ABNORMAL LOW (ref 150–400)
RBC: 3.78 MIL/uL — ABNORMAL LOW (ref 3.87–5.11)
RDW: 14.7 % (ref 11.5–15.5)
WBC: 11.8 10*3/uL — ABNORMAL HIGH (ref 4.0–10.5)
nRBC: 0 % (ref 0.0–0.2)

## 2022-04-06 LAB — RESP PANEL BY RT-PCR (FLU A&B, COVID) ARPGX2
Influenza A by PCR: NEGATIVE
Influenza B by PCR: NEGATIVE
SARS Coronavirus 2 by RT PCR: NEGATIVE

## 2022-04-06 LAB — GLUCOSE, CAPILLARY
Glucose-Capillary: 314 mg/dL — ABNORMAL HIGH (ref 70–99)
Glucose-Capillary: 372 mg/dL — ABNORMAL HIGH (ref 70–99)
Glucose-Capillary: 179 mg/dL — ABNORMAL HIGH (ref 70–99)
Glucose-Capillary: 171 mg/dL — ABNORMAL HIGH (ref 70–99)

## 2022-04-06 LAB — LACTIC ACID, PLASMA
Lactic Acid, Venous: 2.7 mmol/L (ref 0.5–1.9)
Lactic Acid, Venous: 2.8 mmol/L (ref 0.5–1.9)

## 2022-04-06 LAB — RAPID URINE DRUG SCREEN, HOSP PERFORMED
Amphetamines: NOT DETECTED
Barbiturates: NOT DETECTED
Benzodiazepines: NOT DETECTED
Cocaine: NOT DETECTED
Opiates: NOT DETECTED
Tetrahydrocannabinol: NOT DETECTED

## 2022-04-06 LAB — PHOSPHORUS: Phosphorus: 2 mg/dL — ABNORMAL LOW (ref 2.5–4.6)

## 2022-04-06 LAB — MRSA NEXT GEN BY PCR, NASAL: MRSA by PCR Next Gen: NOT DETECTED

## 2022-04-06 LAB — PREGNANCY, URINE: Preg Test, Ur: NEGATIVE

## 2022-04-06 LAB — MAGNESIUM: Magnesium: 1.2 mg/dL — ABNORMAL LOW (ref 1.7–2.4)

## 2022-04-06 MED ORDER — IOHEXOL 9 MG/ML PO SOLN
ORAL | Status: AC
  Filled 2022-04-06: qty 1000

## 2022-04-06 MED ORDER — ACETAMINOPHEN 650 MG RE SUPP: 650.0000 mg | Freq: Four times a day (QID) | RECTAL | Status: DC | PRN

## 2022-04-06 MED ORDER — GLUCERNA SHAKE PO LIQD
237.0000 mL | Freq: Three times a day (TID) | ORAL | Status: DC
  Administered 2022-04-07: 237 mL via ORAL
  Filled 2022-04-06 (×5): qty 237

## 2022-04-06 MED ORDER — METOPROLOL SUCCINATE ER 50 MG PO TB24
50.0000 mg | ORAL_TABLET | Freq: Every day | ORAL | Status: DC
  Administered 2022-04-07 – 2022-04-11 (×5): 50 mg via ORAL
  Filled 2022-04-06 (×5): qty 1

## 2022-04-06 MED ORDER — PAROXETINE HCL 20 MG PO TABS
60.0000 mg | ORAL_TABLET | Freq: Every day | ORAL | Status: DC
  Administered 2022-04-07 – 2022-04-10 (×5): 60 mg via ORAL
  Filled 2022-04-06 (×6): qty 3

## 2022-04-06 MED ORDER — ZIPRASIDONE HCL 40 MG PO CAPS
80.0000 mg | ORAL_CAPSULE | Freq: Every evening | ORAL | Status: DC
  Administered 2022-04-06 – 2022-04-10 (×5): 80 mg via ORAL
  Filled 2022-04-06 (×6): qty 2

## 2022-04-06 MED ORDER — ASPIRIN EC 81 MG PO TBEC
81.0000 mg | DELAYED_RELEASE_TABLET | Freq: Every day | ORAL | Status: DC
  Administered 2022-04-06 – 2022-04-11 (×6): 81 mg via ORAL
  Filled 2022-04-06 (×6): qty 1

## 2022-04-06 MED ORDER — INSULIN ASPART 100 UNIT/ML IJ SOLN
0.0000 [IU] | Freq: Every day | INTRAMUSCULAR | Status: DC
  Administered 2022-04-07: 2 [IU] via SUBCUTANEOUS

## 2022-04-06 MED ORDER — CLONAZEPAM 0.5 MG PO TABS
0.5000 mg | ORAL_TABLET | Freq: Two times a day (BID) | ORAL | Status: DC | PRN
  Administered 2022-04-09: 0.5 mg via ORAL
  Filled 2022-04-06 (×2): qty 1

## 2022-04-06 MED ORDER — IOHEXOL 300 MG/ML  SOLN
100.0000 mL | Freq: Once | INTRAMUSCULAR | Status: AC | PRN
  Administered 2022-04-06: 100 mL via INTRAVENOUS

## 2022-04-06 MED ORDER — IBUPROFEN 600 MG PO TABS
600.0000 mg | ORAL_TABLET | Freq: Four times a day (QID) | ORAL | Status: DC | PRN
  Administered 2022-04-06 – 2022-04-07 (×2): 600 mg via ORAL
  Filled 2022-04-06 (×2): qty 2

## 2022-04-06 MED ORDER — ALUM & MAG HYDROXIDE-SIMETH 200-200-20 MG/5ML PO SUSP
30.0000 mL | Freq: Four times a day (QID) | ORAL | Status: DC | PRN
  Filled 2022-04-06: qty 30

## 2022-04-06 MED ORDER — POTASSIUM CHLORIDE CRYS ER 20 MEQ PO TBCR
40.0000 meq | EXTENDED_RELEASE_TABLET | Freq: Once | ORAL | Status: AC
  Administered 2022-04-06: 40 meq via ORAL
  Filled 2022-04-06: qty 2

## 2022-04-06 MED ORDER — ZIPRASIDONE HCL 80 MG PO CAPS
80.0000 mg | ORAL_CAPSULE | Freq: Every evening | ORAL | Status: DC
  Filled 2022-04-06 (×2): qty 1

## 2022-04-06 MED ORDER — INSULIN ASPART 100 UNIT/ML IJ SOLN
4.0000 [IU] | Freq: Three times a day (TID) | INTRAMUSCULAR | Status: DC
  Administered 2022-04-06 – 2022-04-08 (×9): 4 [IU] via SUBCUTANEOUS

## 2022-04-06 MED ORDER — ACETAMINOPHEN 500 MG PO TABS
1000.0000 mg | ORAL_TABLET | Freq: Once | ORAL | Status: AC
  Administered 2022-04-06: 1000 mg via ORAL
  Filled 2022-04-06: qty 2

## 2022-04-06 MED ORDER — ATORVASTATIN CALCIUM 40 MG PO TABS
40.0000 mg | ORAL_TABLET | Freq: Every day | ORAL | Status: DC
  Administered 2022-04-07 – 2022-04-10 (×5): 40 mg via ORAL
  Filled 2022-04-06 (×6): qty 1

## 2022-04-06 MED ORDER — POTASSIUM CHLORIDE CRYS ER 20 MEQ PO TBCR
40.0000 meq | EXTENDED_RELEASE_TABLET | Freq: Once | ORAL | Status: AC
Start: 2022-04-06 — End: 2022-04-06
  Administered 2022-04-06: 40 meq via ORAL
  Filled 2022-04-06: qty 2

## 2022-04-06 MED ORDER — INSULIN DETEMIR 100 UNIT/ML ~~LOC~~ SOLN
10.0000 [IU] | Freq: Two times a day (BID) | SUBCUTANEOUS | Status: DC
  Administered 2022-04-06 – 2022-04-08 (×5): 10 [IU] via SUBCUTANEOUS
  Filled 2022-04-06 (×7): qty 0.1

## 2022-04-06 MED ORDER — INSULIN ASPART 100 UNIT/ML IJ SOLN
0.0000 [IU] | Freq: Three times a day (TID) | INTRAMUSCULAR | Status: DC
  Administered 2022-04-06: 20 [IU] via SUBCUTANEOUS
  Administered 2022-04-06: 15 [IU] via SUBCUTANEOUS
  Administered 2022-04-06 – 2022-04-07 (×2): 4 [IU] via SUBCUTANEOUS
  Administered 2022-04-07: 7 [IU] via SUBCUTANEOUS
  Administered 2022-04-07: 11 [IU] via SUBCUTANEOUS
  Administered 2022-04-08 (×2): 7 [IU] via SUBCUTANEOUS
  Administered 2022-04-08 – 2022-04-09 (×3): 4 [IU] via SUBCUTANEOUS
  Administered 2022-04-09: 7 [IU] via SUBCUTANEOUS
  Administered 2022-04-10: 4 [IU] via SUBCUTANEOUS
  Administered 2022-04-10: 7 [IU] via SUBCUTANEOUS
  Administered 2022-04-10 – 2022-04-11 (×3): 4 [IU] via SUBCUTANEOUS

## 2022-04-06 MED ORDER — ENOXAPARIN SODIUM 40 MG/0.4ML IJ SOSY
40.0000 mg | PREFILLED_SYRINGE | INTRAMUSCULAR | Status: DC
  Administered 2022-04-06 – 2022-04-11 (×6): 40 mg via SUBCUTANEOUS
  Filled 2022-04-06 (×6): qty 0.4

## 2022-04-06 MED ORDER — LACTATED RINGERS IV SOLN: INTRAVENOUS | Status: DC

## 2022-04-06 MED ORDER — MAGNESIUM SULFATE 2 GM/50ML IV SOLN
2.0000 g | Freq: Once | INTRAVENOUS | Status: AC
Start: 2022-04-06 — End: 2022-04-06
  Administered 2022-04-06: 2 g via INTRAVENOUS
  Filled 2022-04-06: qty 50

## 2022-04-06 MED ORDER — CHLORHEXIDINE GLUCONATE CLOTH 2 % EX PADS
6.0000 | MEDICATED_PAD | Freq: Every day | CUTANEOUS | Status: DC
  Administered 2022-04-06 – 2022-04-11 (×6): 6 via TOPICAL

## 2022-04-06 MED ORDER — METOPROLOL SUCCINATE ER 50 MG PO TB24
75.0000 mg | ORAL_TABLET | Freq: Every day | ORAL | Status: DC
  Administered 2022-04-06: 75 mg via ORAL
  Filled 2022-04-06: qty 1

## 2022-04-06 MED ORDER — PANTOPRAZOLE SODIUM 40 MG PO TBEC
40.0000 mg | DELAYED_RELEASE_TABLET | Freq: Every day | ORAL | Status: DC
  Administered 2022-04-06 – 2022-04-11 (×6): 40 mg via ORAL
  Filled 2022-04-06 (×6): qty 1

## 2022-04-06 MED ORDER — ACETAMINOPHEN 325 MG PO TABS
650.0000 mg | ORAL_TABLET | Freq: Four times a day (QID) | ORAL | Status: DC | PRN
  Administered 2022-04-06 – 2022-04-10 (×11): 650 mg via ORAL
  Filled 2022-04-06 (×11): qty 2

## 2022-04-06 NOTE — ED Notes (Addendum)
Patient is now more alert and able to have small conversation in comparison to her initial arrival to the ED. ?
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
## 2022-04-06 NOTE — Progress Notes (Signed)
?  PROGRESS NOTE ? ?Checked in on patient this afternoon, patient is alert and oriented.  Her heart rate is much better, currently in normal sinus rhythm rate 98.  She remains on room air satting 95%.  Afebrile currently, has been getting Tylenol and ibuprofen.  She is hypotensive with SBP 82 (likely due to receiving Toradol earlier today).  She is asymptomatic, denies any lightheadedness or dizziness.  She has gotten IV fluids per sepsis protocol, has history of CHF and remains on IVF.  Okay with MAP >60.  If remains persistently hypotensive, plan to start Levophed.  CT head and abdomen pelvis are pending result.  Discussed with bedside RN. ? ? ?Dessa Phi, DO ?[facility] ?04/06/2022, 4:22 PM ? ?Available via Epic secure chat 7am-7pm ?After these hours, please refer to coverage provider listed on [email or web address]  ? ?

## 2022-04-06 NOTE — Progress Notes (Addendum)
?  PROGRESS NOTE ? ?Patient admitted earlier this morning. See H&P.  ? ?Yolanda [name] is a 51 yo female with past medical history significant for type II DM, systolic CHF, bipolar disorder and seizure who presented to the hospital due to altered mental status.  Per report, when patient went to go pick up her husband from work 2 days ago, she got confused and was driving around, ended up about 20 miles away.  Also complained of onset of worsening urinary incontinence and burning with urination.  In the emergency department, she was found to have fever 103.2, tachycardic and tachypneic with leukocytosis.  UA concerning for UTI.  Patient was started on IV fluid, Rocephin. ? ?On examination, patient is alert and oriented to self, place, year, but a poor historian overall.  Her main complaint is abdominal pain.  She had liquid stool x1 per bedside nurse.  She remains tachycardic, tachypneic.  She denies any chest pain.  Admits to some shortness of breath. Remains on room air.  ? ?Blood culture and urine culture are pending ?Continue IV fluid ?Continue IV Rocephin ?Continue Tylenol, ibuprofen for fever as needed ?Replace potassium, magnesium ?Check lactic acid  ?Check KUB with complaints of abdominal pain --> unremarkable. Check CT A/P  ?Check CT head with 2 day history of encephalopathy  ?If >3 loose stool, check stool PCR and C Diff  ? ?Updated mom and husband over the phone. ? ?Status is: Inpatient ?Remains inpatient appropriate because: IVF, IV rocephin  ? ? ?Dessa Phi, DO ?[facility] ?04/06/2022, 10:33 AM ? ?Available via Epic secure chat 7am-7pm ?After these hours, please refer to coverage provider listed on [email or web address]  ? ?

## 2022-04-06 NOTE — Progress Notes (Signed)
Critical value for lactic was called. 2.7  Notified Dr. Maylene Roes of level.  ?

## 2022-04-06 NOTE — Progress Notes (Signed)
Around 2549 patient began having bed rattling shaking, sinus tachycardia with rate of 160. She ha done episode of emesis. She remained alert and oriented. Axillary temp of 99.6. Tylenol was given due to concern of spiking fever. Dr Maylene Roes was informed and came to see patient. Pt will remain Stepdown for now. Saniya Tranchina Annibelle Brazie,RN ?

## 2022-04-07 DIAGNOSIS — R6520 Severe sepsis without septic shock: Secondary | ICD-10-CM | POA: Diagnosis not present

## 2022-04-07 DIAGNOSIS — A419 Sepsis, unspecified organism: Secondary | ICD-10-CM | POA: Diagnosis not present

## 2022-04-07 DIAGNOSIS — N10 Acute tubulo-interstitial nephritis: Secondary | ICD-10-CM

## 2022-04-07 LAB — CBC
HCT: 29.4 % — ABNORMAL LOW (ref 36.0–46.0)
Hemoglobin: 9.8 g/dL — ABNORMAL LOW (ref 12.0–15.0)
MCH: 27.4 pg (ref 26.0–34.0)
MCHC: 33.3 g/dL (ref 30.0–36.0)
MCV: 82.1 fL (ref 80.0–100.0)
Platelets: 127 10*3/uL — ABNORMAL LOW (ref 150–400)
RBC: 3.58 MIL/uL — ABNORMAL LOW (ref 3.87–5.11)
RDW: 14.9 % (ref 11.5–15.5)
WBC: 6.8 10*3/uL (ref 4.0–10.5)
nRBC: 0 % (ref 0.0–0.2)

## 2022-04-07 LAB — BASIC METABOLIC PANEL
Anion gap: 5 (ref 5–15)
BUN: 8 mg/dL (ref 6–20)
CO2: 19 mmol/L — ABNORMAL LOW (ref 22–32)
Calcium: 8 mg/dL — ABNORMAL LOW (ref 8.9–10.3)
Chloride: 112 mmol/L — ABNORMAL HIGH (ref 98–111)
Creatinine, Ser: 0.63 mg/dL (ref 0.44–1.00)
GFR, Estimated: >60 mL/min (ref >60)
Glucose, Bld: 209 mg/dL — ABNORMAL HIGH (ref 70–99)
Potassium: 3.6 mmol/L (ref 3.5–5.1)
Sodium: 136 mmol/L (ref 135–145)

## 2022-04-07 LAB — GLUCOSE, CAPILLARY
Glucose-Capillary: 170 mg/dL — ABNORMAL HIGH (ref 70–99)
Glucose-Capillary: 261 mg/dL — ABNORMAL HIGH (ref 70–99)
Glucose-Capillary: 237 mg/dL — ABNORMAL HIGH (ref 70–99)
Glucose-Capillary: 234 mg/dL — ABNORMAL HIGH (ref 70–99)

## 2022-04-07 LAB — C DIFFICILE QUICK SCREEN W PCR REFLEX
C Diff antigen: NEGATIVE
C Diff interpretation: NOT DETECTED
C Diff toxin: NEGATIVE

## 2022-04-07 LAB — LACTIC ACID, PLASMA
Lactic Acid, Venous: 0.7 mmol/L (ref 0.5–1.9)
Lactic Acid, Venous: 1.1 mmol/L (ref 0.5–1.9)

## 2022-04-07 LAB — MAGNESIUM: Magnesium: 1.5 mg/dL — ABNORMAL LOW (ref 1.7–2.4)

## 2022-04-07 MED ORDER — POTASSIUM CHLORIDE CRYS ER 20 MEQ PO TBCR
40.0000 meq | EXTENDED_RELEASE_TABLET | Freq: Once | ORAL | Status: AC
  Administered 2022-04-07: 40 meq via ORAL
  Filled 2022-04-07: qty 2

## 2022-04-07 MED ORDER — MAGNESIUM SULFATE 2 GM/50ML IV SOLN
2.0000 g | Freq: Once | INTRAVENOUS | Status: AC
  Administered 2022-04-07: 2 g via INTRAVENOUS
  Filled 2022-04-07: qty 50

## 2022-04-07 NOTE — Progress Notes (Signed)
Paged Dr. Josephine Cables to ask about rechecking lactic acid.  ?

## 2022-04-07 NOTE — Progress Notes (Signed)
?PROGRESS NOTE ? ? ? ?SHAKIAH WESTER  JKD:[id number] DOB: 1970-12-12 DOA: 04/05/2022 ?PCP: Pablo Lawrence, NP  ? ?  ?Brief Narrative:  ?Yolanda [name] is a 51 yo female with past medical history significant for type II DM, systolic CHF, bipolar disorder and seizure who presented to the hospital due to altered mental status.  Per report, when patient went to go pick up her husband from work 2 days ago, she got confused and was driving around, ended up about 20 miles away.  Also complained of onset of worsening urinary incontinence and burning with urination.  In the emergency department, she was found to have fever 103.2, tachycardic and tachypneic with leukocytosis.  UA concerning for UTI.  Patient was started on IV fluid, Rocephin. ? ?New events last 24 hours / Subjective: ?Patient doing much better this morning.  Vital signs have significantly improved.  Still having fevers, Tmax 101.8 this morning.  No longer tachycardic, BP improved.  Patient remains stable on room air.  Still having diarrhea.  Has no physical complaints today. ? ?Assessment & Plan: ?  ?Principal Problem: ?  Severe sepsis ([place]) ?Active Problems: ?  Essential hypertension, benign ?  Bipolar disorder ([place]) ?  GERD (gastroesophageal reflux disease) ?  Mixed hyperlipidemia ?  Urinary tract infection, site not specified ?  Chronic diastolic CHF (congestive heart failure) ([place]) ?  Hyperglycemia ?  Sepsis secondary to UTI [facility]) ?  Altered mental status ?  Pseudohyponatremia ?  Hypokalemia ?  Hypoalbuminemia due to protein-calorie malnutrition ([place]) ?  Acute pyelonephritis ? ? ?Severe sepsis secondary to pyelonephritis ?-Sepsis present on admission, with encephalopathy ?-CT abdomen pelvis revealed left perinephric edema, suspicious for pyelonephritis ?-Blood cultures are negative to date ?-Urine culture is pending ?-Continue IV Rocephin ?-Improving, WBC and lactic acid improved ? ?Acute metabolic encephalopathy ?-Secondary to above ?-CT head negative  for acute intracranial abnormality ?-Improving ? ?Diarrhea ?-C. difficile negative ?-GI PCR pending ?-If this is negative, can try Imodium as needed for diarrhea ? ?Chronic diastolic CHF ?-Stop IV fluids ?-Does not appear to be fluid overloaded on examination ?-Entresto, Cozaar, Aldactone are on hold due to marginal BP with sepsis ?-Toprol was resumed due to persistent tachycardia ? ?Diabetes mellitus ?-Continue Levemir, NovoLog, sliding scale insulin ? ?Hypomagnesemia ?-Replace, trend ? ?Hypokalemia ?-Replace, trend ? ?CAD ?-Aspirin, Lipitor ? ?Mood disorder ?-Klonopin,  Paxil, Geodon  ? ? ?DVT prophylaxis:  ?enoxaparin (LOVENOX) injection 40 mg Start: 04/06/22 1000 ?SCDs Start: 04/06/22 0245 ? ?Code Status: Full ?Family Communication: None at bedside ?Disposition Plan:  ?Status is: Inpatient ?Remains inpatient appropriate because: IV antibiotics, cultures pending. Transfer out of stepdown unit  ? ?Antimicrobials:  ?Anti-infectives (From admission, onward)  ? ? Start     Dose/Rate Route Frequency Ordered Stop  ? 04/05/22 2215  cefTRIAXone (ROCEPHIN) 2 g in sodium chloride 0.9 % 100 mL IVPB       ? 2 g ?200 mL/hr over 30 Minutes Intravenous Every 24 hours 04/05/22 2210 04/12/22 2214  ? ?  ? ? ? ?Objective: ?Vitals:  ? 04/07/22 1116 04/07/22 1128 04/07/22 1129 04/07/22 1141  ?BP: 100/64     ?Pulse: 91  91 89  ?Resp: (!) 26  (!) 24 20  ?Temp:  98.2 ?F (36.8 ?C)    ?TempSrc:  Oral    ?SpO2: 97%  100% 100%  ?Weight:      ?Height:      ? ? ?Intake/Output Summary (Last 24 hours) at 04/07/2022 1201 ?Last data filed at  04/07/2022 0854 ?Gross per 24 hour  ?Intake 1161.67 ml  ?Output --  ?Net 1161.67 ml  ? ?Filed Weights  ? 04/05/22 2151  ?Weight: 99.8 kg  ? ? ?Examination:  ?General exam: Appears calm and comfortable  ?Respiratory system: Clear to auscultation. Respiratory effort normal. No respiratory distress. No conversational dyspnea.  ?Cardiovascular system: S1 & S2 heard, RRR. No murmurs. No pedal edema. ?Gastrointestinal  system: Abdomen is nondistended, soft and nontender. Normal bowel sounds heard. ?Central nervous system: Alert and oriented. No focal neurological deficits. Speech clear.  ?Extremities: Symmetric in appearance  ?Skin: No rashes, lesions or ulcers on exposed skin  ?Psychiatry: Judgement and insight appear normal. Mood & affect appropriate.  ? ?Data Reviewed: I have personally reviewed following labs and imaging studies ? ?CBC: ?Recent Labs  ?Lab 04/05/22 ?2215 04/06/22 ?9024 04/07/22 ?0973  ?WBC 14.2* 11.8* 6.8  ?HGB 12.4 10.2* 9.8*  ?HCT 37.0 31.0* 29.4*  ?MCV 80.6 82.0 82.1  ?PLT 186 145* 127*  ? ?Basic Metabolic Panel: ?Recent Labs  ?Lab 04/05/22 ?2215 04/06/22 ?5329 04/07/22 ?9242  ?NA 129* 133* 136  ?K 3.2* 3.0* 3.6  ?CL 103 111 112*  ?CO2 17* 18* 19*  ?GLUCOSE 384* 336* 209*  ?BUN 10 9 8   ?CREATININE 0.87 0.77 0.63  ?CALCIUM 8.6* 7.6* 8.0*  ?MG  --  1.2* 1.5*  ?PHOS  --  2.0*  --   ? ?GFR: ?Estimated Creatinine Clearance: 103.3 mL/min (by C-G formula based on SCr of 0.63 mg/dL). ?Liver Function Tests: ?Recent Labs  ?Lab 04/05/22 ?2215 04/06/22 ?0432  ?AST 17 14*  ?ALT 20 15  ?ALKPHOS 103 81  ?BILITOT 1.2 0.8  ?PROT 7.2 5.6*  ?ALBUMIN 3.3* 2.6*  ? ?No results for input(s): LIPASE, AMYLASE in the last 168 hours. ?No results for input(s): AMMONIA in the last 168 hours. ?Coagulation Profile: ?Recent Labs  ?Lab 04/05/22 ?2215  ?INR 1.2  ? ?Cardiac Enzymes: ?No results for input(s): CKTOTAL, CKMB, CKMBINDEX, TROPONINI in the last 168 hours. ?BNP (last 3 results) ?No results for input(s): PROBNP in the last 8760 hours. ?HbA1C: ?No results for input(s): HGBA1C in the last 72 hours. ?CBG: ?Recent Labs  ?Lab 04/06/22 ?1119 04/06/22 ?1548 04/06/22 ?2120 04/07/22 ?6834 04/07/22 ?1056  ?GLUCAP 372* 179* 171* 170* 261*  ? ?Lipid Profile: ?No results for input(s): CHOL, HDL, LDLCALC, TRIG, CHOLHDL, LDLDIRECT in the last 72 hours. ?Thyroid Function Tests: ?No results for input(s): TSH, T4TOTAL, FREET4, T3FREE, THYROIDAB in the  last 72 hours. ?Anemia Panel: ?No results for input(s): VITAMINB12, FOLATE, FERRITIN, TIBC, IRON, RETICCTPCT in the last 72 hours. ?Sepsis Labs: ?Recent Labs  ?Lab 04/06/22 ?1052 04/06/22 ?1220 04/07/22 ?0705 04/07/22 ?0933  ?LATICACIDVEN 2.7* 2.8* 0.7 1.1  ? ? ?Recent Results (from the past 240 hour(s))  ?Resp Panel by RT-PCR (Flu A&B, Covid) Nasopharyngeal Swab     Status: None  ? Collection Time: 04/05/22 10:10 PM  ? Specimen: Nasopharyngeal Swab; Nasopharyngeal(NP) swabs in vial transport medium  ?Result Value Ref Range Status  ? SARS Coronavirus 2 by RT PCR NEGATIVE NEGATIVE Final  ?  Comment: (NOTE) ?SARS-CoV-2 target nucleic acids are NOT DETECTED. ? ?The SARS-CoV-2 RNA is generally detectable in upper respiratory ?specimens during the acute phase of infection. The lowest ?concentration of SARS-CoV-2 viral copies this assay can detect is ?138 copies/mL. A negative result does not preclude SARS-Cov-2 ?infection and should not be used as the sole basis for treatment or ?other patient management decisions. A negative result may occur with  ?improper  specimen collection/handling, submission of specimen other ?than nasopharyngeal swab, presence of viral mutation(s) within the ?areas targeted by this assay, and inadequate number of viral ?copies(<138 copies/mL). A negative result must be combined with ?clinical observations, patient history, and epidemiological ?information. The expected result is Negative. ? ?Fact Sheet for Patients:  ?EntrepreneurPulse.com.au ? ?Fact Sheet for Healthcare Providers:  ?IncredibleEmployment.be ? ?This test is no t yet approved or cleared by the [place] FDA and  ?has been authorized for detection and/or diagnosis of SARS-CoV-2 by ?FDA under an Emergency Use Authorization (EUA). This EUA will remain  ?in effect (meaning this test can be used) for the duration of the ?COVID-19 declaration under Section 564(b)(1) of the Act, 21 ?U.S.C.section  360bbb-3(b)(1), unless the authorization is terminated  ?or revoked sooner.  ? ? ?  ? Influenza A by PCR NEGATIVE NEGATIVE Final  ? Influenza B by PCR NEGATIVE NEGATIVE Final  ?  Comment: (NOTE) ?[employer]

## 2022-04-07 NOTE — Progress Notes (Signed)
Report called and given to [facility], LPN on Dept. 210. Pt to be transported via WC to room 309. ?

## 2022-04-08 DIAGNOSIS — E782 Mixed hyperlipidemia: Secondary | ICD-10-CM

## 2022-04-08 DIAGNOSIS — E876 Hypokalemia: Secondary | ICD-10-CM

## 2022-04-08 DIAGNOSIS — A419 Sepsis, unspecified organism: Secondary | ICD-10-CM | POA: Diagnosis not present

## 2022-04-08 DIAGNOSIS — K219 Gastro-esophageal reflux disease without esophagitis: Secondary | ICD-10-CM

## 2022-04-08 DIAGNOSIS — I5032 Chronic diastolic (congestive) heart failure: Secondary | ICD-10-CM

## 2022-04-08 DIAGNOSIS — I10 Essential (primary) hypertension: Secondary | ICD-10-CM

## 2022-04-08 DIAGNOSIS — R739 Hyperglycemia, unspecified: Secondary | ICD-10-CM

## 2022-04-08 DIAGNOSIS — F319 Bipolar disorder, unspecified: Secondary | ICD-10-CM

## 2022-04-08 DIAGNOSIS — N10 Acute tubulo-interstitial nephritis: Secondary | ICD-10-CM

## 2022-04-08 LAB — CBC
HCT: 30.2 % — ABNORMAL LOW (ref 36.0–46.0)
Hemoglobin: 9.8 g/dL — ABNORMAL LOW (ref 12.0–15.0)
MCH: 26.7 pg (ref 26.0–34.0)
MCHC: 32.5 g/dL (ref 30.0–36.0)
MCV: 82.3 fL (ref 80.0–100.0)
Platelets: 123 10*3/uL — ABNORMAL LOW (ref 150–400)
RBC: 3.67 MIL/uL — ABNORMAL LOW (ref 3.87–5.11)
RDW: 15.4 % (ref 11.5–15.5)
WBC: 5.5 10*3/uL (ref 4.0–10.5)
nRBC: 0 % (ref 0.0–0.2)

## 2022-04-08 LAB — BASIC METABOLIC PANEL
Anion gap: 4 — ABNORMAL LOW (ref 5–15)
BUN: 7 mg/dL (ref 6–20)
CO2: 19 mmol/L — ABNORMAL LOW (ref 22–32)
Calcium: 8.1 mg/dL — ABNORMAL LOW (ref 8.9–10.3)
Chloride: 114 mmol/L — ABNORMAL HIGH (ref 98–111)
Creatinine, Ser: 0.63 mg/dL (ref 0.44–1.00)
GFR, Estimated: >60 mL/min (ref >60)
Glucose, Bld: 188 mg/dL — ABNORMAL HIGH (ref 70–99)
Potassium: 3.4 mmol/L — ABNORMAL LOW (ref 3.5–5.1)
Sodium: 137 mmol/L (ref 135–145)

## 2022-04-08 LAB — GASTROINTESTINAL PANEL BY PCR, STOOL (REPLACES STOOL CULTURE)

## 2022-04-08 LAB — GLUCOSE, CAPILLARY
Glucose-Capillary: 249 mg/dL — ABNORMAL HIGH (ref 70–99)
Glucose-Capillary: 154 mg/dL — ABNORMAL HIGH (ref 70–99)
Glucose-Capillary: 165 mg/dL — ABNORMAL HIGH (ref 70–99)

## 2022-04-08 LAB — MAGNESIUM: Magnesium: 1.6 mg/dL — ABNORMAL LOW (ref 1.7–2.4)

## 2022-04-08 MED ORDER — INSULIN ASPART 100 UNIT/ML IJ SOLN
5.0000 [IU] | Freq: Three times a day (TID) | INTRAMUSCULAR | Status: DC
  Administered 2022-04-09 – 2022-04-11 (×8): 5 [IU] via SUBCUTANEOUS

## 2022-04-08 MED ORDER — POTASSIUM CHLORIDE CRYS ER 20 MEQ PO TBCR
40.0000 meq | EXTENDED_RELEASE_TABLET | Freq: Once | ORAL | Status: AC
  Administered 2022-04-08: 40 meq via ORAL
  Filled 2022-04-08: qty 2

## 2022-04-08 MED ORDER — MAGNESIUM SULFATE IN D5W 1-5 GM/100ML-% IV SOLN
1.0000 g | Freq: Once | INTRAVENOUS | Status: AC
  Administered 2022-04-08: 1 g via INTRAVENOUS
  Filled 2022-04-08: qty 100

## 2022-04-08 MED ORDER — INSULIN DETEMIR 100 UNIT/ML ~~LOC~~ SOLN
15.0000 [IU] | Freq: Two times a day (BID) | SUBCUTANEOUS | Status: DC
  Administered 2022-04-08 – 2022-04-11 (×6): 15 [IU] via SUBCUTANEOUS
  Filled 2022-04-08 (×8): qty 0.15
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
## 2022-04-08 NOTE — Progress Notes (Signed)
Inpatient Diabetes Program Recommendations ? ?AACE/ADA: New Consensus Statement on Inpatient Glycemic Control  ? ?Target Ranges:  Prepandial:   less than 140 mg/dL ?     Peak postprandial:   less than 180 mg/dL (1-2 hours) ?     Critically ill patients:  140 - 180 mg/dL  ? ? Latest Reference Range & Units 04/08/22 04:54  ?Glucose 70 - 99 mg/dL 188 (H)  ? ? Latest Reference Range & Units 04/07/22 07:24 04/07/22 10:56 04/07/22 16:27 04/07/22 20:52  ?Glucose-Capillary 70 - 99 mg/dL 170 (H) 261 (H) 237 (H) 234 (H)  ? ?Review of Glycemic Control ? ?Diabetes history: DM2 ?Outpatient Diabetes medications: Levemir 50 units BID, Humalog 10-16 units TID, Metformin 1000 mg BID, Ozempic 0.5 mg Qweek (Sunday) ?Current orders for Inpatient glycemic control: Levemir 10 units BID, Novolog 0-20 units TID with meals, Novolog 0-5 units QHS, Novolog 4 units TID with meals ? ?Inpatient Diabetes Program Recommendations:   ? ?Insulin: Please consider changing Levemir to 15 units QAM, Levemir 10 units QHS, and increase meal coverage to Novolog 7 units TID with meals. ? ?Thanks, ?Barnie Alderman, RN, MSN, CDE ?Diabetes Coordinator ?Inpatient Diabetes Program ?[phone] (Team Pager from 8am to 5pm) ? ? ? ?

## 2022-04-08 NOTE — Progress Notes (Signed)
?   04/08/22 1426  ?Vitals  ?Temp (!) 100.4 ?F (38 ?C)  ?Temp Source Oral  ?BP (!) 141/88  ?MAP (mmHg) 104  ?BP Location Right Arm  ?BP Method Automatic  ?Patient Position (if appropriate) Lying  ?Pulse Rate (!) 104  ?Pulse Rate Source Monitor  ?Resp 18  ?MEWS COLOR  ?MEWS Score Color Green  ?Oxygen Therapy  ?SpO2 100 %  ?O2 Device Room Air  ?MEWS Score  ?MEWS Temp 0  ?MEWS Systolic 0  ?MEWS Pulse 1  ?MEWS RR 0  ?MEWS LOC 0  ?MEWS Score 1  ? ?Tylenol administered for fever.  ?

## 2022-04-09 DIAGNOSIS — E876 Hypokalemia: Secondary | ICD-10-CM | POA: Diagnosis not present

## 2022-04-09 DIAGNOSIS — R7881 Bacteremia: Secondary | ICD-10-CM

## 2022-04-09 DIAGNOSIS — A419 Sepsis, unspecified organism: Secondary | ICD-10-CM | POA: Diagnosis not present

## 2022-04-09 DIAGNOSIS — R410 Disorientation, unspecified: Secondary | ICD-10-CM

## 2022-04-09 DIAGNOSIS — E872 Acidosis, unspecified: Secondary | ICD-10-CM

## 2022-04-09 DIAGNOSIS — N10 Acute tubulo-interstitial nephritis: Secondary | ICD-10-CM | POA: Diagnosis not present

## 2022-04-09 LAB — LACTIC ACID, PLASMA
Lactic Acid, Venous: 2.7 mmol/L (ref 0.5–1.9)
Lactic Acid, Venous: 1.1 mmol/L (ref 0.5–1.9)

## 2022-04-09 LAB — BLOOD CULTURE ID PANEL (REFLEXED) - BCID2

## 2022-04-09 LAB — CBC WITH DIFFERENTIAL/PLATELET
Abs Immature Granulocytes: 0.02 10*3/uL (ref 0.00–0.07)
Basophils Absolute: 0 10*3/uL (ref 0.0–0.1)
Basophils Relative: 1 %
Eosinophils Absolute: 0.1 10*3/uL (ref 0.0–0.5)
Eosinophils Relative: 3 %
HCT: 36.2 % (ref 36.0–46.0)
Hemoglobin: 11.7 g/dL — ABNORMAL LOW (ref 12.0–15.0)
Immature Granulocytes: 1 %
Lymphocytes Relative: 39 %
Lymphs Abs: 1.3 10*3/uL (ref 0.7–4.0)
MCH: 27.1 pg (ref 26.0–34.0)
MCHC: 32.3 g/dL (ref 30.0–36.0)
MCV: 84 fL (ref 80.0–100.0)
Monocytes Absolute: 0.1 10*3/uL (ref 0.1–1.0)
Monocytes Relative: 4 %
Neutro Abs: 1.7 10*3/uL (ref 1.7–7.7)
Neutrophils Relative %: 52 %
Platelets: 150 10*3/uL (ref 150–400)
RBC: 4.31 MIL/uL (ref 3.87–5.11)
RDW: 15.5 % (ref 11.5–15.5)
WBC: 3.2 10*3/uL — ABNORMAL LOW (ref 4.0–10.5)
nRBC: 0 % (ref 0.0–0.2)

## 2022-04-09 LAB — BASIC METABOLIC PANEL
Anion gap: 7 (ref 5–15)
BUN: 9 mg/dL (ref 6–20)
CO2: 20 mmol/L — ABNORMAL LOW (ref 22–32)
Calcium: 8.3 mg/dL — ABNORMAL LOW (ref 8.9–10.3)
Chloride: 110 mmol/L (ref 98–111)
Creatinine, Ser: 0.77 mg/dL (ref 0.44–1.00)
GFR, Estimated: >60 mL/min (ref >60)
Glucose, Bld: 183 mg/dL — ABNORMAL HIGH (ref 70–99)
Potassium: 3.4 mmol/L — ABNORMAL LOW (ref 3.5–5.1)
Sodium: 137 mmol/L (ref 135–145)

## 2022-04-09 LAB — GLUCOSE, CAPILLARY
Glucose-Capillary: 184 mg/dL — ABNORMAL HIGH (ref 70–99)
Glucose-Capillary: 223 mg/dL — ABNORMAL HIGH (ref 70–99)
Glucose-Capillary: 157 mg/dL — ABNORMAL HIGH (ref 70–99)
Glucose-Capillary: 145 mg/dL — ABNORMAL HIGH (ref 70–99)

## 2022-04-09 LAB — URINE CULTURE: Culture: 10000 — AB

## 2022-04-09 LAB — PROCALCITONIN: Procalcitonin: 4.01 ng/mL

## 2022-04-09 LAB — MAGNESIUM: Magnesium: 1.5 mg/dL — ABNORMAL LOW (ref 1.7–2.4)

## 2022-04-09 MED ORDER — KETOROLAC TROMETHAMINE 15 MG/ML IJ SOLN: 15.0000 mg | Freq: Once | INTRAMUSCULAR | Status: AC

## 2022-04-09 MED ORDER — MAGNESIUM SULFATE 2 GM/50ML IV SOLN: 2.0000 g | Freq: Once | INTRAVENOUS | Status: DC

## 2022-04-09 MED ORDER — SODIUM CHLORIDE 0.9 % IV SOLN
INTRAVENOUS | Status: AC
Start: 2022-04-09 — End: 2022-04-09

## 2022-04-09 MED ORDER — KETOROLAC TROMETHAMINE 30 MG/ML IJ SOLN
INTRAMUSCULAR | Status: AC
  Administered 2022-04-09: 30 mg
  Filled 2022-04-09: qty 1

## 2022-04-09 MED ORDER — MAGNESIUM SULFATE 2 GM/50ML IV SOLN
2.0000 g | Freq: Once | INTRAVENOUS | Status: AC
  Administered 2022-04-09: 2 g via INTRAVENOUS
  Filled 2022-04-09: qty 50

## 2022-04-09 MED ORDER — POTASSIUM CHLORIDE CRYS ER 20 MEQ PO TBCR
40.0000 meq | EXTENDED_RELEASE_TABLET | ORAL | Status: AC
  Administered 2022-04-09 (×2): 40 meq via ORAL
  Filled 2022-04-09 (×2): qty 2

## 2022-04-09 MED ORDER — SODIUM CHLORIDE 0.9 % IV SOLN
1.0000 g | Freq: Three times a day (TID) | INTRAVENOUS | Status: DC
  Administered 2022-04-09 – 2022-04-11 (×6): 1 g via INTRAVENOUS
  Filled 2022-04-09 (×6): qty 20

## 2022-04-09 MED ORDER — SACCHAROMYCES BOULARDII 250 MG PO CAPS
250.0000 mg | ORAL_CAPSULE | Freq: Two times a day (BID) | ORAL | Status: DC
  Administered 2022-04-09 – 2022-04-11 (×4): 250 mg via ORAL
  Filled 2022-04-09 (×4): qty 1

## 2022-04-09 NOTE — Progress Notes (Signed)
Pharmacy Antibiotic Note ? ?Yolanda [name] is a 51 y.o. female admitted on 04/05/2022 with UTI.  Pharmacy has been consulted for Merrem dosing. ? Urosepsis + fevers. Patient spiked fevers, tachycardi and having chills. Temp 103.1. Antibiotic changed. BCX no growth to date. Urine Cx waiting sensitivities. WBC WNL. ? ?Plan: ?Merrem 1gm IV q8h ?F/U cxs and clinical progress ?Monitor V/S , labs ? ?Height: 5' 7"  (170.2 cm) ?Weight: 99.8 kg (220 lb) ?IBW/kg (Calculated) : 61.6 ? ?Temp (24hrs), Avg:100.1 ?F (37.8 ?C), Min:98 ?F (36.7 ?C), Max:103.1 ?F (39.5 ?C) ? ?Recent Labs  ?Lab 04/05/22 ?2215 04/06/22 ?1610 04/06/22 ?1052 04/06/22 ?1220 04/07/22 ?9604 04/07/22 ?5409 04/07/22 ?8119 04/08/22 ?1478 04/09/22 ?2956 04/09/22 ?0302 04/09/22 ?0501  ?WBC 14.2* 11.8*  --   --  6.8  --   --  5.5  --  3.2*  --   ?CREATININE 0.87 0.77  --   --  0.63  --   --  0.63  --   --  0.77  ?LATICACIDVEN 1.4  --    < > 2.8*  --  0.7 1.1  --  2.7*  --  1.1  ? < > = values in this interval not displayed.  ?  ?Estimated Creatinine Clearance: 103.3 mL/min (by C-G formula based on SCr of 0.77 mg/dL).   ? ?Allergies  ?Allergen Reactions  ? Abilify [Aripiprazole] Other (See Comments)  ?  Altered mental status   ? Sulfa Antibiotics Hives, Rash and Other (See Comments)  ?  unknown  ? ? ?Antimicrobials this admission: ?Ceftriaxone 5/5 >> 5/9 ?Merrem 5/9 >>  ? ?Microbiology results: ?5/9 BCX: pending ?5/5 BCX:  ngtd ?5/5 Ucx: 10K CFU/ml K. PNeumo ?   10K CFU/ml E. Coli ?5/6 MRSA PCR: negative ? ?Thank you for allowing pharmacy to be a part of this patient?s care. ? ?Isac Sarna, BS Pharm D, BCPS ?Clinical Pharmacist ?04/09/2022 7:32 AM ? ?
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
## 2022-04-09 NOTE — Progress Notes (Signed)
?   04/09/22 1750  ?Assess: MEWS Score  ?BP (!) 144/94  ?Pulse Rate (!) 114  ?Resp 20  ?Level of Consciousness Alert  ?SpO2 100 %  ?O2 Device Room Air  ?Assess: MEWS Score  ?MEWS Temp 0  ?MEWS Systolic 0  ?MEWS Pulse 2  ?MEWS RR 0  ?MEWS LOC 0  ?MEWS Score 2  ?MEWS Score Color Yellow  ?Assess: if the MEWS score is Yellow or Red  ?Were vital signs taken at a resting state? Yes  ?Focused Assessment No change from prior assessment  ?Early Detection of Sepsis Score *See Row Information* High  ?MEWS guidelines implemented *See Row Information* Yes  ?Treat  ?MEWS Interventions Other (Comment) ?(MD/charge made aware)  ?Pain Scale 0-10  ?Pain Score 0  ?Take Vital Signs  ?Increase Vital Sign Frequency  Yellow: Q 2hr X 2 then Q 4hr X 2, if remains yellow, continue Q 4hrs  ?Escalate  ?MEWS: Escalate Yellow: discuss with charge nurse/RN and consider discussing with provider and RRT  ?Notify: Charge Nurse/RN  ?Name of Charge Nurse/RN Notified Emiliano Dyer, RN  ?Date Charge Nurse/RN Notified 04/09/22  ?Time Charge Nurse/RN Notified 1800  ?Notify: Provider  ?Provider Name/Title Dr. Dyann Kief  ?Date Provider Notified 04/09/22  ?Time Provider Notified 1800  ?Method of Notification  ?(secured chat)  ?Notification Reason Change in status  ?Provider response No new orders  ?Date of Provider Response 04/09/22  ?Time of Provider Response 1805  ?Document  ?Patient Outcome Other (Comment) ?(Pt stable)  ?Progress note created (see row info) Yes  ? ? ?

## 2022-04-09 NOTE — Progress Notes (Signed)
Pt has been oob in room several times today, with minimal assistance. No complaints of pain or discomfort. Has been very pleasant this shift. Pt is currently yellow MEWS due to 114 pulse rate, MD and charge nurse made aware, no new orders at this time.  ?

## 2022-04-09 NOTE — Progress Notes (Signed)
Pt. Called to nurse station wanting a warm blanket because she was cold. Upon assessment, Pt. Was shivering and hyperventilating. VS were 103.1, 176/122, 140, 46, and 94% on RA. Red MEWS 8. Called rapid response. Gave Tylenol, Toradol 39m, and 2540mBolus of NS. Around 0030 Resident was feeling better and [facility] Pt. C/o being too hot at this time. Changed bed linens and gown, Initiated new IV. Resident now asleep with call bell in reach and bed in the lowest position. ? ? ? ? ?

## 2022-04-09 NOTE — Progress Notes (Addendum)
Rapid response ? ?Rapid response was called due to [place] 8.  At bedside patient was febrile, tachycardic, hyperventilating and was having chills.  Temperature at bedside was 103.51F, Tylenol was given, IV Toradol 15 mg x 1 was given and IV bolus of 50 mg x 1 was given.  Ice pack was also provided. ?CBC, BMP, mag, procalcitonin and lactic acid,  blood culture x2 and urine culture were obtained ? ?Physical exam ?Temperature 103.1, BP 176/122, pulse 140, respiratory rate 46/min ? ?General: Ill appearing but in no acute distress.  Awake and alert and oriented x3.   ?HEENT: NCAT.  PERRLA. EOMI. Sclerae anicteric.  Moist mucosal membranes. ?Neck: Neck supple without lymphadenopathy. No carotid bruits. No masses palpated.  ?Cardiovascular: Tachycardic.  Regular rate with normal S1-S2 sounds. No murmurs, rubs or gallops auscultated. No JVD.  ?Respiratory: Tachypneic.  Clear breath sounds.  No accessory muscle use. ?Abdomen: Soft, nontender, nondistended. Active bowel sounds. No masses or hepatosplenomegaly  ?Skin: No rashes, lesions, or ulcerations.  Dry, warm to touch. ?Musculoskeletal:  2+ dorsalis pedis and radial pulses. Good ROM.  No contractures  ?Psychiatric: Intact judgment and insight.  Mood appropriate to current condition. ?Neurologic: No focal neurological deficits. Strength is 5/5 x 4.  CN II - XII grossly intact. ? ?Assessment and plan ?Severe sepsis secondary to pyelonephritis ?Patient continues to meet sepsis criteria due to being tachycardic, tachypneic, leukopenic and lactic acidosis was 2.7 ?Continue treatment per primary team ? ?Leukopenia possibly due to sepsis as described above  ?WBC 3.2, continue to monitor ? ?Hypokalemia ?K+ 3.4; this will be replenished ? ?Lactic acidosis-resolved ?Lactic acid 2.7 > 1.1 ? ?Hypomagnesemia ?Mg 1.5, this will be replenished ? ?Critical time: 38 minutes ? ? Critical care personally provided  managing the patient due to high probability of clinically significant and  life threatening deterioration. This critical care time included obtaining a history; examining the patient, pulse oximetry; ordering and review of studies; arranging urgent treatment with development of a management plan; evaluation of patient's response of treatment; frequent reassessment; and discussions with other providers.  This critical care time was performed to assess and manage the high probability of imminent and life threatening deterioration that could result in multi-organ failure. ? ?

## 2022-04-09 NOTE — Progress Notes (Signed)
?   04/09/22 0000  ?Assess: MEWS Score  ?Temp (!) 103.1 ?F (39.5 ?C)  ?BP (!) 176/122  ?Pulse Rate (!) 140  ?Resp (!) 46  ?SpO2 94 %  ?O2 Device Room Air  ?Assess: MEWS Score  ?MEWS Temp 2  ?MEWS Systolic 0  ?MEWS Pulse 3  ?MEWS RR 3  ?MEWS LOC 0  ?MEWS Score 8  ?MEWS Score Color Red  ?Assess: if the MEWS score is Yellow or Red  ?Were vital signs taken at a resting state? No  ?Focused Assessment Change from prior assessment (see assessment flowsheet)  ?Early Detection of Sepsis Score *See Row Information* Low  ?Take Vital Signs  ?Increase Vital Sign Frequency  Red: Q 1hr X 4 then Q 4hr X 4, if remains red, continue Q 4hrs  ?Escalate  ?MEWS: Escalate Red: discuss with charge nurse/RN and provider, consider discussing with RRT  ?Notify: Charge Nurse/RN  ?Name of Charge Nurse/RN Notified Deeann Dowse RN  ?Date Charge Nurse/RN Notified 04/09/22  ?Time Charge Nurse/RN Notified 0000  ?Notify: Provider  ?Provider Name/Title Dr. Josephine Cables  ?Date Provider Notified 04/09/22  ?Time Provider Notified 0000  ?Method of Notification Call  ?Notification Reason Change in status  ?Provider response At bedside;See new orders  ? ? ?

## 2022-04-09 NOTE — Plan of Care (Signed)

## 2022-04-09 NOTE — Progress Notes (Signed)
?PROGRESS NOTE ? ? ? ?BRIGHTYN MOZER  LPF:[id number] DOB: June 30, 1971 DOA: 04/05/2022 ?PCP: Pablo Lawrence, NP  ? ?  ?Brief Narrative:  ?Yolanda [name] is a 51 yo female with past medical history significant for type II DM, systolic CHF, bipolar disorder and seizure who presented to the hospital due to altered mental status.  Per report, when patient went to go pick up her husband from work 2 days ago, she got confused and was driving around, ended up about 20 miles away.  Also complained of onset of worsening urinary incontinence and burning with urination.  In the emergency department, she was found to have fever 103.2, tachycardic and tachypneic with leukocytosis.  UA concerning for UTI.  Patient was started on IV fluid, Rocephin. ? ?New events last 24 hours / Subjective: ?Slowly improving but is still spiking fever and experiencing dysuria. ?Given high temperature past 48 hours of IV antibiotics initiated will broaden tx to meropenem and and follow cultures speciation and sensitivity. ? ?Assessment & Plan: ?  ?Principal Problem: ?  Severe sepsis ([place]) ?Active Problems: ?  Essential hypertension, benign ?  Bipolar disorder ([place]) ?  GERD (gastroesophageal reflux disease) ?  Mixed hyperlipidemia ?  Urinary tract infection, site not specified ?  Chronic diastolic CHF (congestive heart failure) ([place]) ?  Hyperglycemia ?  Sepsis secondary to UTI [facility]) ?  Altered mental status ?  Pseudohyponatremia ?  Hypokalemia ?  Hypoalbuminemia due to protein-calorie malnutrition ([place]) ?  Acute pyelonephritis ? ? ?Severe sepsis secondary to pyelonephritis ?-Sepsis present on admission, with encephalopathy ?-CT abdomen pelvis revealed left perinephric edema, suspicious for pyelonephritis ?-positive blood cultures for gram neg rods. ?-Medically improving.  Still with low-grade fever. ?-antibiotics broaden to meropenen; culture demonstarting E. Coli and K. Pneumoniae; will follow sensitivity. ?-Advised to maintain adequate  hydration. ?-Patient overall feeling better, but still spiking fever and also having dysuria. ? ?Acute metabolic encephalopathy ?-Secondary to above; mentation back to baseline. ?-CT head negative for acute intracranial abnormality ?-Improved/resolved. ?-Continue constant reorientation and supportive care. ? ?Diarrhea ?-C. difficile and GI PCR panel negative. ?-Appears to be functional/may be associated with antibiotics. ?-Continue Florastor. ?-reports improvement and only mild loose sacttered events currently ? ?Chronic diastolic CHF ?-Compensated currently. ?-Entresto, and Aldactone remains on hold. ?-Continue Toprol. ?-judicious IVF's in the setting of sepsis will be provided ?-follow daily weight and I's and O's ? ?Diabetes mellitus ?-Continue Levemir, NovoLog, sliding scale insulin ?-CBGs still running high; will further adjust hypoglycemic management. ?-Modified carbohydrate diet discussed with patient. ? ?Hypomagnesemia ?-Continue to replete as needed ?-Magnesium 1.5 ? ?Hypokalemia ?-Continue to follow electrolytes and further replete as needed ?-Potassium is 3.4 currently ? ?CAD ?-Continue aspirin and Lipitor ?-No chest pain. ?-Metoprolol has been resumed. ? ?Mood disorder ?-Continue the use of Klonopin,  Paxil, Geodon  ?-Mood is stable overall. ?-Shortness of breath is feeling slightly confused at the time of high-grade temperature, hypertension and tachycardia presented. ? ?DVT prophylaxis:  ?enoxaparin (LOVENOX) injection 40 mg Start: 04/06/22 1000 ?SCDs Start: 04/06/22 0245 ? ?Code Status: Full ?Family Communication: None at bedside ?Disposition Plan:  ?Status is: Inpatient ? ?Remains inpatient appropriate because: IV antibiotics, cultures pending still spiking fever. ? ?Antimicrobials:  ?Anti-infectives (From admission, onward)  ? ? Start     Dose/Rate Route Frequency Ordered Stop  ? 04/09/22 0830  meropenem (MERREM) 1 g in sodium chloride 0.9 % 100 mL IVPB       ? 1 g ?200 mL/hr over 30 Minutes  Intravenous Every  8 hours 04/09/22 0739    ? 04/05/22 2215  cefTRIAXone (ROCEPHIN) 2 g in sodium chloride 0.9 % 100 mL IVPB  Status:  Discontinued       ? 2 g ?200 mL/hr over 30 Minutes Intravenous Every 24 hours 04/05/22 2210 04/09/22 0725  ? ?  ? ? ? ?Objective: ?Vitals:  ? 04/09/22 0602 04/09/22 0848 04/09/22 1316 04/09/22 1750  ?BP: 106/83 111/76 (!) 127/93 (!) 144/94  ?Pulse: 91 93 (!) 103 (!) 114  ?Resp: 20  20 20   ?Temp: 98 ?F (36.7 ?C) 97.6 ?F (36.4 ?C) 98 ?F (36.7 ?C)   ?TempSrc: Oral Oral Oral   ?SpO2: 98% 100% 100% 100%  ?Weight:      ?Height:      ? ? ?Intake/Output Summary (Last 24 hours) at 04/09/2022 1902 ?Last data filed at 04/09/2022 1700 ?Gross per 24 hour  ?Intake 1160 ml  ?Output 750 ml  ?Net 410 ml  ? ?Filed Weights  ? 04/05/22 2151  ?Weight: 99.8 kg  ? ? ?Examination:  ?General exam: Alert, awake, oriented x 3; overnight experiencing high grade fever, MEWS of 8, tachycardia and hyperventilatory event. Patient expressed dysuria and flank pain. ?Respiratory system: Clear to auscultation. Respiratory effort normal.  No using accessory muscles; good saturation on room air at time of evaluation.   ?Cardiovascular system: Sinus tachycardia, no rubs, no gallops, no JVD. ?Gastrointestinal system: Abdomen is nondistended, soft and nontender. No organomegaly or masses felt. Normal bowel sounds heard. ?Central nervous system: Alert and oriented. No focal neurological deficits. ?Extremities: No cyanosis or clubbing; no edema. ?Skin: No petechiae. ?Psychiatry: Judgement and insight appear normal. Mood & affect appropriate.  ? ?Data Reviewed: I have personally reviewed following labs and imaging studies ? ?CBC: ?Recent Labs  ?Lab 04/05/22 ?2215 04/06/22 ?1700 04/07/22 ?1749 04/08/22 ?4496 04/09/22 ?0302  ?WBC 14.2* 11.8* 6.8 5.5 3.2*  ?NEUTROABS  --   --   --   --  1.7  ?HGB 12.4 10.2* 9.8* 9.8* 11.7*  ?HCT 37.0 31.0* 29.4* 30.2* 36.2  ?MCV 80.6 82.0 82.1 82.3 84.0  ?PLT 186 145* 127* 123* 150  ? ?Basic  Metabolic Panel: ?Recent Labs  ?Lab 04/05/22 ?2215 04/06/22 ?7591 04/07/22 ?6384 04/08/22 ?6659 04/09/22 ?0501  ?NA 129* 133* 136 137 137  ?K 3.2* 3.0* 3.6 3.4* 3.4*  ?CL 103 111 112* 114* 110  ?CO2 17* 18* 19* 19* 20*  ?GLUCOSE 384* 336* 209* 188* 183*  ?BUN 10 9 8 7 9   ?CREATININE 0.87 0.77 0.63 0.63 0.77  ?CALCIUM 8.6* 7.6* 8.0* 8.1* 8.3*  ?MG  --  1.2* 1.5* 1.6* 1.5*  ?PHOS  --  2.0*  --   --   --   ? ?GFR: ?Estimated Creatinine Clearance: 103.3 mL/min (by C-G formula based on SCr of 0.77 mg/dL). ? ?Liver Function Tests: ?Recent Labs  ?Lab 04/05/22 ?2215 04/06/22 ?0432  ?AST 17 14*  ?ALT 20 15  ?ALKPHOS 103 81  ?BILITOT 1.2 0.8  ?PROT 7.2 5.6*  ?ALBUMIN 3.3* 2.6*  ? ?Coagulation Profile: ?Recent Labs  ?Lab 04/05/22 ?2215  ?INR 1.2  ? ?CBG: ?Recent Labs  ?Lab 04/08/22 ?1701 04/08/22 ?2103 04/09/22 ?0730 04/09/22 ?1129 04/09/22 ?1627  ?[place] ?Sepsis Labs: ?Recent Labs  ?Lab 04/07/22 ?0705 04/07/22 ?0933 04/09/22 ?0026 04/09/22 ?0501  ?PROCALCITON  --   --  4.01  --   ?LATICACIDVEN 0.7 1.1 2.7* 1.1  ? ? ?Recent Results (from the past 240 hour(s))  ?Resp  Panel by RT-PCR (Flu A&B, Covid) Nasopharyngeal Swab     Status: None  ? Collection Time: 04/05/22 10:10 PM  ? Specimen: Nasopharyngeal Swab; Nasopharyngeal(NP) swabs in vial transport medium  ?Result Value Ref Range Status  ? SARS Coronavirus 2 by RT PCR NEGATIVE NEGATIVE Final  ?  Comment: (NOTE) ?SARS-CoV-2 target nucleic acids are NOT DETECTED. ? ?The SARS-CoV-2 RNA is generally detectable in upper respiratory ?specimens during the acute phase of infection. The lowest ?concentration of SARS-CoV-2 viral copies this assay can detect is ?138 copies/mL. A negative result does not preclude SARS-Cov-2 ?infection and should not be used as the sole basis for treatment or ?other patient management decisions. A negative result may occur with  ?improper specimen collection/handling, submission of specimen other ?than nasopharyngeal swab,  presence of viral mutation(s) within the ?areas targeted by this assay, and inadequate number of viral ?copies(<138 copies/mL). A negative result must be combined with ?clinical observations, patient history, and epidemiological ?info

## 2022-04-10 DIAGNOSIS — I5032 Chronic diastolic (congestive) heart failure: Secondary | ICD-10-CM | POA: Diagnosis not present

## 2022-04-10 DIAGNOSIS — A419 Sepsis, unspecified organism: Secondary | ICD-10-CM | POA: Diagnosis not present

## 2022-04-10 DIAGNOSIS — N10 Acute tubulo-interstitial nephritis: Secondary | ICD-10-CM | POA: Diagnosis not present

## 2022-04-10 DIAGNOSIS — R410 Disorientation, unspecified: Secondary | ICD-10-CM | POA: Diagnosis not present

## 2022-04-10 LAB — GLUCOSE, CAPILLARY
Glucose-Capillary: 155 mg/dL — ABNORMAL HIGH (ref 70–99)
Glucose-Capillary: 211 mg/dL — ABNORMAL HIGH (ref 70–99)
Glucose-Capillary: 154 mg/dL — ABNORMAL HIGH (ref 70–99)
Glucose-Capillary: 163 mg/dL — ABNORMAL HIGH (ref 70–99)

## 2022-04-10 LAB — URINE CULTURE
Culture: NO GROWTH
Special Requests: NORMAL

## 2022-04-10 LAB — CULTURE, BLOOD (ROUTINE X 2)
Culture: NO GROWTH
Culture: NO GROWTH
Special Requests: ADEQUATE
Special Requests: ADEQUATE

## 2022-04-10 LAB — CBC
HCT: 32.7 % — ABNORMAL LOW (ref 36.0–46.0)
Hemoglobin: 10.3 g/dL — ABNORMAL LOW (ref 12.0–15.0)
MCH: 26.3 pg (ref 26.0–34.0)
MCHC: 31.5 g/dL (ref 30.0–36.0)
MCV: 83.4 fL (ref 80.0–100.0)
Platelets: 164 10*3/uL (ref 150–400)
RBC: 3.92 MIL/uL (ref 3.87–5.11)
RDW: 15.6 % — ABNORMAL HIGH (ref 11.5–15.5)
WBC: 6.4 10*3/uL (ref 4.0–10.5)
nRBC: 0 % (ref 0.0–0.2)

## 2022-04-10 LAB — BASIC METABOLIC PANEL
Anion gap: 5 (ref 5–15)
BUN: 8 mg/dL (ref 6–20)
CO2: 21 mmol/L — ABNORMAL LOW (ref 22–32)
Calcium: 8.2 mg/dL — ABNORMAL LOW (ref 8.9–10.3)
Chloride: 111 mmol/L (ref 98–111)
Creatinine, Ser: 0.67 mg/dL (ref 0.44–1.00)
GFR, Estimated: >60 mL/min (ref >60)
Glucose, Bld: 154 mg/dL — ABNORMAL HIGH (ref 70–99)
Potassium: 4.2 mmol/L (ref 3.5–5.1)
Sodium: 137 mmol/L (ref 135–145)

## 2022-04-10 LAB — MAGNESIUM: Magnesium: 1.6 mg/dL — ABNORMAL LOW (ref 1.7–2.4)

## 2022-04-10 LAB — PROCALCITONIN: Procalcitonin: 11.15 ng/mL

## 2022-04-10 MED ORDER — MAGNESIUM SULFATE 2 GM/50ML IV SOLN
2.0000 g | Freq: Once | INTRAVENOUS | Status: AC
  Administered 2022-04-10: 2 g via INTRAVENOUS

## 2022-04-10 NOTE — Progress Notes (Signed)
?PROGRESS NOTE ? ? ? ?Yolanda [name]  YHC:[id number] DOB: [birth year] DOA: 04/05/2022 ?PCP: Pablo Lawrence, NP  ? ?  ?Brief Narrative:  ?Yolanda [name] is a 51 yo female with past medical history significant for type II DM, systolic CHF, bipolar disorder and seizure who presented to the hospital due to altered mental status.  Per report, when patient went to go pick up her husband from work 2 days ago, she got confused and was driving around, ended up about 20 miles away.  Also complained of onset of worsening urinary incontinence and burning with urination.  In the emergency department, she was found to have fever 103.2, tachycardic and tachypneic with leukocytosis.  UA concerning for UTI.  Patient was started on IV fluid, Rocephin. ? ?New events last 24 hours / Subjective: ?Mentation back to baseline; currently afebrile, no nausea, no vomiting.  Will continue meropenem for another 24 hours and follow final sensitivity prior to transition to oral route. ? ?Assessment & Plan: ?  ?Principal Problem: ?  Severe sepsis ([place]) ?Active Problems: ?  Essential hypertension, benign ?  Bipolar disorder ([place]) ?  GERD (gastroesophageal reflux disease) ?  Mixed hyperlipidemia ?  Urinary tract infection, site not specified ?  Chronic diastolic CHF (congestive heart failure) ([place]) ?  Hyperglycemia ?  Sepsis secondary to UTI [facility]) ?  Altered mental status ?  Pseudohyponatremia ?  Hypokalemia ?  Hypoalbuminemia due to protein-calorie malnutrition ([place]) ?  Acute pyelonephritis ? ? ?Severe sepsis secondary to pyelonephritis and bacteremia due to E. coli/Klebsiella pneumoniae ?-Sepsis present on admission, with encephalopathy ?-CT abdomen pelvis revealed left perinephric edema, suspicious for pyelonephritis ?-positive blood cultures for gram neg rods; most likely due to same microorganisms. ?-Medically improving.   ?-E. Coli and K. Pneumoniae; with findings suggesting partial ESBL. ?-Advised to maintain adequate hydration. ?-Patient  overall feeling and currently afebrile; no nausea or vomiting. ?-Case discussed with infectious disease service (Dr. Juleen China) recommended continue treatment with IV meropenem for another 24 hours and follow final sensitivity results. ? ?Acute metabolic encephalopathy ?-Secondary to above; improved/resolved. ?-CT head negative for acute intracranial abnormality ?-Continue constant reorientation and supportive care. ?-Mentation is back to baseline. ? ?Diarrhea ?-C. difficile and GI PCR panel negative. ?-Appears to be functional/may be associated with antibiotics. ?-Continue Florastor. ?-reports improvement and no further episodes of diarrhea. ? ?Chronic diastolic CHF ?-Stable and compensated currently. ?-Continue to hold Entresto, and Aldactone for now. ?-Continue Toprol. ?-Continue judicious IVF's in the setting of sepsis will be provided ?-follow daily weight and I's and O's ? ?Diabetes mellitus ?-Continue Levemir, NovoLog, sliding scale insulin ?-CBGs still running high; will further adjust hypoglycemic management. ?-Modified carbohydrate diet discussed with patient. ? ?Hypomagnesemia ?-Continue to replete as needed ?-Magnesium 1.6 ? ?Hypokalemia ?-Continue to follow electrolytes and further replete as needed ?-Potassium is 4.2 ? ?CAD ?-Continue aspirin and Lipitor ?-No chest pain. ?-Metoprolol has been resumed. ? ?Mood disorder ?-Continue the use of Klonopin,  Paxil, Geodon  ?-Mood is stable overall. ?-Shortness of breath is feeling slightly confused at the time of high-grade temperature, hypertension and tachycardia presented. ? ?DVT prophylaxis:  ?enoxaparin (LOVENOX) injection 40 mg Start: 04/06/22 1000 ?SCDs Start: 04/06/22 0245 ? ?Code Status: Full ?Family Communication: None at bedside ?Disposition Plan:  ?Status is: Inpatient ? ?Remains inpatient appropriate because: IV antibiotics, cultures pending still spiking fever. ? ?Antimicrobials:  ?Anti-infectives (From admission, onward)  ? ? Start     Dose/Rate  Route Frequency Ordered Stop  ? 04/09/22 0830  meropenem (  MERREM) 1 g in sodium chloride 0.9 % 100 mL IVPB       ? 1 g ?200 mL/hr over 30 Minutes Intravenous Every 8 hours 04/09/22 0739    ? 04/05/22 2215  cefTRIAXone (ROCEPHIN) 2 g in sodium chloride 0.9 % 100 mL IVPB  Status:  Discontinued       ? 2 g ?200 mL/hr over 30 Minutes Intravenous Every 24 hours 04/05/22 2210 04/09/22 0725  ? ?  ? ? ? ?Objective: ?Vitals:  ? 04/09/22 2318 04/10/22 0157 04/10/22 0548 04/10/22 1304  ?BP: 132/77 126/86 125/80 103/77  ?Pulse: (!) 119 (!) 121 (!) 104 98  ?Resp: 20 19 19 18   ?Temp: (!) 100.5 ?F (38.1 ?C) 100 ?F (37.8 ?C) 98.1 ?F (36.7 ?C) 98.2 ?F (36.8 ?C)  ?TempSrc: Oral Oral Oral   ?SpO2: 98% 95% 97% 97%  ?Weight:      ?Height:      ? ? ?Intake/Output Summary (Last 24 hours) at 04/10/2022 1605 ?Last data filed at 04/10/2022 1100 ?Gross per 24 hour  ?Intake 2165.23 ml  ?Output --  ?Net 2165.23 ml  ? ?Filed Weights  ? 04/05/22 2151  ?Weight: 99.8 kg  ? ? ?Examination:  ?General exam: Alert, awake, oriented x 3; no chest pain, no nausea, no vomiting, no shortness of breath.  No overnight events. ?Respiratory system: Clear to auscultation. Respiratory effort normal.  Good saturation on room air. ?Cardiovascular system:RRR. No murmurs, rubs, gallops.  No JVD. ?Gastrointestinal system: Abdomen is nondistended, soft and nontender. No organomegaly or masses felt. Normal bowel sounds heard. ?Central nervous system: Alert and oriented. No focal neurological deficits. ?Extremities: No cyanosis or clubbing, no edema. ?Skin: No petechiae. ?Psychiatry: Judgement and insight appear normal. Mood & affect appropriate.  ? ?Data Reviewed: I have personally reviewed following labs and imaging studies ? ?CBC: ?Recent Labs  ?Lab 04/06/22 ?0370 04/07/22 ?0618 04/08/22 ?4888 04/09/22 ?0302 04/10/22 ?9169  ?WBC 11.8* 6.8 5.5 3.2* 6.4  ?NEUTROABS  --   --   --  1.7  --   ?HGB 10.2* 9.8* 9.8* 11.7* 10.3*  ?HCT 31.0* 29.4* 30.2* 36.2 32.7*  ?MCV 82.0 82.1  82.3 84.0 83.4  ?PLT 145* 127* 123* 150 164  ? ?Basic Metabolic Panel: ?Recent Labs  ?Lab 04/06/22 ?4503 04/07/22 ?0618 04/08/22 ?8882 04/09/22 ?0501 04/10/22 ?8003  ?NA 133* 136 137 137 137  ?K 3.0* 3.6 3.4* 3.4* 4.2  ?CL 111 112* 114* 110 111  ?CO2 18* 19* 19* 20* 21*  ?GLUCOSE 336* 209* 188* 183* 154*  ?BUN 9 8 7 9 8   ?CREATININE 0.77 0.63 0.63 0.77 0.67  ?CALCIUM 7.6* 8.0* 8.1* 8.3* 8.2*  ?MG 1.2* 1.5* 1.6* 1.5* 1.6*  ?PHOS 2.0*  --   --   --   --   ? ?GFR: ?Estimated Creatinine Clearance: 103.3 mL/min (by C-G formula based on SCr of 0.67 mg/dL). ? ?Liver Function Tests: ?Recent Labs  ?Lab 04/05/22 ?2215 04/06/22 ?0432  ?AST 17 14*  ?ALT 20 15  ?ALKPHOS 103 81  ?BILITOT 1.2 0.8  ?PROT 7.2 5.6*  ?ALBUMIN 3.3* 2.6*  ? ?Coagulation Profile: ?Recent Labs  ?Lab 04/05/22 ?2215  ?INR 1.2  ? ?CBG: ?Recent Labs  ?Lab 04/09/22 ?1129 04/09/22 ?1627 04/09/22 ?2059 04/10/22 ?0737 04/10/22 ?1136  ?GLUCAP 223* 157* 145* 155* 211*  ? ?Sepsis Labs: ?Recent Labs  ?Lab 04/07/22 ?0705 04/07/22 ?4917 04/09/22 ?0026 04/09/22 ?0501 04/10/22 ?9150  ?PROCALCITON  --   --  4.01  --  11.15  ?LATICACIDVEN  0.7 1.1 2.7* 1.1  --   ? ? ?Recent Results (from the past 240 hour(s))  ?Resp Panel by RT-PCR (Flu A&B, Covid) Nasopharyngeal Swab     Status: None  ? Collection Time: 04/05/22 10:10 PM  ? Specimen: Nasopharyngeal Swab; Nasopharyngeal(NP) swabs in vial transport medium  ?Result Value Ref Range Status  ? SARS Coronavirus 2 by RT PCR NEGATIVE NEGATIVE Final  ?  Comment: (NOTE) ?SARS-CoV-2 target nucleic acids are NOT DETECTED. ? ?The SARS-CoV-2 RNA is generally detectable in upper respiratory ?specimens during the acute phase of infection. The lowest ?concentration of SARS-CoV-2 viral copies this assay can detect is ?138 copies/mL. A negative result does not preclude SARS-Cov-2 ?infection and should not be used as the sole basis for treatment or ?other patient management decisions. A negative result may occur with  ?improper specimen  collection/handling, submission of specimen other ?than nasopharyngeal swab, presence of viral mutation(s) within the ?areas targeted by this assay, and inadequate number of viral ?copies(<138 copies/mL). A neg

## 2022-04-10 NOTE — Plan of Care (Signed)

## 2022-04-11 ENCOUNTER — Inpatient Hospital Stay: Payer: Self-pay

## 2022-04-11 DIAGNOSIS — F319 Bipolar disorder, unspecified: Secondary | ICD-10-CM | POA: Diagnosis not present

## 2022-04-11 DIAGNOSIS — R410 Disorientation, unspecified: Secondary | ICD-10-CM | POA: Diagnosis not present

## 2022-04-11 DIAGNOSIS — E1159 Type 2 diabetes mellitus with other circulatory complications: Secondary | ICD-10-CM

## 2022-04-11 DIAGNOSIS — A419 Sepsis, unspecified organism: Secondary | ICD-10-CM | POA: Diagnosis not present

## 2022-04-11 DIAGNOSIS — N10 Acute tubulo-interstitial nephritis: Secondary | ICD-10-CM | POA: Diagnosis not present

## 2022-04-11 LAB — GLUCOSE, CAPILLARY
Glucose-Capillary: 155 mg/dL — ABNORMAL HIGH (ref 70–99)
Glucose-Capillary: 171 mg/dL — ABNORMAL HIGH (ref 70–99)
Glucose-Capillary: 123 mg/dL — ABNORMAL HIGH (ref 70–99)

## 2022-04-11 LAB — CULTURE, BLOOD (ROUTINE X 2): Special Requests: ADEQUATE

## 2022-04-11 LAB — PROCALCITONIN: Procalcitonin: 6.07 ng/mL

## 2022-04-11 MED ORDER — SODIUM CHLORIDE 0.9% FLUSH: 10.0000 mL | INTRAVENOUS | Status: DC | PRN

## 2022-04-11 MED ORDER — SODIUM CHLORIDE 0.9 % IV SOLN
1.0000 g | INTRAVENOUS | Status: DC
  Administered 2022-04-11: 1000 mg via INTRAVENOUS
  Filled 2022-04-11 (×3): qty 1

## 2022-04-11 MED ORDER — SODIUM CHLORIDE 0.9% FLUSH: 10.0000 mL | Freq: Two times a day (BID) | INTRAVENOUS | Status: DC

## 2022-04-11 MED ORDER — ERTAPENEM IV (FOR PTA / DISCHARGE USE ONLY): 1.0000 g | INTRAVENOUS | 0 refills | Status: AC

## 2022-04-11 MED ORDER — SACCHAROMYCES BOULARDII 250 MG PO CAPS
250.0000 mg | ORAL_CAPSULE | Freq: Two times a day (BID) | ORAL | 0 refills | Status: DC
Start: 2022-04-11 — End: 2023-10-13

## 2022-04-11 MED ORDER — INSULIN DETEMIR 100 UNIT/ML FLEXPEN: 50.0000 [IU] | PEN_INJECTOR | Freq: Two times a day (BID) | SUBCUTANEOUS | Status: DC

## 2022-04-11 NOTE — Progress Notes (Signed)
PHARMACY CONSULT NOTE FOR: ? ?OUTPATIENT  PARENTERAL ANTIBIOTIC THERAPY (OPAT) ? ?Indication: ESBL bacteremia ?Regimen: Ertapenem 1g IV every 24 hours ?End date: 04/15/22 ? ?IV antibiotic discharge orders are pended. ?To discharging provider:  please sign these orders via discharge navigator,  ?Select New Orders & click on the button choice - Manage This Unsigned Work.  ?  ? ?Thank you for allowing pharmacy to be a part of this patient?s care. ? ?Alycia Rossetti, PharmD, BCPS ?Infectious Diseases Clinical Pharmacist ?04/11/2022 10:48 AM  ? ?**Pharmacist phone directory can now be found on [email or web address] ([email or web address]).  Listed under [place].  ?

## 2022-04-11 NOTE — Progress Notes (Signed)
Nsg Discharge Note ? ?Admit Date:  04/05/2022 ?Discharge date: 04/11/2022 ?  ?[name] to be D/C'd Home per MD order.  AVS completed.  Copy for chart, and copy for patient signed, and dated. ?Patient/caregiver able to verbalize understanding. ? ?Discharge Medication: ?Allergies as of 04/11/2022   ? ?   Reactions  ? Abilify [aripiprazole] Other (See Comments)  ? Altered mental status   ? Sulfa Antibiotics Hives, Rash, Other (See Comments)  ? unknown  ? ?  ? ?  ?Medication List  ?  ? ?STOP taking these medications   ? ?Boric Acid Vaginal 600 MG Supp ?  ?guaiFENesin 600 MG 12 hr tablet ?Commonly known as: Mucinex ?  ?losartan 25 MG tablet ?Commonly known as: COZAAR ?  ?metroNIDAZOLE 0.75 % vaginal gel ?Commonly known as: METROGEL VAGINAL ?  ?metroNIDAZOLE 500 MG tablet ?Commonly known as: FLAGYL ?  ? ?  ? ?TAKE these medications   ? ?acetaminophen 500 MG tablet ?Commonly known as: TYLENOL ?Take 1,500 mg by mouth every 6 (six) hours as needed. ?  ?aspirin 81 MG EC tablet ?Take 1 tablet (81 mg total) by mouth daily. Swallow whole. ?  ?atorvastatin 40 MG tablet ?Commonly known as: LIPITOR ?Take 40 mg by mouth at bedtime. ?  ?clonazePAM 0.5 MG tablet ?Commonly known as: KLONOPIN ?Take 0.5 mg by mouth 2 (two) times daily as needed for anxiety. ?  ?Entresto 49-51 MG ?Generic drug: sacubitril-valsartan ?TAKE ONE TABLET BY MOUTH TWICE A DAY ?  ?ertapenem  IVPB ?Commonly known as: INVANZ ?Inject 1 g into the vein daily for 5 days. Indication:  ESBL bacteremia ?First Dose: Yes ?Last Day of Therapy:  04/17/22 ?Labs - Once weekly:  CBC/D and BMP, ?Labs - Every other week:  ESR and CRP ?Method of administration: Mini-Bag Plus / Gravity ?Method of administration may be changed at the discretion of home infusion pharmacist based upon assessment of the patient and/or caregiver's ability to self-administer the medication ordered. ?Start taking on: Apr 12, 2022 ?  ?HumaLOG 100 UNIT/ML injection ?Generic drug: insulin lispro ?INJECT  10-16 UNITS TOTAL (0.1 - 0.16 MLSSUBCUTANEOUSLY INTO THE SKIN 3 TIMES A DAY ?What changed: See the new instructions. ?  ?insulin detemir 100 UNIT/ML FlexPen ?Commonly known as: LEVEMIR ?Inject 50 Units into the skin 2 (two) times daily. Spoke to patient by phone and she states that she was taking Levemir 50 units twice a day ?  ?metFORMIN 1000 MG tablet ?Commonly known as: GLUCOPHAGE ?Take 1 tablet (1,000 mg total) by mouth 2 (two) times daily with a meal. ?  ?methocarbamol 500 MG tablet ?Commonly known as: ROBAXIN ?Take 500 mg by mouth 3 (three) times daily. ?  ?metoprolol succinate 50 MG 24 hr tablet ?Commonly known as: TOPROL-XL ?Take 1.5 tablets (75 mg total) by mouth daily. Take with or immediately following a meal. ?What changed: how much to take ?  ?Myrbetriq 50 MG Tb24 tablet ?Generic drug: mirabegron ER ?TAKE ONE TABLET (50MG TOTAL) BY MOUTH DAILY AT BEDTIME ?What changed: See the new instructions. ?  ?nystatin powder ?Commonly known as: MYCOSTATIN/NYSTOP ?Apply topically 3 (three) times daily. ?  ?omeprazole 40 MG capsule ?Commonly known as: PRILOSEC ?Take 40 mg by mouth daily. ?  ?OneTouch Verio test strip ?Generic drug: glucose blood ?Test glucose 5 times a day ?  ?Oxycodone HCl 10 MG Tabs ?Take 10 mg by mouth every 6 (six) hours as needed for pain. ?  ?Ozempic (0.25 or 0.5 MG/DOSE) 2 MG/1.5ML Sopn ?Generic drug:  Semaglutide(0.25 or 0.5MG/DOS) ?Inject 0.5 mg into the skin once a week. Sunday ?  ?PARoxetine 30 MG tablet ?Commonly known as: PAXIL ?Take 60 mg by mouth at bedtime. ?  ?pregabalin 300 MG capsule ?Commonly known as: LYRICA ?Take 300 mg by mouth every 12 (twelve) hours. ?  ?saccharomyces boulardii 250 MG capsule ?Commonly known as: FLORASTOR ?Take 1 capsule (250 mg total) by mouth 2 (two) times daily. ?  ?spironolactone 25 MG tablet ?Commonly known as: ALDACTONE ?Take 0.5 tablets (12.5 mg total) by mouth daily. ?  ?Trokendi XR 200 MG Cp24 ?Generic drug: Topiramate ER ?Take 200 mg by mouth  daily. ?  ?Vitamin D (Ergocalciferol) 1.25 MG (50000 UNIT) Caps capsule ?Commonly known as: DRISDOL ?Take 1 capsule (50,000 Units total) by mouth every 7 (seven) days. ?  ?ziprasidone 80 MG capsule ?Commonly known as: GEODON ?Take 80 mg by mouth every evening. ?  ? ?  ? ?  ?  ? ? ?  ?Discharge Care Instructions  ?(From admission, onward)  ?  ? ? ?  ? ?  Start     Ordered  ? 04/11/22 0000  Change dressing on IV access line weekly and PRN  (Home infusion instructions - [facility] )       ? 04/11/22 1249  ? ?  ?  ? ?  ? ? ?Discharge Assessment: ?Vitals:  ? 04/11/22 0533 04/11/22 1311  ?BP: 126/89 (!) 145/85  ?Pulse: 99 85  ?Resp: 16 18  ?Temp: 99.1 ?F (37.3 ?C) 98.2 ?F (36.8 ?C)  ?SpO2: 97% 100%  ? Skin clean, dry and intact without evidence of skin break down, no evidence of skin tears noted. ?IV catheter discontinued intact. Site without signs and symptoms of complications - no redness or edema noted at insertion site, patient denies c/o pain - only slight tenderness at site.  Dressing with slight pressure applied. ? ?D/c Instructions-Education: ?Discharge instructions given to patient/family with verbalized understanding. ?D/c education completed with patient/family including follow up instructions, medication list, d/c activities limitations if indicated, with other d/c instructions as indicated by MD - patient able to verbalize understanding, all questions fully answered. ?Patient instructed to return to ED, call 911, or call MD for any changes in condition.  ?Patient escorted via [place], and D/C home via private auto. ? ?Clovis Fredrickson, LPN ?7/62/2633 3:54 PM  ?

## 2022-04-11 NOTE — TOC Transition Note (Signed)
Transition of Care (TOC) - CM/SW Discharge Note ? ? ?Patient Details  ?Name: Yolanda [name] ?MRN: [id number] ?Date of Birth: [birth year] ? ?Transition of Care (TOC) CM/SW Contact:  ?Shade Flood, LCSW ?Phone Number: ?04/11/2022, 1:17 PM ? ? ?Clinical Narrative:    ? ?Pt stable for dc today per MD. Pt will be getting midline and will need IV anbx at dc. Pt aware and referral sent to Pam at [facility]. Per Pam, they can have medication delivered to pt's home this evening and their pre-arranged [facility] RN will see pt tomorrow to teach and get pt started on her home regimen.  ? ?No other TOC needs identified for dc.  ? ?Final next level of care: [place] ?Barriers to Discharge: Barriers Resolved ? ? ?Patient Goals and CMS Choice ?Patient states their goals for this hospitalization and ongoing recovery are:: go home ?CMS Medicare.gov Compare Post Acute Care list provided to:: Patient ?Choice offered to / list presented to : Patient ? ?Discharge Placement ?  ?           ?  ?  ?  ?  ? ?Discharge Plan and Services ?In-house Referral: Clinical Social Work ?  ?           ?  ?  ?  ?  ?  ?HH Arranged: IV Antibiotics ?[place] Agency: [facility] ?Date HH Agency Contacted: 04/10/22 ?  ?Representative spoke with at [place]: [place] ? ?Social Determinants of Health (SDOH) Interventions ?  ? ? ?Readmission Risk Interventions ? ?  04/09/2022  ? 11:47 AM  ?Readmission Risk Prevention Plan  ?Transportation Screening Complete  ?[place] or Home Care Consult Complete  ?Palliative Care Screening Not Applicable  ?Medication Review Press photographer) Complete  ? ? ? ? ? ?

## 2022-04-11 NOTE — Progress Notes (Signed)
Pt is awaiting Midline placement before discharging home.  ?

## 2022-04-11 NOTE — Discharge Summary (Signed)
?Physician Discharge Summary ?  ?Patient: Yolanda [name] MRN: [id number] DOB: [birth year]  ?Admit date:     04/05/2022  ?Discharge date: 04/11/22  ?Discharge Physician: Barton Dubois  ? ?PCP: Pablo Lawrence, NP  ? ?Recommendations at discharge:  ?Repeat basic metabolic panel to follow electrolytes and renal function ?Reassess blood pressure and adjust antihypertensive treatment as needed ?Assure complete resolution of patient's pyelonephritis symptoms after antibiotic completion. ? ?Discharge Diagnoses: ?Principal Problem: ?  Severe sepsis ([place]) ?Active Problems: ?  Essential hypertension, benign ?  Bipolar disorder ([place]) ?  GERD (gastroesophageal reflux disease) ?  Mixed hyperlipidemia ?  Urinary tract infection, site not specified ?  Chronic diastolic CHF (congestive heart failure) ([place]) ?  Hyperglycemia ?  Sepsis secondary to UTI [facility]) ?  Altered mental status ?  Pseudohyponatremia ?  Hypokalemia ?  Hypoalbuminemia due to protein-calorie malnutrition ([place]) ?  Acute pyelonephritis ? ?Brief admission narrative course: ?As Yolanda [name] is a 51 y.o. female with medical history significant of type II DM, systolic CHF, bipolar disorder and seizure who presents to the emergency department accompanied by her husband due to several days of change in mental status and confusion.  Patient states that her mental status has improved since arrival to the ED, she states that she went to pick up her husband from work about 2 days ago, but she got confused and was driving everywhere to finding, she ended up about 20 miles away.  She also complained of 1 to 2-day onset of worsening urinary incontinence and burning sensation or urination.  Husband decided that patient needs to go to the ED for further evaluation and management. ? ?ED course: ?In the emergency department, she was febrile with a temperature of 103.34F, tachypneic, tachycardic, but O2 sat was 92-90% on room air.  Work-up in the ED showed normal CBC except for  leukocytosis, BMP showed hyponatremia, hypokalemia, bicarb of 17, hyperglycemia urinalysis was positive for UTI, urine drug screen was negative, lactic acid was normal.  Influenza A, B, SARS coronavirus 2 was negative. ?Chest x-ray showed no active disease ?Tylenol was given, patient was started on IV ceftriaxone, IV hydration was provided per sepsis protocol.  Hospitalist was asked to admit patient for further evaluation and management.per H&P by Dr. Josephine Cables on 04/06/2022 ? ?Assessment and Plan: ?  ?Severe sepsis secondary to pyelonephritis and bacteremia due to E. coli/Klebsiella pneumoniae ?-Sepsis present on admission, with encephalopathy ?-CT abdomen pelvis revealed left perinephric edema, suspicious for pyelonephritis ?-positive blood cultures for gram neg rods; most likely due to same microorganisms. ?-Medically stable for discharge  ?-E. Coli and K. Pneumoniae; with findings suggesting ESBL. ?-Advised to maintain adequate hydration. ?-Patient overall feeling and currently afebrile; no nausea or vomiting. ?-Case discussed with infectious disease service (Dr. Juleen China) recommendations given for 5 more days of IV abx's using ertapenem provided. ?  ?Acute metabolic encephalopathy ?-Secondary to above; improved/resolved. ?-CT head negative for acute intracranial abnormality ?-Continue constant reorientation and supportive care. ?-Mentation is back to baseline. ?  ?Diarrhea ?-C. difficile and GI PCR panel negative. ?-Appears to be functional/may be associated with antibiotics. ?-Continue Florastor. ?-reports improvement and no further episodes of diarrhea at this point. ?-Patient advised to maintain adequate hydration. ?  ?Chronic diastolic CHF ?-Stable and compensated currently. ?-Resume the use of Entresto and spironolactone; continue also the use of Toprol. ?-Heart healthy/low-sodium diet discussed with patient. ?-Continue to check weight on daily basis and maintain adequate hydration. ?-Continue outpatient  follow-up with cardiology service as previously instructed. ? ?  Diabetes mellitus ?-Resume home hypoglycemic regimen. ?-Modified carbohydrate diet discussed with patient. ?  ?Hypomagnesemia ?-repleted ?-follow trend as an outpatient and further replete if needed. ? ?Hypokalemia ?-Repleted and within normal limits at discharge ?-Continue to follow electrolytes trend/stability with further repletion as required. ?  ?CAD ?-Continue aspirin and Lipitor ?-No chest pain. ?-Metoprolol has been resumed. ?  ?Mood disorder ?-Continue the use of Klonopin,  Paxil, Geodon  ?-Mood is stable overall. ?-Shortness of breath is feeling slightly confused at the time of high-grade temperature, hypertension and tachycardia presented. ?  ?Class 1 obesity ?-Body mass index is 34.46 kg/m?. ?-Low calorie diet, portion control increase physical activity discussed with patient. ? ?Consultants: Infectious disease (Dr. Juleen China). ?Procedures performed: See below for x-ray reports. ?Disposition: Home with home health services. ?Diet recommendation: Modified carbohydrates and heart healthy/low-sodium diet. ? ?DISCHARGE MEDICATION: ?Allergies as of 04/11/2022   ? ?   Reactions  ? Abilify [aripiprazole] Other (See Comments)  ? Altered mental status   ? Sulfa Antibiotics Hives, Rash, Other (See Comments)  ? unknown  ? ?  ? ?  ?Medication List  ?  ? ?STOP taking these medications   ? ?Boric Acid Vaginal 600 MG Supp ?  ?guaiFENesin 600 MG 12 hr tablet ?Commonly known as: Mucinex ?  ?losartan 25 MG tablet ?Commonly known as: COZAAR ?  ?metroNIDAZOLE 0.75 % vaginal gel ?Commonly known as: METROGEL VAGINAL ?  ?metroNIDAZOLE 500 MG tablet ?Commonly known as: FLAGYL ?  ? ?  ? ?TAKE these medications   ? ?acetaminophen 500 MG tablet ?Commonly known as: TYLENOL ?Take 1,500 mg by mouth every 6 (six) hours as needed. ?  ?aspirin 81 MG EC tablet ?Take 1 tablet (81 mg total) by mouth daily. Swallow whole. ?  ?atorvastatin 40 MG tablet ?Commonly known as:  LIPITOR ?Take 40 mg by mouth at bedtime. ?  ?clonazePAM 0.5 MG tablet ?Commonly known as: KLONOPIN ?Take 0.5 mg by mouth 2 (two) times daily as needed for anxiety. ?  ?Entresto 49-51 MG ?Generic drug: sacubitril-valsartan ?TAKE ONE TABLET BY MOUTH TWICE A DAY ?  ?ertapenem  IVPB ?Commonly known as: INVANZ ?Inject 1 g into the vein daily for 5 days. Indication:  ESBL bacteremia ?First Dose: Yes ?Last Day of Therapy:  04/17/22 ?Labs - Once weekly:  CBC/D and BMP, ?Labs - Every other week:  ESR and CRP ?Method of administration: Mini-Bag Plus / Gravity ?Method of administration may be changed at the discretion of home infusion pharmacist based upon assessment of the patient and/or caregiver's ability to self-administer the medication ordered. ?Start taking on: Apr 12, 2022 ?  ?HumaLOG 100 UNIT/ML injection ?Generic drug: insulin lispro ?INJECT 10-16 UNITS TOTAL (0.1 - 0.16 MLSSUBCUTANEOUSLY INTO THE SKIN 3 TIMES A DAY ?What changed: See the new instructions. ?  ?insulin detemir 100 UNIT/ML FlexPen ?Commonly known as: LEVEMIR ?Inject 50 Units into the skin 2 (two) times daily. Spoke to patient by phone and she states that she was taking Levemir 50 units twice a day ?  ?metFORMIN 1000 MG tablet ?Commonly known as: GLUCOPHAGE ?Take 1 tablet (1,000 mg total) by mouth 2 (two) times daily with a meal. ?  ?methocarbamol 500 MG tablet ?Commonly known as: ROBAXIN ?Take 500 mg by mouth 3 (three) times daily. ?  ?metoprolol succinate 50 MG 24 hr tablet ?Commonly known as: TOPROL-XL ?Take 1.5 tablets (75 mg total) by mouth daily. Take with or immediately following a meal. ?What changed: how much to take ?  ?Myrbetriq 50 MG  Tb24 tablet ?Generic drug: mirabegron ER ?TAKE ONE TABLET (50MG TOTAL) BY MOUTH DAILY AT BEDTIME ?What changed: See the new instructions. ?  ?nystatin powder ?Commonly known as: MYCOSTATIN/NYSTOP ?Apply topically 3 (three) times daily. ?  ?omeprazole 40 MG capsule ?Commonly known as: PRILOSEC ?Take 40 mg by mouth  daily. ?  ?OneTouch Verio test strip ?Generic drug: glucose blood ?Test glucose 5 times a day ?  ?Oxycodone HCl 10 MG Tabs ?Take 10 mg by mouth every 6 (six) hours as needed for pain. ?  ?Ozempic (0.25 or 0.5 MG/DOSE) 2 MG/

## 2022-04-11 NOTE — Progress Notes (Signed)
22 g iv was to left anterior wrist, taken out prior to dc with cath in tact. Nad. Area bandaged.  ?

## 2022-04-15 ENCOUNTER — Telehealth: Payer: Self-pay

## 2022-04-15 NOTE — Telephone Encounter (Signed)
Bryson Ha, RN from [facility] called to provide an update that patient has missed several doses of IV antibiotics, due to lack of support. ? ?Bryson Ha states their agency will be going out to see the patient today or tomorrow to get labs and provide additional support and teaching. States she will call again if problems continue. ? ?Will route information to Dr. Juleen China. ? ?Binnie Kand, RN  ?

## 2022-04-16 NOTE — Telephone Encounter (Signed)
Sounds good - thanks Jinny Blossom!

## 2022-04-16 NOTE — Telephone Encounter (Signed)
Spoke with Dr. Juleen China, made him aware that patient has missed several doses and does not have follow up scheduled. He is okay with having the PICC line removed - sent order to [facility].  ? ?Beryle Flock, RN ? ?

## 2022-04-22 DIAGNOSIS — E1165 Type 2 diabetes mellitus with hyperglycemia: Secondary | ICD-10-CM | POA: Insufficient documentation

## 2022-05-07 ENCOUNTER — Encounter: Payer: Self-pay | Admitting: Cardiology

## 2022-05-07 ENCOUNTER — Ambulatory Visit (INDEPENDENT_AMBULATORY_CARE_PROVIDER_SITE_OTHER): Payer: Medicaid Other | Admitting: Cardiology

## 2022-05-07 VITALS — BP 118/90 | HR 92 | Ht 67.0 in | Wt 207.4 lb

## 2022-05-07 DIAGNOSIS — I5181 Takotsubo syndrome: Secondary | ICD-10-CM | POA: Diagnosis not present

## 2022-05-07 DIAGNOSIS — E782 Mixed hyperlipidemia: Secondary | ICD-10-CM | POA: Diagnosis not present

## 2022-05-07 DIAGNOSIS — I2510 Atherosclerotic heart disease of native coronary artery without angina pectoris: Secondary | ICD-10-CM

## 2022-05-07 MED ORDER — LOSARTAN POTASSIUM 25 MG PO TABS: 12.5000 mg | ORAL_TABLET | Freq: Every day | ORAL | 6 refills | Status: DC

## 2022-05-07 NOTE — Patient Instructions (Addendum)
Medication Instructions:  Begin Losartan 12.43m daily  Continue all other medications.     Labwork: FLP - order given today Reminder:  Nothing to eat or drink after 12 midnight prior to labs. Office will contact with results via phone or letter.     Testing/Procedures: none  Follow-Up: 6 months   Any Other Special Instructions Will Be Listed Below (If Applicable).   If you need a refill on your cardiac medications before your next appointment, please call your pharmacy.
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
## 2022-05-09 LAB — LIPID PANEL
Cholesterol: 127 mg/dL (ref <200)
HDL: 49 mg/dL — ABNORMAL LOW (ref >=50)
LDL Cholesterol (Calc): 55 mg/dL (calc)
Non-HDL Cholesterol (Calc): 78 mg/dL (calc) (ref <130)
Total CHOL/HDL Ratio: 2.6 (calc) (ref <5.0)
Triglycerides: 152 mg/dL — ABNORMAL HIGH (ref <150)

## 2022-05-29 ENCOUNTER — Other Ambulatory Visit (HOSPITAL_COMMUNITY): Payer: Self-pay | Admitting: Adult Health Nurse Practitioner

## 2022-05-29 DIAGNOSIS — Z1231 Encounter for screening mammogram for malignant neoplasm of breast: Secondary | ICD-10-CM

## 2022-06-10 ENCOUNTER — Ambulatory Visit: Payer: Medicaid Other | Admitting: Podiatry

## 2022-06-21 ENCOUNTER — Ambulatory Visit (HOSPITAL_COMMUNITY)
Admission: RE | Admit: 2022-06-21 | Discharge: 2022-06-21 | Disposition: A | Discharge status: Home / Self Care | Payer: Medicaid Other | Source: Ambulatory Visit | Attending: Adult Health Nurse Practitioner | Admitting: Adult Health Nurse Practitioner

## 2022-06-21 DIAGNOSIS — Z1231 Encounter for screening mammogram for malignant neoplasm of breast: Secondary | ICD-10-CM | POA: Insufficient documentation

## 2022-07-10 ENCOUNTER — Encounter: Payer: Self-pay | Admitting: Podiatry

## 2022-07-10 ENCOUNTER — Ambulatory Visit (INDEPENDENT_AMBULATORY_CARE_PROVIDER_SITE_OTHER): Payer: Medicaid Other | Admitting: Podiatry

## 2022-07-10 DIAGNOSIS — E1149 Type 2 diabetes mellitus with other diabetic neurological complication: Secondary | ICD-10-CM | POA: Diagnosis not present

## 2022-07-10 DIAGNOSIS — L97411 Non-pressure chronic ulcer of right heel and midfoot limited to breakdown of skin: Secondary | ICD-10-CM

## 2022-07-10 DIAGNOSIS — E1140 Type 2 diabetes mellitus with diabetic neuropathy, unspecified: Secondary | ICD-10-CM

## 2022-07-11 NOTE — Progress Notes (Signed)
Subjective:   Patient ID: Yolanda [name], female   DOB: 51 y.o.   MRN: [id number]   HPI Patient presents with history of ulceration underneath the foot stating that she cannot see it and she is worried about the action thinking that it might of broken down.  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  Patient right now is stable will be seen back as needed

## 2022-07-17 ENCOUNTER — Encounter: Payer: Self-pay | Admitting: Emergency Medicine

## 2022-07-17 ENCOUNTER — Ambulatory Visit
Admission: EM | Admit: 2022-07-17 | Discharge: 2022-07-17 | Disposition: A | Discharge status: Home / Self Care | Payer: Medicaid Other | Attending: Nurse Practitioner | Admitting: Nurse Practitioner

## 2022-07-17 DIAGNOSIS — R109 Unspecified abdominal pain: Secondary | ICD-10-CM

## 2022-07-17 DIAGNOSIS — R3915 Urgency of urination: Secondary | ICD-10-CM | POA: Insufficient documentation

## 2022-07-17 LAB — POCT URINALYSIS DIP (MANUAL ENTRY)
Glucose, UA: 100 mg/dL — AB
Nitrite, UA: POSITIVE — AB
Protein Ur, POC: 100 mg/dL — AB
Spec Grav, UA: <=1.005 — AB (ref 1.010–1.025)
Urobilinogen, UA: 4 E.U./dL — AB
pH, UA: 5 (ref 5.0–8.0)

## 2022-07-17 MED ORDER — AMOXICILLIN-POT CLAVULANATE 875-125 MG PO TABS: 1.0000 | ORAL_TABLET | Freq: Two times a day (BID) | ORAL | 0 refills | Status: AC
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
## 2022-07-17 NOTE — ED Triage Notes (Signed)
Burning on urination and lower ABD pain with urinary frequency since Sunday.  Has been taking AZO to help with symptoms.

## 2022-07-17 NOTE — Discharge Instructions (Addendum)
-   I am concerned you have a urinary tract infection.  Please start Augmentin twice daily while we are awaiting the culture results - If your symptoms worsen and you develop fever, body aches, chills, severe back pain, or vomiting, go to the ED immediately

## 2022-07-19 ENCOUNTER — Telehealth (HOSPITAL_COMMUNITY): Payer: Self-pay | Admitting: Emergency Medicine

## 2022-07-19 LAB — URINE CULTURE: Culture: >=100000 — AB

## 2022-07-19 MED ORDER — NITROFURANTOIN MONOHYD MACRO 100 MG PO CAPS: 100.0000 mg | ORAL_CAPSULE | Freq: Two times a day (BID) | ORAL | 0 refills | Status: DC

## 2022-08-06 ENCOUNTER — Other Ambulatory Visit: Payer: Self-pay

## 2022-08-06 ENCOUNTER — Emergency Department (HOSPITAL_COMMUNITY): Payer: Medicaid Other

## 2022-08-06 ENCOUNTER — Encounter (HOSPITAL_COMMUNITY): Payer: Self-pay

## 2022-08-06 ENCOUNTER — Emergency Department (HOSPITAL_COMMUNITY)
Admission: EM | Admit: 2022-08-06 | Discharge: 2022-08-06 | Disposition: A | Discharge status: Home / Self Care | Payer: Medicaid Other | Attending: Emergency Medicine | Admitting: Emergency Medicine

## 2022-08-06 DIAGNOSIS — Z7984 Long term (current) use of oral hypoglycemic drugs: Secondary | ICD-10-CM | POA: Diagnosis not present

## 2022-08-06 DIAGNOSIS — I700 Atherosclerosis of aorta: Secondary | ICD-10-CM | POA: Diagnosis not present

## 2022-08-06 DIAGNOSIS — I10 Essential (primary) hypertension: Secondary | ICD-10-CM | POA: Diagnosis not present

## 2022-08-06 DIAGNOSIS — Z794 Long term (current) use of insulin: Secondary | ICD-10-CM | POA: Diagnosis not present

## 2022-08-06 DIAGNOSIS — Z7982 Long term (current) use of aspirin: Secondary | ICD-10-CM | POA: Diagnosis not present

## 2022-08-06 DIAGNOSIS — E119 Type 2 diabetes mellitus without complications: Secondary | ICD-10-CM | POA: Diagnosis not present

## 2022-08-06 DIAGNOSIS — K5900 Constipation, unspecified: Secondary | ICD-10-CM | POA: Diagnosis not present

## 2022-08-06 DIAGNOSIS — Z79899 Other long term (current) drug therapy: Secondary | ICD-10-CM | POA: Insufficient documentation

## 2022-08-06 DIAGNOSIS — R1013 Epigastric pain: Secondary | ICD-10-CM | POA: Diagnosis present

## 2022-08-06 LAB — COMPREHENSIVE METABOLIC PANEL
ALT: 19 U/L (ref 0–44)
AST: 14 U/L — ABNORMAL LOW (ref 15–41)
Albumin: 4.1 g/dL (ref 3.5–5.0)
Alkaline Phosphatase: 99 U/L (ref 38–126)
Anion gap: 11 (ref 5–15)
BUN: 12 mg/dL (ref 6–20)
CO2: 23 mmol/L (ref 22–32)
Calcium: 9.4 mg/dL (ref 8.9–10.3)
Chloride: 102 mmol/L (ref 98–111)
Creatinine, Ser: 0.62 mg/dL (ref 0.44–1.00)
GFR, Estimated: >60 mL/min (ref >60)
Glucose, Bld: 248 mg/dL — ABNORMAL HIGH (ref 70–99)
Potassium: 4 mmol/L (ref 3.5–5.1)
Sodium: 136 mmol/L (ref 135–145)
Total Bilirubin: 0.6 mg/dL (ref 0.3–1.2)
Total Protein: 7.8 g/dL (ref 6.5–8.1)

## 2022-08-06 LAB — CBC
HCT: 46.3 % — ABNORMAL HIGH (ref 36.0–46.0)
Hemoglobin: 15.1 g/dL — ABNORMAL HIGH (ref 12.0–15.0)
MCH: 27.2 pg (ref 26.0–34.0)
MCHC: 32.6 g/dL (ref 30.0–36.0)
MCV: 83.4 fL (ref 80.0–100.0)
Platelets: 333 10*3/uL (ref 150–400)
RBC: 5.55 MIL/uL — ABNORMAL HIGH (ref 3.87–5.11)
RDW: 15 % (ref 11.5–15.5)
WBC: 11.3 10*3/uL — ABNORMAL HIGH (ref 4.0–10.5)
nRBC: 0 % (ref 0.0–0.2)

## 2022-08-06 LAB — URINALYSIS, ROUTINE W REFLEX MICROSCOPIC
Bilirubin Urine: NEGATIVE
Glucose, UA: 50 mg/dL — AB
Hgb urine dipstick: NEGATIVE
Ketones, ur: NEGATIVE mg/dL
Nitrite: NEGATIVE
Protein, ur: NEGATIVE mg/dL
Specific Gravity, Urine: 1.009 (ref 1.005–1.030)
pH: 6 (ref 5.0–8.0)

## 2022-08-06 LAB — LIPASE, BLOOD: Lipase: 43 U/L (ref 11–51)

## 2022-08-06 MED ORDER — FAMOTIDINE IN NACL 20-0.9 MG/50ML-% IV SOLN
20.0000 mg | Freq: Once | INTRAVENOUS | Status: AC
  Administered 2022-08-06: 20 mg via INTRAVENOUS
  Filled 2022-08-06: qty 50

## 2022-08-06 MED ORDER — IOHEXOL 300 MG/ML  SOLN
100.0000 mL | Freq: Once | INTRAMUSCULAR | Status: AC | PRN
  Administered 2022-08-06: 100 mL via INTRAVENOUS

## 2022-08-06 MED ORDER — OMEPRAZOLE 20 MG PO CPDR: 20.0000 mg | DELAYED_RELEASE_CAPSULE | Freq: Every day | ORAL | 0 refills | Status: DC

## 2022-08-06 MED ORDER — ONDANSETRON HCL 4 MG/2ML IJ SOLN
4.0000 mg | Freq: Once | INTRAMUSCULAR | Status: AC
  Administered 2022-08-06: 4 mg via INTRAVENOUS
  Filled 2022-08-06: qty 2

## 2022-08-06 MED ORDER — MORPHINE SULFATE (PF) 4 MG/ML IV SOLN
4.0000 mg | Freq: Once | INTRAVENOUS | Status: AC
  Administered 2022-08-06: 4 mg via INTRAVENOUS
  Filled 2022-08-06: qty 1

## 2022-08-06 MED ORDER — POLYETHYLENE GLYCOL 3350 17 G PO PACK: 17.0000 g | PACK | Freq: Every day | ORAL | 0 refills | Status: DC

## 2022-08-06 MED ORDER — SODIUM CHLORIDE 0.9 % IV BOLUS
1000.0000 mL | Freq: Once | INTRAVENOUS | Status: AC
  Administered 2022-08-06: 1000 mL via INTRAVENOUS

## 2022-08-06 NOTE — ED Notes (Signed)
Patient transported to CT 

## 2022-08-06 NOTE — ED Provider Notes (Signed)
[facility] EMERGENCY DEPARTMENT Provider Note   CSN: [id number] Arrival date & time: 08/06/22  3810     History  Chief Complaint  Patient presents with   Abdominal Pain    Yolanda [name] is a 51 y.o. female.  Pt is a 51 yo female with a pmhx significant for gerd, anxiety, htn, sleep apnea on cpap, dm2, hld, bipolar d/o, borderline personality d/o, chf, and chronic pain. Pt said she has upper abdominal pain.  Sx started 2 days ago.  She denies n/v/d or constipation.  She said the pain happens a lot.  She has an appt with gi next week.  No f/c.         Home Medications Prior to Admission medications   Medication Sig Start Date End Date Taking? Authorizing Provider  omeprazole (PRILOSEC) 20 MG capsule Take 1 capsule (20 mg total) by mouth daily. 08/06/22  Yes Jacalyn Lefevre, MD  polyethylene glycol (MIRALAX) 17 g packet Take 17 g by mouth daily. 08/06/22  Yes Jacalyn Lefevre, MD  acetaminophen (TYLENOL) 500 MG tablet Take 1,500 mg by mouth every 6 (six) hours as needed.    [provider]  aspirin EC 81 MG EC tablet Take 1 tablet (81 mg total) by mouth daily. Swallow whole. 09/23/20   Marinda Elk, MD  atorvastatin (LIPITOR) 40 MG tablet Take 40 mg by mouth at bedtime. 06/21/20   [provider]  clonazePAM (KLONOPIN) 0.5 MG tablet Take 0.5 mg by mouth 2 (two) times daily as needed for anxiety.  07/04/20   [provider]  glucose blood (ONETOUCH VERIO) test strip Test glucose 5 times a day 11/10/20   Roma Kayser, MD  HUMALOG 100 UNIT/ML injection INJECT 10-16 UNITS TOTAL (0.1 - 0.16 MLSSUBCUTANEOUSLY INTO THE SKIN 3 TIMES A DAY Patient taking differently: 10-16 Units 3 (three) times daily with meals. 03/15/21   Roma Kayser, MD  insulin detemir (LEVEMIR) 100 UNIT/ML FlexPen Inject 50 Units into the skin 2 (two) times daily. Spoke to patient by phone and she states that she was taking Levemir 50 units twice a day 04/11/22   Vassie Loll,  MD  losartan (COZAAR) 25 MG tablet Take 0.5 tablets (12.5 mg total) by mouth daily. 05/07/22   Antoine Poche, MD  metFORMIN (GLUCOPHAGE) 1000 MG tablet Take 1 tablet (1,000 mg total) by mouth 2 (two) times daily with a meal. 09/27/20   [name], [name], MD  methocarbamol (ROBAXIN) 500 MG tablet Take 500 mg by mouth 3 (three) times daily. 02/14/21   [provider]  metoprolol succinate (TOPROL-XL) 50 MG 24 hr tablet Take 1.5 tablets (75 mg total) by mouth daily. Take with or immediately following a meal. Patient taking differently: Take 50 mg by mouth daily. Take with or immediately following a meal. 04/02/21 05/08/23  Dyann Kief, Yolanda-C  MYRBETRIQ 50 MG TB24 tablet TAKE ONE TABLET (50MG  TOTAL) BY MOUTH DAILY AT BEDTIME Patient taking differently: Take 50 mg by mouth at bedtime. 08/20/21   08/22/21, MD  nitrofurantoin, macrocrystal-monohydrate, (MACROBID) 100 MG capsule Take 1 capsule (100 mg total) by mouth 2 (two) times daily. 07/19/22   Lamptey8/20/23, MD  nystatin (MYCOSTATIN/NYSTOP) powder Apply topically 3 (three) times daily. 12/15/21   12/17/21, MD  Oxycodone HCl 10 MG TABS Take 10 mg by mouth every 6 (six) hours as needed for pain. 06/10/20   [provider]  OZEMPIC, 0.25 OR 0.5 MG/DOSE, 2 MG/1.5ML SOPN  Inject 0.5 mg into the skin once a week. Sunday 07/04/20   [provider]  PARoxetine (PAXIL) 30 MG tablet Take 60 mg by mouth at bedtime.  06/21/20   [provider]  pregabalin (LYRICA) 300 MG capsule Take 300 mg by mouth every 12 (twelve) hours. 06/03/20   [provider]  saccharomyces boulardii (FLORASTOR) 250 MG capsule Take 1 capsule (250 mg total) by mouth 2 (two) times daily. 04/11/22   Vassie Loll, MD  spironolactone (ALDACTONE) 25 MG tablet Take 0.5 tablets (12.5 mg total) by mouth daily. 09/23/20   Marinda Elk, MD  TROKENDI XR 200 MG CP24 Take 200 mg by mouth daily.  07/04/20   [provider]   Vitamin D, Ergocalciferol, (DRISDOL) 1.25 MG (50000 UNIT) CAPS capsule Take 1 capsule (50,000 Units total) by mouth every 7 (seven) days. Patient not taking: Reported on 12/13/2021 03/13/21   Roma Kayser, MD  ziprasidone (GEODON) 80 MG capsule Take 80 mg by mouth every evening. 11/06/21   [provider]      Allergies    Abilify [aripiprazole] and Sulfa antibiotics    Review of Systems   Review of Systems  Gastrointestinal:  Positive for abdominal pain.  All other systems reviewed and are negative.   Physical Exam Updated Vital Signs BP 116/86 (BP Location: Left Arm)   Pulse 82   Temp 97.9 F (36.6 C) (Oral)   Resp 16   Ht 5\' 7"  (1.702 m)   Wt 80.3 kg   LMP 07/02/2022 (Approximate)   SpO2 95%   BMI 27.72 kg/m  Physical Exam Vitals and nursing note reviewed.  Constitutional:      Appearance: She is well-developed. She is obese.  HENT:     Head: Normocephalic and atraumatic.     Mouth/Throat:     Mouth: Mucous membranes are moist.     Pharynx: Oropharynx is clear.  Eyes:     Extraocular Movements: Extraocular movements intact.     Pupils: Pupils are equal, round, and reactive to light.  Cardiovascular:     Rate and Rhythm: Normal rate and regular rhythm.     Heart sounds: Normal heart sounds.  Pulmonary:     Effort: Pulmonary effort is normal.     Breath sounds: Normal breath sounds.  Abdominal:     General: Abdomen is flat. Bowel sounds are decreased.     Palpations: Abdomen is soft.     Tenderness: There is abdominal tenderness in the epigastric area.  Skin:    General: Skin is warm and dry.     Capillary Refill: Capillary refill takes less than 2 seconds.  Neurological:     General: No focal deficit present.     Mental Status: She is alert and oriented to person, place, and time.  Psychiatric:        Mood and Affect: Mood normal.        Behavior: Behavior normal.     ED Results / Procedures / Treatments   Labs (all labs ordered are  listed, but only abnormal results are displayed) Labs Reviewed  COMPREHENSIVE METABOLIC PANEL - Abnormal; Notable for the following components:      Result Value   Glucose, Bld 248 (*)    AST 14 (*)    All other components within normal limits  CBC - Abnormal; Notable for the following components:   WBC 11.3 (*)    RBC 5.55 (*)    Hemoglobin 15.1 (*)  HCT 46.3 (*)    All other components within normal limits  URINALYSIS, ROUTINE W REFLEX MICROSCOPIC - Abnormal; Notable for the following components:   Glucose, UA 50 (*)    Leukocytes,Ua LARGE (*)    Bacteria, UA RARE (*)    All other components within normal limits  LIPASE, BLOOD    EKG EKG Interpretation  Date/Time:  Tuesday August 06 2022 08:54:35 EDT Ventricular Rate:  98 PR Interval:  176 QRS Duration: 86 QT Interval:  372 QTC Calculation: 474 R Axis:   -25 Text Interpretation: Normal sinus rhythm Abnormal QRS-T angle, consider primary T wave abnormality Abnormal ECG When compared with ECG of 05-Apr-2022 22:06, PREVIOUS ECG IS PRESENT Since last tracing rate slower Confirmed by Isla Pence [phone]) on 08/06/2022 9:22:55 AM  Radiology CT ABDOMEN PELVIS W CONTRAST  Result Date: 08/06/2022 CLINICAL DATA:  Two days of upper abdominal pain. EXAM: CT ABDOMEN AND PELVIS WITH CONTRAST TECHNIQUE: Multidetector CT imaging of the abdomen and pelvis was performed using the standard protocol following bolus administration of intravenous contrast. RADIATION DOSE REDUCTION: This exam was performed according to the departmental dose-optimization program which includes automated exposure control, adjustment of the mA and/or kV according to patient size and/or use of iterative reconstruction technique. CONTRAST:  177mL OMNIPAQUE IOHEXOL 300 MG/ML  SOLN COMPARISON:  Multiple priors including most recent CT Apr 06, 2022 FINDINGS: Lower chest: No acute abnormality. Mild symmetric distal esophageal wall thickening. Hepatobiliary: No suspicious  hepatic lesion. Gallbladder surgically absent. Mild prominence of the biliary tree similar prior favored reservoir effect post cholecystectomy. Pancreas: No pancreatic ductal dilation or evidence of acute inflammation. Spleen: No splenomegaly. Adrenals/Urinary Tract: Bilateral adrenal glands appear normal. No hydronephrosis. Bilateral cortical renal scarring. Nonobstructive punctate left interpolar renal stone measures 2 mm. Urinary bladder is unremarkable for degree of distension. Stomach/Bowel: No radiopaque enteric contrast material was administered. Stomach is unremarkable for degree of distension. No pathologic dilation of small or large bowel. The appendix is surgically absent inflammation. Proximal colon is distended with stool. No evidence of acute bowel inflammation. Vascular/Lymphatic: Aortic atherosclerosis. No abdominal aortic aneurysm. No pathologically enlarged abdominal or pelvic lymph nodes. Reproductive: Uterus and bilateral adnexa are unremarkable. Other: No significant abdominopelvic free fluid. Lentiform peripheral calcified density in the left abdominal wall subcutaneous tissues suggest remote prior injury. Similar ovoid soft tissue density in the right abdominal wall subcutaneous tissues without calcifications. Musculoskeletal: Multilevel degenerative changes spine with flowing anterior vertebral osteophytes in the thoracolumbar spine. Partially visualized right femoral intramedullary nail fixation. No acute osseous finding. IMPRESSION: 1. No acute finding in the abdomen or pelvis identified. 2. Proximal colon is distended with stool suggestive of constipation. 3. Punctate nonobstructive left interpolar renal stone. 4.  Aortic Atherosclerosis (ICD10-I70.0). Electronically Signed   By: Dahlia Bailiff M.D.   On: 08/06/2022 14:18    Procedures Procedures    Medications Ordered in ED Medications  sodium chloride 0.9 % bolus 1,000 mL (0 mLs Intravenous Stopped 08/06/22 1346)  ondansetron  (ZOFRAN) injection 4 mg (4 mg Intravenous Given 08/06/22 1211)  morphine (PF) 4 MG/ML injection 4 mg (4 mg Intravenous Given 08/06/22 1211)  famotidine (PEPCID) IVPB 20 mg premix (0 mg Intravenous Stopped 08/06/22 1242)  morphine (PF) 4 MG/ML injection 4 mg (4 mg Intravenous Given 08/06/22 1344)  iohexol (OMNIPAQUE) 300 MG/ML solution 100 mL (100 mLs Intravenous Contrast Given 08/06/22 1409)    ED Course/ Medical Decision Making/ A&P  Medical Decision Making Amount and/or Complexity of Data Reviewed Labs: ordered. Radiology: ordered.  Risk Prescription drug management.   This patient presents to the ED for concern of abd pain, this involves an extensive number of treatment options, and is a complaint that carries with it a high risk of complications and morbidity.  The differential diagnosis includes gastritis, pancreatitis, cholecystitis, uti   Co morbidities that complicate the patient evaluation  gerd, anxiety, htn, sleep apnea on cpap, dm2, hld, bipolar d/o, borderline personality d/o, chf, and chronic pain   Additional history obtained:  Additional history obtained from epic chart review    Lab Tests:  I Ordered, and personally interpreted labs.  The pertinent results include:  cbc nl, cmp with glucose elevated at 248, ua nl   Imaging Studies ordered:  I ordered imaging studies including ct abd/pelvis  I independently visualized and interpreted imaging which showed  IMPRESSION:  1. No acute finding in the abdomen or pelvis identified.  2. Proximal colon is distended with stool suggestive of  constipation.  3. Punctate nonobstructive left interpolar renal stone.  4.  Aortic Atherosclerosis (ICD10-I70.0).   I agree with the radiologist interpretation   Cardiac Monitoring:  The patient was maintained on a cardiac monitor.  I personally viewed and interpreted the cardiac monitored which showed an underlying rhythm of: nsr   Medicines ordered  and prescription drug management:  I ordered medication including ivfs and morphine  for dehydration/pain  Reevaluation of the patient after these medicines showed that the patient improved I have reviewed the patients home medicines and have made adjustments as needed   Test Considered:  ct   Critical Interventions:  Pain control  Problem List / ED Course:  Abd pain:  likely due to constipation.  Pt also has frequent epigastric pain.  I am going to put pt back on her ppi and d/c her with miralax.  She is to keep her appt with GI.  Return if worse.    Reevaluation:  After the interventions noted above, I reevaluated the patient and found that they have :improved   Social Determinants of Health:  Lives at home   Dispostion:  After consideration of the diagnostic results and the patients response to treatment, I feel that the patent would benefit from discharge with outpatient f/u.          Final Clinical Impression(s) / ED Diagnoses Final diagnoses:  Epigastric pain  Constipation, unspecified constipation type    Rx / DC Orders ED Discharge Orders          Ordered    omeprazole (PRILOSEC) 20 MG capsule  Daily        08/06/22 1432    polyethylene glycol (MIRALAX) 17 g packet  Daily        08/06/22 1432              Isla Pence, MD 08/06/22 1439

## 2022-08-06 NOTE — ED Triage Notes (Signed)
Pt presents to ED with complaints of upper abdominal pain x 2 days. Denies N/V/D

## 2022-08-28 ENCOUNTER — Other Ambulatory Visit: Payer: Self-pay | Admitting: Neurology

## 2022-08-28 DIAGNOSIS — M5416 Radiculopathy, lumbar region: Secondary | ICD-10-CM

## 2022-09-16 DIAGNOSIS — N3281 Overactive bladder: Secondary | ICD-10-CM | POA: Insufficient documentation

## 2022-09-16 DIAGNOSIS — L219 Seborrheic dermatitis, unspecified: Secondary | ICD-10-CM | POA: Insufficient documentation

## 2022-11-01 ENCOUNTER — Ambulatory Visit: Payer: Medicare Other | Attending: Cardiology | Admitting: Cardiology

## 2022-11-01 ENCOUNTER — Encounter: Payer: Self-pay | Admitting: Cardiology

## 2022-11-01 VITALS — BP 130/78 | HR 102 | Ht 66.0 in | Wt 194.2 lb

## 2022-11-01 DIAGNOSIS — I2510 Atherosclerotic heart disease of native coronary artery without angina pectoris: Secondary | ICD-10-CM | POA: Diagnosis not present

## 2022-11-01 DIAGNOSIS — E782 Mixed hyperlipidemia: Secondary | ICD-10-CM

## 2022-11-01 DIAGNOSIS — I5181 Takotsubo syndrome: Secondary | ICD-10-CM
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
## 2022-11-01 NOTE — Patient Instructions (Addendum)
Medication Instructions:  Continue all current medications.  Labwork: none  Testing/Procedures: FLP - order given today Reminder:  Nothing to eat or drink after 12 midnight prior to labs. Office will contact with results via phone, letter or mychart.     Follow-Up: Your physician wants you to follow up in:  1 year.  You should receive a call from the office when due.  If you don't receive this call, please call our office to schedule the follow up appointment    Any Other Special Instructions Will Be Listed Below (If Applicable).   If you need a refill on your cardiac medications before your next appointment, please call your pharmacy.

## 2022-11-27 ENCOUNTER — Ambulatory Visit (INDEPENDENT_AMBULATORY_CARE_PROVIDER_SITE_OTHER): Payer: Medicare Other | Admitting: Podiatry

## 2022-11-27 DIAGNOSIS — L97411 Non-pressure chronic ulcer of right heel and midfoot limited to breakdown of skin: Secondary | ICD-10-CM | POA: Diagnosis not present

## 2022-11-27 DIAGNOSIS — E1140 Type 2 diabetes mellitus with diabetic neuropathy, unspecified: Secondary | ICD-10-CM

## 2022-11-27 DIAGNOSIS — E1149 Type 2 diabetes mellitus with other diabetic neurological complication: Secondary | ICD-10-CM | POA: Diagnosis not present

## 2022-11-27 MED ORDER — DOXYCYCLINE HYCLATE 100 MG PO TABS: 100.0000 mg | ORAL_TABLET | Freq: Two times a day (BID) | ORAL | 1 refills | Status: DC

## 2022-11-27 NOTE — Progress Notes (Signed)
Subjective:   Patient ID: Yolanda [name], female   DOB: 51 y.o.   MRN: [id number]   HPI Patient states she is developed some redness on the bottom of her right foot and states it has been tender and she does not remember specific injury.  Patient overall has been doing well with her diabetes and her overall health and does not remember other pathology   ROS      Objective:  Physical Exam  Status unchanged with patient found to have inflammation of the submetatarsal area right that is mildly red localized no proximal edema erythema drainage no systemic signs of infection     Assessment:  Appears to be low-grade cellulitis with keratotic lesion plantar right     Plan:  Debridement accomplished no active drainage was noted but as precautionary measure placed on doxycycline for 10 days instructed on usage.  Patient will be seen back if any redness swelling or other pathology occurred

## 2023-01-10 ENCOUNTER — Encounter: Payer: Self-pay | Admitting: Podiatry

## 2023-01-10 ENCOUNTER — Ambulatory Visit (INDEPENDENT_AMBULATORY_CARE_PROVIDER_SITE_OTHER): Payer: 59 | Admitting: Podiatry

## 2023-01-10 DIAGNOSIS — L97411 Non-pressure chronic ulcer of right heel and midfoot limited to breakdown of skin: Secondary | ICD-10-CM | POA: Diagnosis not present

## 2023-01-10 DIAGNOSIS — E1151 Type 2 diabetes mellitus with diabetic peripheral angiopathy without gangrene: Secondary | ICD-10-CM

## 2023-01-10 DIAGNOSIS — M2041 Other hammer toe(s) (acquired), right foot: Secondary | ICD-10-CM | POA: Diagnosis not present

## 2023-01-10 NOTE — Progress Notes (Signed)
Subjective:   Patient ID: Yolanda [name], female   DOB: 52 y.o.   MRN: [id number]   HPI Patient states she is got better for a while but continues to have problems plantar aspect right foot   ROS      Objective:  Physical Exam  Vascular status diminished no palpable pulses at this point with chronic lesion formation plantar aspect right fourth metatarsal right hallux localized with no other pathology     Assessment:  Chronic lesion formation with slight breakdown of tissue but localized no proximal edema erythema drainage no indications of infective process with possibility for vascular disease and diabetes not in good control     Plan:  Discussed improving diabetic control debrided lesions no no bleeding was noted and I am sending for vascular evaluation as it could be a vascular issue that is causing these chronic conditions with patient doing poorly on diabetic control which puts her at much higher risk of eventual infection

## 2023-01-15 ENCOUNTER — Ambulatory Visit (INDEPENDENT_AMBULATORY_CARE_PROVIDER_SITE_OTHER): Payer: 59

## 2023-01-15 DIAGNOSIS — E1151 Type 2 diabetes mellitus with diabetic peripheral angiopathy without gangrene: Secondary | ICD-10-CM | POA: Diagnosis not present

## 2023-01-15 LAB — VAS US ABI WITH/WO TBI
Left ABI: 1.12
Right ABI: 1.07

## 2023-02-11 ENCOUNTER — Other Ambulatory Visit: Payer: Self-pay | Admitting: Cardiology

## 2023-02-13 DIAGNOSIS — E785 Hyperlipidemia, unspecified: Secondary | ICD-10-CM | POA: Insufficient documentation

## 2023-02-13 DIAGNOSIS — Z8659 Personal history of other mental and behavioral disorders: Secondary | ICD-10-CM | POA: Insufficient documentation

## 2023-04-03 ENCOUNTER — Ambulatory Visit: Payer: 59 | Admitting: Podiatry

## 2023-05-02 ENCOUNTER — Ambulatory Visit (INDEPENDENT_AMBULATORY_CARE_PROVIDER_SITE_OTHER): Payer: 59 | Admitting: Podiatry

## 2023-05-02 ENCOUNTER — Encounter: Payer: Self-pay | Admitting: Podiatry

## 2023-05-02 DIAGNOSIS — L84 Corns and callosities: Secondary | ICD-10-CM

## 2023-05-02 DIAGNOSIS — E1149 Type 2 diabetes mellitus with other diabetic neurological complication: Secondary | ICD-10-CM

## 2023-05-02 DIAGNOSIS — E1140 Type 2 diabetes mellitus with diabetic neuropathy, unspecified: Secondary | ICD-10-CM

## 2023-05-05 NOTE — Progress Notes (Signed)
Subjective:   Patient ID: Yolanda [name], female   DOB: 52 y.o.   MRN: [id number]   HPI Patient presents stating she has had these chronic keratotic lesions that are preulcerative with long-term history of diabetic neuropathic disease   ROS      Objective:  Physical Exam  Vascular status intact neurologically reduced sharp dull vibratory noted with keratotic lesion bilateral that do have dark discoloration consistent with bleeding underneath     Assessment:  High risk patient long-term diabetes keratotic lesions bilateral     Plan:  H&P reviewed debrided lesions bilateral no iatrogenic bleeding reappoint routine care

## 2023-05-14 ENCOUNTER — Ambulatory Visit (INDEPENDENT_AMBULATORY_CARE_PROVIDER_SITE_OTHER): Payer: 59 | Admitting: Podiatry

## 2023-05-14 ENCOUNTER — Encounter: Payer: Self-pay | Admitting: Podiatry

## 2023-05-14 ENCOUNTER — Ambulatory Visit (INDEPENDENT_AMBULATORY_CARE_PROVIDER_SITE_OTHER): Payer: 59

## 2023-05-14 DIAGNOSIS — M7989 Other specified soft tissue disorders: Secondary | ICD-10-CM | POA: Diagnosis not present

## 2023-05-14 DIAGNOSIS — L03032 Cellulitis of left toe: Secondary | ICD-10-CM

## 2023-05-14 DIAGNOSIS — L02612 Cutaneous abscess of left foot: Secondary | ICD-10-CM

## 2023-05-14 MED ORDER — DOXYCYCLINE HYCLATE 100 MG PO TABS
100.0000 mg | ORAL_TABLET | Freq: Two times a day (BID) | ORAL | 1 refills | Status: DC
Start: 2023-05-14 — End: 2023-06-25

## 2023-05-15 NOTE — Progress Notes (Signed)
Subjective:   Patient ID: Yolanda [name], female   DOB: 52 y.o.   MRN: [id number]   HPI Patient presents with caregiver with a red inflamed fourth digit left not sure if she had an injury or what may have occurred but she is concerned because long-term history of diabetes not in good control   ROS      Objective:  Physical Exam  Neurovascular status unchanged from previous visits with the patient showing no systemic signs of infection left fourth toe is red through the entire digit with a distal keratotic lesion no drainage no other pathology noted as far as proximal swelling or other condition.  No history of injury     Assessment:  Possibility that this could be trauma possibility it could be an inflammatory process vascular process or possibility for infective process     Plan:  H&P reviewed and at this point it is nontender so most likely not vascular but cannot rule out.  At this time were going to start her on antibiotic to see what kind of relief this will give her and strict instructions of any changes were to occur color wise or any systemic signs of infection she is to go straight to the emergency.  Patient understands there is chance for loss of toe if symptoms change  X-rays are negative for signs of bony change appears at this point to be soft tissue but still early in the process

## 2023-05-22 ENCOUNTER — Ambulatory Visit: Payer: 59 | Admitting: Obstetrics & Gynecology

## 2023-06-25 ENCOUNTER — Ambulatory Visit
Admission: EM | Admit: 2023-06-25 | Discharge: 2023-06-25 | Disposition: A | Discharge status: Home / Self Care | Payer: 59 | Attending: Family Medicine | Admitting: Family Medicine

## 2023-06-25 ENCOUNTER — Other Ambulatory Visit: Payer: Self-pay

## 2023-06-25 ENCOUNTER — Encounter: Payer: Self-pay | Admitting: Emergency Medicine

## 2023-06-25 DIAGNOSIS — N3090 Cystitis, unspecified without hematuria: Secondary | ICD-10-CM | POA: Diagnosis not present

## 2023-06-25 LAB — POCT URINALYSIS DIP (MANUAL ENTRY)
Bilirubin, UA: NEGATIVE
Glucose, UA: NEGATIVE mg/dL
Ketones, POC UA: NEGATIVE mg/dL
Nitrite, UA: POSITIVE — AB
Protein Ur, POC: 100 mg/dL — AB
Spec Grav, UA: 1.025 (ref 1.010–1.025)
Urobilinogen, UA: 0.2 E.U./dL
pH, UA: 5.5 (ref 5.0–8.0)

## 2023-06-25 MED ORDER — ONDANSETRON 4 MG PO TBDP: 4.0000 mg | ORAL_TABLET | Freq: Three times a day (TID) | ORAL | 0 refills | Status: DC | PRN

## 2023-06-25 MED ORDER — CEPHALEXIN 500 MG PO CAPS: 500.0000 mg | ORAL_CAPSULE | Freq: Two times a day (BID) | ORAL | 0 refills | Status: DC

## 2023-06-25 NOTE — ED Notes (Signed)
Pharmacy called and reported interaction between geodon (home medication) and zofran (discharge medication) prior to pt d/c from [facility]. Provider aware and reported would cancel zofran prescription and send in different medication after reviewing chart.Pharmacy and pt aware.
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
## 2023-06-25 NOTE — ED Triage Notes (Signed)
Pt reports lower abd pain,emesis, diarrhea since last Sunday. Pt reports pain subsided and returned yesterday. Pt reports "I'm also prone to UTI's". Denies any known fevers.

## 2023-06-25 NOTE — ED Notes (Addendum)
Offered fluids x3. Pt unable to void for specimen for urine culture. Provider aware. Urine culture to be cancelled.

## 2023-06-25 NOTE — Discharge Instructions (Signed)
You have had labs (urine culture) sent today. We will call you with any significant abnormalities or if there is need to begin or change treatment or pursue further follow up.  You may also review your test results online through MyChart. If you do not have a MyChart account, instructions to sign up should be on your discharge paperwork.  

## 2023-06-28 ENCOUNTER — Emergency Department (HOSPITAL_COMMUNITY): Payer: 59

## 2023-06-28 ENCOUNTER — Encounter (HOSPITAL_COMMUNITY): Payer: Self-pay

## 2023-06-28 ENCOUNTER — Other Ambulatory Visit: Payer: Self-pay

## 2023-06-28 ENCOUNTER — Emergency Department (HOSPITAL_COMMUNITY)
Admission: EM | Admit: 2023-06-28 | Discharge: 2023-06-28 | Disposition: A | Discharge status: Home / Self Care | Payer: 59 | Attending: Emergency Medicine | Admitting: Emergency Medicine

## 2023-06-28 DIAGNOSIS — Z7984 Long term (current) use of oral hypoglycemic drugs: Secondary | ICD-10-CM | POA: Diagnosis not present

## 2023-06-28 DIAGNOSIS — Z79899 Other long term (current) drug therapy: Secondary | ICD-10-CM | POA: Diagnosis not present

## 2023-06-28 DIAGNOSIS — Z7982 Long term (current) use of aspirin: Secondary | ICD-10-CM | POA: Diagnosis not present

## 2023-06-28 DIAGNOSIS — Z794 Long term (current) use of insulin: Secondary | ICD-10-CM | POA: Diagnosis not present

## 2023-06-28 DIAGNOSIS — N3001 Acute cystitis with hematuria: Secondary | ICD-10-CM | POA: Diagnosis not present

## 2023-06-28 DIAGNOSIS — I509 Heart failure, unspecified: Secondary | ICD-10-CM | POA: Diagnosis not present

## 2023-06-28 DIAGNOSIS — E119 Type 2 diabetes mellitus without complications: Secondary | ICD-10-CM | POA: Diagnosis not present

## 2023-06-28 DIAGNOSIS — R1033 Periumbilical pain: Secondary | ICD-10-CM | POA: Diagnosis present

## 2023-06-28 DIAGNOSIS — R112 Nausea with vomiting, unspecified: Secondary | ICD-10-CM

## 2023-06-28 DIAGNOSIS — I110 Hypertensive heart disease with heart failure: Secondary | ICD-10-CM | POA: Diagnosis not present

## 2023-06-28 LAB — CBC WITH DIFFERENTIAL/PLATELET
Abs Immature Granulocytes: 0.05 10*3/uL (ref 0.00–0.07)
Basophils Absolute: 0.1 10*3/uL (ref 0.0–0.1)
Basophils Relative: 1 %
Eosinophils Absolute: 0.1 10*3/uL (ref 0.0–0.5)
Eosinophils Relative: 1 %
HCT: 47.1 % — ABNORMAL HIGH (ref 36.0–46.0)
Hemoglobin: 16.3 g/dL — ABNORMAL HIGH (ref 12.0–15.0)
Immature Granulocytes: 1 %
Lymphocytes Relative: 27 %
Lymphs Abs: 2.9 10*3/uL (ref 0.7–4.0)
MCH: 27.7 pg (ref 26.0–34.0)
MCHC: 34.6 g/dL (ref 30.0–36.0)
MCV: 80 fL (ref 80.0–100.0)
Monocytes Absolute: 0.6 10*3/uL (ref 0.1–1.0)
Monocytes Relative: 5 %
Neutro Abs: 6.8 10*3/uL (ref 1.7–7.7)
Neutrophils Relative %: 65 %
Platelets: 447 10*3/uL — ABNORMAL HIGH (ref 150–400)
RBC: 5.89 MIL/uL — ABNORMAL HIGH (ref 3.87–5.11)
RDW: 12.9 % (ref 11.5–15.5)
WBC: 10.5 10*3/uL (ref 4.0–10.5)
nRBC: 0 % (ref 0.0–0.2)

## 2023-06-28 LAB — URINALYSIS, W/ REFLEX TO CULTURE (INFECTION SUSPECTED)
Bacteria, UA: NONE SEEN
Bilirubin Urine: NEGATIVE
Glucose, UA: NEGATIVE mg/dL
Hgb urine dipstick: NEGATIVE
Ketones, ur: 5 mg/dL — AB
Nitrite: NEGATIVE
Protein, ur: NEGATIVE mg/dL
Specific Gravity, Urine: 1.035 — ABNORMAL HIGH (ref 1.005–1.030)
pH: 5 (ref 5.0–8.0)

## 2023-06-28 LAB — COMPREHENSIVE METABOLIC PANEL
ALT: 16 U/L (ref 0–44)
AST: 17 U/L (ref 15–41)
Albumin: 4.6 g/dL (ref 3.5–5.0)
Alkaline Phosphatase: 94 U/L (ref 38–126)
Anion gap: 13 (ref 5–15)
BUN: 25 mg/dL — ABNORMAL HIGH (ref 6–20)
CO2: 19 mmol/L — ABNORMAL LOW (ref 22–32)
Calcium: 9.7 mg/dL (ref 8.9–10.3)
Chloride: 100 mmol/L (ref 98–111)
Creatinine, Ser: 1.5 mg/dL — ABNORMAL HIGH (ref 0.44–1.00)
GFR, Estimated: 42 mL/min — ABNORMAL LOW (ref >60)
Glucose, Bld: 111 mg/dL — ABNORMAL HIGH (ref 70–99)
Potassium: 3.3 mmol/L — ABNORMAL LOW (ref 3.5–5.1)
Sodium: 132 mmol/L — ABNORMAL LOW (ref 135–145)
Total Bilirubin: 1.1 mg/dL (ref 0.3–1.2)
Total Protein: 9 g/dL — ABNORMAL HIGH (ref 6.5–8.1)

## 2023-06-28 LAB — LIPASE, BLOOD: Lipase: 39 U/L (ref 11–51)

## 2023-06-28 LAB — POC URINE PREG, ED: Preg Test, Ur: NEGATIVE

## 2023-06-28 MED ORDER — SODIUM CHLORIDE 0.9 % IV BOLUS
1000.0000 mL | Freq: Once | INTRAVENOUS | Status: AC
  Administered 2023-06-28: 1000 mL via INTRAVENOUS

## 2023-06-28 MED ORDER — IOHEXOL 300 MG/ML  SOLN
80.0000 mL | Freq: Once | INTRAMUSCULAR | Status: AC | PRN
  Administered 2023-06-28: 80 mL via INTRAVENOUS

## 2023-06-28 MED ORDER — NITROFURANTOIN MONOHYD MACRO 100 MG PO CAPS: 100.0000 mg | ORAL_CAPSULE | Freq: Two times a day (BID) | ORAL | 0 refills | Status: DC

## 2023-06-28 MED ORDER — ONDANSETRON 4 MG PO TBDP: 4.0000 mg | ORAL_TABLET | Freq: Three times a day (TID) | ORAL | 0 refills | Status: DC | PRN

## 2023-06-28 MED ORDER — ONDANSETRON HCL 4 MG/2ML IJ SOLN
4.0000 mg | Freq: Once | INTRAMUSCULAR | Status: AC
  Administered 2023-06-28: 4 mg via INTRAVENOUS
  Filled 2023-06-28: qty 2

## 2023-06-28 MED ORDER — POTASSIUM CHLORIDE CRYS ER 20 MEQ PO TBCR
40.0000 meq | EXTENDED_RELEASE_TABLET | Freq: Once | ORAL | Status: AC
  Administered 2023-06-28: 40 meq via ORAL
  Filled 2023-06-28: qty 2

## 2023-06-28 NOTE — ED Triage Notes (Signed)
Pt is c/o lower ABD pain that started last sunday, N/V/D started a couple days later. Pt went to urgent care on Wednesday and was diagnosed with a UTI. Pt started ABT Keflex, pt states the pain has not gotten any better. Denies burning with urination. Does have frequency. Pt states she is prone to UTI's.

## 2023-06-28 NOTE — ED Notes (Signed)
Pt is unable to urinate at this time, will try again.

## 2023-06-28 NOTE — ED Provider Notes (Signed)
EMERGENCY DEPARTMENT AT [facility] Provider Note   CSN: [id number] Arrival date & time: 06/28/23  4098     History  Chief Complaint  Patient presents with   Abdominal Pain    Yolanda [name] is a 52 y.o. female.  Patient with history of T2DM, hypertension, CHF, NSTEMI, T2DM, ESBL UTI, bipolar presents today with complaints of abdominal pain. She states that her pain is periumbilical in nature and does not radiate.  She also endorses associated nausea, vomiting, and diarrhea.  States that her symptoms have been ongoing since last Sunday (6 days ago).  She went to urgent care for same last Wednesday and was prescribed Keflex for UTI.  Urgent care was unable to get a culture of her urine at that visit.  She was also given Zofran for nausea and vomiting.  She filled the Keflex and has been taking it but has not been able to keep it down due to nausea and vomiting.  She tried to fill the Zofran but the pharmacy would not let her have it due to concern for reaction with the Geodon she is prescribed. She states that her pain is consistent with pain she normally feels when she gets a UTI, however she has never had nausea, vomiting, and diarrhea with it before. Denies fevers or chills. No flank pain, hematemesis, hematochezia, or melena.  She does note that she has had many UTIs previously and has had sepsis from UTI multiple times in the past as well.  The history is provided by the patient. No language interpreter was used.  Abdominal Pain Associated symptoms: diarrhea, nausea and vomiting        Home Medications Prior to Admission medications   Medication Sig Start Date End Date Taking? Authorizing Provider  acetaminophen (TYLENOL) 500 MG tablet Take 1,500 mg by mouth every 6 (six) hours as needed.    [provider]  aspirin EC 81 MG EC tablet Take 1 tablet (81 mg total) by mouth daily. Swallow whole. 09/23/20   Marinda Elk, MD  atorvastatin (LIPITOR)  40 MG tablet Take 40 mg by mouth at bedtime. 06/21/20   [provider]  cephALEXin (KEFLEX) 500 MG capsule Take 1 capsule (500 mg total) by mouth 2 (two) times daily. 06/25/23   Mardella Layman, MD  clonazePAM (KLONOPIN) 0.5 MG tablet Take 0.5 mg by mouth 2 (two) times daily as needed for anxiety.  07/04/20   [provider]  fluticasone (FLONASE) 50 MCG/ACT nasal spray Place into both nostrils. 02/15/23   [provider]  furosemide (LASIX) 40 MG tablet Take 40 mg by mouth daily.    [provider]  glucose blood (ONETOUCH VERIO) test strip Test glucose 5 times a day 11/10/20   Roma Kayser, MD  HUMALOG 100 UNIT/ML injection INJECT 10-16 UNITS TOTAL (0.1 - 0.16 MLSSUBCUTANEOUSLY INTO THE SKIN 3 TIMES A DAY Patient taking differently: 10-16 Units 3 (three) times daily with meals. 03/15/21   Roma Kayser, MD  insulin detemir (LEVEMIR) 100 UNIT/ML FlexPen Inject 50 Units into the skin 2 (two) times daily. Spoke to patient by phone and she states that she was taking Levemir 50 units twice a day 04/11/22   Vassie Loll, MD  losartan (COZAAR) 25 MG tablet Take 0.5 tablets (12.5 mg total) by mouth daily. 02/11/23   Antoine Poche, MD  metFORMIN (GLUCOPHAGE) 1000 MG tablet Take 1 tablet (1,000 mg total) by mouth 2 (two) times daily with a  meal. 09/27/20   Roma Kayser, MD  methocarbamol (ROBAXIN) 500 MG tablet Take 500 mg by mouth 3 (three) times daily. 02/14/21   [provider]  metoprolol succinate (TOPROL-XL) 50 MG 24 hr tablet Take 1.5 tablets (75 mg total) by mouth daily. Take with or immediately following a meal. Patient taking differently: Take 50 mg by mouth daily. Take with or immediately following a meal. 04/02/21 05/08/23  Dyann Kief, PA-C  MYRBETRIQ 50 MG TB24 tablet TAKE ONE TABLET (50MG  TOTAL) BY MOUTH DAILY AT BEDTIME Patient taking differently: Take 50 mg by mouth at bedtime. 08/20/21   Lazaro Arms, MD  nystatin  (MYCOSTATIN/NYSTOP) powder Apply topically 3 (three) times daily. 12/15/21   Marinda Elk, MD  omeprazole (PRILOSEC) 20 MG capsule Take 1 capsule (20 mg total) by mouth daily. 08/06/22   Jacalyn Lefevre, MD  ondansetron (ZOFRAN-ODT) 4 MG disintegrating tablet Take 1 tablet (4 mg total) by mouth every 8 (eight) hours as needed for nausea or vomiting. 06/25/23   Mardella Layman, MD  oxybutynin (DITROPAN-XL) 5 MG 24 hr tablet Take by mouth. 04/22/23   [provider]  Oxycodone HCl 10 MG TABS Take 10 mg by mouth every 6 (six) hours as needed for pain. 06/10/20   [provider]  OZEMPIC, 0.25 OR 0.5 MG/DOSE, 2 MG/1.5ML SOPN Inject 0.5 mg into the skin once a week. Sunday 07/04/20   [provider]  PARoxetine (PAXIL) 30 MG tablet Take 60 mg by mouth at bedtime.  06/21/20   [provider]  polyethylene glycol (MIRALAX) 17 g packet Take 17 g by mouth daily. 08/06/22   Jacalyn Lefevre, MD  potassium chloride (KLOR-CON) 10 MEQ tablet Take 10 mEq by mouth daily. 03/07/23   [provider]  pregabalin (LYRICA) 300 MG capsule Take 300 mg by mouth every 12 (twelve) hours. 06/03/20   [provider]  saccharomyces boulardii (FLORASTOR) 250 MG capsule Take 1 capsule (250 mg total) by mouth 2 (two) times daily. 04/11/22   Vassie Loll, MD  spironolactone (ALDACTONE) 25 MG tablet Take 0.5 tablets (12.5 mg total) by mouth daily. 09/23/20   Marinda Elk, MD  sucralfate (CARAFATE) 1 g tablet Take by mouth. 12/09/22   [provider]  TROKENDI XR 200 MG CP24 Take 200 mg by mouth daily.  07/04/20   [provider]  Vitamin D, Ergocalciferol, (DRISDOL) 1.25 MG (50000 UNIT) CAPS capsule Take 1 capsule (50,000 Units total) by mouth every 7 (seven) days. Patient not taking: Reported on 12/13/2021 03/13/21   Roma Kayser, MD  ziprasidone (GEODON) 80 MG capsule Take 80 mg by mouth every evening. 11/06/21   [provider]      Allergies     Abilify [aripiprazole], Sulfa antibiotics, and Sulfamethoxazole-trimethoprim    Review of Systems   Review of Systems  Gastrointestinal:  Positive for abdominal pain, diarrhea, nausea and vomiting.  All other systems reviewed and are negative.   Physical Exam Updated Vital Signs BP (!) 135/92 (BP Location: Right Arm)   Pulse (!) 106   Temp 97.8 F (36.6 C)   Resp 19   Ht 5\' 6"  (1.676 m)   Wt 81.6 kg   SpO2 96%   BMI 29.05 kg/m  Physical Exam Vitals and nursing note reviewed.  Constitutional:      General: She is not in acute distress.    Appearance: Normal appearance. She is obese. She is not ill-appearing, toxic-appearing or diaphoretic.  HENT:  Head: Normocephalic and atraumatic.  Cardiovascular:     Rate and Rhythm: Normal rate.  Pulmonary:     Effort: Pulmonary effort is normal. No respiratory distress.  Abdominal:     General: Abdomen is flat.     Palpations: Abdomen is soft.     Tenderness: There is abdominal tenderness in the periumbilical area.  Musculoskeletal:        General: Normal range of motion.     Cervical back: Normal range of motion.  Skin:    General: Skin is warm and dry.  Neurological:     General: No focal deficit present.     Mental Status: She is alert.  Psychiatric:        Mood and Affect: Mood normal.        Behavior: Behavior normal.     ED Results / Procedures / Treatments   Labs (all labs ordered are listed, but only abnormal results are displayed) Labs Reviewed  COMPREHENSIVE METABOLIC PANEL - Abnormal; Notable for the following components:      Result Value   Sodium 132 (*)    Potassium 3.3 (*)    CO2 19 (*)    Glucose, Bld 111 (*)    BUN 25 (*)    Creatinine, Ser 1.50 (*)    Total Protein 9.0 (*)    GFR, Estimated 42 (*)    All other components within normal limits  CBC WITH DIFFERENTIAL/PLATELET - Abnormal; Notable for the following components:   RBC 5.89 (*)    Hemoglobin 16.3 (*)    HCT 47.1 (*)    Platelets  447 (*)    All other components within normal limits  URINALYSIS, W/ REFLEX TO CULTURE (INFECTION SUSPECTED) - Abnormal; Notable for the following components:   APPearance HAZY (*)    Specific Gravity, Urine 1.035 (*)    Ketones, ur 5 (*)    Leukocytes,Ua LARGE (*)    All other components within normal limits  URINE CULTURE  LIPASE, BLOOD  POC URINE PREG, ED    EKG None  Radiology CT ABDOMEN PELVIS W CONTRAST  Result Date: 06/28/2023 CLINICAL DATA:  Recurrent UTI. Abdominal pain. Nausea and vomiting and diarrhea. EXAM: CT ABDOMEN AND PELVIS WITH CONTRAST TECHNIQUE: Multidetector CT imaging of the abdomen and pelvis was performed using the standard protocol following bolus administration of intravenous contrast. RADIATION DOSE REDUCTION: This exam was performed according to the departmental dose-optimization program which includes automated exposure control, adjustment of the mA and/or kV according to patient size and/or use of iterative reconstruction technique. CONTRAST:  80mL OMNIPAQUE IOHEXOL 300 MG/ML  SOLN COMPARISON:  CT 08/06/2022 FINDINGS: Lower chest: There is some breathing motion lung bases. Mild linear opacity seen likely scar or atelectasis. No pleural effusion. Coronary artery calcifications are seen. Please correlate for other coronary risk factors. There is a nodule in the middle lobe measuring 7 mm but this seen on the prior examination and with similar when adjusting for technique. Hepatobiliary: No focal liver abnormality is seen. Status post cholecystectomy. No biliary dilatation. Patent portal vein. Pancreas: Mild pancreatic atrophy.  No pancreatic mass. Spleen: Normal in size without focal abnormality. Adrenals/Urinary Tract: Left adrenal gland is preserved. 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 No enhancing renal mass or collecting system dilatation. Lobular contours of the kidneys. Nonobstructing punctate lower pole  left-sided renal stone. The ureters have normal course and caliber down to the bladder. Preserved contours of the urinary bladder.  Stomach/Bowel: Large bowel has a normal course and caliber with scattered stool. There is debris in the stomach. Small bowel is nondilated. Appendix is not well seen in the right lower quadrant but knows areas no pericecal stranding or fluid. Vascular/Lymphatic: Aortic atherosclerosis. No enlarged abdominal or pelvic lymph nodes. Reproductive: Uterus and bilateral adnexa are unremarkable. Other: No free air or free fluid. Musculoskeletal: Scattered degenerative changes of the spine and pelvis. Bridging osteophytes and syndesmophytes throughout the visualized lower thoracic spine. Disc bulging in the lumbar spine with multilevel stenosis. Skin thickening along the anterior right hemipelvis with some stranding. There is also a well circumscribed cystic focus along the anterior right subcutaneous fat along the pelvis. Unchanged from previous. IMPRESSION: No bowel obstruction, free air or free fluid. Nonobstructing left-sided renal stone. No collecting system dilatation. Coronary artery calcifications are seen. Please correlate for other coronary risk factors. 7 mm nodule. Stable from September 2023. Non-contrast chest CT at 6-12 months is recommended. This recommendation follows the consensus statement: Guidelines for Management of Incidental Pulmonary Nodules Detected on CT Images: From the Fleischner Society 2017; Radiology 2017; 284:228-243. Electronically Signed   By: Karen Kays M.D.   On: 06/28/2023 12:40    Procedures Procedures    Medications Ordered in ED Medications  sodium chloride 0.9 % bolus 1,000 mL (0 mLs Intravenous Stopped 06/28/23 1508)  ondansetron (ZOFRAN) injection 4 mg (4 mg Intravenous Given 06/28/23 1029)  iohexol (OMNIPAQUE) 300 MG/ML solution 80 mL (80 mLs Intravenous Contrast Given 06/28/23 1155)  potassium chloride SA (KLOR-CON M) CR tablet 40 mEq (40  mEq Oral Given 06/28/23 1507)    ED Course/ Medical Decision Making/ A&P                             Medical Decision Making Amount and/or Complexity of Data Reviewed Labs: ordered.   This patient is a 52 y.o. female who presents to the ED for concern of abdominal pain, nausea, vomiting, and diarrhea, this involves an extensive number of treatment options, and is a complaint that carries with it a high risk of complications and morbidity. The emergent differential diagnosis prior to evaluation includes, but is not limited to,  The differential diagnosis for generalized abdominal pain includes, but is not limited to UTI, sepsis, gastroenteritis, appendicitis, Bowel obstruction, Bowel perforation. Gastroparesis, DKA, Hernia, Inflammatory bowel disease, mesenteric ischemia, pancreatitis, peritonitis SBP, volvulus.   This is not an exhaustive differential.   Past Medical History / Co-morbidities / Social History: history of T2DM, hypertension, CHF, NSTEMI, T2DM, ESBL UTI, bipolar  Additional history: Chart reviewed. Pertinent results include: Went to urgent care 6 days ago, diagnosed with UTI. Given Keflex and Zofran for same. Previous culture resistant to cephalosporins.  Susceptible to Bactrim and Macrobid.  Physical Exam: Physical exam performed. The pertinent findings include: Periumbilical abdominal TTP.  Lab Tests: I ordered, and personally interpreted labs.  The pertinent results include:  Hgb 16.3, platelets 447, Na 132, K 3.3, bicarb 19, creatinine 1.50. UA infectious, culture pending   Imaging Studies: I ordered imaging studies including CT abdomen pelvis. I independently visualized and interpreted imaging which showed   No bowel obstruction, free air or free fluid.   Nonobstructing left-sided renal stone. No collecting system dilatation.   Coronary artery calcifications are seen. Please correlate for other coronary risk factors.  I agree with the radiologist  interpretation.   Medications: I ordered medication including fluids, zofran, potassium  for dehydration, nausea/vomiting,  and hypokalemia. Reevaluation of the patient after these medicines showed that the patient improved. I have reviewed the patients home medicines and have made adjustments as needed.  Consultations Obtained: I requested consultation with the pharmacy on call,  and discussed lab and imaging findings as well as pertinent plan - they recommend: patient can take PRN zofran with her Geodon given no history of QTc prolongation on EKG. Recommends Macrobid for UTI given culture history, no signs of pyelonephritis and allergy to Bactrim   Disposition: After consideration of the diagnostic results and the patients response to treatment, I feel that emergency department workup does not suggest an emergent condition requiring admission or immediate intervention beyond what has been performed at this time. The plan is: discharge with macrobid for UTI and zofran for nausea/vomiting per pharmacy recommendations. Culture report is pending. No signs of pyelonephritis or urosepsis.  After Zofran, patient is able to eat and drink without any residual episodes of nausea or vomiting.  She is ready to go home. Evaluation and diagnostic testing in the emergency department does not suggest an emergent condition requiring admission or immediate intervention beyond what has been performed at this time.  Plan for discharge with close PCP follow-up.  Patient is understanding and amenable with plan, educated on red flag symptoms that would prompt immediate return.  Patient discharged in stable condition.   Final Clinical Impression(s) / ED Diagnoses Final diagnoses:  Acute cystitis with hematuria  Nausea vomiting and diarrhea    Rx / DC Orders ED Discharge Orders          Ordered    nitrofurantoin, macrocrystal-monohydrate, (MACROBID) 100 MG capsule  2 times daily        06/28/23 1456    ondansetron  (ZOFRAN-ODT) 4 MG disintegrating tablet  Every 8 hours PRN        06/28/23 1456          An After Visit Summary was printed and given to the patient.     Vear Clock 06/28/23 1538    Terrilee Files, MD 06/28/23 [phone]

## 2023-06-28 NOTE — ED Notes (Signed)
Pt tried to provide urine sample, unable to at this time.

## 2023-06-28 NOTE — Discharge Instructions (Signed)
As we discussed, you do still have a urinary tract infection.  In reviewing previous culture reports it looks like that your urine is normally resistant to the antibiotics that urgent care prescribed for you.  Given this, I have added Macrobid which previous culture was to.  Please fill and take this as prescribed in its entirety.  I have also written you a prescription for Zofran which is to take as prescribed as needed for any residual nausea/vomiting. The pharmacist at your pharmacy did note some concerns with this medication, however I have confirmed with our pharmacist that it is safe for you to take with your home meds.   We have gotten a urine culture today.  It takes a few days for this to come back, however if it grows bacteria that is resistant to your antibiotics then you will get a phone call with new recommendations.  Please call your primary doctor to schedule follow-up appointment.  Return if development of any new or worsening symptoms.

## 2023-06-29 LAB — URINE CULTURE: Culture: <10000 — AB

## 2023-08-13 ENCOUNTER — Ambulatory Visit: Payer: 59 | Admitting: Podiatry

## 2023-08-13 ENCOUNTER — Other Ambulatory Visit (HOSPITAL_COMMUNITY): Payer: Self-pay | Admitting: Adult Health Nurse Practitioner

## 2023-08-13 DIAGNOSIS — Z1231 Encounter for screening mammogram for malignant neoplasm of breast: Secondary | ICD-10-CM

## 2023-08-20 ENCOUNTER — Ambulatory Visit (INDEPENDENT_AMBULATORY_CARE_PROVIDER_SITE_OTHER): Payer: 59 | Admitting: Podiatry

## 2023-08-20 ENCOUNTER — Encounter: Payer: Self-pay | Admitting: Podiatry

## 2023-08-20 DIAGNOSIS — L84 Corns and callosities: Secondary | ICD-10-CM | POA: Diagnosis not present

## 2023-08-20 DIAGNOSIS — M79675 Pain in left toe(s): Secondary | ICD-10-CM | POA: Diagnosis not present

## 2023-08-20 DIAGNOSIS — M79674 Pain in right toe(s): Secondary | ICD-10-CM | POA: Diagnosis not present

## 2023-08-20 DIAGNOSIS — B351 Tinea unguium: Secondary | ICD-10-CM | POA: Diagnosis not present

## 2023-08-20 DIAGNOSIS — E1140 Type 2 diabetes mellitus with diabetic neuropathy, unspecified: Secondary | ICD-10-CM | POA: Diagnosis not present

## 2023-08-20 DIAGNOSIS — E1149 Type 2 diabetes mellitus with other diabetic neurological complication: Secondary | ICD-10-CM

## 2023-08-20 NOTE — Progress Notes (Signed)
Subjective:   Patient ID: Yolanda [name], female   DOB: 52 y.o.   MRN: [id number]   HPI Patient is in poor health with severe callus x 3 right elongated nailbeds 1-5 both feet history of ulceration and long-term diabetes    ROS      Objective:  Physical Exam  Neurovascular status unchanged diminishment of sharp dull vibratory with keratotic lesion subfourth metatarsal right third digit right first metatarsal right and elongated nailbeds 1-5 both feet thick yellow and brittle and she cannot cut as they become painful and she is a long-term diabetic     Assessment:  High risk factor with keratotic lesion x 3 and mycotic nail infection with pain 1-5 both feet     Plan:  Debridement of lesions right no iatrogenic bleeding debridement and nailbeds no angiogenic bleeding reappoint routine care

## 2023-08-28 ENCOUNTER — Ambulatory Visit (HOSPITAL_COMMUNITY): Payer: 59

## 2023-09-03 ENCOUNTER — Ambulatory Visit (HOSPITAL_COMMUNITY)
Admission: RE | Admit: 2023-09-03 | Discharge: 2023-09-03 | Disposition: A | Discharge status: Home / Self Care | Payer: 59 | Source: Ambulatory Visit | Attending: Adult Health Nurse Practitioner | Admitting: Adult Health Nurse Practitioner

## 2023-09-03 DIAGNOSIS — Z1231 Encounter for screening mammogram for malignant neoplasm of breast: Secondary | ICD-10-CM | POA: Diagnosis present

## 2023-09-05 ENCOUNTER — Ambulatory Visit: Payer: 59 | Admitting: Obstetrics & Gynecology

## 2023-09-05 DIAGNOSIS — D509 Iron deficiency anemia, unspecified: Secondary | ICD-10-CM | POA: Insufficient documentation

## 2023-09-16 ENCOUNTER — Ambulatory Visit: Payer: 59 | Admitting: Obstetrics & Gynecology

## 2023-09-27 ENCOUNTER — Other Ambulatory Visit: Payer: Self-pay | Admitting: Cardiology

## 2023-09-29 ENCOUNTER — Other Ambulatory Visit: Payer: Self-pay | Admitting: *Deleted

## 2023-10-13 ENCOUNTER — Encounter: Payer: Self-pay | Admitting: Obstetrics & Gynecology

## 2023-10-13 ENCOUNTER — Other Ambulatory Visit (HOSPITAL_COMMUNITY)
Admission: RE | Admit: 2023-10-13 | Discharge: 2023-10-13 | Disposition: A | Discharge status: Home / Self Care | Payer: 59 | Source: Ambulatory Visit | Attending: Obstetrics & Gynecology | Admitting: Obstetrics & Gynecology

## 2023-10-13 ENCOUNTER — Ambulatory Visit (INDEPENDENT_AMBULATORY_CARE_PROVIDER_SITE_OTHER): Payer: 59 | Admitting: Obstetrics & Gynecology

## 2023-10-13 VITALS — BP 102/75 | HR 115 | Ht 66.0 in | Wt 185.5 lb

## 2023-10-13 DIAGNOSIS — Z1151 Encounter for screening for human papillomavirus (HPV): Secondary | ICD-10-CM | POA: Diagnosis not present

## 2023-10-13 DIAGNOSIS — Z01419 Encounter for gynecological examination (general) (routine) without abnormal findings: Secondary | ICD-10-CM | POA: Insufficient documentation

## 2023-10-13 DIAGNOSIS — Z0000 Encounter for general adult medical examination without abnormal findings: Secondary | ICD-10-CM | POA: Diagnosis present

## 2023-10-13 MED ORDER — SOLIFENACIN SUCCINATE 10 MG PO TABS: ORAL_TABLET | ORAL | 11 refills | Status: DC
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
## 2023-10-20 LAB — CYTOLOGY - PAP
Comment: NEGATIVE
Diagnosis: NEGATIVE
High risk HPV: NEGATIVE

## 2023-11-05 ENCOUNTER — Ambulatory Visit
Admission: EM | Admit: 2023-11-05 | Discharge: 2023-11-05 | Disposition: A | Discharge status: Home / Self Care | Payer: 59 | Attending: Family Medicine | Admitting: Family Medicine

## 2023-11-05 DIAGNOSIS — N390 Urinary tract infection, site not specified: Secondary | ICD-10-CM | POA: Diagnosis present

## 2023-11-05 LAB — POCT URINALYSIS DIP (MANUAL ENTRY)
Bilirubin, UA: NEGATIVE
Glucose, UA: >=1000 mg/dL — AB
Ketones, POC UA: NEGATIVE mg/dL
Nitrite, UA: POSITIVE — AB
Protein Ur, POC: 30 mg/dL — AB
Spec Grav, UA: 1.02 (ref 1.010–1.025)
Urobilinogen, UA: 0.2 U/dL
pH, UA: 6 (ref 5.0–8.0)

## 2023-11-05 MED ORDER — CEPHALEXIN 500 MG PO CAPS: 500.0000 mg | ORAL_CAPSULE | Freq: Two times a day (BID) | ORAL | 0 refills | Status: DC
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
## 2023-11-05 NOTE — ED Triage Notes (Signed)
Pt reports abdominal pain x 1 week, pain is in the lower abdomen.

## 2023-11-07 LAB — URINE CULTURE: Culture: >=100000 — AB

## 2023-12-26 ENCOUNTER — Ambulatory Visit: Payer: 59 | Admitting: Podiatry

## 2024-01-05 ENCOUNTER — Ambulatory Visit (INDEPENDENT_AMBULATORY_CARE_PROVIDER_SITE_OTHER): Payer: 59 | Admitting: Podiatry

## 2024-01-05 ENCOUNTER — Encounter: Payer: Self-pay | Admitting: Podiatry

## 2024-01-05 DIAGNOSIS — E1140 Type 2 diabetes mellitus with diabetic neuropathy, unspecified: Secondary | ICD-10-CM

## 2024-01-05 DIAGNOSIS — E1149 Type 2 diabetes mellitus with other diabetic neurological complication: Secondary | ICD-10-CM

## 2024-01-05 DIAGNOSIS — L84 Corns and callosities: Secondary | ICD-10-CM

## 2024-01-05 NOTE — Progress Notes (Signed)
Subjective:   Patient ID: Yolanda [name], female   DOB: 53 y.o.   MRN: [id number]   HPI Patient presented for long-term diabetes with both neurological vascular issues with severe keratotic lesion subfifth metatarsal both feet subthird right   ROS      Objective:  Physical Exam  Neurovascular status diminished both sharp dull vibratory and PT DP pulses with lucent lesions on the fifth metatarsal heads of both feet third metatarsal right     Assessment:  Porokeratotic lesion formation painful with long-term neurological vascular issues     Plan:  Sharp sterile debridement of lesions no iatrogenic bleeding reappoint routine care high risk for doing this

## 2024-03-12 ENCOUNTER — Ambulatory Visit: Payer: 59 | Admitting: Cardiology

## 2024-03-16 ENCOUNTER — Encounter: Payer: Self-pay | Admitting: *Deleted

## 2024-03-18 ENCOUNTER — Ambulatory Visit: Attending: Cardiology | Admitting: Cardiology
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
## 2024-04-14 ENCOUNTER — Ambulatory Visit: Admitting: Podiatry

## 2024-04-19 ENCOUNTER — Ambulatory Visit: Admitting: Podiatry

## 2024-04-19 ENCOUNTER — Encounter: Payer: Self-pay | Admitting: Podiatry

## 2024-04-19 VITALS — Ht 66.0 in | Wt 185.0 lb

## 2024-04-19 DIAGNOSIS — B070 Plantar wart: Secondary | ICD-10-CM

## 2024-04-19 DIAGNOSIS — D489 Neoplasm of uncertain behavior, unspecified: Secondary | ICD-10-CM | POA: Diagnosis not present

## 2024-04-20 NOTE — Progress Notes (Signed)
 Subjective:   Patient ID: Yolanda [name], female   DOB: 53 y.o.   MRN: [id number]   HPI Patient presents with a painful area on the bottom of the right foot and feels like it may be a wart   ROS      Objective:  Physical Exam  Neuro vas scaler status intact with lesions subright lateral foot that upon debridement shows pinpoint bleeding pain to lateral pressure with other lesions that have done well in the past     Assessment:  Probability for verruca plantaris plantar lateral aspect right foot     Plan:  H&P reviewed sterile sharp debridement of the area measuring 1 x 1 cm apply chemical agent to create immune response sterile dressing instructed what to do if any blistering were to occur all questions answered today

## 2024-06-22 ENCOUNTER — Ambulatory Visit: Admission: EM | Admit: 2024-06-22 | Discharge: 2024-06-22 | Discharge status: Against Medical Advice

## 2024-06-22 NOTE — ED Triage Notes (Signed)
Pt reports abdominal pain x 4 days.

## 2024-06-22 NOTE — ED Notes (Addendum)
 Pt attempted to provide urine sample, unable to void at this time. Pt reports finished two bottles of water prior to arrival to UC. Last void this am. Pt reports I've had diarrhea as well.  Pt noted to be pale and mildly clammy post triage.  Discussed pt presentation, vitals, etc. with provider. Provider reported would be more than happy to evaluate pt but symptoms and inability to provide urine sample would likely require a higher level of care, additional imaging. Discussed wait time and pt elected to go to ED prior to provider assessment. Provider aware.

## 2024-06-30 ENCOUNTER — Ambulatory Visit: Admitting: Cardiology

## 2024-09-15 ENCOUNTER — Emergency Department (HOSPITAL_COMMUNITY)

## 2024-09-15 ENCOUNTER — Ambulatory Visit
Admission: EM | Admit: 2024-09-15 | Discharge: 2024-09-15 | Disposition: A | Discharge status: Other Acute Inpatient Hospital | Attending: Nurse Practitioner | Admitting: Nurse Practitioner

## 2024-09-15 ENCOUNTER — Other Ambulatory Visit: Payer: Self-pay

## 2024-09-15 ENCOUNTER — Emergency Department (HOSPITAL_COMMUNITY): Admission: EM | Admit: 2024-09-15 | Discharge: 2024-09-15 | Disposition: A | Discharge status: Home / Self Care

## 2024-09-15 ENCOUNTER — Encounter (HOSPITAL_COMMUNITY): Payer: Self-pay

## 2024-09-15 DIAGNOSIS — R1030 Lower abdominal pain, unspecified: Secondary | ICD-10-CM | POA: Diagnosis present

## 2024-09-15 DIAGNOSIS — Z794 Long term (current) use of insulin: Secondary | ICD-10-CM | POA: Diagnosis not present

## 2024-09-15 DIAGNOSIS — R1084 Generalized abdominal pain: Secondary | ICD-10-CM

## 2024-09-15 DIAGNOSIS — I10 Essential (primary) hypertension: Secondary | ICD-10-CM | POA: Diagnosis not present

## 2024-09-15 DIAGNOSIS — N3001 Acute cystitis with hematuria: Secondary | ICD-10-CM | POA: Diagnosis not present

## 2024-09-15 DIAGNOSIS — Z7984 Long term (current) use of oral hypoglycemic drugs: Secondary | ICD-10-CM | POA: Insufficient documentation

## 2024-09-15 DIAGNOSIS — R739 Hyperglycemia, unspecified: Secondary | ICD-10-CM

## 2024-09-15 DIAGNOSIS — Z7982 Long term (current) use of aspirin: Secondary | ICD-10-CM | POA: Diagnosis not present

## 2024-09-15 DIAGNOSIS — Z79899 Other long term (current) drug therapy: Secondary | ICD-10-CM | POA: Diagnosis not present

## 2024-09-15 DIAGNOSIS — D72829 Elevated white blood cell count, unspecified: Secondary | ICD-10-CM | POA: Insufficient documentation

## 2024-09-15 DIAGNOSIS — R000 Tachycardia, unspecified: Secondary | ICD-10-CM | POA: Diagnosis not present

## 2024-09-15 DIAGNOSIS — R0682 Tachypnea, not elsewhere classified: Secondary | ICD-10-CM | POA: Diagnosis not present

## 2024-09-15 DIAGNOSIS — E1165 Type 2 diabetes mellitus with hyperglycemia: Secondary | ICD-10-CM | POA: Insufficient documentation

## 2024-09-15 LAB — BLOOD GAS, VENOUS
Acid-Base Excess: 3.4 mmol/L — ABNORMAL HIGH (ref 0.0–2.0)
Bicarbonate: 26.8 mmol/L (ref 20.0–28.0)
Drawn by: 74040
O2 Saturation: 26.7 %
Patient temperature: 36.4
pCO2, Ven: 35 mmHg — ABNORMAL LOW (ref 44–60)
pH, Ven: 7.49 — ABNORMAL HIGH (ref 7.25–7.43)
pO2, Ven: <31 mmHg — CL (ref 32–45)

## 2024-09-15 LAB — CBC WITH DIFFERENTIAL/PLATELET
Abs Immature Granulocytes: 0.04 K/uL (ref 0.00–0.07)
Basophils Absolute: 0.1 K/uL (ref 0.0–0.1)
Basophils Relative: 1 %
Eosinophils Absolute: 0 K/uL (ref 0.0–0.5)
Eosinophils Relative: 0 %
HCT: 40.3 % (ref 36.0–46.0)
Hemoglobin: 13.4 g/dL (ref 12.0–15.0)
Immature Granulocytes: 0 %
Lymphocytes Relative: 23 %
Lymphs Abs: 2.5 K/uL (ref 0.7–4.0)
MCH: 26.8 pg (ref 26.0–34.0)
MCHC: 33.3 g/dL (ref 30.0–36.0)
MCV: 80.6 fL (ref 80.0–100.0)
Monocytes Absolute: 0.6 K/uL (ref 0.1–1.0)
Monocytes Relative: 5 %
Neutro Abs: 7.4 K/uL (ref 1.7–7.7)
Neutrophils Relative %: 71 %
Platelets: 300 K/uL (ref 150–400)
RBC: 5 MIL/uL (ref 3.87–5.11)
RDW: 14.6 % (ref 11.5–15.5)
WBC: 10.6 K/uL — ABNORMAL HIGH (ref 4.0–10.5)
nRBC: 0 % (ref 0.0–0.2)

## 2024-09-15 LAB — COMPREHENSIVE METABOLIC PANEL WITH GFR
ALT: 24 U/L (ref 0–44)
AST: 23 U/L (ref 15–41)
Albumin: 4.3 g/dL (ref 3.5–5.0)
Alkaline Phosphatase: 122 U/L (ref 38–126)
Anion gap: 18 — ABNORMAL HIGH (ref 5–15)
BUN: 20 mg/dL (ref 6–20)
CO2: 17 mmol/L — ABNORMAL LOW (ref 22–32)
Calcium: 9.6 mg/dL (ref 8.9–10.3)
Chloride: 98 mmol/L (ref 98–111)
Creatinine, Ser: 1.13 mg/dL — ABNORMAL HIGH (ref 0.44–1.00)
GFR, Estimated: 59 mL/min — ABNORMAL LOW (ref >60)
Glucose, Bld: 262 mg/dL — ABNORMAL HIGH (ref 70–99)
Potassium: 3.9 mmol/L (ref 3.5–5.1)
Sodium: 133 mmol/L — ABNORMAL LOW (ref 135–145)
Total Bilirubin: 1 mg/dL (ref 0.0–1.2)
Total Protein: 7.5 g/dL (ref 6.5–8.1)

## 2024-09-15 LAB — URINALYSIS, ROUTINE W REFLEX MICROSCOPIC
Bacteria, UA: NONE SEEN
Bilirubin Urine: NEGATIVE
Glucose, UA: NEGATIVE mg/dL
Ketones, ur: 5 mg/dL — AB
Nitrite: NEGATIVE
Protein, ur: 30 mg/dL — AB
Specific Gravity, Urine: 1.036 — ABNORMAL HIGH (ref 1.005–1.030)
WBC, UA: >50 WBC/hpf (ref 0–5)
pH: 5 (ref 5.0–8.0)

## 2024-09-15 LAB — BASIC METABOLIC PANEL WITH GFR
Anion gap: 12 (ref 5–15)
BUN: 19 mg/dL (ref 6–20)
CO2: 22 mmol/L (ref 22–32)
Calcium: 9.2 mg/dL (ref 8.9–10.3)
Chloride: 102 mmol/L (ref 98–111)
Creatinine, Ser: 1.3 mg/dL — ABNORMAL HIGH (ref 0.44–1.00)
GFR, Estimated: 50 mL/min — ABNORMAL LOW (ref >60)
Glucose, Bld: 138 mg/dL — ABNORMAL HIGH (ref 70–99)
Potassium: 3.8 mmol/L (ref 3.5–5.1)
Sodium: 136 mmol/L (ref 135–145)

## 2024-09-15 LAB — GLUCOSE, POCT (MANUAL RESULT ENTRY): POCT Glucose (KUC): 300 mg/dL — AB (ref 70–99)

## 2024-09-15 LAB — BETA-HYDROXYBUTYRIC ACID: Beta-Hydroxybutyric Acid: 0.47 mmol/L — ABNORMAL HIGH (ref 0.05–0.27)

## 2024-09-15 LAB — LIPASE, BLOOD: Lipase: 33 U/L (ref 11–51)

## 2024-09-15 LAB — POC URINE PREG, ED: Preg Test, Ur: NEGATIVE

## 2024-09-15 LAB — CBG MONITORING, ED: Glucose-Capillary: 123 mg/dL — ABNORMAL HIGH (ref 70–99)

## 2024-09-15 MED ORDER — LACTATED RINGERS IV BOLUS: 20.0000 mL/kg | Freq: Once | INTRAVENOUS | Status: DC

## 2024-09-15 MED ORDER — MORPHINE SULFATE (PF) 4 MG/ML IV SOLN
4.0000 mg | Freq: Once | INTRAVENOUS | Status: AC
  Administered 2024-09-15: 4 mg via INTRAVENOUS
  Filled 2024-09-15: qty 1

## 2024-09-15 MED ORDER — POTASSIUM CHLORIDE 10 MEQ/100ML IV SOLN: 10.0000 meq | INTRAVENOUS | Status: DC

## 2024-09-15 MED ORDER — SODIUM CHLORIDE 0.9 % IV SOLN
1.0000 g | Freq: Once | INTRAVENOUS | Status: AC
  Administered 2024-09-15: 1 g via INTRAVENOUS
  Filled 2024-09-15: qty 10

## 2024-09-15 MED ORDER — DEXTROSE 50 % IV SOLN: 0.0000 mL | INTRAVENOUS | Status: DC | PRN

## 2024-09-15 MED ORDER — SODIUM CHLORIDE 0.9 % IV BOLUS
1000.0000 mL | Freq: Once | INTRAVENOUS | Status: AC
  Administered 2024-09-15: 1000 mL via INTRAVENOUS

## 2024-09-15 MED ORDER — LACTATED RINGERS IV SOLN: INTRAVENOUS | Status: DC

## 2024-09-15 MED ORDER — ONDANSETRON HCL 4 MG/2ML IJ SOLN
4.0000 mg | Freq: Once | INTRAMUSCULAR | Status: AC
  Administered 2024-09-15: 4 mg via INTRAVENOUS
  Filled 2024-09-15: qty 2

## 2024-09-15 MED ORDER — CEPHALEXIN 500 MG PO CAPS: 500.0000 mg | ORAL_CAPSULE | Freq: Two times a day (BID) | ORAL | 0 refills | Status: AC

## 2024-09-15 MED ORDER — IOHEXOL 300 MG/ML  SOLN
100.0000 mL | Freq: Once | INTRAMUSCULAR | Status: AC | PRN
  Administered 2024-09-15: 100 mL via INTRAVENOUS

## 2024-09-15 MED ORDER — HYDROCODONE-ACETAMINOPHEN 5-325 MG PO TABS: 1.0000 | ORAL_TABLET | Freq: Four times a day (QID) | ORAL | 0 refills | Status: AC | PRN

## 2024-09-15 MED ORDER — INSULIN REGULAR(HUMAN) IN NACL 100-0.9 UT/100ML-% IV SOLN: INTRAVENOUS | Status: DC

## 2024-09-15 MED ORDER — DEXTROSE IN LACTATED RINGERS 5 % IV SOLN: INTRAVENOUS | Status: DC

## 2024-09-15 MED ORDER — TRAMADOL HCL 50 MG PO TABS
50.0000 mg | ORAL_TABLET | Freq: Once | ORAL | Status: AC
  Administered 2024-09-15: 50 mg via ORAL
  Filled 2024-09-15: qty 1

## 2024-09-15 NOTE — ED Notes (Signed)
 Pt has been unable to provide urine sample for on site UA.

## 2024-09-15 NOTE — ED Notes (Signed)
 Provider notified of latest CBG

## 2024-09-15 NOTE — ED Notes (Signed)
 Patient is being discharged from the Urgent Care and sent to the Emergency Department via EMS . Per Harlene Code NP, patient is in need of higher level of care due to Tachycardia, Hyperglycemia and abdominal pain. Patient is aware and verbalizes understanding of plan of care.  Vitals:   09/15/24 0925  BP: (!) 142/92  Pulse: (!) 139  Resp: 20  Temp: 97.6 F (36.4 C)  SpO2: 98%

## 2024-09-15 NOTE — ED Triage Notes (Signed)
 Pt arrived via [facility] from urgent care for further evaluation of lower abdominal pain, difficulty urinating and a CBG of 300.

## 2024-09-15 NOTE — Discharge Instructions (Signed)
 We are transporting you to the ER by ambulance for further evaluation of your symptoms.

## 2024-09-15 NOTE — ED Notes (Signed)
 Patient transported to CT

## 2024-09-15 NOTE — ED Notes (Signed)
 Date and time results received: 09/15/24 1630  Test: Venous Blood Gas Critical Value: O2 <31  Name of Provider Notified: Birdena Clarity, PA-C

## 2024-09-15 NOTE — ED Notes (Addendum)
 Pt ambulated to restroom to obtain urine sample.  UPDATE: Pt unable to void at this time.
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
## 2024-09-15 NOTE — ED Triage Notes (Signed)
 Pt reports abdominal pain, diarrhea, increase Blood sugar, 228 without eating. lower back pain states she is prone to UTI's.

## 2024-09-15 NOTE — Discharge Instructions (Signed)
 Make sure you are drinking plenty of fluids to stay hydrated.  Take the entire course of your antibiotics prescribed, your next dose tomorrow morning as you have received today's dose here through your IV.  Do not drive within 4 hours of taking the medication prescribed as it will make you drowsy.  Get rechecked immediately for development of fevers, nausea vomiting, back pain or worse abdominal pain, these can be symptoms of a worsening bladder infection.

## 2024-09-15 NOTE — ED Notes (Signed)
 Gave report to charge nurse at [facility] ED.

## 2024-09-15 NOTE — ED Provider Notes (Signed)
 [facility] Provider Note   CSN: [id number] Arrival date & time: 09/15/24  1020     Patient presents with: Abdominal Pain   Yolanda [name] is a 53 y.o. female With a history significant for type 2 diabetes with DKA, hypertension, GERD, history of PID, surgical history significant for appendectomy and cholecystectomy and tubal ligation presenting for evaluation of generalized abdominal pain which started yesterday morning.  States she woke with this pain, was able to tolerate breakfast but has had no meals or other p.o. intake since.  She describes aching, pressure-like pain which radiates into her lower back which has been constant.  She denies n/v, no recognized fevers, she denies dysuria but endorses increased frequency.  She states a frequent history of UTIs which are generally asymptomatic and feel like this.  She also states she has had 3 episodes of nonbloody diarrhea since yesterday.  She denies flank pain.  Denies vaginal discharge.  She has had no treatment prior to arrival.   The history is provided by the patient.       Prior to Admission medications   Medication Sig Start Date End Date Taking? Authorizing Provider  acetaminophen  (TYLENOL ) 500 MG tablet Take 1,500 mg by mouth every 6 (six) hours as needed for mild pain (pain score 1-3).   Yes [provider]  aspirin  EC 81 MG EC tablet Take 1 tablet (81 mg total) by mouth daily. Swallow whole. 09/23/20  Yes Odell Celinda Balo, MD  atorvastatin  (LIPITOR) 40 MG tablet Take 40 mg by mouth at bedtime. 06/21/20  Yes [provider]  cephALEXin  (KEFLEX ) 500 MG capsule Take 1 capsule (500 mg total) by mouth 2 (two) times daily for 7 days. 09/15/24 09/22/24 Yes Carlin Mamone, Mliss, PA-C  COSENTYX UNOREADY 300 MG/2ML SOAJ Inject 300 mg into the skin every 30 (thirty) days. 04/14/24  Yes [provider]  HUMALOG  100 UNIT/ML injection INJECT 10-16 UNITS TOTAL (0.1 - 0.16  MLSSUBCUTANEOUSLY INTO THE SKIN 3 TIMES A DAY Patient taking differently: 10-16 Units 3 (three) times daily with meals. 03/15/21  Yes [name], Gebreselassie W, MD  HYDROcodone-acetaminophen  (NORCO/VICODIN) 5-325 MG tablet Take 1 tablet by mouth every 6 (six) hours as needed for severe pain (pain score 7-10). 09/15/24  Yes Tamir Wallman, Mliss, PA-C  LANTUS  SOLOSTAR 100 UNIT/ML Solostar Pen Inject 40 Units into the skin daily. 03/10/24  Yes [provider]  linaclotide (LINZESS) 145 MCG CAPS capsule Take 145 mcg by mouth daily as needed (constipation). 09/01/24  Yes [provider]  metFORMIN  (GLUCOPHAGE ) 1000 MG tablet Take 1 tablet (1,000 mg total) by mouth 2 (two) times daily with a meal. 09/27/20  Yes [name], Ethelle ORN, MD  metoprolol  succinate (TOPROL -XL) 50 MG 24 hr tablet Take 1.5 tablets (75 mg total) by mouth daily. Take with or immediately following a meal. Patient taking differently: Take 50 mg by mouth daily. Take with or immediately following a meal. 04/02/21 09/15/24 Yes Parthenia Olivia HERO, PA-C  omeprazole  (PRILOSEC) 40 MG capsule Take 40 mg by mouth 2 (two) times daily before a meal. 08/16/24  Yes [provider]  OZEMPIC , 2 MG/DOSE, 8 MG/3ML SOPN Inject 2 mg into the skin every Wednesday. 05/21/24  Yes [provider]  PARoxetine  (PAXIL ) 30 MG tablet Take 30 mg by mouth at bedtime. 06/21/20  Yes [provider]  potassium chloride  (KLOR-CON  M) 10 MEQ tablet Take 10 mEq by mouth daily. 03/22/24  Yes [provider]  pregabalin  (  LYRICA ) 300 MG capsule Take 300 mg by mouth every 12 (twelve) hours. 06/03/20  Yes [provider]  solifenacin  (VESICARE ) 10 MG tablet 1 by mouth daily at bedtime Patient taking differently: Take 10 mg by mouth at bedtime. 10/13/23  Yes Jayne Vonn DEL, MD  ziprasidone  (GEODON ) 80 MG capsule Take 80 mg by mouth every evening. 11/06/21  Yes [provider]  glucose blood (ONETOUCH VERIO) test strip Test glucose 5  times a day 11/10/20   [name], Gebreselassie W, MD  Insulin  Pen Needle (UNIFINE PENTIPS) 32G X 4 MM MISC USE SIX TIMES DAILY 01/28/24   [provider]    Allergies: Abilify [aripiprazole], Sulfa antibiotics, and Sulfamethoxazole-trimethoprim    Review of Systems  Constitutional:  Negative for chills and fever.  HENT:  Negative for congestion and sore throat.   Eyes: Negative.   Respiratory:  Negative for chest tightness and shortness of breath.   Cardiovascular:  Negative for chest pain.  Gastrointestinal:  Positive for abdominal pain, diarrhea and nausea. Negative for vomiting.  Genitourinary: Negative.  Negative for dysuria, flank pain and hematuria.  Musculoskeletal:  Positive for back pain. Negative for arthralgias, joint swelling and neck pain.  Skin: Negative.  Negative for rash and wound.  Neurological:  Negative for dizziness, weakness, light-headedness, numbness and headaches.  Psychiatric/Behavioral: Negative.      Updated Vital Signs BP (!) 128/98   Pulse (!) 109   Temp 97.8 F (36.6 C) (Oral)   Resp 19   Ht 5' 6 (1.676 m)   Wt 83.9 kg   SpO2 98%   BMI 29.85 kg/m   Physical Exam Vitals and nursing note reviewed.  Constitutional:      Appearance: She is well-developed.  HENT:     Head: Normocephalic and atraumatic.  Eyes:     Conjunctiva/sclera: Conjunctivae normal.  Cardiovascular:     Rate and Rhythm: Normal rate and regular rhythm.     Heart sounds: Normal heart sounds.  Pulmonary:     Effort: Pulmonary effort is normal.     Breath sounds: Normal breath sounds. No wheezing.  Abdominal:     General: Bowel sounds are normal.     Palpations: Abdomen is soft.     Tenderness: There is generalized abdominal tenderness. There is no right CVA tenderness, left CVA tenderness, guarding or rebound.  Musculoskeletal:        General: Normal range of motion.     Cervical back: Normal range of motion.  Skin:    General: Skin is warm and dry.  Neurological:      Mental Status: She is alert.     (all labs ordered are listed, but only abnormal results are displayed) Labs Reviewed  CBC WITH DIFFERENTIAL/PLATELET - Abnormal; Notable for the following components:      Result Value   WBC 10.6 (*)    All other components within normal limits  COMPREHENSIVE METABOLIC PANEL WITH GFR - Abnormal; Notable for the following components:   Sodium 133 (*)    CO2 17 (*)    Glucose, Bld 262 (*)    Creatinine, Ser 1.13 (*)    GFR, Estimated 59 (*)    Anion gap 18 (*)    All other components within normal limits  URINALYSIS, ROUTINE W REFLEX MICROSCOPIC - Abnormal; Notable for the following components:   APPearance CLOUDY (*)    Specific Gravity, Urine 1.036 (*)    Hgb urine dipstick SMALL (*)    Ketones, ur 5 (*)  Protein, ur 30 (*)    Leukocytes,Ua MODERATE (*)    Non Squamous Epithelial 0-5 (*)    All other components within normal limits  BLOOD GAS, VENOUS - Abnormal; Notable for the following components:   pH, Ven 7.49 (*)    pCO2, Ven 35 (*)    pO2, Ven <31 (*)    Acid-Base Excess 3.4 (*)    All other components within normal limits  BASIC METABOLIC PANEL WITH GFR - Abnormal; Notable for the following components:   Glucose, Bld 138 (*)    Creatinine, Ser 1.30 (*)    GFR, Estimated 50 (*)    All other components within normal limits  CBG MONITORING, ED - Abnormal; Notable for the following components:   Glucose-Capillary 123 (*)    All other components within normal limits  LIPASE, BLOOD  BETA-HYDROXYBUTYRIC ACID  POC URINE PREG, ED    EKG: None  Radiology: CT ABDOMEN PELVIS W CONTRAST Result Date: 09/15/2024 CLINICAL DATA:  Lower abdominal pain. EXAM: CT ABDOMEN AND PELVIS WITH CONTRAST TECHNIQUE: Multidetector CT imaging of the abdomen and pelvis was performed using the standard protocol following bolus administration of intravenous contrast. RADIATION DOSE REDUCTION: This exam was performed according to the departmental  dose-optimization program which includes automated exposure control, adjustment of the mA and/or kV according to patient size and/or use of iterative reconstruction technique. CONTRAST:  OMNIPAQUE  IOHEXOL  300 MG/ML  SOLN COMPARISON:  June 28, 2023 FINDINGS: Lower chest: No acute abnormality. Hepatobiliary: No focal liver abnormality is seen. Status post cholecystectomy. No biliary dilatation. Pancreas: Unremarkable. No pancreatic ductal dilatation or surrounding inflammatory changes. Spleen: Normal in size without focal abnormality. Adrenals/Urinary Tract: A stable 1.7 cm fat containing right adrenal mass is noted. The left adrenal gland is unremarkable. Kidneys are normal, without renal calculi, focal lesion, or hydronephrosis. There is mild to moderate severity diffuse urinary bladder wall thickening. Stomach/Bowel: Stomach is within normal limits. The appendix is surgically absent no evidence of bowel wall thickening, distention, or inflammatory changes. Vascular/Lymphatic: Aortic atherosclerosis. No enlarged abdominal or pelvic lymph nodes. Reproductive: Uterus and bilateral adnexa are unremarkable. Other: No abdominal wall hernia or abnormality. No abdominopelvic ascites. Musculoskeletal: Multilevel degenerative changes are seen throughout the lumbar spine. IMPRESSION: 1. Mild to moderate severity diffuse urinary bladder wall thickening which may represent sequelae associated with cystitis. Correlation with urinalysis is recommended. 2. Stable right adrenal myelolipoma. 3. Evidence of prior cholecystectomy and appendectomy. 4. Aortic atherosclerosis. Electronically Signed   By: Suzen Dials M.D.   On: 09/15/2024 13:19     Procedures   Medications Ordered in the ED  morphine  (PF) 4 MG/ML injection 4 mg (4 mg Intravenous Given 09/15/24 1116)  ondansetron  (ZOFRAN ) injection 4 mg (4 mg Intravenous Given 09/15/24 1113)  iohexol  (OMNIPAQUE ) 300 MG/ML solution 100 mL (100 mLs Intravenous Contrast  Given 09/15/24 1244)  sodium chloride  0.9 % bolus 1,000 mL (0 mLs Intravenous Stopped 09/15/24 1706)  cefTRIAXone  (ROCEPHIN ) 1 g in sodium chloride  0.9 % 100 mL IVPB (0 g Intravenous Stopped 09/15/24 1636)  traMADol (ULTRAM) tablet 50 mg (50 mg Oral Given 09/15/24 1900)                                    Medical Decision Making Patient presenting for evaluation of nausea, no vomiting, low midline abdominal pain along with urinary urgency but no dysuria and has been afebrile.  She does have  a history of frequent UTIs most recently 1 month ago.  Differential diagnosis broad including acute cystitis, pyelonephritis, other intraabdominal infection, small bowel obstruction, with the diarrhea this could also represent a colitis, viral or bacterial gastroenteritis.  She does have a history of DKA which could also be the source of her symptoms, her blood glucose today is 262, however she does have a low CO2 at 17 and an anion gap of 18.  She has had a history of DKA in the past, additional labs have been  ordered, pending beta hydroxybutyrate at this time.  Initially felt this pt would need to be admitted for dka,  however, at recheck her cbg was 123 after receiving IV fluids.  A repeat be met was obtained and her CO2 and anion gap had completely corrected with the CO2 being 22, anion gap being 12.  She was felt stable for discharge home with these repeat labs.  She was given a prescription for further antibiotics for her acute cystitis.  Also course of pain medication.  She does have some renal insufficiency chronically, deferred  Azo.  Amount and/or Complexity of Data Reviewed Labs: ordered.    Details: Labs are significant for a sodium 133, glucose of 262, CO2 17 and anion gap of 18, borderline leukocytosis with a WBC of 10.6 her urine is cloudy, small hemoglobin, moderate leukocytes greater than 50 WBCs with no bacteria.  Repeat be met and glucose significantly improved per above. Radiology: ordered.     Details: CT imaging reveals diffuse urinary bladder wall thickening suggesting possible cystitis.  No other acute abdominal findings.  Risk Prescription drug management. Decision regarding hospitalization. Risk Details: Patient was felt stable for discharge home, although strict return precautions were discussed with this patient.        Final diagnoses:  Acute cystitis with hematuria  Hyperglycemia    ED Discharge Orders          Ordered    cephALEXin  (KEFLEX ) 500 MG capsule  2 times daily        09/15/24 1903    HYDROcodone-acetaminophen  (NORCO/VICODIN) 5-325 MG tablet  Every 6 hours PRN        09/15/24 1903               Devany Aja, PA-C 09/15/24 1905    Kammerer, Megan L, DO 09/19/24 9741

## 2024-09-17 ENCOUNTER — Ambulatory Visit: Admitting: Cardiology

## 2024-10-22 ENCOUNTER — Encounter: Payer: Self-pay | Admitting: Podiatry

## 2024-10-22 ENCOUNTER — Ambulatory Visit (INDEPENDENT_AMBULATORY_CARE_PROVIDER_SITE_OTHER): Admitting: Podiatry

## 2024-10-22 DIAGNOSIS — B070 Plantar wart: Secondary | ICD-10-CM | POA: Diagnosis not present

## 2024-10-22 DIAGNOSIS — D492 Neoplasm of unspecified behavior of bone, soft tissue, and skin: Secondary | ICD-10-CM

## 2024-10-25 NOTE — Progress Notes (Signed)
 Subjective:   Patient ID: Yolanda [name], female   DOB: 53 y.o.   MRN: [id number]   HPI Patient presents with chronic pain underneath the right foot with lesion measuring approximately 8 x 8 mm that upon debridement is painful for her and has been present for a month again   ROS      Objective:  Physical Exam  Neurovascular status intact with lesion Sub right that upon debridement shows pinpoint bleeding pain to lateral pressure localized to this area     Assessment:  Verruca plantaris plantar right painful     Plan:  H&P reviewed [facility] sterile debridement of lesion accomplished applied chemical agent to create immune response with sterile dressing and instructed on what to do if blistering were to occur.  Reappoint as symptoms indicate

## 2024-11-18 ENCOUNTER — Ambulatory Visit: Admitting: Nurse Practitioner

## 2024-11-30 ENCOUNTER — Ambulatory Visit: Admitting: Nurse Practitioner

## 2024-12-10 ENCOUNTER — Encounter: Payer: Self-pay | Admitting: Nurse Practitioner

## 2025-01-06 ENCOUNTER — Other Ambulatory Visit: Payer: Self-pay | Admitting: Obstetrics & Gynecology
